# Patient Record
Sex: Male | Born: 1950 | Race: White | Hispanic: No | Marital: Married | Smoking: Former smoker
Health system: Southern US, Community
[De-identification: ages and names within clinical notes are randomized; demographics above are authoritative.]

## PROBLEM LIST (undated history)

## (undated) DIAGNOSIS — E039 Hypothyroidism, unspecified: Secondary | ICD-10-CM

## (undated) DIAGNOSIS — E7800 Pure hypercholesterolemia, unspecified: Secondary | ICD-10-CM

## (undated) DIAGNOSIS — I499 Cardiac arrhythmia, unspecified: Secondary | ICD-10-CM

## (undated) DIAGNOSIS — R209 Unspecified disturbances of skin sensation: Principal | ICD-10-CM

## (undated) DIAGNOSIS — Z9289 Personal history of other medical treatment: Secondary | ICD-10-CM

## (undated) DIAGNOSIS — G479 Sleep disorder, unspecified: Secondary | ICD-10-CM

## (undated) DIAGNOSIS — D649 Anemia, unspecified: Secondary | ICD-10-CM

## (undated) DIAGNOSIS — I10 Essential (primary) hypertension: Secondary | ICD-10-CM

## (undated) DIAGNOSIS — J449 Chronic obstructive pulmonary disease, unspecified: Secondary | ICD-10-CM

## (undated) DIAGNOSIS — I639 Cerebral infarction, unspecified: Secondary | ICD-10-CM

## (undated) HISTORY — PX: OTHER SURGICAL HISTORY: SHX169

## (undated) HISTORY — DX: Unspecified disturbances of skin sensation: R20.9

## (undated) HISTORY — PX: TONSILLECTOMY: SUR1361

## (undated) HISTORY — DX: Personal history of other medical treatment: Z92.89

## (undated) HISTORY — PX: INGUINAL HERNIA REPAIR: SUR1180

## (undated) HISTORY — DX: Pure hypercholesterolemia, unspecified: E78.00

## (undated) HISTORY — DX: Sleep disorder, unspecified: G47.9

---

## 2000-10-01 HISTORY — PX: OTHER SURGICAL HISTORY: SHX169

## 2000-11-04 ENCOUNTER — Inpatient Hospital Stay (HOSPITAL_COMMUNITY): Admission: EM | Admit: 2000-11-04 | Discharge: 2000-11-05 | Payer: Self-pay

## 2000-11-04 ENCOUNTER — Encounter: Payer: Self-pay | Admitting: Orthopedic Surgery

## 2000-11-04 ENCOUNTER — Encounter: Payer: Self-pay | Admitting: Emergency Medicine

## 2000-11-04 ENCOUNTER — Encounter: Payer: Self-pay | Admitting: General Surgery

## 2000-11-05 ENCOUNTER — Encounter: Payer: Self-pay | Admitting: Orthopedic Surgery

## 2001-01-24 ENCOUNTER — Encounter: Admission: RE | Admit: 2001-01-24 | Discharge: 2001-04-24 | Payer: Self-pay | Admitting: Orthopedic Surgery

## 2004-03-24 ENCOUNTER — Ambulatory Visit (HOSPITAL_COMMUNITY): Admission: RE | Admit: 2004-03-24 | Discharge: 2004-03-24 | Payer: Self-pay | Admitting: Gastroenterology

## 2004-05-03 ENCOUNTER — Encounter: Admission: RE | Admit: 2004-05-03 | Discharge: 2004-05-03 | Payer: Self-pay | Admitting: Gastroenterology

## 2004-07-13 ENCOUNTER — Ambulatory Visit (HOSPITAL_COMMUNITY): Admission: RE | Admit: 2004-07-13 | Discharge: 2004-07-13 | Payer: Self-pay | Admitting: General Surgery

## 2013-06-23 ENCOUNTER — Encounter: Payer: Self-pay | Admitting: Neurology

## 2013-06-24 ENCOUNTER — Ambulatory Visit (INDEPENDENT_AMBULATORY_CARE_PROVIDER_SITE_OTHER): Payer: BC Managed Care – HMO | Admitting: Neurology

## 2013-06-24 ENCOUNTER — Encounter: Payer: Self-pay | Admitting: Neurology

## 2013-06-24 VITALS — BP 141/92 | HR 63 | Ht 69.0 in | Wt 194.0 lb

## 2013-06-24 DIAGNOSIS — G459 Transient cerebral ischemic attack, unspecified: Secondary | ICD-10-CM

## 2013-06-24 DIAGNOSIS — G479 Sleep disorder, unspecified: Secondary | ICD-10-CM

## 2013-06-24 DIAGNOSIS — R209 Unspecified disturbances of skin sensation: Secondary | ICD-10-CM

## 2013-06-24 HISTORY — DX: Unspecified disturbances of skin sensation: R20.9

## 2013-06-24 HISTORY — DX: Sleep disorder, unspecified: G47.9

## 2013-06-24 NOTE — Progress Notes (Signed)
Reason for visit: Left hand numbness  Nicholas Camacho is a 62 y.o. male  History of present illness:  Nicholas Camacho is a 62 year old right-handed white male with a history of sudden onset of left hand clumsiness and numbness that occured approximately 4 weeks ago. The patient was at work at that time, carrying a canister in the left hand. The patient suddenly noted that the fourth and fifth fingers of the left hand became numb, and his hand became weak, and he dropped the canister. The patient noted that the fourth and fifth fingers were partially curled, and he could not straighten the fingers out completely. The patient denied any discomfort with this, and he has not had any numbness in the hand itself, only in the fingers. The patient denies any numbness of the forearm or arm. The patient denies any elbow pain, shoulder pain, or neck discomfort. The patient has not completely recovered, but much of the weakness and clumsiness has improved. The patient denied any other associated symptoms such as headache, visual disturbance, slurred speech, or numbness of the body or leg. The patient has had multiple episodes over the last 6 months of pulling sensations of the left side of face, and dizziness, and blurring of vision. These episodes may last 2 minutes and then clear. One of the episodes was witnessed by his wife, who noted definite facial asymmetry during the event. The patient may average 2 episodes a month. The patient is sent to this office for an evaluation. The patient has recently been placed on aspirin and a cholesterol lowering agent.  As a side note, the patient has also had a one-year history of increasing problems with excessive daytime drowsiness. The patient is unable to remain awake for long when he is inactive. The patient cannot operate a motor vehicle for long distance driving because of drowsiness. The wife has noted that he will snore at night. Although no definite apneic  episodes have been seen, the patient has been noted to have episodes of jerking at night during periods of time when his snoring stops. The snoring will then start again after the jerking episode. The excessive daytime drowsiness is worsening over time.  Past Medical History  Diagnosis Date  . Elevated cholesterol   . Disturbance of skin sensation 06/24/2013  . Sleep disturbance, unspecified 06/24/2013    Past Surgical History  Procedure Laterality Date  . Fractured pelvis    . Reconstruct left heel  2002  . Tonsillectomy    . Inguinal hernia repair Bilateral     Family History  Problem Relation Age of Onset  . Diabetes Mother   . Prostate cancer Father     Social history:  reports that he has been smoking.  He uses smokeless tobacco. He reports that he does not drink alcohol or use illicit drugs.  Medications:  Current Outpatient Prescriptions on File Prior to Visit  Medication Sig Dispense Refill  . aspirin 325 MG EC tablet Take 325 mg by mouth daily.      . Racepinephrine HCl (ASTHMANEFRIN REFILL) 2.25 % NEBU nebulizer solution Take 0.5 mLs by nebulization every 4 (four) hours as needed.       No current facility-administered medications on file prior to visit.      Allergies  Allergen Reactions  . Penicillins     Unknown   . Morphine And Related     HALLUCINATIONS    ROS:  Out of a complete 14 system review of symptoms, the  patient complains only of the following symptoms, and all other reviewed systems are negative.  Dizziness Shortness of breath, cough, wheezing, snoring Numbness  Blood pressure 141/92, pulse 63, height 5\' 9"  (1.753 m), weight 194 lb (87.998 kg).  Physical Exam  General: The patient is alert and cooperative at the time of the examination.  Head: Pupils are equal, round, and reactive to light. Discs are flat bilaterally.  Neck: The neck is supple, no carotid bruits are noted.  Respiratory: The respiratory examination is  clear.  Cardiovascular: The cardiovascular examination reveals a regular rate and rhythm, no obvious murmurs or rubs are noted.  Skin: Extremities are without significant edema.  Neurologic Exam  Mental status:  Cranial nerves: Facial symmetry is present. There is good sensation of the face to pinprick and soft touch bilaterally. The strength of the facial muscles and the muscles to head turning and shoulder shrug are normal bilaterally. Speech is well enunciated, no aphasia or dysarthria is noted. Extraocular movements are full. Visual fields are full.  Motor: The motor testing reveals 5 over 5 strength of all 4 extremities. Good symmetric motor tone is noted throughout.  Sensory: Sensory testing is intact to pinprick, soft touch, vibration sensation, and position sense on all 4 extremities. No evidence of extinction is noted.  Coordination: Cerebellar testing reveals good finger-nose-finger and heel-to-shin bilaterally.  Gait and station: Gait is normal. Tandem gait is normal. Romberg is negative. No drift is seen.  Reflexes: Deep tendon reflexes are symmetric and normal bilaterally. Toes are downgoing bilaterally.   Assessment/Plan:  1. Left hand numbness, rule out stroke event  2. Excessive daytime drowsiness  The patient had sudden onset of numbness and clumsiness of the left hand. Clearly, the pattern of numbness and clumsiness could be related to a left ulnar neuropathy, but the patient does have risk factors for stroke including dyslipidemia and smoking. The patient has also had episodes of left facial asymmetry, dizziness, and slurred speech. This in combination with the onset of the left arm deficit could be consistent with cerebrovascular disease. The patient will be set up for MRI of the brain, MRA of the head, and a carotid Doppler study. The patient will remain on aspirin. If these studies are unremarkable, the patient will be set up for EMG and nerve conduction study  involving the left arm. Given the history of increasing excessive daytime drowsiness, the patient will be referred for a sleep evaluation. The patient will followup if needed, and our office will contact him concerning the results of the studies above.  Marlan Palau MD 06/24/2013 9:01 PM  [facility] [address] [place], [place] [address]  Phone [phone] Fax [phone]

## 2013-07-02 ENCOUNTER — Ambulatory Visit (INDEPENDENT_AMBULATORY_CARE_PROVIDER_SITE_OTHER): Payer: BC Managed Care – HMO

## 2013-07-02 DIAGNOSIS — G459 Transient cerebral ischemic attack, unspecified: Secondary | ICD-10-CM

## 2013-07-02 DIAGNOSIS — R209 Unspecified disturbances of skin sensation: Secondary | ICD-10-CM

## 2013-07-02 DIAGNOSIS — G479 Sleep disorder, unspecified: Secondary | ICD-10-CM

## 2013-07-08 ENCOUNTER — Telehealth: Payer: Self-pay | Admitting: Neurology

## 2013-07-08 NOTE — Telephone Encounter (Signed)
I called patient. The carotid Doppler study was unremarkable. 

## 2013-07-09 ENCOUNTER — Ambulatory Visit (INDEPENDENT_AMBULATORY_CARE_PROVIDER_SITE_OTHER): Payer: BC Managed Care – HMO

## 2013-07-09 DIAGNOSIS — G459 Transient cerebral ischemic attack, unspecified: Secondary | ICD-10-CM

## 2013-07-09 DIAGNOSIS — G479 Sleep disorder, unspecified: Secondary | ICD-10-CM

## 2013-07-09 DIAGNOSIS — R209 Unspecified disturbances of skin sensation: Secondary | ICD-10-CM

## 2013-07-10 ENCOUNTER — Telehealth: Payer: Self-pay | Admitting: Neurology

## 2013-07-10 DIAGNOSIS — R29898 Other symptoms and signs involving the musculoskeletal system: Secondary | ICD-10-CM

## 2013-07-10 NOTE — Telephone Encounter (Signed)
I called patient. 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 MRA of the head was unremarkable. I will get nerve conduction studies set up on both arms, EMG of the left arm.

## 2013-07-14 ENCOUNTER — Telehealth: Payer: Self-pay | Admitting: Neurology

## 2013-07-14 DIAGNOSIS — Z8673 Personal history of transient ischemic attack (TIA), and cerebral infarction without residual deficits: Secondary | ICD-10-CM

## 2013-07-14 DIAGNOSIS — E669 Obesity, unspecified: Secondary | ICD-10-CM

## 2013-07-14 DIAGNOSIS — R400 Somnolence: Secondary | ICD-10-CM

## 2013-07-14 DIAGNOSIS — R0683 Snoring: Secondary | ICD-10-CM

## 2013-07-14 NOTE — Telephone Encounter (Signed)
Dr. Frances Furbish the Epworth score is 16. The sleep review is attached to the documentation below.

## 2013-07-14 NOTE — Telephone Encounter (Signed)
refers patient for attended sleep study: Dr. Anne Hahn  Height: 5'9  Weight: 194 lb  BMI: 28.64  Past Medical History: Elevated cholesterol ,Disturbance of skin sensation Sleep disturbance, unspecified    Sleep Symptoms:  The patient denied any other associated symptoms such as headache, visual disturbance, slurred speech, or numbness of the body or leg. The patient has had multiple episodes over the last 6 months of pulling sensations of the left side of face, and dizziness, and blurring of vision. These episodes may last 2 minutes and then clear. One of the episodes was witnessed by his wife, who noted definite facial asymmetry during the event. The patient may average 2 episodes a month. The patient is sent to this office for an evaluation. The patient has recently been placed on aspirin and a cholesterol lowering agent.  As a side note, the patient has also had a one-year history of increasing problems with excessive daytime drowsiness. The patient is unable to remain awake for long when he is inactive. The patient cannot operate a motor vehicle for long distance driving because of drowsiness. The wife has noted that he will snore at night. Although no definite apneic episodes have been seen, the patient has been noted to have episodes of jerking at night during periods of time when his snoring stops. The snoring will then start again after the jerking episode. The excessive daytime drowsiness is worsening over time.   Epworth Score:  Medications: Aspirin (Tablet Delayed Response) aspirin 325 MG Take 325 mg by mouth daily. Atorvastatin Calcium (Tab) LIPITOR 10 MG Take 10 mg by mouth daily. Racepinephrine HCl (Nebu Soln) Racepinephrine HCl 2.25 % Take 0.5 mLs by nebulization every 4 (four) hours as needed. .    Insurance: [employer]  Please review patient information and submit instructions for scheduling and orders for sleep technologist.  Thank you!

## 2013-07-14 NOTE — Telephone Encounter (Signed)
After reviewing the sleep study referral, I entered a split night sleep study request on this patient, thanks.  Glendale Youngblood, MD, PhD [facility] ([facility])    

## 2013-07-15 NOTE — Telephone Encounter (Signed)
I called and spoke with the patient and he has agreed to be scheduled for November 5@9 :30pm.

## 2013-08-05 ENCOUNTER — Ambulatory Visit (INDEPENDENT_AMBULATORY_CARE_PROVIDER_SITE_OTHER): Payer: BC Managed Care – HMO

## 2013-08-05 VITALS — BP 126/82 | HR 65 | Ht 70.0 in | Wt 194.0 lb

## 2013-08-05 DIAGNOSIS — R0683 Snoring: Secondary | ICD-10-CM

## 2013-08-05 DIAGNOSIS — Z8673 Personal history of transient ischemic attack (TIA), and cerebral infarction without residual deficits: Secondary | ICD-10-CM

## 2013-08-05 DIAGNOSIS — G4736 Sleep related hypoventilation in conditions classified elsewhere: Secondary | ICD-10-CM

## 2013-08-05 DIAGNOSIS — R9431 Abnormal electrocardiogram [ECG] [EKG]: Secondary | ICD-10-CM

## 2013-08-05 DIAGNOSIS — E669 Obesity, unspecified: Secondary | ICD-10-CM

## 2013-08-05 DIAGNOSIS — G4761 Periodic limb movement disorder: Secondary | ICD-10-CM

## 2013-08-05 DIAGNOSIS — R400 Somnolence: Secondary | ICD-10-CM

## 2013-08-05 DIAGNOSIS — G479 Sleep disorder, unspecified: Secondary | ICD-10-CM

## 2013-08-05 DIAGNOSIS — G4733 Obstructive sleep apnea (adult) (pediatric): Secondary | ICD-10-CM

## 2013-08-06 ENCOUNTER — Encounter (INDEPENDENT_AMBULATORY_CARE_PROVIDER_SITE_OTHER): Payer: BC Managed Care – HMO

## 2013-08-06 ENCOUNTER — Ambulatory Visit (INDEPENDENT_AMBULATORY_CARE_PROVIDER_SITE_OTHER): Payer: BC Managed Care – HMO | Admitting: Neurology

## 2013-08-06 DIAGNOSIS — R29898 Other symptoms and signs involving the musculoskeletal system: Secondary | ICD-10-CM

## 2013-08-06 DIAGNOSIS — G5602 Carpal tunnel syndrome, left upper limb: Secondary | ICD-10-CM

## 2013-08-06 DIAGNOSIS — G5601 Carpal tunnel syndrome, right upper limb: Secondary | ICD-10-CM

## 2013-08-06 DIAGNOSIS — G5600 Carpal tunnel syndrome, unspecified upper limb: Secondary | ICD-10-CM

## 2013-08-06 DIAGNOSIS — Z0289 Encounter for other administrative examinations: Secondary | ICD-10-CM

## 2013-08-06 NOTE — Progress Notes (Signed)
Nicholas Camacho is a 62 year old gentleman with a history of onset of left hand numbness and weakness that came on suddenly while at work around the beginning of September 2014. Since that time, he has had gradual improvement of his motor and sensory deficits. MRI the brain shows minimal small vessel disease, no acute changes were seen. MRA of the brain was unremarkable. Carotid Doppler studies were unremarkable. The patient is on low-dose aspirin, 325 mg daily.  The patient comes in today for nerve conduction studies on both arms, EMG on the left arm. This reveals evidence of bilateral carpal tunnel syndromes that are mild. EMG of the left arm confirms the presence of carpal tunnel syndrome, without evidence of an overlying cervical radiculopathy. No evidence of an ulnar neuropathy is seen.  The patient is doing better at this point, is almost normal with his symptoms. The patient has good strength in the left upper extremity.  The patient will followup through this office if needed. The etiology of the onset of numbness and weakness is not clear, it is possible that a small acute cerebral stroke may have been missed. The patient will remain on aspirin.

## 2013-08-06 NOTE — Procedures (Signed)
  HISTORY:  Nicholas Camacho is a 62 year old gentleman with sudden onset of left hand weakness and numbness that began in early September 2014. The patient believes that he is slowly gaining improvement with the left hand. The patient is almost back to normal this point with exception of some numbness of the left fifth finger. MRI of the brain did not show evidence of an acute stroke. The patient is being evaluated for a possible neuropathy or cervical radiculopathy.  NERVE CONDUCTION STUDIES:  Nerve conduction studies were performed on both upper extremities. The distal motor latencies for the median nerves were prolonged bilaterally, with normal motor amplitudes for those nerves. The distal motor latencies and motor amplitudes for the ulnar nerves were normal bilaterally. The F wave latencies and nerve conduction velocities for the median and ulnar nerves were normal bilaterally, with the exception that the left median F wave latency is very minimally prolonged. The sensory latencies for the median nerves were prolonged bilaterally, normal for the ulnar nerves bilaterally.  EMG STUDIES:  EMG study was performed on the left upper extremity:  The first dorsal interosseous muscle reveals 2 to 4 K units with full recruitment. No fibrillations or positive waves were noted. The abductor pollicis brevis muscle reveals 2 to 5 K units with decreased recruitment. No fibrillations or positive waves were noted. The extensor indicis proprius muscle reveals 1 to 3 K units with full recruitment. No fibrillations or positive waves were noted. The pronator teres muscle reveals 2 to 3 K units with full recruitment. No fibrillations or positive waves were noted. The biceps muscle reveals 1 to 2 K units with full recruitment. No fibrillations or positive waves were noted. The triceps muscle reveals 2 to 4 K units with full recruitment. No fibrillations or positive waves were noted. The anterior deltoid muscle  reveals 2 to 3 K units with full recruitment. No fibrillations or positive waves were noted. The cervical paraspinal muscles were tested at 2 levels. No abnormalities of insertional activity were seen at either level tested. There was good relaxation.   IMPRESSION:  Nerve conduction studies done on both upper extremities shows evidence of bilateral mild carpal tunnel syndromes. EMG evaluation of the left upper extremity shows mild chronic stable denervation in the APB muscle consistent with the diagnosis of carpal tunnel syndrome. There is no evidence of an overlying cervical radiculopathy.  Marlan Palau MD 08/06/2013 1:39 PM  [facility] [address] [place], [place] [address]  Phone [phone] Fax [phone]

## 2013-08-07 ENCOUNTER — Telehealth: Payer: Self-pay | Admitting: Neurology

## 2013-08-07 ENCOUNTER — Encounter: Payer: Self-pay | Admitting: *Deleted

## 2013-08-07 DIAGNOSIS — G4736 Sleep related hypoventilation in conditions classified elsewhere: Secondary | ICD-10-CM

## 2013-08-07 DIAGNOSIS — F17200 Nicotine dependence, unspecified, uncomplicated: Secondary | ICD-10-CM

## 2013-08-07 DIAGNOSIS — I472 Ventricular tachycardia: Secondary | ICD-10-CM

## 2013-08-07 NOTE — Telephone Encounter (Signed)
Left message for patient regarding sleep study results, asked patient to call me back to discuss results and have questions answered.  Explained that a copy of the sleep study was sent to referring physician and copy of study is coming to them in the mail.  Stated on mobile voicemail that Dr. Frances Furbish needed to see patient in follow up to discuss some of what was seen during the study (PLMD).  Also, stated that she was referring patient for cardiac and pulmonary evaluation to follow up on a couple of things.  Asked pt to call back to discuss and make appt.  Explained I would get started on referrals in the meantime.

## 2013-08-07 NOTE — Telephone Encounter (Signed)
Please call and notify the patient that the recent sleep study did not show any significant obstructive sleep apnea. However, he had low oxygen values at times and a irregularity in his EKG for which I would recommend: Cardiac evaluation and Pulmonary evaluation. I have entered a referral to cardiology and pulmonology, and would like to go over the details of the study during an appointment. Please arrange sleep consultation with me as well as initiate referrals and advise pt. Also, route or fax report to PCP and referring MD, if other than PCP.  Once you have spoken to patient, you can close this encounter.   Thanks,  Huston Foley, MD, PhD [facility])

## 2013-08-07 NOTE — Sleep Study (Signed)
See media tab for full report  

## 2013-08-12 ENCOUNTER — Encounter: Payer: Self-pay | Admitting: Neurology

## 2013-08-12 ENCOUNTER — Encounter (INDEPENDENT_AMBULATORY_CARE_PROVIDER_SITE_OTHER): Payer: Self-pay

## 2013-08-12 ENCOUNTER — Ambulatory Visit (INDEPENDENT_AMBULATORY_CARE_PROVIDER_SITE_OTHER): Payer: BC Managed Care – HMO | Admitting: Neurology

## 2013-08-12 VITALS — BP 133/80 | HR 69 | Temp 97.3°F | Ht 70.0 in | Wt 194.0 lb

## 2013-08-12 DIAGNOSIS — G479 Sleep disorder, unspecified: Secondary | ICD-10-CM

## 2013-08-12 DIAGNOSIS — F17200 Nicotine dependence, unspecified, uncomplicated: Secondary | ICD-10-CM

## 2013-08-12 DIAGNOSIS — I472 Ventricular tachycardia: Secondary | ICD-10-CM

## 2013-08-12 DIAGNOSIS — G4761 Periodic limb movement disorder: Secondary | ICD-10-CM

## 2013-08-12 DIAGNOSIS — G4736 Sleep related hypoventilation in conditions classified elsewhere: Secondary | ICD-10-CM

## 2013-08-12 NOTE — Patient Instructions (Addendum)
I can see you back as needed. If you have restless legs symptoms or new sleep related issues. Your actual obstructive sleep apnea is overall borderline. Please sleep off your back and try to lose.   Remember to drink plenty of fluid, eat healthy meals and do not skip any meals. Try to eat protein with a every meal and eat a healthy snack such as fruit or nuts in between meals. Try to keep a regular sleep-wake schedule and try to exercise daily, particularly in the form of walking, 20-30 minutes a day, if you can.   Please keep your upcoming appointments with pulmonary and cardiology.   As far as your medications are concerned, I would like to suggest no changes.   Please STOP SMOKING.

## 2013-08-12 NOTE — Progress Notes (Signed)
Subjective:    Patient ID: Nicholas Camacho is a 62 y.o. male.  HPI  Huston Foley, MD, PhD [facility] [address] [address] [place], [place] [address]  Dear Mellody Dance,  I saw your patient, Nicholas Camacho, upon your kind request in my sleep clinic today for initial consultation of his sleep disorder, in particular to discuss his recent sleep study findings. The patient is accompanied by his wife today. As you know, Nicholas Camacho is a very pleasant 62 year old right-handed gentleman with an underlying medical history of smoking, hyperlipidemia and chronic lung disease, who recently had a sleep study which I interpreted. He reports a history of snoring and daytime somnolence. He had seen you for intermittent left hand numbness, tingling and weakness. EMG and nerve conduction studies showed evidence of mild bilateral carpal tunnel syndrome. He had a baseline sleep study on 08/05/2013 and I went over his test results with him in detail today. His sleep efficiency was reduced at 85.7% with a latency to sleep of 22 minutes. Wake after sleep onset was 39.5 minutes with moderate sleep fragmentation noted. He had an increased percentage of stage I and stage II sleep, 10.2% of slow-wave sleep and the a normal percentage of REM sleep at 19.6 with a mildly reduced REM latency of 50.5 minutes. He had severe periodic leg movements at 64.6 per hour resulting and 4.9 arousals per hour. He had occasional PACs and one 9 beat run of ventricular tachycardia. He had mild intermittent snoring periods his total AHI was 3.9 per hour, rising to 11.6 per hour in rem sleep and 8 per hour in the supine position. His baseline oxygen saturation was noted to be only 90% with a nadir of 80% during REM sleep. He spent 4 hours and 1 minute below the saturation of 90% for the night.  His typical bedtime is reported to be around 1 AM and usual wake time is around 6 AM. Sleep onset typically occurs  within a few minutes. He reports feeling marginally rested upon awakening. He reports excessive daytime somnolence. The patient has been taking a scheduled nap, which is usually 2 hours long and in the evening or late afternoon.  He has been known to snore for the past few years. Snoring is reportedly mild, and associated with shallow breathing at times. The patient has not noted any RLS symptoms and is known to kick while asleep. There is no family history of RLS or OSA. He denies cataplexy, sleep paralysis, hypnagogic or hypnopompic hallucinations, or sleep attacks. He does not report any vivid dreams, nightmares, dream enactments, or parasomnias, such as sleep talking or sleep walking. He smokes about 2 ppd.   His Past Medical History Is Significant For: Past Medical History  Diagnosis Date  . Elevated cholesterol   . Disturbance of skin sensation 06/24/2013  . Sleep disturbance, unspecified 06/24/2013    His Past Surgical History Is Significant For: Past Surgical History  Procedure Laterality Date  . Fractured pelvis    . Reconstruct left heel  2002  . Tonsillectomy    . Inguinal hernia repair Bilateral     His Family History Is Significant For: Family History  Problem Relation Age of Onset  . Diabetes Mother   . Prostate cancer Father     His Social History Is Significant For: History   Social History  . Marital Status: Married    Spouse Name: N/A    Number of Children: 2  . Years of  Education: COLLEGE   Occupational History  . pest control    Social History Main Topics  . Smoking status: Current Every Day Smoker -- 1.50 packs/day  . Smokeless tobacco: Current User  . Alcohol Use: No  . Drug Use: No  . Sexual Activity: None   Other Topics Concern  . None   Social History Narrative  . None    His Allergies Are:  Allergies  Allergen Reactions  . Morphine And Related     HALLUCINATIONS  :   His Current Medications Are:  Outpatient Encounter Prescriptions  as of 08/12/2013  Medication Sig  . aspirin 325 MG EC tablet Take 325 mg by mouth daily.  [name] atorvastatin (LIPITOR) 10 MG tablet Take 10 mg by mouth daily.  [name] lisinopril (PRINIVIL,ZESTRIL) 10 MG tablet Take 1 tablet by mouth daily.  . Racepinephrine HCl (ASTHMANEFRIN REFILL) 2.25 % NEBU nebulizer solution Take 0.5 mLs by nebulization every 4 (four) hours as needed.   Review of Systems:  Out of a complete 14 point review of systems, all are reviewed and negative with the exception of these symptoms as listed below:  Review of Systems  Constitutional: Negative.   HENT: Negative.   Eyes: Negative.   Respiratory: Negative.   Cardiovascular: Negative.   Gastrointestinal: Negative.   Endocrine: Negative.   Genitourinary: Negative.   Allergic/Immunologic: Negative.   Neurological: Negative.   Hematological: Negative.   Psychiatric/Behavioral: Positive for sleep disturbance.    Objective:  Neurologic Exam  Physical Exam Physical Examination:   Filed Vitals:   08/12/13 1105  BP: 133/80  Pulse: 69  Temp: 97.3 F (36.3 C)    General Examination: The patient is a very pleasant 62 y.o. male in no acute distress. He appears well-developed and well-nourished and well groomed. He has abdominal obesity.  HEENT: Normocephalic, atraumatic, pupils are equal, round and reactive to light and accommodation. Funduscopic exam is normal with sharp disc margins noted. Extraocular tracking is good without limitation to gaze excursion or nystagmus noted. Normal smooth pursuit is noted. Hearing is grossly intact. Tympanic membranes are clear bilaterally. Face is symmetric with normal facial animation and normal facial sensation. Speech is clear with no dysarthria noted. There is no hypophonia. There is no lip, neck/head, jaw or voice tremor. Neck is supple with full range of passive and active motion. There are no carotid bruits on auscultation. Oropharynx exam reveals: mild mouth dryness, dentures in  place and mild airway crowding. Mallampati is class I. Tongue protrudes centrally and palate elevates symmetrically. Tonsils are absent.    Chest: Clear to auscultation without wheezing, rhonchi or crackles noted. He has several shorter bouts of cough during this visit.  Heart: S1+S2+0, regular and normal without murmurs, rubs or gallops noted.   Abdomen: Soft, non-tender and non-distended with normal bowel sounds appreciated on auscultation.  Extremities: There is no pitting edema in the distal lower extremities bilaterally. Pedal pulses are intact.  Skin: Warm and dry without trophic changes noted. There are no varicose veins.  Musculoskeletal: exam reveals no obvious joint deformities, tenderness or joint swelling or erythema.   Neurologically:  Mental status: The patient is awake, alert and oriented in all 4 spheres. His memory, attention, language and knowledge are appropriate. There is no aphasia, agnosia, apraxia or anomia. Speech is clear with normal prosody and enunciation. Thought process is linear. Mood is congruent and affect is normal.  Cranial nerves are as described above under HEENT exam. In addition, shoulder shrug is normal with  equal shoulder height noted. Motor exam: Normal bulk, strength and tone is noted. There is no drift, tremor or rebound. Romberg is negative. Reflexes are 2+ throughout. Fine motor skills are intact with normal finger taps, normal hand movements, normal rapid alternating patting, normal foot taps and normal foot agility.  Cerebellar testing shows no dysmetria or intention tremor on finger to nose testing. Heel to shin is unremarkable bilaterally. There is no truncal or gait ataxia.  Sensory exam is intact to light touch, pinprick, vibration, temperature sense in the upper and lower extremities; he reports tingling in his left digit 5.  Gait, station and balance are unremarkable. No veering to one side is noted. No leaning to one side is noted. Posture is  age-appropriate and stance is narrow based. No problems turning are noted. He turns en bloc.                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 He had a short run of V tach and severe PLMs with very mild PLM associated arousals noted. He denies RLS Sx. He was noted to have several bouts of cough during the night of his sleep study and during this visit. His physical exam is non-focal. From the sleep apnea standpoint I have asked him to sleep off his back and try to lose some weight. Furthermore, since he does not have any restless leg symptoms to speak of and his PLM associated arousal index was lower than 5 per hour I suggested that we not initiate any treatment for PLMD. I would like for him to see a pulmonologist for concern for underlying lung disease and this may in fact be the reason for his hypoxemia and sleep. In addition I have requested that he see a cardiologist for abnormalities seen on his EKG and including a short run of V. tach. He had no cardiac symptoms at the time of a sleep study and does not endorse much in the way of cardiac related symptoms. He does have coughing with exertion including climbing stairs. His biggest cardiovascular risk factor is his smoking and I have advised him very strongly to quit smoking. At this juncture from my end of things I will see him back on an as-needed basis. He and his wife were in agreement. Thank you very much for allowing me to participate in the care of this nice patient. If I can be of any further assistance to you please do not hesitate to call me at [phone].  Sincerely,   Huston Foley, MD, PhD

## 2013-09-11 ENCOUNTER — Encounter: Payer: Self-pay | Admitting: *Deleted

## 2013-09-15 ENCOUNTER — Ambulatory Visit (INDEPENDENT_AMBULATORY_CARE_PROVIDER_SITE_OTHER): Payer: BC Managed Care – PPO | Admitting: Pulmonary Disease

## 2013-09-15 ENCOUNTER — Encounter: Payer: Self-pay | Admitting: Pulmonary Disease

## 2013-09-15 ENCOUNTER — Ambulatory Visit (INDEPENDENT_AMBULATORY_CARE_PROVIDER_SITE_OTHER)
Admission: RE | Admit: 2013-09-15 | Discharge: 2013-09-15 | Disposition: A | Discharge status: Home / Self Care | Payer: BC Managed Care – PPO | Source: Ambulatory Visit | Attending: Pulmonary Disease | Admitting: Pulmonary Disease

## 2013-09-15 ENCOUNTER — Encounter: Payer: Self-pay | Admitting: Cardiology

## 2013-09-15 ENCOUNTER — Ambulatory Visit (INDEPENDENT_AMBULATORY_CARE_PROVIDER_SITE_OTHER): Payer: BC Managed Care – PPO | Admitting: Cardiology

## 2013-09-15 VITALS — BP 132/82 | HR 70 | Temp 97.9°F | Ht 70.0 in | Wt 201.4 lb

## 2013-09-15 VITALS — BP 124/84 | HR 68 | Ht 70.0 in | Wt 197.0 lb

## 2013-09-15 DIAGNOSIS — I10 Essential (primary) hypertension: Secondary | ICD-10-CM

## 2013-09-15 DIAGNOSIS — I4891 Unspecified atrial fibrillation: Secondary | ICD-10-CM

## 2013-09-15 DIAGNOSIS — G459 Transient cerebral ischemic attack, unspecified: Secondary | ICD-10-CM

## 2013-09-15 DIAGNOSIS — J449 Chronic obstructive pulmonary disease, unspecified: Secondary | ICD-10-CM

## 2013-09-15 DIAGNOSIS — E785 Hyperlipidemia, unspecified: Secondary | ICD-10-CM

## 2013-09-15 DIAGNOSIS — I639 Cerebral infarction, unspecified: Secondary | ICD-10-CM

## 2013-09-15 DIAGNOSIS — I6350 Cerebral infarction due to unspecified occlusion or stenosis of unspecified cerebral artery: Secondary | ICD-10-CM

## 2013-09-15 MED ORDER — ALBUTEROL SULFATE HFA 108 (90 BASE) MCG/ACT IN AERS: 2.0000 | INHALATION_SPRAY | Freq: Four times a day (QID) | RESPIRATORY_TRACT | Status: DC | PRN

## 2013-09-15 MED ORDER — FLUTICASONE-SALMETEROL 250-50 MCG/DOSE IN AEPB: 2.0000 | INHALATION_SPRAY | Freq: Two times a day (BID) | RESPIRATORY_TRACT | Status: DC

## 2013-09-15 NOTE — Progress Notes (Signed)
   Subjective:    Patient ID: Nicholas Camacho, male    DOB: 17-Dec-1950, 62 y.o.   MRN: [id number]  HPI The patient is a 62 year old male who I've been asked to see for nocturnal hypoxemia. He recently underwent a sleep study that showed no significant sleep apnea, but he did have oxygen desaturation as low as 80%. He spent 4 hours during the night less than 90%, and 1.5 hours less than 88%. He has a long history of tobacco abuse, and continues to do so. He describes a 5 block dyspnea on exertion at a moderate pace on flat ground. He will get short of breath walking up one flight of stairs, but has no issues bringing groceries in from the car. He has a chronic dry cough, but does produce white foamy mucus in the mornings. He denies any significant flareups of his lung disease, but does note worsening shortness of breath during the high heat and humidity of the summer. He has not had a recent chest x-ray, and has never had pulmonary function studies.   Review of Systems  Constitutional: Negative for fever and unexpected weight change.  HENT: Positive for sinus pressure. Negative for congestion, dental problem, ear pain, nosebleeds, postnasal drip, rhinorrhea, sneezing, sore throat and trouble swallowing.   Eyes: Negative for redness and itching.  Respiratory: Positive for cough and shortness of breath. Negative for chest tightness and wheezing.   Cardiovascular: Negative for palpitations and leg swelling.  Gastrointestinal: Negative for nausea and vomiting.  Genitourinary: Negative for dysuria.  Musculoskeletal: Negative for joint swelling.  Skin: Negative for rash.  Neurological: Negative for headaches.  Hematological: Does not bruise/bleed easily.  Psychiatric/Behavioral: Negative for dysphoric mood. The patient is not nervous/anxious.        Objective:   Physical Exam Constitutional:  Well developed, no acute distress  HENT:  Nares patent without discharge  Oropharynx without exudate,  palate and uvula are normal  Eyes:  Perrla, eomi, no scleral icterus  Neck:  No JVD, no TMG  Cardiovascular:  Normal rate, regular rhythm, no rubs or gallops.  No murmurs        Intact distal pulses  Pulmonary :  decreased breath sounds, no stridor or respiratory distress   No rales, or wheezing.  +rhonchi  Abdominal:  Soft, nondistended, bowel sounds present.  No tenderness noted.   Musculoskeletal:  No lower extremity edema noted.  Lymph Nodes:  No cervical lymphadenopathy noted  Skin:  No cyanosis noted  Neurologic:  Alert, appropriate, moves all 4 extremities without obvious deficit.         Assessment & Plan:

## 2013-09-15 NOTE — Assessment & Plan Note (Signed)
The patient's history and exam is most consistent with significant COPD. It is unclear how much this may be related to airway inflammation from his ongoing smoking, versus true emphysema. He will obviously need full PFTs for evaluation, and as well as a chest x-ray. I would like to start him on a maintenance bronchodilator while he is trying to work on smoking cessation, and will see him back in 4 weeks with pulmonary function studies. I have also discussed his nocturnal hypoxemia with him, and have recommended starting oxygen at night. The patient is hesitant to do this, and I made a deal with him to give him 4-8 weeks on treatment and smoking cessation to see if things improve.

## 2013-09-15 NOTE — Progress Notes (Signed)
Patient ID: LINDBERGH WINKLES, male   DOB: 02-Apr-1951, 63 y.o.   MRN: [id number]    Patient Name: DALBERT STILLINGS Date of Encounter: 09/15/2013  Primary Care Provider:  Farris Has, MD Primary Cardiologist:  Tobias Alexander, H  Problem List   Past Medical History  Diagnosis Date  . Elevated cholesterol   . Disturbance of skin sensation 06/24/2013  . Sleep disturbance, unspecified 06/24/2013   Past Surgical History  Procedure Laterality Date  . Fractured pelvis    . Reconstruct left heel  2002  . Tonsillectomy    . Inguinal hernia repair Bilateral     Allergies  Allergies  Allergen Reactions  . Morphine And Related     HALLUCINATIONS    HPI  A 62 year old male with h/o hypertension, hyperlipidemia, COPD and recent intermittent left hand numbness, tingling and weakness with ? TIA. CT and MRI of the brain were normal.EMG and nerve conduction studies showed evidence of mild bilateral carpal tunnel syndrome. He was also evaluated for OSA and was told that he had a short run of atrial fibrillation. The patient is not aware of palpitations and denies any prior syncope, CP or SOB. He is ongoing smoker.  Home Medications  Prior to Admission medications   Medication Sig Start Date End Date Taking? Authorizing Provider  aspirin 325 MG EC tablet Take 325 mg by mouth daily.   Yes Historical Provider, MD  lisinopril (PRINIVIL,ZESTRIL) 10 MG tablet Take 1 tablet by mouth daily. 07/28/13  Yes Historical Provider, MD  Racepinephrine HCl (ASTHMANEFRIN REFILL) 2.25 % NEBU nebulizer solution Take 0.5 mLs by nebulization every 4 (four) hours as needed.   Yes Historical Provider, MD  atorvastatin (LIPITOR) 10 MG tablet Take 10 mg by mouth daily. 06/12/13   Historical Provider, MD    Family History  Family History  Problem Relation Age of Onset  . Diabetes Mother   . Prostate cancer Father   . Kidney failure Father   . Alcoholism Sister     history of  . Drug abuse Sister    history of    Social History  History   Social History  . Marital Status: Married    Spouse Name: N/A    Number of Children: 2  . Years of Education: COLLEGE   Occupational History  . pest control    Social History Main Topics  . Smoking status: Current Every Day Smoker -- 1.50 packs/day  . Smokeless tobacco: Current User  . Alcohol Use: No  . Drug Use: No  . Sexual Activity: Not on file   Other Topics Concern  . Not on file   Social History Narrative  . No narrative on file     Review of Systems, as per HPI, otherwise negative General:  No chills, fever, night sweats or weight changes.  Cardiovascular:  No chest pain, dyspnea on exertion, edema, orthopnea, palpitations, paroxysmal nocturnal dyspnea. Dermatological: No rash, lesions/masses Respiratory: No cough, dyspnea Urologic: No hematuria, dysuria Abdominal:   No nausea, vomiting, diarrhea, bright red blood per rectum, melena, or hematemesis Neurologic:  No visual changes, wkns, changes in mental status. All other systems reviewed and are otherwise negative except as noted above.  Physical Exam  Blood pressure 124/84, pulse 68, height 5\' 10"  (1.778 m), weight 197 lb (89.359 kg).  General: Pleasant, NAD Psych: Normal affect. Neuro: Alert and oriented X 3. Moves all extremities spontaneously. HEENT: Normal  Neck: Supple without bruits or JVD. Lungs:  Resp regular and  unlabored, CTA. Heart: RRR no s3, s4, or murmurs. Abdomen: Soft, non-tender, non-distended, BS + x 4.  Extremities: No clubbing, cyanosis or edema. DP/PT/Radials 2+ and equal bilaterally.  Labs:  No results found for this basename: CKTOTAL, CKMB, TROPONINI,  in the last 72 hours No results found for this basename: WBC, HGB, HCT, MCV, PLT   No results found for this basename: NA, K, CL, CO2, BUN, CREATININE, CALCIUM, LABALBU, PROT, BILITOT, ALKPHOS, ALT, AST, GLUCOSE,  in the last 168 hours No results found for this basename: CHOL, HDL, LDLCALC,  TRIG   No results found for this basename: DDIMER   No components found with this basename: POCBNP,   Accessory Clinical Findings  echocardiogram  ECG - normal sinus rhythm, rightward axis, incomplete right bundle branch block, borderline EKG   Assessment & Plan  A 62 year male   1. Upper extremity tingling, weakness - ? TIA, normal MRI, nsVT found incidentally during sleep study. We will start a 48 Hour Holter monitor to evaluate for possible a-fib, and order an echocardiogram (including bubble study) to evaluate for LA size and LV function.  If Holter monitor negative we will consider a TEE and plan for implantable loop recorder.   2. COPD with active wheezing - the patient is currently only on rescue medication - Albuterol that is beta-agonist and might be a-fib trigger. We will start Advair 250/50 as a base therapy to avoid/minimize use of albuterol.  3.OSA - recommended weight loss and follow up  4. Hypertension - controlled  5. Hyperlipidemia - followed by PCP, on atorvastatin 10 mh po daily   F/u in 2weeks    Tobias Alexander, Rexene Edison, MD, [facility] 09/15/2013, 10:17 AM

## 2013-09-15 NOTE — Patient Instructions (Addendum)
Your physician has requested that you have an echocardiogram. Echocardiography is a painless test that uses sound waves to create images of your heart. It provides your doctor with information about the size and shape of your heart and how well your heart's chambers and valves are working. This procedure takes approximately one hour. There are no restrictions for this procedure.    Schedule 48 hour holter monitor     Your physician recommends that you schedule a follow-up appointment in: 2 weeks    Start Advair Inhaler 2 puffs twice a day  Start Albuterol inhaler 2 puffs every 6 hours as needed

## 2013-09-15 NOTE — Patient Instructions (Signed)
Will start on breo one inhalation each am everyday whether you need or not.  Rinse mouth well. Do not pick up advair Ok to pick up albuterol inhaler, and can use 2 puffs every 6hrs if needed for rescue Do NOT use over the counter inhalers ever! Stop smoking. Will hold off on oxygen at night, and will recheck in a month or so after you have been on medications and decreaseed or stopped smoking. Will schedule for breathing studies in 4 weeks, and see you back same day to review.

## 2013-09-16 ENCOUNTER — Encounter (INDEPENDENT_AMBULATORY_CARE_PROVIDER_SITE_OTHER): Payer: BC Managed Care – PPO

## 2013-09-16 ENCOUNTER — Encounter: Payer: Self-pay | Admitting: *Deleted

## 2013-09-16 DIAGNOSIS — I39 Endocarditis and heart valve disorders in diseases classified elsewhere: Secondary | ICD-10-CM

## 2013-09-16 DIAGNOSIS — I639 Cerebral infarction, unspecified: Secondary | ICD-10-CM

## 2013-09-16 DIAGNOSIS — I4891 Unspecified atrial fibrillation: Secondary | ICD-10-CM

## 2013-09-16 NOTE — Progress Notes (Signed)
Patient ID: Nicholas Camacho, male   DOB: 01/18/1951, 62 y.o.   MRN: [id number] E-Card[employer] 48 hour holter monitor applied to patient. 

## 2013-09-17 ENCOUNTER — Telehealth: Payer: Self-pay | Admitting: Pulmonary Disease

## 2013-09-17 DIAGNOSIS — E785 Hyperlipidemia, unspecified: Secondary | ICD-10-CM | POA: Insufficient documentation

## 2013-09-17 DIAGNOSIS — I10 Essential (primary) hypertension: Secondary | ICD-10-CM | POA: Insufficient documentation

## 2013-09-17 DIAGNOSIS — G459 Transient cerebral ischemic attack, unspecified: Secondary | ICD-10-CM | POA: Insufficient documentation

## 2013-09-17 NOTE — Telephone Encounter (Signed)
Notes Recorded by Barbaraann Share, MD on 09/15/2013 at 5:28 PM Please let pt know that cxr looks fine.   I spoke with patient about results and he verbalized understanding and had no questions

## 2013-09-17 NOTE — Progress Notes (Signed)
Quick Note:  lmomtcb on pt's home and cell #s ______ 

## 2013-09-28 ENCOUNTER — Ambulatory Visit (HOSPITAL_COMMUNITY)
Admission: RE | Admit: 2013-09-28 | Discharge: 2013-09-28 | Disposition: A | Discharge status: Home / Self Care | Payer: BC Managed Care – PPO | Source: Ambulatory Visit | Attending: Cardiology | Admitting: Cardiology

## 2013-09-28 VITALS — BP 123/76 | HR 82 | Resp 18

## 2013-09-28 DIAGNOSIS — E785 Hyperlipidemia, unspecified: Secondary | ICD-10-CM

## 2013-09-28 DIAGNOSIS — J449 Chronic obstructive pulmonary disease, unspecified: Secondary | ICD-10-CM

## 2013-09-28 DIAGNOSIS — I359 Nonrheumatic aortic valve disorder, unspecified: Secondary | ICD-10-CM

## 2013-09-28 DIAGNOSIS — I639 Cerebral infarction, unspecified: Secondary | ICD-10-CM

## 2013-09-28 DIAGNOSIS — I4891 Unspecified atrial fibrillation: Secondary | ICD-10-CM

## 2013-09-28 DIAGNOSIS — Z8673 Personal history of transient ischemic attack (TIA), and cerebral infarction without residual deficits: Secondary | ICD-10-CM | POA: Insufficient documentation

## 2013-09-28 DIAGNOSIS — I10 Essential (primary) hypertension: Secondary | ICD-10-CM

## 2013-09-28 NOTE — Progress Notes (Signed)
2D Echocardiogram with bubble study has been performed.  Nicholas Camacho 09/28/2013, 12:02 PM

## 2013-09-29 ENCOUNTER — Encounter: Payer: Self-pay | Admitting: Cardiology

## 2013-09-29 ENCOUNTER — Ambulatory Visit (INDEPENDENT_AMBULATORY_CARE_PROVIDER_SITE_OTHER): Payer: BC Managed Care – PPO | Admitting: Cardiology

## 2013-09-29 VITALS — BP 116/78 | HR 64 | Ht 70.0 in | Wt 196.8 lb

## 2013-09-29 DIAGNOSIS — Z7901 Long term (current) use of anticoagulants: Secondary | ICD-10-CM

## 2013-09-29 DIAGNOSIS — I4891 Unspecified atrial fibrillation: Secondary | ICD-10-CM | POA: Insufficient documentation

## 2013-09-29 LAB — CBC
HCT: 42.6 % (ref 39.0–52.0)
Hemoglobin: 14.2 g/dL (ref 13.0–17.0)
MCHC: 33.2 g/dL (ref 30.0–36.0)
MCV: 89.4 fl (ref 78.0–100.0)
Platelets: 236 10*3/uL (ref 150.0–400.0)
RBC: 4.77 Mil/uL (ref 4.22–5.81)
RDW: 14 % (ref 11.5–14.6)
WBC: 7 10*3/uL (ref 4.5–10.5)

## 2013-09-29 LAB — BASIC METABOLIC PANEL
BUN: 17 mg/dL (ref 6–23)
CO2: 27 mEq/L (ref 19–32)
Calcium: 9.1 mg/dL (ref 8.4–10.5)
Chloride: 104 mEq/L (ref 96–112)
Creatinine, Ser: 1.1 mg/dL (ref 0.4–1.5)
GFR: 71.4 mL/min (ref >60.00)
Glucose, Bld: 99 mg/dL (ref 70–99)
Potassium: 4.2 mEq/L (ref 3.5–5.1)
Sodium: 137 mEq/L (ref 135–145)

## 2013-09-29 MED ORDER — DILTIAZEM HCL ER COATED BEADS 120 MG PO CP24: 120.0000 mg | ORAL_CAPSULE | Freq: Every day | ORAL | Status: DC

## 2013-09-29 NOTE — Progress Notes (Signed)
Patient ID: Nicholas Camacho, male   DOB: 06/27/1951, 62 y.o.   MRN: [id number]    Patient Name: Nicholas Camacho Date of Encounter: 09/29/2013  Primary Care Provider:  Farris Has, MD Primary Cardiologist:  Tobias Alexander, H  Problem List   Past Medical History  Diagnosis Date  . Elevated cholesterol   . Disturbance of skin sensation 06/24/2013  . Sleep disturbance, unspecified 06/24/2013   Past Surgical History  Procedure Laterality Date  . Fractured pelvis    . Reconstruct left heel  2002  . Tonsillectomy    . Inguinal hernia repair Bilateral     Allergies  Allergies  Allergen Reactions  . Morphine And Related     HALLUCINATIONS    HPI  A 62 year old male with h/o hypertension, hyperlipidemia, COPD and recent intermittent left hand numbness, tingling and weakness with ? TIA. CT and MRI of the brain were normal.EMG and nerve conduction studies showed evidence of mild bilateral carpal tunnel syndrome. He was also evaluated for OSA and was told that he had a short run of atrial fibrillation. The patient is not aware of palpitations and denies any prior syncope, CP or SOB. He is ongoing smoker.  Home Medications  Prior to Admission medications   Medication Sig Start Date End Date Taking? Authorizing Provider  aspirin 325 MG EC tablet Take 325 mg by mouth daily.   Yes Historical Provider, MD  lisinopril (PRINIVIL,ZESTRIL) 10 MG tablet Take 1 tablet by mouth daily. 07/28/13  Yes Historical Provider, MD  Racepinephrine HCl (ASTHMANEFRIN REFILL) 2.25 % NEBU nebulizer solution Take 0.5 mLs by nebulization every 4 (four) hours as needed.   Yes Historical Provider, MD  atorvastatin (LIPITOR) 10 MG tablet Take 10 mg by mouth daily. 06/12/13   Historical Provider, MD    Family History  Family History  Problem Relation Age of Onset  . Diabetes Mother   . Prostate cancer Father   . Kidney failure Father   . Alcoholism Sister     history of  . Drug abuse Sister    history of    Social History  History   Social History  . Marital Status: Married    Spouse Name: N/A    Number of Children: 2  . Years of Education: COLLEGE   Occupational History  . pest control    Social History Main Topics  . Smoking status: Current Every Day Smoker -- 1.50 packs/day for 45 years    Types: Cigarettes  . Smokeless tobacco: Current User  . Alcohol Use: No  . Drug Use: No  . Sexual Activity: Not on file   Other Topics Concern  . Not on file   Social History Narrative  . No narrative on file     Review of Systems, as per HPI, otherwise negative General:  No chills, fever, night sweats or weight changes.  Cardiovascular:  No chest pain, dyspnea on exertion, edema, orthopnea, palpitations, paroxysmal nocturnal dyspnea. Dermatological: No rash, lesions/masses Respiratory: No cough, dyspnea Urologic: No hematuria, dysuria Abdominal:   No nausea, vomiting, diarrhea, bright red blood per rectum, melena, or hematemesis Neurologic:  No visual changes, wkns, changes in mental status. All other systems reviewed and are otherwise negative except as noted above.  Physical Exam  Blood pressure 116/78, pulse 64, height 5\' 10"  (1.778 m), weight 196 lb 12.8 oz (89.268 kg).  General: Pleasant, NAD Psych: Normal affect. Neuro: Alert and oriented X 3. Moves all extremities spontaneously. HEENT: Normal  Neck:  Supple without bruits or JVD. Lungs:  Resp regular and unlabored, CTA. Heart: RRR no s3, s4, or murmurs. Abdomen: Soft, non-tender, non-distended, BS + x 4.  Extremities: No clubbing, cyanosis or edema. DP/PT/Radials 2+ and equal bilaterally.  Labs:  No results found for this basename: CKTOTAL, CKMB, TROPONINI,  in the last 72 hours No results found for this basename: WBC,  HGB,  HCT,  MCV,  PLT   No results found for this basename: NA, K, CL, CO2, BUN, CREATININE, CALCIUM, LABALBU, PROT, BILITOT, ALKPHOS, ALT, AST, GLUCOSE,  in the last 168 hours No  results found for this basename: CHOL,  HDL,  LDLCALC,  TRIG   No results found for this basename: DDIMER   No components found with this basename: POCBNP,   Accessory Clinical Findings  Echocardiogram - 09/28/2013 Study Conclusions  - Left ventricle: The cavity size was normal. Systolic function was normal. The estimated ejection fraction was in the range of 60% to 65%. Wall motion was normal; there were no regional wall motion abnormalities. - Aortic valve: Mild regurgitation. - Atrial septum: There was increased thickness of the septum, consistent with lipomatous hypertrophy.  ECG - normal sinus rhythm, rightward axis, incomplete right bundle branch block, borderline EKG    Assessment & Plan  A 62 year male   1. Upper extremity tingling, weakness - most probably carpal tunnel syndrome, normal MRI, a-fib found incidentally during sleep study. A 48 Hour Holter monitor showed 12 short episodes of atrial fibrillation, the longest was 21 beats, echo showed normal LVEF and normal LA size.  CHADS-VASc 2, we will start Xarelto. Start cardizem 120 mg po QD ( we will have to discontinue lisinopril),   Follow up in 2 months with repeated 48 hour holter.  2. COPD improved wheezing - the patient was started on long term inhaled steroid by a pulmonologist.    3. OSA - recommended weight loss and follow up  4. Hypertension - controlled, switch lisinopril 10 mg to cardizem CD 120 mg PO daily.  5. Hyperlipidemia - followed by PCP, on atorvastatin 10 mg po daily   F/u in 2 months with repeated 48 H holter    Lars Masson, MD, [facility] 09/29/2013, 8:45 AM

## 2013-09-29 NOTE — Addendum Note (Signed)
Addended by: Migdalia Dk on: 09/29/2013 09:20 AM   Modules accepted: Orders

## 2013-09-29 NOTE — Patient Instructions (Signed)
Start Xarelto   Stop Lisinopril    Start Cardizem CD 120 mg daily    Your physician recommends that you schedule a follow-up appointment in: 2 months with a 48 hour Holter Monitor

## 2013-09-29 NOTE — Addendum Note (Signed)
Addended by: Meda Klinefelter D on: 09/29/2013 10:28 AM   Modules accepted: Orders, Medications

## 2013-09-30 ENCOUNTER — Telehealth: Payer: Self-pay

## 2013-09-30 MED ORDER — RIVAROXABAN 20 MG PO TABS: 20.0000 mg | ORAL_TABLET | Freq: Every day | ORAL | Status: DC

## 2013-09-30 NOTE — Telephone Encounter (Signed)
Spoke with pt's wife made aware labwork WNL.  Pt is to start taking Xarelto 20mg  daily with evening meal.  Advised to have pt monitor for any signs of abnormal bleeding and contact office if occurs. Pt's wife verbalized understanding.  Rx sent electronically to pharmacy as requested.   Will forward telephone note to Dr Delton See.  Pt is on 325mg  ASA daily, since pt is starting Xarelto do you want him to continue on same dosage or do you want to decrease ASA to 81mg  or stop all together?  Please advise, Thanks.

## 2013-09-30 NOTE — Telephone Encounter (Signed)
Pt saw Dr Delton See yesterday, pt was to start on Xarelto dosed by Coumadin clinic.  Pt did not have baseline labwork BMP or CBC.  Labs drawn on 09/29/13 and reviewed.  Bun 17/Creat 1.1, Wt. 89kg, age 62 based on these values pt's Creatine Clearance is 88.78.  Pt should start taking Xarelto 20mg  once daily with evening meal.

## 2013-10-02 ENCOUNTER — Telehealth: Payer: Self-pay | Admitting: Cardiology

## 2013-10-02 NOTE — Telephone Encounter (Signed)
Called having questions regarding 2 of his new medications.  He received Xarelto and wanted to know if Lisinopril was replaced by Cardizem.  Advised that according to [name] note he was to stop Lisinopril and start Cardizem.  He does have Cardizem and advised to start taking that medication.  He verbalizes understanding and will start today with new medications.  Will call if has any further questions.

## 2013-10-02 NOTE — Telephone Encounter (Signed)
New Message  Pt called states that he has two new prescriptions and he believes that one is replacing the other//No further details would rather discuss further with the nurse. Requests a call back for clarification.

## 2013-10-05 NOTE — Telephone Encounter (Signed)
Hi Larita FifeLynn,  Could you call him to take aspirin 81 mg po daily instead of 325 mg po daily? Thank you, Aris LotKatarina

## 2013-10-07 ENCOUNTER — Telehealth: Payer: Self-pay | Admitting: Cardiology

## 2013-10-07 NOTE — Telephone Encounter (Signed)
New message ° ° ° ° ° ° ° ° ° °Pt returning nurses call °

## 2013-10-07 NOTE — Telephone Encounter (Signed)
Spoke with pt aware to stop 325mg  ASA and start 81mg  ASA daily.  Pt states [facility] needs clarification on Proair rx sent in by Dr Delton SeeNelson.  Reference # [id number] please call [phone] option 2 and advise.  Thanks.

## 2013-10-07 NOTE — Telephone Encounter (Addendum)
I called [facility] but was unable to speak to a pharmacist nor could I understand what is needed for pt to receive his meds . I was advised to call back and speak to a pharmacist at [phone]. I called back and spoke with Juevence, an [facility] representative, who states that he is going to fax over the form needed to fill the pts medications. Pt aware that I have contacted [facility] concerning his medications.

## 2013-10-12 ENCOUNTER — Other Ambulatory Visit: Payer: Self-pay | Admitting: Family Medicine

## 2013-10-12 DIAGNOSIS — Z1211 Encounter for screening for malignant neoplasm of colon: Secondary | ICD-10-CM

## 2013-10-13 ENCOUNTER — Other Ambulatory Visit: Payer: Self-pay

## 2013-10-13 MED ORDER — RIVAROXABAN 20 MG PO TABS: 20.0000 mg | ORAL_TABLET | Freq: Every day | ORAL | Status: DC

## 2013-10-13 MED ORDER — ALBUTEROL SULFATE HFA 108 (90 BASE) MCG/ACT IN AERS: 2.0000 | INHALATION_SPRAY | Freq: Four times a day (QID) | RESPIRATORY_TRACT | Status: DC | PRN

## 2013-10-22 ENCOUNTER — Other Ambulatory Visit: Payer: Self-pay

## 2013-10-22 MED ORDER — DILTIAZEM HCL ER COATED BEADS 120 MG PO CP24: 120.0000 mg | ORAL_CAPSULE | Freq: Every day | ORAL | Status: DC

## 2013-10-23 ENCOUNTER — Ambulatory Visit (INDEPENDENT_AMBULATORY_CARE_PROVIDER_SITE_OTHER): Payer: BC Managed Care – PPO | Admitting: Pulmonary Disease

## 2013-10-23 ENCOUNTER — Encounter: Payer: Self-pay | Admitting: Pulmonary Disease

## 2013-10-23 ENCOUNTER — Other Ambulatory Visit: Payer: Self-pay | Admitting: Pulmonary Disease

## 2013-10-23 ENCOUNTER — Encounter (INDEPENDENT_AMBULATORY_CARE_PROVIDER_SITE_OTHER): Payer: Self-pay

## 2013-10-23 VITALS — BP 132/72 | HR 69 | Temp 97.5°F | Ht 68.0 in | Wt 198.0 lb

## 2013-10-23 DIAGNOSIS — J449 Chronic obstructive pulmonary disease, unspecified: Secondary | ICD-10-CM

## 2013-10-23 LAB — PULMONARY FUNCTION TEST
DL/VA % pred: 72 %
DL/VA: 3.26 ml/min/mmHg/L
DLCO unc % pred: 72 %
DLCO unc: 21.4 ml/min/mmHg
FEF 25-75 Post: 1.28 L/sec
FEF 25-75 Pre: 1.21 L/sec
FEF2575-%Change-Post: 5 %
FEF2575-%Pred-Post: 47 %
FEF2575-%Pred-Pre: 44 %
FEV1-%Change-Post: 1 %
FEV1-%Pred-Post: 73 %
FEV1-%Pred-Pre: 72 %
FEV1-Post: 2.41 L
FEV1-Pre: 2.38 L
FEV1FVC-%Change-Post: 1 %
FEV1FVC-%Pred-Pre: 77 %
FEV6-%Change-Post: 0 %
FEV6-%Pred-Post: 96 %
FEV6-%Pred-Pre: 96 %
FEV6-Post: 4.02 L
FEV6-Pre: 4.01 L
FEV6FVC-%Change-Post: 0 %
FEV6FVC-%Pred-Post: 102 %
FEV6FVC-%Pred-Pre: 102 %
FVC-%Change-Post: 0 %
FVC-%Pred-Post: 94 %
FVC-%Pred-Pre: 93 %
FVC-Post: 4.12 L
FVC-Pre: 4.11 L
Post FEV1/FVC ratio: 58 %
Post FEV6/FVC ratio: 97 %
Pre FEV1/FVC ratio: 58 %
Pre FEV6/FVC Ratio: 98 %
RV % pred: 206 %
RV: 4.49 L
TLC % pred: 130 %
TLC: 8.64 L

## 2013-10-23 MED ORDER — FLUTICASONE FUROATE-VILANTEROL 100-25 MCG/INH IN AEPB: 1.0000 | INHALATION_SPRAY | Freq: Every day | RESPIRATORY_TRACT | Status: DC

## 2013-10-23 NOTE — Assessment & Plan Note (Addendum)
The patient's PFTs today showed moderate airflow obstruction, but luckily only a mild decrease in his diffusion capacity. Hopefully this means that he can have further reversibility with ongoing maintenance therapy and total smoking cessation. The patient feels that he is much improved since being on a maintenance bronchodilator regimen. He has also cut way back on his smoking from 2 packs a day to 4 cigarettes a day, and I have told him this is probably impacted his breathing even more so than his medication. I have encouraged him to continue moving toward total smoking cessation, and have given him a flyer for an upcoming smoking cessation class at [facility] center. I also discussed the role of various bronchodilators for COPD, and I would like to continue him on a Breo for now. We can add an anticholinergic therapy if he continues to be symptomatic.  We also need to check his overnight oximetry in about 4 weeks to make sure his oxygen saturations have improved at night.

## 2013-10-23 NOTE — Progress Notes (Signed)
   Subjective:    Patient ID: Nicholas Camacho, male    DOB: 07/19/1951, 63 y.o.   MRN: [id number]  HPI The patient comes in today for followup of his COPD. He was started on Breo at the last visit, and has seen definite improvement in his airway symptoms. He is also seen some improvement, and in stairs and with other exertional activities. He had pulmonary function studies today which showed moderate airflow obstruction, and only a mild decrease in his diffusion capacity. I have reviewed his PFTs with he and his wife, and answered all of their questions.   Review of Systems  Constitutional: Negative for fever and unexpected weight change.  HENT: Negative for congestion, dental problem, ear pain, nosebleeds, postnasal drip, rhinorrhea, sinus pressure, sneezing, sore throat and trouble swallowing.   Eyes: Negative for redness and itching.  Respiratory: Negative for cough, chest tightness, shortness of breath and wheezing.   Cardiovascular: Negative for palpitations and leg swelling.  Gastrointestinal: Negative for nausea and vomiting.  Genitourinary: Negative for dysuria.  Musculoskeletal: Negative for joint swelling.  Skin: Negative for rash.  Neurological: Negative for headaches.  Hematological: Does not bruise/bleed easily.  Psychiatric/Behavioral: Negative for dysphoric mood. The patient is not nervous/anxious.        Objective:   Physical Exam Thin male in no acute distress Nose without purulence or discharge noted Neck without lymphadenopathy or thyromegaly Chest with mildly decreased breath sounds, but no wheezing Cardiac exam with regular rate and rhythm Lower extremities without edema, no cyanosis Alert and oriented, moves all 4 extremities.       Assessment & Plan:

## 2013-10-23 NOTE — Patient Instructions (Signed)
Continue on breo as directed. Keep working toward total smoking cessation.  You are doing well. Stay as active as possible Will arrange for an oxygen check overnight in 4 weeks.  Call us if this does not happen. followup with me again in 4 mos, but call if having breathing issues or having to use rescue inhaler more frequency.

## 2013-10-23 NOTE — Progress Notes (Signed)
PFT done. Jennifer Castillo, CMA  

## 2013-11-09 ENCOUNTER — Other Ambulatory Visit: Payer: Self-pay | Admitting: General Surgery

## 2013-11-09 ENCOUNTER — Encounter (INDEPENDENT_AMBULATORY_CARE_PROVIDER_SITE_OTHER): Payer: BC Managed Care – PPO

## 2013-11-09 ENCOUNTER — Encounter: Payer: Self-pay | Admitting: *Deleted

## 2013-11-09 DIAGNOSIS — I4891 Unspecified atrial fibrillation: Secondary | ICD-10-CM

## 2013-11-09 NOTE — Progress Notes (Signed)
Patient ID: Nicholas NakayamaCharles M Camacho, male   DOB: 09/17/1951, 63 y.o.   MRN: [id number] E-Cardio 48 hour holter monitor applied to patient.

## 2013-11-10 ENCOUNTER — Other Ambulatory Visit: Payer: BC Managed Care – PPO

## 2013-11-11 ENCOUNTER — Ambulatory Visit
Admission: RE | Admit: 2013-11-11 | Discharge: 2013-11-11 | Disposition: A | Discharge status: Home / Self Care | Payer: BC Managed Care – PPO | Source: Ambulatory Visit | Attending: Family Medicine | Admitting: Family Medicine

## 2013-11-11 DIAGNOSIS — Z1211 Encounter for screening for malignant neoplasm of colon: Secondary | ICD-10-CM

## 2013-11-17 ENCOUNTER — Ambulatory Visit: Payer: BC Managed Care – PPO | Admitting: Cardiology

## 2013-11-27 ENCOUNTER — Telehealth: Payer: Self-pay | Admitting: *Deleted

## 2013-11-27 DIAGNOSIS — J449 Chronic obstructive pulmonary disease, unspecified: Secondary | ICD-10-CM

## 2013-11-27 NOTE — Telephone Encounter (Signed)
Let pt know that his ONO showed that he drops his oxygen level as low as 80%, and spent 1hr and less than 88%.  He needs to wear oxygen at night while sleeping.  See if he is ok with this, and can order.

## 2013-11-27 NOTE — Telephone Encounter (Signed)
LMOM x 2 

## 2013-11-27 NOTE — Telephone Encounter (Signed)
LMTCB

## 2013-11-27 NOTE — Telephone Encounter (Signed)
ONO results have been received and placed in KC green folder for review. Please advise, thanks.   

## 2013-11-30 NOTE — Telephone Encounter (Signed)
Pt is aware of his ONO results. He wants to talk with this wife about this and will call us back and let us know what he decides.

## 2013-11-30 NOTE — Telephone Encounter (Signed)
lmtcb x1 

## 2013-11-30 NOTE — Telephone Encounter (Signed)
Pt returning call, please call at [phone]

## 2013-11-30 NOTE — Telephone Encounter (Signed)
Pt is returning call & can be reached at [phone].  Nicholas Camacho

## 2013-11-30 NOTE — Telephone Encounter (Signed)
LMOM X2

## 2013-12-01 NOTE — Telephone Encounter (Signed)
Spoke with pt. He is not wanting to use oxygen at night right now. States that he knows that he did not sleep well the night the ONO was done. Wants to know if he can repeat the ONO in one month.  KC - please advise. Thanks.

## 2013-12-01 NOTE — Telephone Encounter (Signed)
Called, spoke with pt.  [facility]  He verbalized understanding and is aware DME will be contacting him to set o2 up.    Pt's best contact # during the day is his cell #.

## 2013-12-01 NOTE — Telephone Encounter (Signed)
2 liters at hs only

## 2013-12-01 NOTE — Telephone Encounter (Signed)
The better he sleeps, the deeper he sleeps, and the MORE his oxygen goes down.  If he drops when he doesn't sleep well, he will really drop when he sleeps better.   No reason to do again.  Either he is willing to wear oxygen at night or not.

## 2013-12-01 NOTE — Telephone Encounter (Signed)
Pt returned call. Please call [phone]. Please do not call house #

## 2013-12-01 NOTE — Telephone Encounter (Signed)
Pt is willing to use oxygen at night. What liter flow should he be on?  KC - please advise. Thanks.

## 2013-12-03 ENCOUNTER — Ambulatory Visit (INDEPENDENT_AMBULATORY_CARE_PROVIDER_SITE_OTHER): Payer: BC Managed Care – PPO | Admitting: Cardiology

## 2013-12-03 ENCOUNTER — Encounter: Payer: Self-pay | Admitting: Cardiology

## 2013-12-03 VITALS — BP 132/90 | HR 70 | Ht 68.0 in | Wt 202.0 lb

## 2013-12-03 DIAGNOSIS — F17200 Nicotine dependence, unspecified, uncomplicated: Secondary | ICD-10-CM

## 2013-12-03 DIAGNOSIS — Z87891 Personal history of nicotine dependence: Secondary | ICD-10-CM | POA: Insufficient documentation

## 2013-12-03 DIAGNOSIS — IMO0001 Reserved for inherently not codable concepts without codable children: Secondary | ICD-10-CM

## 2013-12-03 DIAGNOSIS — I714 Abdominal aortic aneurysm, without rupture, unspecified: Secondary | ICD-10-CM

## 2013-12-03 NOTE — Patient Instructions (Signed)
Your physician recommends that you continue on your current medications as directed. Please refer to the Current Medication list given to you today.  Your physician wants you to follow-up in: 1 year. You will receive a reminder letter in the mail two months in advance. If you don't receive a letter, please call our office to schedule the follow-up appointment.  

## 2013-12-03 NOTE — Progress Notes (Signed)
Patient ID: Nicholas NakayamaCharles M Camacho, male   DOB: 10/24/1950, 63 y.o.   MRN: [id number]    Patient Name: Nicholas NakayamaCharles M Messler Date of Encounter: 12/03/2013  Primary Care Provider:  [name], [name], MD Primary Cardiologist:  Lars MassonNELSON, Shequila Neglia H  Problem List   Past Medical History  Diagnosis Date  . Elevated cholesterol   . Disturbance of skin sensation 06/24/2013  . Sleep disturbance, unspecified 06/24/2013   Past Surgical History  Procedure Laterality Date  . Fractured pelvis    . Reconstruct left heel  2002  . Tonsillectomy    . Inguinal hernia repair Bilateral    Allergies  Allergies  Allergen Reactions  . Morphine And Related     HALLUCINATIONS   HPI  A 63 year old male with h/o hypertension, hyperlipidemia, COPD and recent intermittent left hand numbness, tingling and weakness with ? TIA. CT and MRI of the brain were normal.EMG and nerve conduction studies showed evidence of mild bilateral carpal tunnel syndrome. He was also evaluated for OSA and was told that he had a short run of atrial fibrillation. The patient is not aware of palpitations and denies any prior syncope, CP or SOB. He is ongoing smoker.  Home Medications  Prior to Admission medications   Medication Sig Start Date End Date Taking? Authorizing Provider  aspirin 325 MG EC tablet Take 325 mg by mouth daily.   Yes Historical Provider, MD  lisinopril (PRINIVIL,ZESTRIL) 10 MG tablet Take 1 tablet by mouth daily. 07/28/13  Yes Historical Provider, MD  Racepinephrine HCl (ASTHMANEFRIN REFILL) 2.25 % NEBU nebulizer solution Take 0.5 mLs by nebulization every 4 (four) hours as needed.   Yes Historical Provider, MD  atorvastatin (LIPITOR) 10 MG tablet Take 10 mg by mouth daily. 06/12/13   Historical Provider, MD    Family History  Family History  Problem Relation Age of Onset  . Diabetes Mother   . Prostate cancer Father   . Kidney failure Father   . Alcoholism Sister     history of  . Drug abuse Sister     history  of    Social History  History   Social History  . Marital Status: Married    Spouse Name: N/A    Number of Children: 2  . Years of Education: COLLEGE   Occupational History  . pest control    Social History Main Topics  . Smoking status: Current Every Day Smoker -- 1.50 packs/day for 45 years    Types: Cigarettes  . Smokeless tobacco: Current User     Comment: PT CURRENTLY SMOKING ON 3-4 CIGS DAILY!!!  . Alcohol Use: No  . Drug Use: No  . Sexual Activity: Not on file   Other Topics Concern  . Not on file   Social History Narrative  . No narrative on file     Review of Systems, as per HPI, otherwise negative General:  No chills, fever, night sweats or weight changes.  Cardiovascular:  No chest pain, dyspnea on exertion, edema, orthopnea, palpitations, paroxysmal nocturnal dyspnea. Dermatological: No rash, lesions/masses Respiratory: No cough, dyspnea Urologic: No hematuria, dysuria Abdominal:   No nausea, vomiting, diarrhea, bright red blood per rectum, melena, or hematemesis Neurologic:  No visual changes, wkns, changes in mental status. All other systems reviewed and are otherwise negative except as noted above.  Physical Exam  Blood pressure 132/90, pulse 70, height 5\' 8"  (1.727 m), weight 202 lb (91.627 kg).  General: Pleasant, NAD Psych: Normal affect. Neuro: Alert and oriented  X 3. Moves all extremities spontaneously. HEENT: Normal  Neck: Supple without bruits or JVD. Lungs:  Resp regular and unlabored, CTA. Heart: RRR no s3, s4, or murmurs. Abdomen: Soft, non-tender, non-distended, BS + x 4.  Extremities: No clubbing, cyanosis or edema. DP/PT/Radials 2+ and equal bilaterally.  Labs:  No results found for this basename: CKTOTAL, CKMB, TROPONINI,  in the last 72 hours Lab Results  Component Value Date   WBC 7.0 09/29/2013   No results found for this basename: NA, K, CL, CO2, BUN, CREATININE, CALCIUM, LABALBU, PROT, BILITOT, ALKPHOS, ALT, AST, GLUCOSE,   in the last 168 hours No results found for this basename: CHOL,  HDL,  LDLCALC,  TRIG   No results found for this basename: DDIMER   No components found with this basename: POCBNP,   Accessory Clinical Findings  Echocardiogram - 09/28/2013 Study Conclusions  - Left ventricle: The cavity size was normal. Systolic function was normal. The estimated ejection fraction was in the range of 60% to 65%. Wall motion was normal; there were no regional wall motion abnormalities. - Aortic valve: Mild regurgitation. - Atrial septum: There was increased thickness of the septum, consistent with lipomatous hypertrophy.  ECG - normal sinus rhythm, rightward axis, incomplete right bundle branch block, borderline EKG    Assessment & Plan  A 63 year male   1. Upper extremity tingling, weakness - normal MRI, a-fib found incidentally during sleep study. A 48 Hour Holter monitor showed 12 short episodes of atrial fibrillation, the longest was 21 beats, echo showed normal LVEF and normal LA size. The patient was started on Xarelto (CHADS-VASc 2) and Cardizem, repeated 48 hour Holter on 2/9-07/2014 showed 11 episodes of a-fib, the longest lasting 22 beats.  We will continue the same regimen.  2. COPD improved wheezing - the patient was started on long term inhaled steroid by a pulmonologist.    3. OSA - the patient is getting a new CPAP machine, he was found to desaturate to 80% at night. This is most certainly contributing to his a-fib episodes.   4. Hypertension - controlled, continue cardizem CD 120 mg PO daily.  5. Hyperlipidemia - followed by PCP, on atorvastatin 10 mg po daily  6. 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 The patient is cutting down on smoking, currently 3-4 cigarettes from 1 PPD.  F/u in 1 year with AAA ultrasound.   Lars Masson, MD, [facility] 12/03/2013, 11:47 AM

## 2013-12-24 ENCOUNTER — Encounter: Payer: Self-pay | Admitting: Pulmonary Disease

## 2014-01-13 NOTE — Telephone Encounter (Signed)
Please close this encounter

## 2014-02-23 ENCOUNTER — Ambulatory Visit (INDEPENDENT_AMBULATORY_CARE_PROVIDER_SITE_OTHER): Payer: BC Managed Care – PPO | Admitting: Pulmonary Disease

## 2014-02-23 ENCOUNTER — Encounter: Payer: Self-pay | Admitting: Pulmonary Disease

## 2014-02-23 VITALS — BP 120/70 | HR 59 | Temp 97.8°F | Ht 70.0 in | Wt 201.4 lb

## 2014-02-23 DIAGNOSIS — J449 Chronic obstructive pulmonary disease, unspecified: Secondary | ICD-10-CM

## 2014-02-23 NOTE — Assessment & Plan Note (Signed)
The patient feels that he is doing fairly well on breo, as well as his nocturnal oxygen. He has noted increased shortness of breath with the high heat and humidity of the coming summer, and I have discussed with him adding anticholinergic therapy to his regimen. Will give him a trial of Spiriva, and he is to let us know if this helps. I also stressed to him the importance of total smoking cessation.

## 2014-02-23 NOTE — Patient Instructions (Signed)
Stay on breo as you are doing.  Keep mouth rinsed. Add spiriva respimat  2 inhalations each am.  Let me know after 4 weeks if this helped, and we can call in a prescription.   Work on total smoking cessation Stay on oxygen at night followup with me again in 64mos to check on progress

## 2014-02-23 NOTE — Progress Notes (Signed)
   Subjective:    Patient ID: Nicholas Camacho, male    DOB: 26-Mar-1951, 63 y.o.   MRN: [id number]  HPI The patient comes in today for followup of his known COPD. He is staying on his bronchodilator regimen and feels it has definitely helped him. He has cut way back on his smoking, with significant improvement. He is wearing his oxygen at night while sleeping.   Review of Systems  Constitutional: Negative for fever and unexpected weight change.  HENT: Negative for congestion, dental problem, ear pain, nosebleeds, postnasal drip, rhinorrhea, sinus pressure, sneezing, sore throat and trouble swallowing.   Eyes: Negative for redness and itching.  Respiratory: Positive for shortness of breath. Negative for cough, chest tightness and wheezing.   Cardiovascular: Negative for palpitations and leg swelling.  Gastrointestinal: Negative for nausea and vomiting.  Genitourinary: Negative for dysuria.  Musculoskeletal: Negative for joint swelling.  Skin: Negative for rash.  Neurological: Negative for headaches.  Hematological: Does not bruise/bleed easily.  Psychiatric/Behavioral: Negative for dysphoric mood. The patient is not nervous/anxious.        Objective:   Physical Exam Well-developed male in no acute distress Nose without purulence or discharge noted Neck without lymphadenopathy or thyromegaly Chest with decreased breath sounds, rhonchi noted, no active wheezing Cardiac exam with regular rate and rhythm Lower extremities without edema, no cyanosis Alert and oriented, moves all 4 extremities.       Assessment & Plan:

## 2014-03-22 ENCOUNTER — Telehealth: Payer: Self-pay | Admitting: Pulmonary Disease

## 2014-03-22 MED ORDER — TIOTROPIUM BROMIDE MONOHYDRATE 2.5 MCG/ACT IN AERS: 2.0000 | INHALATION_SPRAY | Freq: Every day | RESPIRATORY_TRACT | Status: DC

## 2014-03-22 NOTE — Telephone Encounter (Signed)
Called and spoke with pt and he stated that Tuesday will be 4 weeks that he has been on the spiriva respimat and he is doing very good on this.  Pt requested that a rx for this be sent to his pharmacy.  This has been sent in and will [facility] to make him aware.  FYI only.

## 2014-03-22 NOTE — Telephone Encounter (Signed)
Ok with me 

## 2014-06-17 ENCOUNTER — Other Ambulatory Visit: Payer: Self-pay | Admitting: Cardiology

## 2014-06-30 ENCOUNTER — Encounter: Payer: Self-pay | Admitting: Pulmonary Disease

## 2014-06-30 ENCOUNTER — Ambulatory Visit (INDEPENDENT_AMBULATORY_CARE_PROVIDER_SITE_OTHER): Payer: BC Managed Care – PPO | Admitting: Pulmonary Disease

## 2014-06-30 VITALS — BP 142/78 | HR 73 | Temp 98.4°F | Ht 69.0 in | Wt 206.4 lb

## 2014-06-30 DIAGNOSIS — J438 Other emphysema: Secondary | ICD-10-CM

## 2014-06-30 DIAGNOSIS — J439 Emphysema, unspecified: Secondary | ICD-10-CM

## 2014-06-30 MED ORDER — TIOTROPIUM BROMIDE MONOHYDRATE 2.5 MCG/ACT IN AERS: 2.0000 | INHALATION_SPRAY | Freq: Every day | RESPIRATORY_TRACT | Status: DC

## 2014-06-30 NOTE — Patient Instructions (Signed)
No change in your breathing medications Keep working on smoking cessation.  You have made good progress, now just need to totally quit. Work on some type of exercise program followup with me again in 6mos.

## 2014-06-30 NOTE — Progress Notes (Signed)
   Subjective:    Patient ID: Nicholas Camacho, male    DOB: 10/29/1950, 63 y.o.   MRN: [id number]  HPI The patient comes in today for followup of his known COPD. He has been staying on his bronchodilator regimen with definite improvement, but unfortunately continues to smoke a small amount each day. We have talked about smoking cessation, and trying Chantix. He has not had significant chest congestion or cough since last visit.   Review of Systems  Constitutional: Negative for fever and unexpected weight change.  HENT: Negative for congestion, dental problem, ear pain, nosebleeds, postnasal drip, rhinorrhea, sinus pressure, sneezing, sore throat and trouble swallowing.   Eyes: Negative for redness and itching.  Respiratory: Positive for cough and shortness of breath. Negative for chest tightness and wheezing.   Cardiovascular: Negative for palpitations and leg swelling.  Gastrointestinal: Negative for nausea and vomiting.  Genitourinary: Negative for dysuria.  Musculoskeletal: Negative for joint swelling.  Skin: Negative for rash.  Neurological: Negative for headaches.  Hematological: Does not bruise/bleed easily.  Psychiatric/Behavioral: Negative for dysphoric mood. The patient is not nervous/anxious.        Objective:   Physical Exam Overweight male in no acute distress Nose without purulence or discharge noted Neck without lymphadenopathy or thyromegaly Chest with mildly decreased breath sounds, no active wheezing Cardiac exam with regular rate and rhythm Lower extremities without edema, no cyanosis Alert and oriented, moves all 4 extremities.       Assessment & Plan:

## 2014-06-30 NOTE — Assessment & Plan Note (Signed)
The patient has done fairly well on his current bronchodilator regimen, but I suspect will improve further once he totally stops smoking. I've also encouraged him to work aggressively on weight loss and some type of conditioning program

## 2014-07-19 ENCOUNTER — Ambulatory Visit
Admission: RE | Admit: 2014-07-19 | Discharge: 2014-07-19 | Disposition: A | Discharge status: Home / Self Care | Payer: BC Managed Care – PPO | Source: Ambulatory Visit | Attending: Family Medicine | Admitting: Family Medicine

## 2014-07-19 ENCOUNTER — Other Ambulatory Visit: Payer: Self-pay | Admitting: Family Medicine

## 2014-07-19 DIAGNOSIS — Z8709 Personal history of other diseases of the respiratory system: Secondary | ICD-10-CM

## 2014-07-19 DIAGNOSIS — R0602 Shortness of breath: Secondary | ICD-10-CM

## 2014-07-22 ENCOUNTER — Telehealth: Payer: Self-pay | Admitting: Pulmonary Disease

## 2014-07-22 MED ORDER — MOMETASONE FURO-FORMOTEROL FUM 100-5 MCG/ACT IN AERO: 2.0000 | INHALATION_SPRAY | Freq: Two times a day (BID) | RESPIRATORY_TRACT | Status: DC

## 2014-07-22 NOTE — Telephone Encounter (Signed)
[facility] pt [facility]  Rx sent to [facility] per pt request.

## 2014-07-22 NOTE — Telephone Encounter (Signed)
Ok to change to dulera 100, 2 inhalations am and pm everyday.  Keep mouth rinsed.

## 2014-07-22 NOTE — Telephone Encounter (Signed)
PT states that he received a letter from [facility] stating that Virgel BouquetBreo is not covered.  The alternatives are Dulera and Symbicort.  PT states that he just got Breo refilled through [facility] with a coupon he had and it was no cost but he knows this will run out and will need to send rx to [facility].  Please advise on alternative.

## 2014-08-11 ENCOUNTER — Other Ambulatory Visit: Payer: Self-pay | Admitting: Family Medicine

## 2014-08-11 ENCOUNTER — Ambulatory Visit
Admission: RE | Admit: 2014-08-11 | Discharge: 2014-08-11 | Disposition: A | Discharge status: Home / Self Care | Payer: BC Managed Care – PPO | Source: Ambulatory Visit | Attending: Family Medicine | Admitting: Family Medicine

## 2014-08-11 DIAGNOSIS — J441 Chronic obstructive pulmonary disease with (acute) exacerbation: Secondary | ICD-10-CM

## 2014-08-15 ENCOUNTER — Other Ambulatory Visit: Payer: Self-pay | Admitting: Cardiology

## 2014-09-01 ENCOUNTER — Other Ambulatory Visit: Payer: Self-pay | Admitting: Cardiology

## 2014-09-16 ENCOUNTER — Other Ambulatory Visit: Payer: Self-pay | Admitting: Family Medicine

## 2014-09-16 ENCOUNTER — Ambulatory Visit
Admission: RE | Admit: 2014-09-16 | Discharge: 2014-09-16 | Disposition: A | Discharge status: Home / Self Care | Payer: BC Managed Care – PPO | Source: Ambulatory Visit | Attending: Family Medicine | Admitting: Family Medicine

## 2014-09-16 DIAGNOSIS — R918 Other nonspecific abnormal finding of lung field: Secondary | ICD-10-CM

## 2014-11-03 ENCOUNTER — Telehealth: Payer: Self-pay | Admitting: Cardiology

## 2014-11-03 NOTE — Telephone Encounter (Signed)
Received request from Nurse fax box, documents faxed for surgical clearance. To: [facility] Fax number: [phone] Attention: 2.3.16/km

## 2014-11-22 ENCOUNTER — Other Ambulatory Visit: Payer: Self-pay | Admitting: Cardiology

## 2014-12-08 ENCOUNTER — Ambulatory Visit: Payer: BC Managed Care – PPO | Admitting: Cardiology

## 2014-12-29 ENCOUNTER — Ambulatory Visit (INDEPENDENT_AMBULATORY_CARE_PROVIDER_SITE_OTHER): Payer: BLUE CROSS/BLUE SHIELD | Admitting: Pulmonary Disease

## 2014-12-29 ENCOUNTER — Encounter (INDEPENDENT_AMBULATORY_CARE_PROVIDER_SITE_OTHER): Payer: Self-pay

## 2014-12-29 ENCOUNTER — Encounter: Payer: Self-pay | Admitting: Pulmonary Disease

## 2014-12-29 VITALS — BP 142/74 | HR 63 | Temp 97.6°F | Ht 69.0 in | Wt 213.2 lb

## 2014-12-29 DIAGNOSIS — J438 Other emphysema: Secondary | ICD-10-CM

## 2014-12-29 NOTE — Assessment & Plan Note (Signed)
The patient overall is doing fairly well from a COPD standpoint. He was doing much better when he had almost quit smoking, but is now back to smoking his usual amount. He is also having issues with the temperature changes and pollen this spring, and is staying on Zyrtec daily. I have asked him to continue on his bronchodilator regimen, and to work on total smoking cessation.  I have given him the number to enroll in smoking cessation classes at the cancer center.

## 2014-12-29 NOTE — Progress Notes (Signed)
   Subjective:    Patient ID: Nicholas Camacho, male    DOB: 08/24/1951, 64 y.o.   MRN: [id number]  HPI The patient comes in today for follow-up of his known COPD. Overall, he has done fairly well on his current regimen, but unfortunately has not been able to stop smoking. He did much better when his smoking use was much less, but he is interested in going to smoking cessation classes. He did have an acute exacerbation in October of last year which required antibiotics and prednisone to improve. He has issues now with the change in weather and pollen, but is not overly dyspneic.   Review of Systems  Constitutional: Negative for fever and unexpected weight change.  HENT: Positive for congestion and postnasal drip. Negative for dental problem, ear pain, nosebleeds, rhinorrhea, sinus pressure, sneezing, sore throat and trouble swallowing.   Eyes: Negative for redness and itching.  Respiratory: Positive for cough, shortness of breath and wheezing. Negative for chest tightness.   Cardiovascular: Negative for palpitations and leg swelling.  Gastrointestinal: Negative for nausea and vomiting.  Genitourinary: Negative for dysuria.  Musculoskeletal: Negative for joint swelling.  Skin: Negative for rash.  Neurological: Negative for headaches.  Hematological: Does not bruise/bleed easily.  Psychiatric/Behavioral: Negative for dysphoric mood. The patient is not nervous/anxious.        Objective:   Physical Exam Well-developed male in no acute distress Nose without purulence or discharge noted Neck without lymphadenopathy or thyromegaly Chest with mild rhonchi, but adequate airflow and no wheezing Cardiac exam with regular rate and rhythm Lower extremities without edema, no cyanosis Alert and oriented, moves all 4 extremities.       Assessment & Plan:

## 2014-12-29 NOTE — Patient Instructions (Signed)
Continue on your current medications Work on total smoking cessation.  Please call the number given to consider smoking cessation classes. Stay as active as possible. followup with me again in 6mos, but call if having breathing issues.

## 2015-01-03 ENCOUNTER — Ambulatory Visit: Payer: Self-pay | Admitting: Cardiology

## 2015-01-11 ENCOUNTER — Encounter: Payer: Self-pay | Admitting: Cardiology

## 2015-01-11 ENCOUNTER — Ambulatory Visit (INDEPENDENT_AMBULATORY_CARE_PROVIDER_SITE_OTHER): Payer: BLUE CROSS/BLUE SHIELD | Admitting: Cardiology

## 2015-01-11 VITALS — BP 148/84 | HR 69 | Ht 69.0 in | Wt 211.0 lb

## 2015-01-11 DIAGNOSIS — I714 Abdominal aortic aneurysm, without rupture, unspecified: Secondary | ICD-10-CM

## 2015-01-11 DIAGNOSIS — I4891 Unspecified atrial fibrillation: Secondary | ICD-10-CM

## 2015-01-11 DIAGNOSIS — J438 Other emphysema: Secondary | ICD-10-CM

## 2015-01-11 DIAGNOSIS — I10 Essential (primary) hypertension: Secondary | ICD-10-CM

## 2015-01-11 DIAGNOSIS — R0600 Dyspnea, unspecified: Secondary | ICD-10-CM

## 2015-01-11 DIAGNOSIS — R0609 Other forms of dyspnea: Secondary | ICD-10-CM | POA: Diagnosis not present

## 2015-01-11 DIAGNOSIS — E785 Hyperlipidemia, unspecified: Secondary | ICD-10-CM

## 2015-01-11 LAB — LIPID PANEL
Cholesterol: 111 mg/dL (ref 0–200)
HDL: 34.2 mg/dL — ABNORMAL LOW (ref >39.00)
LDL Cholesterol: 68 mg/dL (ref 0–99)
NonHDL: 76.8
Total CHOL/HDL Ratio: 3
Triglycerides: 43 mg/dL (ref 0.0–149.0)
VLDL: 8.6 mg/dL (ref 0.0–40.0)

## 2015-01-11 LAB — COMPREHENSIVE METABOLIC PANEL
ALT: 14 U/L (ref 0–53)
AST: 14 U/L (ref 0–37)
Albumin: 4.2 g/dL (ref 3.5–5.2)
Alkaline Phosphatase: 111 U/L (ref 39–117)
BUN: 16 mg/dL (ref 6–23)
CO2: 27 mEq/L (ref 19–32)
Calcium: 9.6 mg/dL (ref 8.4–10.5)
Chloride: 105 mEq/L (ref 96–112)
Creatinine, Ser: 0.93 mg/dL (ref 0.40–1.50)
GFR: 87.21 mL/min (ref >60.00)
Glucose, Bld: 104 mg/dL — ABNORMAL HIGH (ref 70–99)
Potassium: 4.4 mEq/L (ref 3.5–5.1)
Sodium: 137 mEq/L (ref 135–145)
Total Bilirubin: 0.3 mg/dL (ref 0.2–1.2)
Total Protein: 7.3 g/dL (ref 6.0–8.3)

## 2015-01-11 LAB — CBC WITH DIFFERENTIAL/PLATELET
Basophils Absolute: 0 10*3/uL (ref 0.0–0.1)
Basophils Relative: 0.5 % (ref 0.0–3.0)
Eosinophils Absolute: 0.2 10*3/uL (ref 0.0–0.7)
Eosinophils Relative: 2.2 % (ref 0.0–5.0)
HCT: 33.8 % — ABNORMAL LOW (ref 39.0–52.0)
Hemoglobin: 10.8 g/dL — ABNORMAL LOW (ref 13.0–17.0)
Lymphocytes Relative: 21 % (ref 12.0–46.0)
Lymphs Abs: 1.8 10*3/uL (ref 0.7–4.0)
MCHC: 31.9 g/dL (ref 30.0–36.0)
MCV: 75.8 fl — ABNORMAL LOW (ref 78.0–100.0)
Monocytes Absolute: 0.7 10*3/uL (ref 0.1–1.0)
Monocytes Relative: 7.9 % (ref 3.0–12.0)
Neutro Abs: 5.8 10*3/uL (ref 1.4–7.7)
Neutrophils Relative %: 68.4 % (ref 43.0–77.0)
Platelets: 356 10*3/uL (ref 150.0–400.0)
RBC: 4.46 Mil/uL (ref 4.22–5.81)
RDW: 17.8 % — ABNORMAL HIGH (ref 11.5–15.5)
WBC: 8.5 10*3/uL (ref 4.0–10.5)

## 2015-01-11 NOTE — Patient Instructions (Signed)
Medication Instructions:  Your physician recommends that you continue on your current medications as directed. Please refer to the Current Medication list given to you today.   Labwork:  TODAY---CHECK CBC W DIFF, CMET, LIPIDS   Testing/Procedures:  Your physician has requested that you have en exercise stress myoview. For further information please visit [email or web address]. Please follow instruction sheet, as given.    Follow-Up: Your physician wants you to follow-up in: ONE YEAR WITH DR Johnell ComingsNELSON You will receive a reminder letter in the mail two months in advance. If you don't receive a letter, please call our office to schedule the follow-up appointment.

## 2015-01-11 NOTE — Progress Notes (Signed)
Patient ID: Nicholas NakayamaCharles M Scouten, male   DOB: 06/02/1951, 64 y.o.   MRN: [id number]    Patient Name: Nicholas Camacho Date of Encounter: 01/11/2015  Primary Care Provider:  [name], [name], MD Primary Cardiologist:  Lars MassonNELSON, Falisha Osment H  Problem List   Past Medical History  Diagnosis Date  . Elevated cholesterol   . Disturbance of skin sensation 06/24/2013  . Sleep disturbance, unspecified 06/24/2013   Past Surgical History  Procedure Laterality Date  . Fractured pelvis    . Reconstruct left heel  2002  . Tonsillectomy    . Inguinal hernia repair Bilateral    Allergies  Allergies  Allergen Reactions  . Morphine And Related     HALLUCINATIONS   Chief complain: Dyspnea on exertion, shortness of breath  HPI  A 64 year old male with h/o hypertension, hyperlipidemia, COPD and recent intermittent left hand numbness, tingling and weakness with ? TIA. CT and MRI of the brain were normal.EMG and nerve conduction studies showed evidence of mild bilateral carpal tunnel syndrome. He was also evaluated for OSA and was told that he had a short run of atrial fibrillation. The patient is not aware of palpitations and denies any prior syncope, CP or SOB. He is ongoing smoker.  01/11/2015 - the patient is coming after one year, he was able to cut down on smoking significantly from previous 2 packs a day to 1-2 or no cigarettes every day but is not able to stop completely. The patient has been dealing with ongoing dyspnea that he attributes to COPD but overall has noticed decreased exercise capacity and worsening dyspnea on exertion on mild to moderate activity like working in his backyard. In the past inhaler would help but now it's beyond that and he gets short of breath almost with any activity and a day. He denies any chest pain palpitations or syncope. He is compliant with Xarelto and hasn't noticed any significant bleeding other than prolonged losing when he cut himself. He denies any orthopnea or  proximal nocturnal dyspnea and has no lower extremity edema.  Home Medications  Prior to Admission medications   Medication Sig Start Date End Date Taking? Authorizing Provider  aspirin 325 MG EC tablet Take 325 mg by mouth daily.   Yes Historical Provider, MD  lisinopril (PRINIVIL,ZESTRIL) 10 MG tablet Take 1 tablet by mouth daily. 07/28/13  Yes Historical Provider, MD  Racepinephrine HCl (ASTHMANEFRIN REFILL) 2.25 % NEBU nebulizer solution Take 0.5 mLs by nebulization every 4 (four) hours as needed.   Yes Historical Provider, MD  atorvastatin (LIPITOR) 10 MG tablet Take 10 mg by mouth daily. 06/12/13   Historical Provider, MD    Family History  Family History  Problem Relation Age of Onset  . Diabetes Mother   . Prostate cancer Father   . Kidney failure Father   . Alcoholism Sister     history of  . Drug abuse Sister     history of    Social History  History   Social History  . Marital Status: Married    Spouse Name: N/A  . Number of Children: 2  . Years of Education: COLLEGE   Occupational History  . pest control    Social History Main Topics  . Smoking status: Current Every Day Smoker -- 1.50 packs/day for 45 years    Types: Cigarettes  . Smokeless tobacco: Current User     Comment: PT CURRENTLY SMOKING 0-1 cigs DAILY!!!  . Alcohol Use: No  . Drug  Use: No  . Sexual Activity: Not on file   Other Topics Concern  . Not on file   Social History Narrative     Review of Systems, as per HPI, otherwise negative General:  No chills, fever, night sweats or weight changes.  Cardiovascular:  No chest pain, dyspnea on exertion, edema, orthopnea, palpitations, paroxysmal nocturnal dyspnea. Dermatological: No rash, lesions/masses Respiratory: No cough, dyspnea Urologic: No hematuria, dysuria Abdominal:   No nausea, vomiting, diarrhea, bright red blood per rectum, melena, or hematemesis Neurologic:  No visual changes, wkns, changes in mental status. All other systems  reviewed and are otherwise negative except as noted above.  Physical Exam  Blood pressure 148/84, pulse 69, height  (1.753 m), weight 211 lb (95.709 kg).  General: Pleasant, NAD Psych: Normal affect. Neuro: Alert and oriented X 3. Moves all extremities spontaneously. HEENT: Normal  Neck: Supple without bruits or JVD. Lungs:  Resp regular and unlabored, expiratory wheezes bilaterally.[name] Heart: RRR no s3, s4, or murmurs. Abdomen: Soft, non-tender, non-distended, BS + x 4.  Extremities: No clubbing, cyanosis or edema. DP/PT/Radials 2+ and equal bilaterally.  Labs:  No results for input(s): CKTOTAL, CKMB, TROPONINI in the last 72 hours. Lab Results  Component Value Date   WBC 7.0 09/29/2013   No results for input(s): NA, K, CL, CO2, BUN, CREATININE, CALCIUM, PROT, BILITOT, ALKPHOS, ALT, AST, GLUCOSE in the last 168 hours.  Invalid input(s): LABALBU No results found for: CHOL No results found for: DDIMER Invalid input(s): POCBNP  Accessory Clinical Findings  Echocardiogram - 09/28/2013 Study Conclusions  - Left ventricle: The cavity size was normal. Systolic function was normal. The estimated ejection fraction was in the range of 60% to 65%. Wall motion was normal; there were no regional wall motion abnormalities. - Aortic valve: Mild regurgitation. - Atrial septum: There was increased thickness of the septum, consistent with lipomatous hypertrophy.  ECG - normal sinus rhythm, rightward axis, incomplete right bundle branch block, borderline EKG    Assessment & Plan  A 64 year male   1. Dyspnea on exertion - the patient has significant COPD however this dyspnea is Worsening and exertional. We will schedule an exercise nuclear stress test, Lexiscan not acceptable as the patient is wheezing. If he is not able to exercise we will perform dobutamine echocardiography.  2. H/O TIA - Upper extremity tingling, weakness - normal MRI, a-fib found incidentally during sleep  study. A 48 Hour Holter monitor showed 12 short episodes of atrial fibrillation, the longest was 21 beats, echo showed normal LVEF and normal LA size. The patient was started on Xarelto (CHADS-VASc 2) and Cardizem, repeated 48 hour Holter on 2/9-07/2014 showed 11 episodes of a-fib, the longest lasting 22 beats.  We will continue the same regimen.  3. COPD improved wheezing - the patient is using Spiriva, Dulera and when necessary albuterol.   4. OSA - the patient is getting a new CPAP machine, he was found to desaturate to 80% at night. This is most certainly contributing to his a-fib episodes.   5. Hypertension - controlled, continue cardizem CD 120 mg PO daily.  6. Hyperlipidemia - on atorvastatin 10 mg po daily, we will recheck today.  7. 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 The patient is cutting down on smoking, currently 3-4 cigarettes from 1 PPD.  We will schedule an exercise nuclear stress test, check competency  metabolic profile CBC and lipids today, we will also schedule ultrasound for incidental finding of AAA a year ago.  F/u in 1 year if results normal..   Lars Masson, MD, [facility] 01/11/2015, 12:01 PM

## 2015-01-11 NOTE — Addendum Note (Signed)
Addended by: Loa SocksMARTIN, Conleigh Heinlein M on: 01/11/2015 01:29 PM   Modules accepted: Orders

## 2015-01-24 ENCOUNTER — Other Ambulatory Visit: Payer: Self-pay | Admitting: Cardiology

## 2015-01-26 ENCOUNTER — Ambulatory Visit (HOSPITAL_COMMUNITY): Payer: BLUE CROSS/BLUE SHIELD | Attending: Cardiology | Admitting: Radiology

## 2015-01-26 DIAGNOSIS — R079 Chest pain, unspecified: Secondary | ICD-10-CM | POA: Diagnosis present

## 2015-01-26 DIAGNOSIS — R002 Palpitations: Secondary | ICD-10-CM | POA: Insufficient documentation

## 2015-01-26 DIAGNOSIS — Z720 Tobacco use: Secondary | ICD-10-CM | POA: Insufficient documentation

## 2015-01-26 DIAGNOSIS — I10 Essential (primary) hypertension: Secondary | ICD-10-CM | POA: Insufficient documentation

## 2015-01-26 DIAGNOSIS — R0609 Other forms of dyspnea: Secondary | ICD-10-CM

## 2015-01-26 DIAGNOSIS — I4891 Unspecified atrial fibrillation: Secondary | ICD-10-CM | POA: Diagnosis not present

## 2015-01-26 DIAGNOSIS — R0602 Shortness of breath: Secondary | ICD-10-CM | POA: Diagnosis not present

## 2015-01-26 DIAGNOSIS — R0600 Dyspnea, unspecified: Secondary | ICD-10-CM

## 2015-01-26 DIAGNOSIS — J438 Other emphysema: Secondary | ICD-10-CM

## 2015-01-26 MED ORDER — TECHNETIUM TC 99M SESTAMIBI GENERIC - CARDIOLITE
33.0000 | Freq: Once | INTRAVENOUS | Status: AC | PRN
  Administered 2015-01-26: 33 via INTRAVENOUS

## 2015-01-26 MED ORDER — TECHNETIUM TC 99M SESTAMIBI GENERIC - CARDIOLITE
11.0000 | Freq: Once | INTRAVENOUS | Status: AC | PRN
  Administered 2015-01-26: 11 via INTRAVENOUS

## 2015-01-26 MED ORDER — REGADENOSON 0.4 MG/5ML IV SOLN
0.4000 mg | Freq: Once | INTRAVENOUS | Status: AC
  Administered 2015-01-26: 0.4 mg via INTRAVENOUS

## 2015-01-26 NOTE — Progress Notes (Signed)
[facility] SITE 3 NUCLEAR MED [address] [place], [place] [id number] [phone]    Cardiology Nuclear Med Study  Nicholas Camacho is a 64 y.o. male     MRN : [id number]     DOB: 05/20/1951  Procedure Date: 01/26/2015  Nuclear Med Background Indication for Stress Test:  Evaluation for Ischemia History:  COPD and H/O AFIB Cardiac Risk Factors: Hypertension  Symptoms:  Chest Pain, DOE, Palpitations and SOB   Nuclear Pre-Procedure Caffeine/Decaff Intake:  None NPO After: 7:00pm   Lungs:  clear O2 Sat: 87% on room air. IV 0.9% NS with Angio Cath:  22g  IV Site: R Hand  IV Started by:  Bonnita LevanJackie Delane Stalling, RN  Chest Size (in):  46 Cup Size: n/a  Height: 5\' 9"  (1.753 m)  Weight:  211 lb (95.709 kg)  BMI:  Body mass index is 31.15 kg/(m^2). Tech Comments:  N/A    Nuclear Med Study 1 or 2 day study: 1 day  Stress Test Type:  Stress  Reading MD: N/A  Order Authorizing Provider:  Tobias AlexanderKatarina Nelson, MD  Resting Radionuclide: Technetium 3285m Sestamibi  Resting Radionuclide Dose: 11.0 mCi   Stress Radionuclide:  Technetium 6885m Sestamibi  Stress Radionuclide Dose: 33.0 mCi           Stress Protocol Rest HR: 61 Stress HR: 80  Rest BP: 111/67 Stress BP: 106/76  Exercise Time (min): n/a METS: n/a   Predicted Max HR: 157 bpm % Max HR: 50.96 bpm Rate Pressure Product: 8880   Dose of Adenosine (mg):  n/a Dose of Lexiscan: 0.4 mg  Dose of Atropine (mg): n/a Dose of Dobutamine: n/a mcg/kg/min (at max HR)  Stress Test Technologist: Milana NaSabrina Williams, EMT-P  Nuclear Technologist:  Kerby NoraElzbieta Kubak, CNMT     Rest Procedure:  Myocardial perfusion imaging was performed at rest 45 minutes following the intravenous administration of Technetium 4785m Sestamibi. Rest ECG: Normal sinus rhythm. Normal EKG.  Stress Procedure:  The patient received IV Lexiscan 0.4 mg over 15-seconds.  Technetium 5185m Sestamibi injected at 30-seconds. This patient was sob, dizzy, and lt. Headed with the Lexiscan  injection. Quantitative spect images were obtained after a 45 minute delay. Stress ECG: No significant ST segment change suggestive of ischemia.  QPS Raw Data Images:  Normal; no motion artifact; normal heart/lung ratio. Stress Images:  There is a medium-sized area of moderate decrease in uptake affecting the base/apical inferior segments,  base inferolateral segment, lateral apical segment, apical cap. Rest Images:  Medium-sized area of moderate decrease in uptake in the segments similar to the stress images. The area of the defect is slightly smaller. Subtraction (SDS):  Small area of mild reversibility. Transient Ischemic Dilatation (Normal <1.22):  1.09 Lung/Heart Ratio (Normal <0.45):  0.34  Quantitative Gated Spect Images QGS EDV:  135 ml QGS ESV:  61 ml  Impression Exercise Capacity:  Lexiscan with no exercise. BP Response:  Normal blood pressure response. Clinical Symptoms:  Shortness of breath ECG Impression:  No significant ST segment change suggestive of ischemia. Comparison with Prior Nuclear Study: No images to compare  Overall Impression:  There is decreased activity in the inferior wall. Wall motion is good. It is possible that this represents diaphragmatic attenuation. However I cannot rule out the possibility of mild scar with very slight ischemia in the inferior wall. This is a low risk scan.  LV Ejection Fraction: 55%.  LV Wall Motion:  There are no obvious significant wall motion abnormalities.

## 2015-01-28 ENCOUNTER — Ambulatory Visit (HOSPITAL_COMMUNITY): Payer: BLUE CROSS/BLUE SHIELD | Attending: Cardiovascular Disease | Admitting: Cardiology

## 2015-01-28 DIAGNOSIS — I723 Aneurysm of iliac artery: Secondary | ICD-10-CM

## 2015-01-28 DIAGNOSIS — I714 Abdominal aortic aneurysm, without rupture, unspecified: Secondary | ICD-10-CM

## 2015-01-28 NOTE — Progress Notes (Signed)
Aorta duplex performed. 

## 2015-02-24 ENCOUNTER — Other Ambulatory Visit: Payer: Self-pay | Admitting: Cardiology

## 2015-04-26 ENCOUNTER — Telehealth: Payer: Self-pay | Admitting: Pulmonary Disease

## 2015-04-26 MED ORDER — MOMETASONE FURO-FORMOTEROL FUM 100-5 MCG/ACT IN AERO: 2.0000 | INHALATION_SPRAY | Freq: Two times a day (BID) | RESPIRATORY_TRACT | Status: DC

## 2015-04-26 NOTE — Telephone Encounter (Signed)
I called spoke with pt. I have [facility]. Nothing further needed

## 2015-05-19 ENCOUNTER — Other Ambulatory Visit: Payer: Self-pay | Admitting: Cardiology

## 2015-05-27 ENCOUNTER — Other Ambulatory Visit: Payer: Self-pay | Admitting: Cardiology

## 2015-06-10 ENCOUNTER — Other Ambulatory Visit: Payer: Self-pay

## 2015-06-10 MED ORDER — TIOTROPIUM BROMIDE MONOHYDRATE 2.5 MCG/ACT IN AERS: 2.0000 | INHALATION_SPRAY | Freq: Every day | RESPIRATORY_TRACT | Status: DC

## 2015-06-20 ENCOUNTER — Ambulatory Visit (INDEPENDENT_AMBULATORY_CARE_PROVIDER_SITE_OTHER): Payer: BLUE CROSS/BLUE SHIELD | Admitting: Pulmonary Disease

## 2015-06-20 ENCOUNTER — Other Ambulatory Visit (INDEPENDENT_AMBULATORY_CARE_PROVIDER_SITE_OTHER): Payer: BLUE CROSS/BLUE SHIELD

## 2015-06-20 ENCOUNTER — Encounter: Payer: Self-pay | Admitting: Pulmonary Disease

## 2015-06-20 VITALS — BP 134/70 | HR 75 | Ht 69.0 in | Wt 212.0 lb

## 2015-06-20 DIAGNOSIS — F17200 Nicotine dependence, unspecified, uncomplicated: Secondary | ICD-10-CM

## 2015-06-20 DIAGNOSIS — J438 Other emphysema: Secondary | ICD-10-CM | POA: Diagnosis not present

## 2015-06-20 DIAGNOSIS — Z23 Encounter for immunization: Secondary | ICD-10-CM | POA: Diagnosis not present

## 2015-06-20 DIAGNOSIS — M7989 Other specified soft tissue disorders: Secondary | ICD-10-CM

## 2015-06-20 DIAGNOSIS — Z720 Tobacco use: Secondary | ICD-10-CM | POA: Diagnosis not present

## 2015-06-20 LAB — BASIC METABOLIC PANEL
BUN: 18 mg/dL (ref 6–23)
CALCIUM: 9.2 mg/dL (ref 8.4–10.5)
CO2: 25 mEq/L (ref 19–32)
CREATININE: 0.87 mg/dL (ref 0.40–1.50)
Chloride: 104 mEq/L (ref 96–112)
GFR: 94.06 mL/min (ref >60.00)
Glucose, Bld: 111 mg/dL — ABNORMAL HIGH (ref 70–99)
Potassium: 4.1 mEq/L (ref 3.5–5.1)
Sodium: 137 mEq/L (ref 135–145)

## 2015-06-20 LAB — ALBUMIN: ALBUMIN: 4.1 g/dL (ref 3.5–5.2)

## 2015-06-20 LAB — BRAIN NATRIURETIC PEPTIDE: PRO B NATRI PEPTIDE: 20 pg/mL (ref 0.0–100.0)

## 2015-06-20 MED ORDER — VARENICLINE TARTRATE 0.5 MG X 11 & 1 MG X 42 PO MISC: ORAL | Status: DC

## 2015-06-20 NOTE — Assessment & Plan Note (Signed)
He was counseled today for more than 5 minutes on his tobacco abuse. I explained to him that this is causing his COPD to decline any more rapid rate. He is willing to quit. He has used Chantix in the past without side effects. He would like to use that again.  Plan: Chantix again Consider Nicotrol if no improvement with Chantix

## 2015-06-20 NOTE — Assessment & Plan Note (Signed)
Today his lungs had some mild wheezing on exam which is consistent with his moderate COPD. He has been experiencing some increase in dyspnea which is likely due to increased mucus burden from starting smoking again.  Today he was counseled at length today to quit smoking. Otherwise, this has been a stable interval as he has not had an exacerbation since the last visit.  Plan: Continue Spiriva Quit smoking Flu shot today Follow-up 6 months

## 2015-06-20 NOTE — Assessment & Plan Note (Signed)
This is a new problem which he developed yesterday and now seems to be improving somewhat. It was not associated with increasing shortness of breath or chest pain. He is concerned about it however because he's been experiencing bilateral hand tingling and he wonders if the 2 are related. I explained to him that thyroid disease can sometimes cause this combination of symptoms but the most likely explanation is chronic venous insufficiency.  Renal failure, hypoalbuminemia, and heart failure are in the ddx, but he seems volume replete on exam.  Plan Check BMET, albumin, BNP Elevate legs To PCP if no improvement

## 2015-06-20 NOTE — Progress Notes (Signed)
Subjective:    Patient ID: Nicholas Camacho, male    DOB: 07-30-1951, 64 y.o.   MRN: [id number]  Synopsis: Former patient of Dr. Shelle Iron has COPD. PFT's 10/2013:  FEV1 2.38 (72%), ratio 58, no BD response, TLC normal, DLCO 72%  HPI Chief Complaint  Patient presents with  . Follow-up    former [facility] pt being treated for emphysema.  Pt c/o increasing DOE.  Pt has started smoking since last ov.      He says that he started smoking since the last visit. He says that this has been associated with increasing shortness of breath. He has a cough as well. No chest tightness, no chest pain. His weight has been stable. He says that he noticed some shortness of breath yesterday when he climbed the stairs. He also says he's been having some numbness and tingling in his hands which his primary care physician has treated with B12 shots. Yesterday he had some leg swelling. He says that this made him nervous that he may have had a heart attack. Again, he denies chest pain.  Past Medical History  Diagnosis Date  . Elevated cholesterol   . Disturbance of skin sensation 06/24/2013  . Sleep disturbance, unspecified 06/24/2013      Review of Systems  Constitutional: Negative for fever, chills and fatigue.  HENT: Negative for rhinorrhea, sinus pressure and sneezing.   Respiratory: Positive for shortness of breath. Negative for cough and wheezing.   Cardiovascular: Positive for leg swelling. Negative for chest pain and palpitations.       Objective:   Physical Exam Filed Vitals:   06/20/15 1350  BP: 134/70  Pulse: 75  Height:  (1.753 m)  Weight: 212 lb (96.163 kg)  SpO2: 95%   RA  Gen: well appearing HENT: OP clear, , neck supple PULM: wheezing right upper lobe cleared with a few deep breaths, normal air movement and effort CV: RRR, no mgr, trace edema GI: BS+, soft, nontender Derm: no cyanosis or rash Psyche: normal mood and affect   December 2015 chest x-ray images with emphysema  but no other acute abnormality April 2016 labwork showed normal renal function.     Assessment & Plan:  COPD (chronic obstructive pulmonary disease) Today his lungs had some mild wheezing on exam which is consistent with his moderate COPD. He has been experiencing some increase in dyspnea which is likely due to increased mucus burden from starting smoking again.  Today he was counseled at length today to quit smoking. Otherwise, this has been a stable interval as he has not had an exacerbation since the last visit.  Plan: Continue Spiriva Quit smoking Flu shot today Follow-up 6 months  Smoking He was counseled today for more than 5 minutes on his tobacco abuse. I explained to him that this is causing his COPD to decline any more rapid rate. He is willing to quit. He has used Chantix in the past without side effects. He would like to use that again.  Plan: Chantix again Consider Nicotrol if no improvement with Chantix  Leg swelling This is a new problem which he developed yesterday and now seems to be improving somewhat. It was not associated with increasing shortness of breath or chest pain. He is concerned about it however because he's been experiencing bilateral hand tingling and he wonders if the 2 are related. I explained to him that thyroid disease can sometimes cause this combination of symptoms but the most likely explanation is chronic  venous insufficiency.  Renal failure, hypoalbuminemia, and heart failure are in the ddx, but he seems volume replete on exam.  Plan Check BMET, albumin, BNP Elevate legs To PCP if no improvement     Current outpatient prescriptions:  .  aspirin 81 MG tablet, Take 81 mg by mouth daily., Disp: , Rfl:  .  atorvastatin (LIPITOR) 10 MG tablet, Take 10 mg by mouth daily., Disp: , Rfl:  .  CARTIA XT 120 MG 24 hr capsule, TAKE 1 CAPSULE DAILY, Disp: 90 capsule, Rfl: 1 .  cetirizine (ZYRTEC) 10 MG tablet, Take 10 mg by mouth daily., Disp: , Rfl:    .  mometasone-formoterol (DULERA) 100-5 MCG/ACT AERO, Inhale 2 puffs into the lungs 2 (two) times daily., Disp: 3 Inhaler, Rfl: 1 .  naproxen sodium (ANAPROX) 220 MG tablet, Take 220 mg by mouth as needed., Disp: , Rfl:  .  PROAIR HFA 108 (90 BASE) MCG/ACT inhaler, USE 2 INHALATIONS EVERY 6 HOURS AS NEEDED FOR WHEEZING OR SHORTNESS OF BREATH, Disp: 8.5 g, Rfl: 6 .  Tiotropium Bromide Monohydrate (SPIRIVA RESPIMAT) 2.5 MCG/ACT AERS, Inhale 2 puffs into the lungs daily., Disp: 3 Inhaler, Rfl: 3 .  vitamin B-12 (CYANOCOBALAMIN) 1000 MCG tablet, Take 2,000 mcg by mouth daily., Disp: , Rfl:  .  XARELTO 20 MG TABS tablet, TAKE 1 TABLET DAILY WITH SUPPER, Disp: 90 tablet, Rfl: 1 .  varenicline (CHANTIX PAK) 0.5 MG X 11 & 1 MG X 42 tablet, Take one 0.5 mg tablet by mouth once daily for 3 days, then increase to one 0.5 mg tablet twice daily for 4 days, then increase to one 1 mg tablet twice daily., Disp: 53 tablet, Rfl: 2

## 2015-06-20 NOTE — Patient Instructions (Signed)
Take Chantix 0.5 mg daily for 3 days, then 0.5 mg twice a day for 4 days, then 1 mg twice a day. On the day you take 1 mg twice a day quit smoking. Keep using your Spiriva as you're doing We will call you with the results of today's blood work Keep your feet elevated, if no improvement then discuss further your primary care physician. We will see you back in 6 months are seen her if needed

## 2015-06-22 ENCOUNTER — Telehealth: Payer: Self-pay | Admitting: Pulmonary Disease

## 2015-06-22 NOTE — Telephone Encounter (Signed)
Per lab results: Please let the patient know this was OK ---  I spoke with patient about results and he verbalized understanding and had no questions.

## 2015-11-04 ENCOUNTER — Ambulatory Visit (INDEPENDENT_AMBULATORY_CARE_PROVIDER_SITE_OTHER)
Admission: RE | Admit: 2015-11-04 | Discharge: 2015-11-04 | Disposition: A | Discharge status: Home / Self Care | Payer: BLUE CROSS/BLUE SHIELD | Source: Ambulatory Visit | Attending: Internal Medicine | Admitting: Internal Medicine

## 2015-11-04 ENCOUNTER — Telehealth: Payer: Self-pay | Admitting: Pulmonary Disease

## 2015-11-04 ENCOUNTER — Encounter: Payer: Self-pay | Admitting: Internal Medicine

## 2015-11-04 ENCOUNTER — Ambulatory Visit (INDEPENDENT_AMBULATORY_CARE_PROVIDER_SITE_OTHER): Payer: BLUE CROSS/BLUE SHIELD | Admitting: Internal Medicine

## 2015-11-04 VITALS — BP 126/70 | HR 70 | Temp 98.0°F | Ht 68.0 in | Wt 207.0 lb

## 2015-11-04 DIAGNOSIS — J449 Chronic obstructive pulmonary disease, unspecified: Secondary | ICD-10-CM | POA: Diagnosis not present

## 2015-11-04 DIAGNOSIS — F17210 Nicotine dependence, cigarettes, uncomplicated: Secondary | ICD-10-CM | POA: Diagnosis not present

## 2015-11-04 MED ORDER — PREDNISONE 10 MG PO TABS: ORAL_TABLET | ORAL | Status: DC

## 2015-11-04 MED ORDER — AZITHROMYCIN 250 MG PO TABS: ORAL_TABLET | ORAL | Status: DC

## 2015-11-04 MED ORDER — MOMETASONE FURO-FORMOTEROL FUM 200-5 MCG/ACT IN AERO: INHALATION_SPRAY | RESPIRATORY_TRACT | Status: DC

## 2015-11-04 NOTE — Telephone Encounter (Signed)
Called pt and scheduled appt to see MW this afternoon at 3:!5 for acute visit. Nothing further needed

## 2015-11-04 NOTE — Progress Notes (Signed)
   Subjective:    Patient ID: Nicholas Camacho, male    DOB: 05-11-1951,     MRN: [id number]  Synopsis: Former patient of Dr. Shelle Iron with COPD GOLD II  PFT's 10/2013:  FEV1 2.38 (72%), ratio 58, no BD response, TLC normal, DLCO 72%   11/04/2015 acute extended ov/Cheryllynn Sarff re: aecopd/ maint on dulera 100/spiriva respimat  Chief Complaint  Patient presents with  . Acute Visit    Pt c/o increased DOE x 3-5 days. He gets out of breath walking from room to room at home. He has also had some cough and wheezing. Cough is occ prod with min dark green sputum.   abrupt onset with head cold/ had been doing ok on dulera 100 and spiriva maint with no need for saba but still smoking Baseline doe = MMRC1 = can walk nl pace, flat grade, can't hurry or go uphills or steps s sob  Now room to room   No obvious day to day or daytime variability or assoc cp or chest tightness,  or overt sinus or hb symptoms. No unusual exp hx or h/o childhood pna/ asthma or knowledge of premature birth.  Sleeping ok without nocturnal  or early am exacerbation  of respiratory  c/o's or need for noct saba. Also denies any obvious fluctuation of symptoms with weather or environmental changes or other aggravating or alleviating factors except as outlined above   Current Medications, Allergies, Complete Past Medical History, Past Surgical History, Family History, and Social History were reviewed in [employer] record.  ROS  The following are not active complaints unless bolded sore throat, dysphagia, dental problems, itching, sneezing,  nasal congestion or excess/ purulent secretions, ear ache,   fever, chills, sweats, unintended wt loss, classically pleuritic or exertional cp, hemoptysis,  orthopnea pnd or leg swelling, presyncope, palpitations, abdominal pain, anorexia, nausea, vomiting, diarrhea  or change in bowel or bladder habits, change in stools or urine, dysuria,hematuria,  rash, arthralgias, visual complaints,  headache, numbness, weakness or ataxia or problems with walking or coordination,  change in mood/affect or memory.                   Objective:   Physical Exam   Gen: well appearing but quite somber  Wt Readings from Last 3 Encounters:  11/04/15 207 lb (93.895 kg)  06/20/15 212 lb (96.163 kg)  01/26/15 211 lb (95.709 kg)    Vital signs reviewed   HENT: OP clear, , neck supple/ full dentures  PULM: bilateral exp wheeze/ better with plm  CV: RRR, no mgr, trace edema GI: BS+, soft, nontender Derm: no cyanosis or rash Psyche: normal mood and affect    CXR PA and Lateral:   11/04/2015 :    I personally reviewed images and agree with radiology impression as follows:    No active cardiopulmonary disease. Mild hyperinflation again noted.    Assessment & Plan:

## 2015-11-04 NOTE — Patient Instructions (Addendum)
Zpak/ Prednisone 10 mg take  4 each am x 2 days,   2 each am x 2 days,  1 each am x 2 days and stop   Change Dulera to the 200 Take 2 puffs first thing in am and then another 2 puffs about 12 hours later.   Work on inhaler technique:  relax and gently blow all the way out then take a nice smooth deep breath back in, triggering the inhaler at same time you start breathing in.  Hold for up to 5 seconds if you can. Blow out thru nose. Rinse and gargle with water when done     The key is to stop smoking completely before smoking completely stops you!   Please remember to go to the  x-ray department downstairs for your tests - we will call you with the results when they are available.  Keep appt to see Dr Kendrick Fries next months as planned, call sooner if needed

## 2015-11-07 NOTE — Progress Notes (Signed)
Quick Note:  LMTCB ______ 

## 2015-11-08 ENCOUNTER — Telehealth: Payer: Self-pay | Admitting: Pulmonary Disease

## 2015-11-08 ENCOUNTER — Telehealth: Payer: Self-pay | Admitting: Internal Medicine

## 2015-11-08 MED ORDER — MOMETASONE FURO-FORMOTEROL FUM 200-5 MCG/ACT IN AERO: INHALATION_SPRAY | RESPIRATORY_TRACT | Status: DC

## 2015-11-08 NOTE — Telephone Encounter (Signed)
Notes Recorded by Nyoka Cowden, MD on 11/04/2015 at 5:15 PM Call pt: Reviewed cxr and no acute change so no change in recommendations made at ov ----------------- lmtcb X1 for pt.

## 2015-11-08 NOTE — Telephone Encounter (Signed)
Pt returned call. Spoke with patient and informed him of his cxr results/recs as stated by MW. Pt voiced his understanding and denied any questions/concerns.  Nothing further needed; will sign off.

## 2015-11-08 NOTE — Telephone Encounter (Signed)
lmtcb X2 for pt.  

## 2015-11-08 NOTE — Telephone Encounter (Signed)
Returned call, CB [phone]

## 2015-11-08 NOTE — Telephone Encounter (Signed)
lmtcb X1 for pt  

## 2015-11-08 NOTE — Telephone Encounter (Signed)
Pt returned call Spoke with patient who is requesting that we resend his Dulera to [facility] for a 90day supply rather than a 30day supply as was sent originally. Advised pt will do so  Rx sent correctly Nothing further needed, will sign off.

## 2015-11-12 NOTE — Assessment & Plan Note (Signed)
PFT's 10/2013:  FEV1 2.38 (72%), ratio 58, no BD response, TLC normal, DLCO 72%  Acute flare in pt with GOLD II criteria a baseline on lama/laba/ics but still smoking  DDX of  difficult airways management almost all start with A and  include Adherence, Ace Inhibitors, Acid Reflux, Active Sinus Disease, Alpha 1 Antitripsin deficiency, Anxiety masquerading as Airways dz,  ABPA,  Allergy(esp in young), Aspiration (esp in elderly), Adverse effects of meds,  Active smokers, A bunch of PE's (a small clot burden can't cause this syndrome unless there is already severe underlying pulm or vascular dz with poor reserve) plus two Bs  = Bronchiectasis and Beta blocker use..and one C= CHF  Adherence is always the initial "prime suspect" and is a multilayered concern that requires a "trust but verify" approach in every patient - starting with knowing how to use medications, especially inhalers, correctly, keeping up with refills and understanding the fundamental difference between maintenance and prns vs those medications only taken for a very short course and then stopped and not refilled.  - - The proper method of use, as well as anticipated side effects, of a metered-dose inhaler are discussed and demonstrated to the patient. Improved effectiveness after extensive coaching during this visit to a level of approximately 75 % from a baseline of 50 %   ?allergy/asthma component > doubt/ rx pred x 6 only   ? Active sinus dz > doubt > rx zpak only   Active smoking (see separate a/p)   I had an extended discussion with the patient reviewing all relevant studies completed to date and  lasting 15 to 20 minutes of a 25 minute visit    Each maintenance medication was reviewed in detail including most importantly the difference between maintenance and prns and under what circumstances the prns are to be triggered using an action plan format that is not reflected in the computer generated alphabetically organized AVS.     Please see instructions for details which were reviewed in writing and the patient given a copy highlighting the part that I personally wrote and discussed at today's ov.

## 2015-11-12 NOTE — Assessment & Plan Note (Signed)
>   3 m Discussed the risks and costs (both direct and indirect)  of smoking relative to the benefits of quitting but patient unwilling to commit at this point to a specific quit date.     

## 2015-11-15 ENCOUNTER — Other Ambulatory Visit: Payer: Self-pay | Admitting: Cardiology

## 2015-11-21 ENCOUNTER — Telehealth: Payer: Self-pay | Admitting: Pulmonary Disease

## 2015-11-21 ENCOUNTER — Other Ambulatory Visit: Payer: Self-pay | Admitting: Cardiology

## 2015-11-21 NOTE — Telephone Encounter (Signed)
Called spoke with pt. He reports he had couple episodes over the weekend where his O2 level was 85% RA w/ activity. As soon as he would rest it would come back up into the 90's. He is scheduled to see TP for acute visit tomorrow at 3:45 for an eval. Pt aware to seek emergency care if he worsens prior. Nothing further needed

## 2015-11-22 ENCOUNTER — Encounter: Payer: Self-pay | Admitting: Adult Health

## 2015-11-22 ENCOUNTER — Ambulatory Visit (INDEPENDENT_AMBULATORY_CARE_PROVIDER_SITE_OTHER): Payer: BLUE CROSS/BLUE SHIELD | Admitting: Adult Health

## 2015-11-22 VITALS — BP 138/64 | HR 66 | Temp 98.1°F | Ht 70.0 in | Wt 204.0 lb

## 2015-11-22 DIAGNOSIS — J449 Chronic obstructive pulmonary disease, unspecified: Secondary | ICD-10-CM

## 2015-11-22 MED ORDER — PREDNISONE 10 MG PO TABS: ORAL_TABLET | ORAL | Status: DC

## 2015-11-22 MED ORDER — LEVALBUTEROL HCL 0.63 MG/3ML IN NEBU
0.6300 mg | INHALATION_SOLUTION | Freq: Once | RESPIRATORY_TRACT | Status: AC
  Administered 2015-11-22: 0.63 mg via RESPIRATORY_TRACT

## 2015-11-22 NOTE — Patient Instructions (Addendum)
Prednisone taper over next week.  Mucinex DM Twice daily  As needed  Cough/congestion  Continue on Dulera and Spiriva.  Work on not smoking  follow up Dr. Kendrick Fries in 2 months and As needed   Please contact office for sooner follow up if symptoms do not improve or worsen or seek emergency care

## 2015-11-22 NOTE — Addendum Note (Signed)
Addended by: Karalee Height on: 11/22/2015 04:36 PM   Modules accepted: Orders

## 2015-11-22 NOTE — Progress Notes (Signed)
Subjective:    Patient ID: Nicholas Camacho, male    DOB: 04/06/1951,     MRN: [id number]  Synopsis: Former patient of Dr. Shelle Iron with COPD GOLD II  PFT's 10/2013:  FEV1 2.38 (72%), ratio 58, no BD response, TLC normal, DLCO 72%   11/04/2015 acute extended ov/Wert re: aecopd/ maint on dulera 100/spiriva respimat  Chief Complaint  Patient presents with  . Acute Visit    Pt c/o increased DOE x 3-5 days. He gets out of breath walking from room to room at home. He has also had some cough and wheezing. Cough is occ prod with min dark green sputum.   abrupt onset with head cold/ had been doing ok on dulera 100 and spiriva maint with no need for saba but still smoking Baseline doe = MMRC1 = can walk nl pace, flat grade, can't hurry or go uphills or steps s sob  Now room to room  >>Zpack and Pred taper , increaesd Dulera 200    11/22/2015 Acute OV  Pt presents for an acute office visit. Complains of increased SOB and occasional dry cough around 10/30/25. Denies any chest congestion/tightness, sinus pressure/congestion, fever, nausea or vomiting.  Seen 2 weeks ago, tx / w/ Zpack and prednisone taper.  Does not feel this helped at all.  Dulera was increased 200.  CXR with no acute process.  He is not feeling any better.  Walk test in office with no desats on room air with O2 sats ~96-97%.  Remains on Dulera and Spiriva .  On Xarelto for Atrial Fib.  Denies chest pain , orthopnea, edema , hemoptysis , hemoptysis, nv/d.     Current Medications, Allergies, Complete Past Medical History, Past Surgical History, Family History, and Social History were reviewed in [employer] record.  ROS  The following are not active complaints unless bolded sore throat, dysphagia, dental problems, itching, sneezing,  nasal congestion or excess/ purulent secretions, ear ache,   fever, chills, sweats, unintended wt loss, classically pleuritic or exertional cp, hemoptysis,  orthopnea pnd or leg  swelling, presyncope, palpitations, abdominal pain, anorexia, nausea, vomiting, diarrhea  or change in bowel or bladder habits, change in stools or urine, dysuria,hematuria,  rash, arthralgias, visual complaints, headache, numbness, weakness or ataxia or problems with walking or coordination,  change in mood/affect or memory.                   Objective:   Physical Exam   Gen: well appearing  Filed Vitals:   11/22/15 1550  BP: 138/64  Pulse: 66  Temp: 98.1 F (36.7 C)  TempSrc: Oral  Height:  (1.778 m)  Weight: 204 lb (92.534 kg)  SpO2: 92%      Vital signs reviewed GEN: A/Ox3; pleasant , NAD, elderly ,   HEENT:  [place]/AT,  EACs-clear, TMs-wnl, NOSE-clear, THROAT-clear, no lesions, no postnasal drip or exudate noted.   NECK:  Supple w/ fair ROM; no JVD; normal carotid impulses w/o bruits; no thyromegaly or nodules palpated; no lymphadenopathy.  RESP  Exp wheezing .no accessory muscle use, no dullness to percussion  CARD:  RRR, no m/r/g  , no peripheral edema, pulses intact, no cyanosis or clubbing.  GI:   Soft & nt; nml bowel sounds; no organomegaly or masses detected.  Musco: Warm bil, no deformities or joint swelling noted.   Neuro: alert, no focal deficits noted.    Skin: Warm, no lesions or rashes    CXR PA and  Lateral:   11/04/2015 :      No active cardiopulmonary disease. Mild hyperinflation again noted.    Assessment & Plan:

## 2015-11-22 NOTE — Assessment & Plan Note (Signed)
Slow to resolve exacerbation in pt with ongoing smoking  No desats with ambulation  Recent cxr w/ no acute process xopenex neb x 1 in office   Plan Prednisone taper over next week.  Mucinex DM Twice daily  As needed  Cough/congestion  Continue on Dulera and Spiriva.  Work on not smoking  follow up Dr. Kendrick Fries in 2 months and As needed   Please contact office for sooner follow up if symptoms do not improve or worsen or seek emergency care

## 2015-11-23 NOTE — Progress Notes (Signed)
Unfortunately he has an exacerbation.  AGree with the plan as outlined above. Heber , MD [place] [facility] Pager: [phone] Cell: [phone] After 3pm or if no response, call [phone]

## 2015-12-09 ENCOUNTER — Ambulatory Visit (INDEPENDENT_AMBULATORY_CARE_PROVIDER_SITE_OTHER): Payer: BLUE CROSS/BLUE SHIELD | Admitting: Pulmonary Disease

## 2015-12-09 ENCOUNTER — Encounter: Payer: Self-pay | Admitting: Pulmonary Disease

## 2015-12-09 VITALS — BP 140/78 | HR 75 | Ht 71.0 in | Wt 212.0 lb

## 2015-12-09 DIAGNOSIS — Z720 Tobacco use: Secondary | ICD-10-CM

## 2015-12-09 DIAGNOSIS — J449 Chronic obstructive pulmonary disease, unspecified: Secondary | ICD-10-CM | POA: Diagnosis not present

## 2015-12-09 DIAGNOSIS — R059 Cough, unspecified: Secondary | ICD-10-CM

## 2015-12-09 DIAGNOSIS — F17210 Nicotine dependence, cigarettes, uncomplicated: Secondary | ICD-10-CM

## 2015-12-09 DIAGNOSIS — R05 Cough: Secondary | ICD-10-CM | POA: Diagnosis not present

## 2015-12-09 MED ORDER — PREDNISONE 10 MG PO TABS: ORAL_TABLET | ORAL | Status: DC

## 2015-12-09 MED ORDER — HYDROCOD POLST-CPM POLST ER 10-8 MG/5ML PO SUER: 5.0000 mL | Freq: Two times a day (BID) | ORAL | Status: DC | PRN

## 2015-12-09 MED ORDER — ALBUTEROL SULFATE (2.5 MG/3ML) 0.083% IN NEBU: 2.5000 mg | INHALATION_SOLUTION | Freq: Four times a day (QID) | RESPIRATORY_TRACT | Status: DC | PRN

## 2015-12-09 NOTE — Assessment & Plan Note (Signed)
He is still having a prolonged exacerbation of COPD. Fortunately, he quit smoking. However, today on exam he still wheezing, has some chest congestion and reports to me significant shortness of breath. His recent chest x-ray images were personally reviewed showing emphysema with no infiltrate or mass.  Plan: Continue Dulera and Spiriva Prednisone taper again Add albuterol nebulizer to use on an as-needed basis at home, I have recommended that he use it twice a day for the next 4-5 days Follow-up in about 2 months to make sure things are getting better

## 2015-12-09 NOTE — Patient Instructions (Signed)
For the COPD exacerbation: Take the prednisone taper as prescribed Use albuterol twice a day for the next 4 days, then use it as needed after that for chest tightness, shortness of breath, or wheezing  For the cough: Take Tussionex twice a day to help suppress the cough You need to try to suppress your cough to allow your larynx (voice box) to heal.  For three days don't talk, laugh, sing, or clear your throat. Do everything you can to suppress the cough during this time. Use hard candies (sugarless Jolly Ranchers) or non-mint or non-menthol containing cough drops during this time to soothe your throat.  Use a cough suppressant (Delsym or what I have prescribed you) around the clock during this time.  After three days, gradually increase the use of your voice and back off on the cough suppressants.  I believe that acid reflux is contributing to your coughs when she was taken over-the-counter Prilosec once daily for the next month  F/u 6-8 weeks or sooner if needed

## 2015-12-09 NOTE — Assessment & Plan Note (Signed)
He has a persistent cough which is due to upper airway laryngeal irritation, acid reflux, and his persistent COPD exacerbation.  Plan: Voice rest was strongly encouraged Tussionex, he was advised not to use this and drive or take it with any sedating medications or alcohol Treat COPD exacerbation Start PPI

## 2015-12-09 NOTE — Progress Notes (Signed)
Subjective:    Patient ID: Nicholas Camacho, male    DOB: 09/12/1951, 65 y.o.   MRN: [id number]   Synopsis: Former patient of Dr. Shelle Ironlance has COPD.  He quit smoking in 11/2015. PFT's 10/2013:  FEV1 2.38 (72%), ratio 58, no BD response, TLC normal, DLCO 72%  HPI Chief Complaint  Patient presents with  . Follow-up    pt c/o sob with exertion, chest congestion-has difficulty getting mucus up.  Unable to lay flat d/t chest congestion.     Nicholas Camacho has been "rattling" in his chest more lately.  He tried to hot tea today with lemon which helped a lot.  He says that sweet tea and Dr. Reino KentPepper are his normal drinks during the day.  He has very little mucus production at all.  Mucinex seems to be suppressing.  Cough worse when lying flat.   He is still struggling with dyspnea with just minimal activity.  He says just walking through someone's house will make him more short of breath.    He doesn't recall having a fever or chills at all with any of this.  He feels a chill every now and then.   No hemoptysis, had one nose bleed.  He has had some post nasal drip.  He does have some heart burn from time to time.  He is voice is more hoarse.    He quit smoking 11/2015.    Past Medical History  Diagnosis Date  . Elevated cholesterol   . Disturbance of skin sensation 06/24/2013  . Sleep disturbance, unspecified 06/24/2013      Review of Systems  Constitutional: Negative for fever, chills and fatigue.  HENT: Negative for rhinorrhea, sinus pressure and sneezing.   Respiratory: Positive for shortness of breath. Negative for cough and wheezing.   Cardiovascular: Positive for leg swelling. Negative for chest pain and palpitations.       Objective:   Physical Exam Filed Vitals:   12/09/15 1635  BP: 140/78  Pulse: 75  Height: 5\' 11"  (1.803 m)  Weight: 212 lb (96.163 kg)  SpO2: 96%   RA  Gen: well appearing HENT: OP clear, , neck supple PULM: Wheezing throughout both lungs bilaterally with  prolonged expiratory phase CV: RRR, no mgr, trace edema GI: BS+, soft, nontender Derm: no cyanosis or rash Psyche: normal mood and affect   Records from a nurse practitioner reviewed where he was treated for a flare of COPD with prednisone and antibiotics February 2017 chest x-ray images personally reviewed showing emphysema but no infiltrate     Assessment & Plan:  COPD GOLD II He is still having a prolonged exacerbation of COPD. Fortunately, he quit smoking. However, today on exam he still wheezing, has some chest congestion and reports to me significant shortness of breath. His recent chest x-ray images were personally reviewed showing emphysema with no infiltrate or mass.  Plan: Continue Dulera and Spiriva Prednisone taper again Add albuterol nebulizer to use on an as-needed basis at home, I have recommended that he use it twice a day for the next 4-5 days Follow-up in about 2 months to make sure things are getting better  Cigarette smoker I congratulated him on quitting smoking today.  Cough He has a persistent cough which is due to upper airway laryngeal irritation, acid reflux, and his persistent COPD exacerbation.  Plan: Voice rest was strongly encouraged Tussionex, he was advised not to use this and drive or take it with any sedating medications or alcohol Treat  COPD exacerbation Start PPI     Current outpatient prescriptions:  .  aspirin 81 MG tablet, Take 81 mg by mouth daily., Disp: , Rfl:  .  atorvastatin (LIPITOR) 10 MG tablet, Take 10 mg by mouth daily., Disp: , Rfl:  .  CARTIA XT 120 MG 24 hr capsule, TAKE 1 CAPSULE DAILY, Disp: 90 capsule, Rfl: 0 .  cetirizine (ZYRTEC) 10 MG tablet, Take 10 mg by mouth daily., Disp: , Rfl:  .  mometasone-formoterol (DULERA) 200-5 MCG/ACT AERO, Take 2 puffs first thing in am and then another 2 puffs about 12 hours later., Disp: 3 Inhaler, Rfl: 3 .  naproxen sodium (ANAPROX) 220 MG tablet, Take 220 mg by mouth as needed.,  Disp: , Rfl:  .  PROAIR HFA 108 (90 BASE) MCG/ACT inhaler, USE 2 INHALATIONS EVERY 6 HOURS AS NEEDED FOR WHEEZING OR SHORTNESS OF BREATH, Disp: 8.5 g, Rfl: 6 .  rivaroxaban (XARELTO) 20 MG TABS tablet, TAKE 1 TABLET BY MOUTH DAILY WITH SUPPER, Disp: 90 tablet, Rfl: 0 .  Tiotropium Bromide Monohydrate (SPIRIVA RESPIMAT) 2.5 MCG/ACT AERS, Inhale 2 puffs into the lungs daily., Disp: 3 Inhaler, Rfl: 3 .  varenicline (CHANTIX PAK) 0.5 MG X 11 & 1 MG X 42 tablet, Take one 0.5 mg tablet by mouth once daily for 3 days, then increase to one 0.5 mg tablet twice daily for 4 days, then increase to one 1 mg tablet twice daily., Disp: 53 tablet, Rfl: 2 .  albuterol (PROVENTIL) (2.5 MG/3ML) 0.083% nebulizer solution, Take 3 mLs (2.5 mg total) by nebulization every 6 (six) hours as needed for wheezing or shortness of breath., Disp: 75 mL, Rfl: 12 .  chlorpheniramine-HYDROcodone (TUSSIONEX PENNKINETIC ER) 10-8 MG/5ML SUER, Take 5 mLs by mouth every 12 (twelve) hours as needed for cough., Disp: 140 mL, Rfl: 0 .  predniSONE (DELTASONE) 10 MG tablet, Take  po daily for 3 days, then take  po daily for 3 days, then take  po daily for two days, then take  po daily for 2 days, Disp: 27 tablet, Rfl: 0

## 2015-12-09 NOTE — Assessment & Plan Note (Signed)
I congratulated him on quitting smoking today.

## 2015-12-21 ENCOUNTER — Telehealth: Payer: Self-pay | Admitting: Pulmonary Disease

## 2015-12-21 NOTE — Telephone Encounter (Signed)
Pt c/o increased swelling in legs.  Pt reports swelling goes up to calf in both legs.  Reports some discomfort in legs and feels very tight/crampy.  Pt reports walking stiff legged.   Denies any increased SOB or chest discomfort. Please advise Dr Kendrick FriesMcQuaid. Thanks.

## 2015-12-21 NOTE — Telephone Encounter (Signed)
This is a problem that will need to be assessed in a clinic visit because he will need more history taking and lab work.

## 2015-12-21 NOTE — Telephone Encounter (Signed)
Pt scheduled in [place] with TP @ 945a for eval.  Nothing further needed.

## 2015-12-22 ENCOUNTER — Ambulatory Visit (INDEPENDENT_AMBULATORY_CARE_PROVIDER_SITE_OTHER): Payer: BLUE CROSS/BLUE SHIELD | Admitting: Adult Health

## 2015-12-22 ENCOUNTER — Encounter: Payer: Self-pay | Admitting: Adult Health

## 2015-12-22 ENCOUNTER — Other Ambulatory Visit (INDEPENDENT_AMBULATORY_CARE_PROVIDER_SITE_OTHER): Payer: BLUE CROSS/BLUE SHIELD

## 2015-12-22 VITALS — BP 143/68 | HR 76 | Temp 97.4°F | Ht 69.0 in | Wt 217.0 lb

## 2015-12-22 DIAGNOSIS — J449 Chronic obstructive pulmonary disease, unspecified: Secondary | ICD-10-CM

## 2015-12-22 DIAGNOSIS — R600 Localized edema: Secondary | ICD-10-CM

## 2015-12-22 DIAGNOSIS — M7989 Other specified soft tissue disorders: Secondary | ICD-10-CM | POA: Diagnosis not present

## 2015-12-22 LAB — BASIC METABOLIC PANEL
BUN: 16 mg/dL (ref 7–25)
CHLORIDE: 105 mmol/L (ref 98–110)
CO2: 24 mmol/L (ref 20–31)
CREATININE: 0.99 mg/dL (ref 0.70–1.25)
Calcium: 9 mg/dL (ref 8.6–10.3)
Glucose, Bld: 96 mg/dL (ref 65–99)
POTASSIUM: 3.8 mmol/L (ref 3.5–5.3)
Sodium: 138 mmol/L (ref 135–146)

## 2015-12-22 LAB — BRAIN NATRIURETIC PEPTIDE: Brain Natriuretic Peptide: 54 pg/mL (ref <100)

## 2015-12-22 MED ORDER — FUROSEMIDE 20 MG PO TABS: ORAL_TABLET | ORAL | Status: DC

## 2015-12-22 NOTE — Patient Instructions (Signed)
Stop Aleve/Naproxen .  Begin Lasix 20mg  2 tabs daily for 2 days then 1 tab daily for 1 week and then As needed  For leg swelling .  Labs today .  Follow up in 2 weeks with labs.  Please contact office for sooner follow up if symptoms do not improve or worsen or seek emergency care

## 2015-12-22 NOTE — Progress Notes (Signed)
   Subjective:    Patient ID: Nicholas Camacho, male    DOB: 09/26/1951,     MRN: [id number]  Synopsis: Former patient of Dr. Shelle Ironlance with COPD GOLD II  PFT's 10/2013:  FEV1 2.38 (72%), ratio 58, no BD response, TLC normal, DLCO 72%    12/22/2015 Acute OV : COPD  Pt presents for an acute office visit. Complains of leg swelling x 2 weeks, uncomfortable, but not painful. cramps in both legs, very tight. Had copd flare 1 month ago, took prednisone x 2 .  Says got better then legs started to swell  2 weeks. Ago.  CXR with no acute process in Jan.  Echo in 2014 with EF 60-65%.  On Xarelto for Atrial Fib.  Denies chest pain , orthopnea,  hemoptysis, nv/d. No sign increased DOE.  Taking Aleve for joint pain. Discussed no NSAIDS.       Current Medications, Allergies, Complete Past Medical History, Past Surgical History, Family History, and Social History were reviewed in [facility] electronic medical record.  ROS  The following are not active complaints unless bolded sore throat, dysphagia, dental problems, itching, sneezing,  nasal congestion or excess/ purulent secretions, ear ache,   fever, chills, sweats, unintended wt loss, classically pleuritic or exertional cp, hemoptysis,  orthopnea pnd or leg swelling, presyncope, palpitations, abdominal pain, anorexia, nausea, vomiting, diarrhea  or change in bowel or bladder habits, change in stools or urine, dysuria,hematuria,  rash, arthralgias, visual complaints, headache, numbness, weakness or ataxia or problems with walking or coordination,  change in mood/affect or memory.                   Objective:   Physical Exam   Gen: well appearing  Filed Vitals:   12/22/15 0944  BP: 143/68  Pulse: 76  Temp: 97.4 F (36.3 C)  TempSrc: Oral  Height: 5\' 9"  (1.753 m)  Weight: 217 lb (98.431 kg)  SpO2: 97%      Vital signs reviewed GEN: A/Ox3; pleasant , NAD, elderly ,   HEENT:  [place]/AT,  EACs-clear, TMs-wnl, NOSE-clear, THROAT-clear,  no lesions, no postnasal drip or exudate noted.   NECK:  Supple w/ fair ROM; no JVD; normal carotid impulses w/o bruits; no thyromegaly or nodules palpated; no lymphadenopathy.  RESP Decreased BS in bases  .no accessory muscle use, no dullness to percussion  CARD:  RRR, no m/r/g  , 1+ peripheral edema, pulses intact, no cyanosis or clubbing.  GI:   Soft & nt; nml bowel sounds; no organomegaly or masses detected.  Musco: Warm bil, no deformities or joint swelling noted.   Neuro: alert, no focal deficits noted.    Skin: Warm, no lesions or rashes    CXR PA and Lateral:   11/04/2015 :      No active cardiopulmonary disease. Mild hyperinflation again noted.  Marge Vandermeulen NP-C  [facility]  12/22/2015     Assessment & Plan:

## 2015-12-23 NOTE — Progress Notes (Signed)
Quick Note:  Called and spoke with pt. Reviewed results and recs. He voiced understanding and had no further questions. ______

## 2015-12-29 NOTE — Assessment & Plan Note (Signed)
LE swelling ? Mild fluid overload /Diastolic dysfunction  Check bnp  Mild diuresis  Would stop NSAIDS   Plan  Stop Aleve/Naproxen .  Begin Lasix 20mg  2 tabs daily for 2 days then 1 tab daily for 1 week and then As needed  For leg swelling .  Labs today .  Follow up in 2 weeks with labs.  Please contact office for sooner follow up if symptoms do not improve or worsen or seek emergency care

## 2015-12-29 NOTE — Assessment & Plan Note (Signed)
Recent flare now resolved   Plan  Cont on current regimen  Please contact office for sooner follow up if symptoms do not improve or worsen or seek emergency care

## 2016-01-04 ENCOUNTER — Ambulatory Visit (INDEPENDENT_AMBULATORY_CARE_PROVIDER_SITE_OTHER): Payer: BLUE CROSS/BLUE SHIELD | Admitting: Acute Care

## 2016-01-04 ENCOUNTER — Telehealth: Payer: Self-pay | Admitting: Adult Health

## 2016-01-04 ENCOUNTER — Other Ambulatory Visit (INDEPENDENT_AMBULATORY_CARE_PROVIDER_SITE_OTHER): Payer: BLUE CROSS/BLUE SHIELD

## 2016-01-04 ENCOUNTER — Telehealth: Payer: Self-pay | Admitting: Acute Care

## 2016-01-04 ENCOUNTER — Ambulatory Visit (INDEPENDENT_AMBULATORY_CARE_PROVIDER_SITE_OTHER)
Admission: RE | Admit: 2016-01-04 | Discharge: 2016-01-04 | Disposition: A | Discharge status: Home / Self Care | Payer: BLUE CROSS/BLUE SHIELD | Source: Ambulatory Visit | Attending: Acute Care | Admitting: Acute Care

## 2016-01-04 ENCOUNTER — Encounter: Payer: Self-pay | Admitting: Acute Care

## 2016-01-04 VITALS — BP 138/82 | HR 68 | Temp 98.0°F | Ht 69.0 in | Wt 211.6 lb

## 2016-01-04 DIAGNOSIS — R0789 Other chest pain: Secondary | ICD-10-CM | POA: Diagnosis not present

## 2016-01-04 DIAGNOSIS — M7989 Other specified soft tissue disorders: Secondary | ICD-10-CM | POA: Diagnosis not present

## 2016-01-04 LAB — BASIC METABOLIC PANEL
BUN: 20 mg/dL (ref 6–23)
CHLORIDE: 102 meq/L (ref 96–112)
CO2: 28 meq/L (ref 19–32)
CREATININE: 1.19 mg/dL (ref 0.40–1.50)
Calcium: 9.4 mg/dL (ref 8.4–10.5)
GFR: 65.41 mL/min (ref >60.00)
GLUCOSE: 110 mg/dL — AB (ref 70–99)
POTASSIUM: 3.7 meq/L (ref 3.5–5.1)
Sodium: 137 mEq/L (ref 135–145)

## 2016-01-04 LAB — BRAIN NATRIURETIC PEPTIDE: Pro B Natriuretic peptide (BNP): 55 pg/mL (ref 0.0–100.0)

## 2016-01-04 NOTE — Progress Notes (Signed)
Reviewed, agree with this plan of care 

## 2016-01-04 NOTE — Assessment & Plan Note (Addendum)
Continued leg swelling/ ? Mild fluid overload/? Diastolic Dysfunction Responsive to Lasix treatment Last Echo 2014 EF 60-65% 12/2015 Myocardial Perfusion Imaging: EF 55% C/O intermittent chest discomfort with left arm radiation that always self resolves. Has had this in the past/ has been worked up by cards. BNP 12/22/15: 54 Pro BNP 01/04/2016: 55 pg.mL Plan: EKG today : No acute change when compared to previous tracing CXR today/ No Acute Cardiopulmonary abnormalities ( personally reviewed by me) BNP today>> 55 pg/mL BMET today ( K+ 3.7)  Short term follow up with Cards for evaluation of cause of new persistent lower extremity edema.  Scheduled with [name] , [facility] 01[facility]05/2016 Repeat Echo per cards if felt to be clinically indicated. Please contact office for sooner follow up if symptoms do not improve or worsen or seek emergency care  Continue to avoid NSAIDS Heart Smart Diet with low salt. Weigh each am to monitor overload/weight gain If no cardiogenic cause of leg swelling discovered, CT chest to R/O other potential causes.

## 2016-01-04 NOTE — Telephone Encounter (Signed)
Spoke with the pt  He is c/o swelling in legs for the past 2 days He took 2 lasix last night and this am and this has helped minimally  He feels his breathing has been worse and also has cramping in his legs  OV with Sarah at 10:30 am today

## 2016-01-04 NOTE — Progress Notes (Signed)
Cardiology Office Note:    Date:  01/05/2016   ID:  Nicholas Camacho, DOB 15-Dec-1950, MRN [id number]  PCP:  Farris Has, MD  Cardiologist:  Dr. Tobias Alexander   Electrophysiologist:  n/a  Referring MD: Bevelyn Ngo, NP   Chief Complaint  Patient presents with  . Chest Pain    Ref by Saralyn Pilar, NP from Pulmonology    History of Present Illness:     Nicholas Camacho is a 65 y.o. male with a hx of HTN, HL, COPD, PAF, OSA, small AAA and iliac aneurysm. CHADS2-VASc=3 (HTN, prior TIA).  He is on Xarelto for anticoagulation. Last seen by Dr. Delton See 4/16. He noted significant dyspnea on exertion.  Nuclear stress test was felt to be low risk.   Recently seen in pulmonology in follow-up. He complained of lower extremity edema and was placed on Lasix. He was just seen again yesterday with continued LE edema. Dyspnea was also worse. He also admitted to chest discomfort. He is added on for further evaluation.  Here with his wife. He tells me his trouble started in 2/17 and he was treated for AECOPD.  He needed 2 rounds of prednisone.  He then developed LE edema and was put on Lasix with good results.  However, his edema returned after stopping the Lasix.  He has taken Lasix for the last 3 days.  He notes worsening dyspnea with worsening edema.  Wife has noticed that he is fatigued.  He denies orthopnea, PND. Denies syncope.  He has L chest pain that is a pressure and seems to be related to meals.  Notes belching.  Denies exertional chest pain.  Labs 4/16: Hgb 10.8. Labs 01/04/16 - K 3.7, Cr 1.19, BNP 55, CXR ok   Past Medical History  Diagnosis Date  . Elevated cholesterol   . Disturbance of skin sensation 06/24/2013  . Sleep disturbance, unspecified 06/24/2013    Past Surgical History  Procedure Laterality Date  . Fractured pelvis    . Reconstruct left heel  2002  . Tonsillectomy    . Inguinal hernia repair Bilateral     Current Medications: Outpatient Prescriptions Prior to  Visit  Medication Sig Dispense Refill  . albuterol (PROVENTIL) (2.5 MG/3ML) 0.083% nebulizer solution Take 3 mLs (2.5 mg total) by nebulization every 6 (six) hours as needed for wheezing or shortness of breath. 75 mL 12  . aspirin 81 MG tablet Take 81 mg by mouth daily.    [name] atorvastatin (LIPITOR) 10 MG tablet Take 10 mg by mouth daily.    . cetirizine (ZYRTEC) 10 MG tablet Take 10 mg by mouth daily.    . chlorpheniramine-HYDROcodone (TUSSIONEX PENNKINETIC ER) 10-8 MG/5ML SUER Take 5 mLs by mouth every 12 (twelve) hours as needed for cough. 140 mL 0  . rivaroxaban (XARELTO) 20 MG TABS tablet TAKE 1 TABLET BY MOUTH DAILY WITH SUPPER 90 tablet 0  . Tiotropium Bromide Monohydrate (SPIRIVA RESPIMAT) 2.5 MCG/ACT AERS Inhale 2 puffs into the lungs daily. 3 Inhaler 3  . mometasone-formoterol (DULERA) 200-5 MCG/ACT AERO Take 2 puffs first thing in am and then another 2 puffs about 12 hours later. 3 Inhaler 3  . PROAIR HFA 108 (90 BASE) MCG/ACT inhaler USE 2 INHALATIONS EVERY 6 HOURS AS NEEDED FOR WHEEZING OR SHORTNESS OF BREATH 8.5 g 6  . CARTIA XT 120 MG 24 hr capsule TAKE 1 CAPSULE DAILY (Patient not taking: Reported on 01/05/2016) 90 capsule 0  . furosemide (LASIX) 20 MG tablet 1-2  daily as needed (Patient not taking: Reported on 01/05/2016) 60 tablet 0  . varenicline (CHANTIX PAK) 0.5 MG X 11 & 1 MG X 42 tablet Take one 0.5 mg tablet by mouth once daily for 3 days, then increase to one 0.5 mg tablet twice daily for 4 days, then increase to one 1 mg tablet twice daily. (Patient not taking: Reported on 01/05/2016) 53 tablet 2   No facility-administered medications prior to visit.     Allergies:   Morphine and related   Social History   Social History  . Marital Status: Married    Spouse Name: N/A  . Number of Children: 2  . Years of Education: COLLEGE   Occupational History  . pest control    Social History Main Topics  . Smoking status: Former Smoker -- 1.50 packs/day for 45 years    Types:  Cigarettes    Quit date: 11/19/2015  . Smokeless tobacco: Never Used     Comment: pt quit smoking!! 11/19/15  . Alcohol Use: No  . Drug Use: No  . Sexual Activity: Not Asked   Other Topics Concern  . None   Social History Narrative     Family History:  The patient's family history includes Alcoholism in his sister; Diabetes in his mother; Drug abuse in his sister; Kidney failure in his father; Prostate cancer in his father.   ROS:   Please see the history of present illness.    ROS All other systems reviewed and are negative.   Physical Exam:    VS:  BP 128/60 mmHg  Pulse 78  Ht  (1.753 m)  Wt 210 lb 1.9 oz (95.31 kg)  BMI 31.02 kg/m2  SpO2 96%   GEN: Well nourished, well developed, in no acute distress HEENT: normal Neck: no JVD, no masses Cardiac: Normal S1/S2, RRR; no murmurs, rubs, or gallops, no edema   Respiratory:  Decreased breath sounds bilaterally; no wheezing, rhonchi or rales GI: soft, nontender, nondistended MS: no deformity or atrophy Skin: warm and dry Neuro: No focal deficits  Psych: Alert and oriented x 3, normal affect  Wt Readings from Last 3 Encounters:  01/05/16 210 lb 1.9 oz (95.31 kg)  01/04/16 211 lb 9.6 oz (95.981 kg)  12/22/15 217 lb (98.431 kg)      Studies/Labs Reviewed:     EKG:  EKG is  ordered today.  The ekg ordered today demonstrates NSR, HR 70, Normal axis, QTc 470 ms no changes  Recent Labs: 01/11/2015: ALT 14; Hemoglobin 10.8*; Platelets 356.0 01/04/2016: BUN 20; Creatinine, Ser 1.19; Potassium 3.7; Pro B Natriuretic peptide (BNP) 55.0; Sodium 137   Recent Lipid Panel    Component Value Date/Time   CHOL 111 01/11/2015 1248   TRIG 43.0 01/11/2015 1248   HDL 34.20* 01/11/2015 1248   CHOLHDL 3 01/11/2015 1248   VLDL 8.6 01/11/2015 1248   LDLCALC 68 01/11/2015 1248    Additional studies/ records that were reviewed today include:    Dg Chest 2 View  01/04/2016  CLINICAL DATA:  Chest tightness, intermittent shortness  of breath for 2-3 months, former smoking history EXAM: CHEST  2 VIEW COMPARISON:  Chest x-ray of 11/04/2015 FINDINGS: The lungs are clear but somewhat hyperaerated. Mediastinal and hilar contours are unremarkable. There is some peribronchial thickening present. The heart is within upper limits of normal. No bony abnormality is seen. IMPRESSION: 1. No change in hyper aeration and peribronchial thickening. 2. No active infiltrate or effusion. Electronically Signed  By: Dwyane Dee M.D.   On: 01/04/2016 11:44    AAA Duplex 4/16 Distal AAA 3 x 3 cm, L CIA aneurysm 2 x 1.9 cm FU 1 year  Myoview 4/16 Overall Impression: There is decreased activity in the inferior wall. Wall motion is good. It is possible that this represents diaphragmatic attenuation. However I cannot rule out the possibility of mild scar with very slight ischemia in the inferior wall. This is a low risk scan. LV Ejection Fraction: 55%. LV Wall Motion: There are no obvious significant wall motion abnormalities.   Holter 2/15 PAF  Echo 12/14 EF 60-65%, no RWMA, mild AI, septal lipomatous hypertrophy  Carotid US 10/14 No ICA stenosis   ASSESSMENT:     1. Shortness of breath   2. Chest pain, unspecified chest pain type   3. PAF (paroxysmal atrial fibrillation) (HCC)   4. Essential hypertension   5. Hyperlipidemia   6. Bilateral edema of lower extremity     PLAN:     In order of problems listed above:  1. Dyspnea - The patient presents with clinical picture concerning for CHF.  He also notes some atypical chest pain as well as significant fatigue.  His BNP was normal yesterday and his CXR was neg for edema.  Suspect he may have R sided HF.  His dyspnea is also likely to be multifactorial and related to COPD, CHF, obesity, deconditioning.  Last stress test was ~ 1 year ago.    -  Increase Lasix to 40 mg QD x 2 days, then Lasix 20 mg QD  -  CBC today  -  Obtain Echo  -  Obtain Lexiscan Myoview  -  BMET 1 week  -  Close FU  in 2 weeks.  2. Chest pain - As noted, CP is atypical. RFs include prior smoking, vascular dz, HTN, HL.  Will obtain nuclear stress test as noted.    3. PAF - Maintaining NSR.  Continue Xarelto.  CHADS2-VASc=3.  Obtain CBC.  Recent Creatinine ok.  4. HTN - BP controlled.  5. HL - Continue statin   6. Edema - Probably R sided HF.  Venous insuff may be playing a role. Will also get TSH, LFTs at FU (r/o hypoalbuminemia - if alb low, check UA).    Medication Adjustments/Labs and Tests Ordered: Current medicines are reviewed at length with the patient today.  Concerns regarding medicines are outlined above.  Medication changes, Labs and Tests ordered today are outlined in the Patient Instructions noted below. Patient Instructions  Medication Instructions:  1. INCREASE LASIX TO 40 MG DAILY FOR THE NEXT 2 DAYS; THEN START LASIX 20 MG DAILY; NEW RX SENT IN  Labwork: 1. TODAY CBC W/DIFF 2. BMET TO BE DONE IN 1 WEEK  Testing/Procedures: 1. Your physician has requested that you have an echocardiogram.TO BE DONE A FEW DAYS BEFORE THE MYOVIEW.  Echocardiography is a painless test that uses sound waves to create images of your heart. It provides your doctor with information about the size and shape of your heart and how well your heart's chambers and valves are working. This procedure takes approximately one hour. There are no restrictions for this procedure.  2. Your physician has requested that you have a lexiscan myoview. For further information please visit [email or web address] Please follow instruction sheet, as given.   Follow-Up: KEEP YOUR APPT WITH DR. Delton See 01/18/16  Any Other Special Instructions Will Be Listed Below (If Applicable).  If you need a refill on  your cardiac medications before your next appointment, please call your pharmacy.    Signed, Tereso NewcomerScott Brielyn Bosak, PA-C  01/05/2016 5:20 PM    [facility] [address], [place], [place]  [address] Phone: [phone]  [phone]; Fax: [phone]) [phone]

## 2016-01-04 NOTE — Progress Notes (Signed)
Subjective:    Patient ID: Nicholas Camacho, male    DOB: 07/22/1951, 65 y.o.   MRN: [id number]  HPI 65 year old male with COPD Gold II, lama/laba/ICS as maintenance.Recently quit smoking.Other significant history includes hypertension, hyperliperimia, OSA on CPAP. ( Split Sleep Study 08/2013)  Synopsis: Former patient of Dr. Shelle Ironlance with COPD GOLD II  PFT's 10/2013: FEV1 2.38 (72%), ratio 58, no BD response, TLC normal, DLCO 72%  Significant Tests/ Procedures:  BNP 12/22/15: 54 Last Echo: 08/2013: EF 60-65% 01/27/2016: Myocardial Perfusion Imaging: Overall Impression: There is decreased activity in the inferior wall. Wall motion is good. It is possible that this represents diaphragmatic attenuation. However I cannot rule out the possibility of mild scar with very slight ischemia in the inferior wall. This is a low risk scan.  LV Ejection Fraction: 55%. LV Wall Motion: There are no obvious significant wall motion abnormalities.   EXAM: 01/04/2016 CHEST 2 VIEW  COMPARISON: Chest x-ray of 11/04/2015  FINDINGS: The lungs are clear but somewhat hyperaerated. Mediastinal and hilar contours are unremarkable. There is some peribronchial thickening present. The heart is within upper limits of normal. No bony abnormality is seen.  IMPRESSION: 1. No change in hyper aeration and peribronchial thickening. 2. No active infiltrate or effusion.  Labs 01/04/2016:  Results for Nicholas NakayamaWESTBROOK, Oberon M (MRN [id number]) as of 01/04/2016 19:24  Ref. Range 01/04/2016 12:04  Sodium Latest Ref Range: 135-145 mEq/L 137  Potassium Latest Ref Range: 3.5-5.1 mEq/L 3.7  Chloride Latest Ref Range: 96-112 mEq/L 102  CO2 Latest Ref Range: 19-32 mEq/L 28  BUN Latest Ref Range: 6-23 mg/dL 20  Creatinine Latest Ref Range: 0.40-1.50 mg/dL 0.861.19  Calcium Latest Ref Range: 8.4-10.5 mg/dL 9.4  Glucose Latest Ref Range: 70-99 mg/dL 578110 (H)  GFR Latest Ref Range: >60.00 mL/min 65.41  Pro B Natriuretic peptide  (BNP) Latest Ref Range: 0.0-100.0 pg/mL 55.0    01/04/2016: Acute Office Visit:  Pt. was seen 12/22/15 for new onset lower extremity edema and was prescribed Lasix 20 mg two tabs for 2 days, then 1 tab daily for 1 week. This resolved the problem until this week. He states he took two  20 mg Lasix tabs yesterday ( 01/03/2016) evening and again this morning. Returns for an acute office visit with complaint of continued lower extremity edema with pitting edema of 2+ bilaterally. He is compliant with Lesia HausenXaralto for his atrial fibrillation.Worsening SOB since yesterday, no significant cough,no increase in mucus production, no orthopnea, hemoptysis, no fever, no n/v/d. CXR today with no acute cardio-pulmonary process.  When asked about chest pain he states he has had some on and off chest pressure with tingling that radiated down his left arm. He is currently pain free, but states that this has been an ongoing issue for awhile that always self resolves.His last cards appointment was 12/2014.He is scheduled for his annual check up this month. EKG done today shows no significant change from previous tracings.    Current outpatient prescriptions:  .  albuterol (PROVENTIL) (2.5 MG/3ML) 0.083% nebulizer solution, Take 3 mLs (2.5 mg total) by nebulization every 6 (six) hours as needed for wheezing or shortness of breath., Disp: 75 mL, Rfl: 12 .  aspirin 81 MG tablet, Take 81 mg by mouth daily., Disp: , Rfl:  .  atorvastatin (LIPITOR) 10 MG tablet, Take 10 mg by mouth daily., Disp: , Rfl:  .  CARTIA XT 120 MG 24 hr capsule, TAKE 1 CAPSULE DAILY, Disp: 90 capsule, Rfl: 0 .  cetirizine (ZYRTEC) 10 MG tablet, Take 10 mg by mouth daily., Disp: , Rfl:  .  chlorpheniramine-HYDROcodone (TUSSIONEX PENNKINETIC ER) 10-8 MG/5ML SUER, Take 5 mLs by mouth every 12 (twelve) hours as needed for cough., Disp: 140 mL, Rfl: 0 .  furosemide (LASIX) 20 MG tablet, 1-2 daily as needed, Disp: 60 tablet, Rfl: 0 .  mometasone-formoterol  (DULERA) 200-5 MCG/ACT AERO, Take 2 puffs first thing in am and then another 2 puffs about 12 hours later., Disp: 3 Inhaler, Rfl: 3 .  PROAIR HFA 108 (90 BASE) MCG/ACT inhaler, USE 2 INHALATIONS EVERY 6 HOURS AS NEEDED FOR WHEEZING OR SHORTNESS OF BREATH, Disp: 8.5 g, Rfl: 6 .  rivaroxaban (XARELTO) 20 MG TABS tablet, TAKE 1 TABLET BY MOUTH DAILY WITH SUPPER, Disp: 90 tablet, Rfl: 0 .  Tiotropium Bromide Monohydrate (SPIRIVA RESPIMAT) 2.5 MCG/ACT AERS, Inhale 2 puffs into the lungs daily., Disp: 3 Inhaler, Rfl: 3 .  varenicline (CHANTIX PAK) 0.5 MG X 11 & 1 MG X 42 tablet, Take one 0.5 mg tablet by mouth once daily for 3 days, then increase to one 0.5 mg tablet twice daily for 4 days, then increase to one 1 mg tablet twice daily., Disp: 53 tablet, Rfl: 2   Past Medical History  Diagnosis Date  . Elevated cholesterol   . Disturbance of skin sensation 06/24/2013  . Sleep disturbance, unspecified 06/24/2013    Allergies  Allergen Reactions  . Morphine And Related     HALLUCINATIONS    Review of Systems Constitutional:   No  weight loss, night sweats,  Fevers, chills, fatigue, or  lassitude.  HEENT:   No headaches,  Difficulty swallowing,  Tooth/dental problems, or  Sore throat,                No sneezing, itching, ear ache, nasal congestion, post nasal drip,   CV:  + chest intermittent  pain,  Orthopnea, PND, +swelling in lower extremities, anasarca, dizziness, palpitations, syncope.   GI  No heartburn, indigestion, abdominal pain, nausea, vomiting, diarrhea, change in bowel habits, loss of appetite, bloody stools.   Resp: + shortness of breath with exertion not  at rest.  No excess mucus, no productive cough,  No non-productive cough,  No coughing up of blood.  No change in color of mucus.  No wheezing.  No chest wall deformity  Skin: no rash or lesions.  GU: no dysuria, change in color of urine, no urgency or frequency.  No flank pain, no hematuria   MS:  No joint pain or swelling.   No decreased range of motion.  No back pain.  Psych:  No change in mood or affect. No depression or anxiety.  No memory loss.        Objective:   Physical Exam  BP 138/82 mmHg  Pulse 68  Temp(Src) 98 F (36.7 C) (Oral)  Ht  (1.753 m)  Wt 211 lb 9.6 oz (95.981 kg)  BMI 31.23 kg/m2  SpO2 96%  Physical Exam:  General- No distress,  A&Ox3, pleasant white male ENT: No sinus tenderness, TM clear, pale nasal mucosa, no oral exudate,no post nasal drip, no LAN, trace JVD, no bruits Cardiac: S1, S2, regular rate and rhythm, no murmur Chest: No wheeze/ rales/ dullness; no accessory muscle use, no nasal flaring, no sternal retractions Abd.: Soft Non-tender Ext: No clubbing cyanosis, 2-3 + pitting edema bilateral lower extremities, MAE x 4 Neuro:  normal strength Skin: No rashes, warm and dry Psych: normal mood and  behavior   Bevelyn Ngo, AGACNP-BC [facility] Pulmonary/Critical Care Medicine Pager # [phone] 01/04/2016    Assessment & Plan:

## 2016-01-04 NOTE — Telephone Encounter (Signed)
Attempted to call patient with his BMET results and BNP results. No answer. Left contact information and requested that the patient call the office in the morning for the results.

## 2016-01-04 NOTE — Patient Instructions (Addendum)
It is nice to meet you today. CXR today( WNL) EKG today( WNL) BNP, BMET We will call you with your lab results. Follow up with cardiology before the 19th of this month for new onset lower extremity edema and intermittent chest pain. Follow up Enis Slipperammy Parrot  01/16/2016 as is already scheduled. Please contact office for sooner follow up if symptoms do not improve or worsen or seek emergency care

## 2016-01-04 NOTE — Telephone Encounter (Signed)
LVM for pt to return call

## 2016-01-05 ENCOUNTER — Telehealth: Payer: Self-pay | Admitting: Internal Medicine

## 2016-01-05 ENCOUNTER — Ambulatory Visit (INDEPENDENT_AMBULATORY_CARE_PROVIDER_SITE_OTHER): Payer: BLUE CROSS/BLUE SHIELD | Admitting: Physician Assistant

## 2016-01-05 ENCOUNTER — Inpatient Hospital Stay (HOSPITAL_COMMUNITY)
Admission: EM | Admit: 2016-01-05 | Discharge: 2016-01-10 | DRG: 812 | Disposition: A | Discharge status: Home / Self Care | Payer: BLUE CROSS/BLUE SHIELD | Attending: Internal Medicine | Admitting: Internal Medicine

## 2016-01-05 ENCOUNTER — Telehealth: Payer: Self-pay | Admitting: Acute Care

## 2016-01-05 ENCOUNTER — Encounter: Payer: Self-pay | Admitting: Physician Assistant

## 2016-01-05 ENCOUNTER — Encounter (HOSPITAL_COMMUNITY): Payer: Self-pay | Admitting: *Deleted

## 2016-01-05 VITALS — BP 128/60 | HR 78 | Ht 69.0 in | Wt 210.1 lb

## 2016-01-05 DIAGNOSIS — R0602 Shortness of breath: Secondary | ICD-10-CM

## 2016-01-05 DIAGNOSIS — R079 Chest pain, unspecified: Secondary | ICD-10-CM

## 2016-01-05 DIAGNOSIS — G4733 Obstructive sleep apnea (adult) (pediatric): Secondary | ICD-10-CM | POA: Diagnosis present

## 2016-01-05 DIAGNOSIS — D509 Iron deficiency anemia, unspecified: Secondary | ICD-10-CM | POA: Diagnosis present

## 2016-01-05 DIAGNOSIS — I480 Paroxysmal atrial fibrillation: Secondary | ICD-10-CM

## 2016-01-05 DIAGNOSIS — Z885 Allergy status to narcotic agent status: Secondary | ICD-10-CM

## 2016-01-05 DIAGNOSIS — I10 Essential (primary) hypertension: Secondary | ICD-10-CM

## 2016-01-05 DIAGNOSIS — D500 Iron deficiency anemia secondary to blood loss (chronic): Secondary | ICD-10-CM | POA: Diagnosis not present

## 2016-01-05 DIAGNOSIS — Z79899 Other long term (current) drug therapy: Secondary | ICD-10-CM

## 2016-01-05 DIAGNOSIS — R600 Localized edema: Secondary | ICD-10-CM

## 2016-01-05 DIAGNOSIS — Z7982 Long term (current) use of aspirin: Secondary | ICD-10-CM

## 2016-01-05 DIAGNOSIS — Z7901 Long term (current) use of anticoagulants: Secondary | ICD-10-CM

## 2016-01-05 DIAGNOSIS — Z87891 Personal history of nicotine dependence: Secondary | ICD-10-CM

## 2016-01-05 DIAGNOSIS — B3781 Candidal esophagitis: Secondary | ICD-10-CM | POA: Diagnosis present

## 2016-01-05 DIAGNOSIS — Z7951 Long term (current) use of inhaled steroids: Secondary | ICD-10-CM

## 2016-01-05 DIAGNOSIS — D649 Anemia, unspecified: Secondary | ICD-10-CM | POA: Diagnosis not present

## 2016-01-05 DIAGNOSIS — E7800 Pure hypercholesterolemia, unspecified: Secondary | ICD-10-CM | POA: Diagnosis present

## 2016-01-05 DIAGNOSIS — I4891 Unspecified atrial fibrillation: Secondary | ICD-10-CM | POA: Diagnosis present

## 2016-01-05 DIAGNOSIS — E785 Hyperlipidemia, unspecified: Secondary | ICD-10-CM

## 2016-01-05 DIAGNOSIS — J449 Chronic obstructive pulmonary disease, unspecified: Secondary | ICD-10-CM | POA: Diagnosis present

## 2016-01-05 DIAGNOSIS — K861 Other chronic pancreatitis: Secondary | ICD-10-CM | POA: Diagnosis present

## 2016-01-05 DIAGNOSIS — I5032 Chronic diastolic (congestive) heart failure: Secondary | ICD-10-CM | POA: Diagnosis present

## 2016-01-05 LAB — CBC
HEMATOCRIT: 21.1 % — AB (ref 39.0–52.0)
HEMOGLOBIN: 5.2 g/dL — AB (ref 13.0–17.0)
MCH: 16.8 pg — ABNORMAL LOW (ref 26.0–34.0)
MCHC: 24.6 g/dL — ABNORMAL LOW (ref 30.0–36.0)
MCV: 68.3 fL — ABNORMAL LOW (ref 78.0–100.0)
Platelets: 477 10*3/uL — ABNORMAL HIGH (ref 150–400)
RBC: 3.09 MIL/uL — AB (ref 4.22–5.81)
RDW: 22.1 % — ABNORMAL HIGH (ref 11.5–15.5)
WBC: 9.5 10*3/uL (ref 4.0–10.5)

## 2016-01-05 LAB — CBC WITH DIFFERENTIAL/PLATELET
BASOS ABS: 0 {cells}/uL (ref 0–200)
Basophils Relative: 0 %
EOS ABS: 306 {cells}/uL (ref 15–500)
EOS PCT: 3 %
HCT: 20.2 % — ABNORMAL LOW (ref 38.5–50.0)
Hemoglobin: 5.5 g/dL — CL (ref 13.2–17.1)
LYMPHS PCT: 18 %
Lymphs Abs: 1836 cells/uL (ref 850–3900)
MCH: 17.2 pg — AB (ref 27.0–33.0)
MCHC: 26.7 g/dL — AB (ref 32.0–36.0)
MCV: 63.3 fL — AB (ref 80.0–100.0)
MONOS PCT: 8 %
MPV: 9.3 fL (ref 7.5–12.5)
Monocytes Absolute: 816 cells/uL (ref 200–950)
NEUTROS ABS: 7242 {cells}/uL (ref 1500–7800)
NEUTROS PCT: 71 %
PLATELETS: 515 10*3/uL — AB (ref 140–400)
RBC: 3.19 MIL/uL — ABNORMAL LOW (ref 4.20–5.80)
RDW: 21.4 % — AB (ref 11.0–15.0)
WBC: 10.2 10*3/uL (ref 3.8–10.8)

## 2016-01-05 MED ORDER — FUROSEMIDE 20 MG PO TABS: 20.0000 mg | ORAL_TABLET | Freq: Every day | ORAL | Status: DC

## 2016-01-05 NOTE — ED Notes (Signed)
Critical lab value called from lab  Hgb 5.2

## 2016-01-05 NOTE — Patient Instructions (Addendum)
Medication Instructions:  1. INCREASE LASIX TO 40 MG DAILY FOR THE NEXT 2 DAYS; THEN START LASIX 20 MG DAILY; NEW RX SENT IN  Labwork: 1. TODAY CBC W/DIFF 2. BMET, TSH, LFT TO BE DONE IN 1 WEEK  Testing/Procedures: 1. Your physician has requested that you have an echocardiogram.TO BE DONE A FEW DAYS BEFORE THE MYOVIEW.  Echocardiography is a painless test that uses sound waves to create images of your heart. It provides your doctor with information about the size and shape of your heart and how well your heart's chambers and valves are working. This procedure takes approximately one hour. There are no restrictions for this procedure.  2. Your physician has requested that you have a lexiscan myoview. For further information please visit [email or web address]. Please follow instruction sheet, as given.   Follow-Up: KEEP YOUR APPT WITH [name] 01/18/16  Any Other Special Instructions Will Be Listed Below (If Applicable).  If you need a refill on your cardiac medications before your next appointment, please call your pharmacy.

## 2016-01-05 NOTE — Addendum Note (Signed)
Addended by: Tarri FullerFIATO, CAROL M on: 01/05/2016 05:37 PM   Modules accepted: Orders

## 2016-01-05 NOTE — Telephone Encounter (Signed)
Pt states that he is returning Herbert DeanerSarah Groce's call regarding results. Was told to call back before seeing Cardiology today -- appt is at 3:20. Please advise Maralyn SagoSarah. Thanks.

## 2016-01-05 NOTE — Telephone Encounter (Signed)
Critical lab value called with hemoglobin 5.5, hct 20.5, plt 515, mcv 63. Microcytic anemia. I called [name] and let him know the value. He is having an echocardiogram tomorrow. He denies dark stools or bleeding. No dizziness. He has had swelling and weight gain not weight loss. Given hemodynamic stability and no active signs of bleeding, he will call his primary physician first thing in AM and obtain his echocardiogram as placed. We discussed warning signs and low threshold to come to hospital ER. He also already took his asa and rivaroxaban but he will hold his ASA/rivaroxaban until making a plan with his physician in AM.   Leeann MustJacob Caileen Veracruz, MD

## 2016-01-05 NOTE — ED Notes (Signed)
The pt was seen at his cardiologist easrlier he had ekg blood drawn.  His hgb was found to be 5.5  He was called to come to the hospital  To be treated.  He has not had any energy with sob for one month

## 2016-01-06 ENCOUNTER — Other Ambulatory Visit (HOSPITAL_COMMUNITY): Payer: BLUE CROSS/BLUE SHIELD

## 2016-01-06 ENCOUNTER — Encounter (HOSPITAL_COMMUNITY): Payer: Self-pay | Admitting: Gastroenterology

## 2016-01-06 ENCOUNTER — Inpatient Hospital Stay (HOSPITAL_COMMUNITY): Payer: BLUE CROSS/BLUE SHIELD

## 2016-01-06 DIAGNOSIS — Z79899 Other long term (current) drug therapy: Secondary | ICD-10-CM | POA: Diagnosis not present

## 2016-01-06 DIAGNOSIS — I5032 Chronic diastolic (congestive) heart failure: Secondary | ICD-10-CM | POA: Diagnosis present

## 2016-01-06 DIAGNOSIS — Z7901 Long term (current) use of anticoagulants: Secondary | ICD-10-CM | POA: Diagnosis not present

## 2016-01-06 DIAGNOSIS — Z7982 Long term (current) use of aspirin: Secondary | ICD-10-CM | POA: Diagnosis not present

## 2016-01-06 DIAGNOSIS — K861 Other chronic pancreatitis: Secondary | ICD-10-CM | POA: Diagnosis present

## 2016-01-06 DIAGNOSIS — Z87891 Personal history of nicotine dependence: Secondary | ICD-10-CM | POA: Diagnosis not present

## 2016-01-06 DIAGNOSIS — D649 Anemia, unspecified: Secondary | ICD-10-CM | POA: Diagnosis present

## 2016-01-06 DIAGNOSIS — D509 Iron deficiency anemia, unspecified: Secondary | ICD-10-CM | POA: Diagnosis not present

## 2016-01-06 DIAGNOSIS — I4891 Unspecified atrial fibrillation: Secondary | ICD-10-CM | POA: Diagnosis present

## 2016-01-06 DIAGNOSIS — J449 Chronic obstructive pulmonary disease, unspecified: Secondary | ICD-10-CM | POA: Diagnosis present

## 2016-01-06 DIAGNOSIS — D500 Iron deficiency anemia secondary to blood loss (chronic): Secondary | ICD-10-CM | POA: Diagnosis present

## 2016-01-06 DIAGNOSIS — Z885 Allergy status to narcotic agent status: Secondary | ICD-10-CM | POA: Diagnosis not present

## 2016-01-06 DIAGNOSIS — Z7951 Long term (current) use of inhaled steroids: Secondary | ICD-10-CM | POA: Diagnosis not present

## 2016-01-06 DIAGNOSIS — G4733 Obstructive sleep apnea (adult) (pediatric): Secondary | ICD-10-CM | POA: Diagnosis present

## 2016-01-06 DIAGNOSIS — E7800 Pure hypercholesterolemia, unspecified: Secondary | ICD-10-CM | POA: Diagnosis present

## 2016-01-06 DIAGNOSIS — R0600 Dyspnea, unspecified: Secondary | ICD-10-CM | POA: Diagnosis not present

## 2016-01-06 DIAGNOSIS — B3781 Candidal esophagitis: Secondary | ICD-10-CM | POA: Diagnosis present

## 2016-01-06 LAB — RETICULOCYTES
RBC.: 2.91 MIL/uL — ABNORMAL LOW (ref 4.22–5.81)
RETIC COUNT ABSOLUTE: 133.9 10*3/uL (ref 19.0–186.0)
Retic Ct Pct: 4.6 % — ABNORMAL HIGH (ref 0.4–3.1)

## 2016-01-06 LAB — IRON AND TIBC
IRON: 11 ug/dL — AB (ref 45–182)
Saturation Ratios: 2 % — ABNORMAL LOW (ref 17.9–39.5)
TIBC: 524 ug/dL — ABNORMAL HIGH (ref 250–450)
UIBC: 513 ug/dL

## 2016-01-06 LAB — COMPREHENSIVE METABOLIC PANEL
ALK PHOS: 86 U/L (ref 38–126)
ALT: 13 U/L — AB (ref 17–63)
ANION GAP: 7 (ref 5–15)
AST: 18 U/L (ref 15–41)
Albumin: 3.4 g/dL — ABNORMAL LOW (ref 3.5–5.0)
BUN: 20 mg/dL (ref 6–20)
CALCIUM: 8.9 mg/dL (ref 8.9–10.3)
CO2: 24 mmol/L (ref 22–32)
CREATININE: 1.36 mg/dL — AB (ref 0.61–1.24)
Chloride: 103 mmol/L (ref 101–111)
GFR, EST NON AFRICAN AMERICAN: 53 mL/min — AB (ref >60)
Glucose, Bld: 109 mg/dL — ABNORMAL HIGH (ref 65–99)
Potassium: 3.9 mmol/L (ref 3.5–5.1)
SODIUM: 134 mmol/L — AB (ref 135–145)
Total Bilirubin: 0.5 mg/dL (ref 0.3–1.2)
Total Protein: 6.8 g/dL (ref 6.5–8.1)

## 2016-01-06 LAB — FOLATE: FOLATE: 9.2 ng/mL (ref >5.9)

## 2016-01-06 LAB — HEMOGLOBIN AND HEMATOCRIT, BLOOD
HEMATOCRIT: 23.9 % — AB (ref 39.0–52.0)
HEMATOCRIT: 27.5 % — AB (ref 39.0–52.0)
Hemoglobin: 6.7 g/dL — CL (ref 13.0–17.0)
Hemoglobin: 7.7 g/dL — ABNORMAL LOW (ref 13.0–17.0)

## 2016-01-06 LAB — PREPARE RBC (CROSSMATCH)

## 2016-01-06 LAB — APTT: APTT: 31 s (ref 24–37)

## 2016-01-06 LAB — PROTIME-INR
INR: 1.68 — AB (ref 0.00–1.49)
PROTHROMBIN TIME: 19.8 s — AB (ref 11.6–15.2)

## 2016-01-06 LAB — VITAMIN B12: VITAMIN B 12: 358 pg/mL (ref 180–914)

## 2016-01-06 LAB — FERRITIN: Ferritin: 2 ng/mL — ABNORMAL LOW (ref 24–336)

## 2016-01-06 LAB — POC OCCULT BLOOD, ED: Fecal Occult Bld: NEGATIVE

## 2016-01-06 LAB — ABO/RH: ABO/RH(D): O POS

## 2016-01-06 MED ORDER — IOPAMIDOL (ISOVUE-300) INJECTION 61%
INTRAVENOUS | Status: AC
  Administered 2016-01-06: 100 mL
  Filled 2016-01-06: qty 100

## 2016-01-06 MED ORDER — ATORVASTATIN CALCIUM 10 MG PO TABS
10.0000 mg | ORAL_TABLET | Freq: Every day | ORAL | Status: DC
  Administered 2016-01-06 – 2016-01-10 (×5): 10 mg via ORAL
  Filled 2016-01-06 (×5): qty 1

## 2016-01-06 MED ORDER — SODIUM CHLORIDE 0.9 % IV SOLN
Freq: Once | INTRAVENOUS | Status: AC
  Administered 2016-01-06: 13:00:00 via INTRAVENOUS

## 2016-01-06 MED ORDER — DILTIAZEM HCL ER COATED BEADS 120 MG PO CP24
120.0000 mg | ORAL_CAPSULE | Freq: Every day | ORAL | Status: DC
  Administered 2016-01-06 – 2016-01-10 (×5): 120 mg via ORAL
  Filled 2016-01-06 (×5): qty 1

## 2016-01-06 MED ORDER — ALBUTEROL SULFATE (2.5 MG/3ML) 0.083% IN NEBU: 2.5000 mg | INHALATION_SOLUTION | Freq: Four times a day (QID) | RESPIRATORY_TRACT | Status: DC | PRN

## 2016-01-06 MED ORDER — FUROSEMIDE 20 MG PO TABS
20.0000 mg | ORAL_TABLET | Freq: Every day | ORAL | Status: DC
  Administered 2016-01-06 – 2016-01-10 (×5): 20 mg via ORAL
  Filled 2016-01-06 (×5): qty 1

## 2016-01-06 MED ORDER — SODIUM CHLORIDE 0.9 % IV SOLN: Freq: Once | INTRAVENOUS | Status: DC

## 2016-01-06 MED ORDER — FUROSEMIDE 10 MG/ML IJ SOLN
20.0000 mg | Freq: Once | INTRAMUSCULAR | Status: AC
  Administered 2016-01-06: 20 mg via INTRAVENOUS
  Filled 2016-01-06: qty 2

## 2016-01-06 MED ORDER — TIOTROPIUM BROMIDE MONOHYDRATE 18 MCG IN CAPS
18.0000 ug | ORAL_CAPSULE | Freq: Every day | RESPIRATORY_TRACT | Status: DC
  Administered 2016-01-06: 18 ug via RESPIRATORY_TRACT
  Filled 2016-01-06: qty 5

## 2016-01-06 MED ORDER — MOMETASONE FURO-FORMOTEROL FUM 200-5 MCG/ACT IN AERO
2.0000 | INHALATION_SPRAY | Freq: Two times a day (BID) | RESPIRATORY_TRACT | Status: DC
  Administered 2016-01-06 – 2016-01-10 (×7): 2 via RESPIRATORY_TRACT
  Filled 2016-01-06: qty 8.8

## 2016-01-06 MED ORDER — ALBUTEROL SULFATE HFA 108 (90 BASE) MCG/ACT IN AERS: 2.0000 | INHALATION_SPRAY | Freq: Four times a day (QID) | RESPIRATORY_TRACT | Status: DC | PRN

## 2016-01-06 MED ORDER — IOHEXOL 300 MG/ML  SOLN
25.0000 mL | INTRAMUSCULAR | Status: AC
  Administered 2016-01-06 (×2): 25 mL via ORAL

## 2016-01-06 MED ORDER — FUROSEMIDE 20 MG PO TABS: 20.0000 mg | ORAL_TABLET | Freq: Every day | ORAL | Status: DC

## 2016-01-06 MED ORDER — ASPIRIN 81 MG PO TABS: 81.0000 mg | ORAL_TABLET | Freq: Every day | ORAL | Status: DC

## 2016-01-06 MED ORDER — LORATADINE 10 MG PO TABS
10.0000 mg | ORAL_TABLET | Freq: Every day | ORAL | Status: DC
  Administered 2016-01-06 – 2016-01-10 (×5): 10 mg via ORAL
  Filled 2016-01-06 (×5): qty 1

## 2016-01-06 NOTE — ED Notes (Signed)
Consent obtained and sent with patient on transfer to unit.

## 2016-01-06 NOTE — ED Notes (Signed)
Called blood bank to inquire if blood is ready, still being worked on. Preparing to transfer patient to the floor. Dr. Julian ReilGardner discussed plan of care. No protonix drip at this time. 1st unit of blood may be given over 3-4 hours, and receiving floor may request for lasix if needed between units.

## 2016-01-06 NOTE — Progress Notes (Signed)
Utilization review completed.  

## 2016-01-06 NOTE — Telephone Encounter (Signed)
Sarah please advise if this has been handled.  Thanks.  

## 2016-01-06 NOTE — Progress Notes (Signed)
Pt seen and examined, admitted earlier this morning by Dr.Gardner 65/M smoker with COPD, P.AFib on xarelto OSA, admitted with symptomatic anemia (Hb 5.5), anemia panel with severe iron deficiency, hemoccult negative in ER. Completing second unit of blood, IV lasix now [facility] Underwent virtual colonoscopy 2years ago due to redundant colon, suboptimal study Stopped Xarelto  Zannie CovePreetha Daleyssa Loiselle, MD [phone]

## 2016-01-06 NOTE — ED Notes (Signed)
Dr. Mora Bellmanni made aware of critical Hgb.

## 2016-01-06 NOTE — ED Notes (Signed)
Attempted to call report, nurse receiving patient stated she would call back shortly.

## 2016-01-06 NOTE — Telephone Encounter (Signed)
Patient sent to ED Hgb 5.5. Tereso NewcomerScott Paulla Mcclaskey, PA-C   01/06/2016 2:56 PM

## 2016-01-06 NOTE — ED Notes (Signed)
Dr. Gardner, hospitalist, at the bedside.  

## 2016-01-06 NOTE — Progress Notes (Signed)
Patient lying in bed, no needs expressed at this time. Call light within reach 

## 2016-01-06 NOTE — ED Notes (Signed)
Attempted report 

## 2016-01-06 NOTE — Consult Note (Signed)
Reason for Consult: Microcytic anemia Referring Physician: Hospital team  Nicholas Camacho is an 65 y.o. male.  HPI: Patient seen and examined and his case discussed with the patient and his wife and our office computer chart and the hospital computer chart was reviewed and he is asymptomatic from a GI standpoint and supposedly had a incomplete colonoscopy years ago but we cannot find that report and they do not know who did it and a virtual colonoscopy in 2 years ago was reviewed and he has not had any other GI workup and specifically denies any upper tract symptoms swallowing problems or bowel movement problems has not seen any blood in his bowels nor any black stools however he has been on Cymbalta a baby aspirin and taking increased nonsteroidals frequently and hemoglobin last year was 10.8 with an MCV is 75 but was not brought to anyone's attention and his last hemoglobin in our office chart in 2015 was normal and was actually guaiac negative in the ER and his family history is negative for any GI problems and he has no other complaints  Past Medical History  Diagnosis Date  . Elevated cholesterol   . Disturbance of skin sensation 06/24/2013  . Sleep disturbance, unspecified 06/24/2013    Past Surgical History  Procedure Laterality Date  . Fractured pelvis    . Reconstruct left heel  2002  . Tonsillectomy    . Inguinal hernia repair Bilateral     Family History  Problem Relation Age of Onset  . Diabetes Mother   . Prostate cancer Father   . Kidney failure Father   . Alcoholism Sister     history of  . Drug abuse Sister     history of    Social History:  reports that he quit smoking about 6 weeks ago. His smoking use included Cigarettes. He has a 67.5 pack-year smoking history. He has never used smokeless tobacco. He reports that he does not drink alcohol or use illicit drugs.  Allergies:  Allergies  Allergen Reactions  . Morphine And Related     HALLUCINATIONS     Medications: I have reviewed the patient's current medications.  Results for orders placed or performed during the hospital encounter of 01/05/16 (from the past 48 hour(s))  Type and screen [place]     Status: None (Preliminary result)   Collection Time: 01/05/16 11:30 PM  Result Value Ref Range   ABO/RH(D) O POS    Antibody Screen NEG    Sample Expiration 01/08/2016    Unit Number [id number]    Blood Component Type RBC LR PHER2    Unit division 00    Status of Unit ISSUED    Transfusion Status OK TO TRANSFUSE    Crossmatch Result Compatible    Unit Number [id number]    Blood Component Type RBC LR PHER2    Unit division 00    Status of Unit ISSUED    Transfusion Status OK TO TRANSFUSE    Crossmatch Result Compatible    Unit Number [id number]    Blood Component Type RED CELLS,LR    Unit division 00    Status of Unit ISSUED    Transfusion Status OK TO TRANSFUSE    Crossmatch Result Compatible   ABO/Rh     Status: None   Collection Time: 01/05/16 11:30 PM  Result Value Ref Range   ABO/RH(D) O POS   Comprehensive metabolic panel     Status: Abnormal   Collection Time:  01/05/16 11:40 PM  Result Value Ref Range   Sodium 134 (L) 135 - 145 mmol/L   Potassium 3.9 3.5 - 5.1 mmol/L   Chloride 103 101 - 111 mmol/L   CO2 24 22 - 32 mmol/L   Glucose, Bld 109 (H) 65 - 99 mg/dL   BUN 20 6 - 20 mg/dL   Creatinine, Ser 1.36 (H) 0.61 - 1.24 mg/dL   Calcium 8.9 8.9 - 10.3 mg/dL   Total Protein 6.8 6.5 - 8.1 g/dL   Albumin 3.4 (L) 3.5 - 5.0 g/dL   AST 18 15 - 41 U/L   ALT 13 (L) 17 - 63 U/L   Alkaline Phosphatase 86 38 - 126 U/L   Total Bilirubin 0.5 0.3 - 1.2 mg/dL   GFR calc non Af Amer 53 (L) >60 mL/min   GFR calc Af Amer >60 >60 mL/min    Comment: (NOTE) The eGFR has been calculated using the CKD EPI equation. This calculation has not been validated in all clinical situations. eGFR's persistently <60 mL/min signify possible Chronic  Kidney Disease.    Anion gap 7 5 - 15  CBC     Status: Abnormal   Collection Time: 01/05/16 11:40 PM  Result Value Ref Range   WBC 9.5 4.0 - 10.5 K/uL   RBC 3.09 (L) 4.22 - 5.81 MIL/uL   Hemoglobin 5.2 (LL) 13.0 - 17.0 g/dL    Comment: REPEATED TO VERIFY CRITICAL RESULT CALLED TO, READ BACK BY AND VERIFIED WITH: K FIELDS,RN 2357 01/05/16 D BRADLEY    HCT 21.1 (L) 39.0 - 52.0 %   MCV 68.3 (L) 78.0 - 100.0 fL   MCH 16.8 (L) 26.0 - 34.0 pg   MCHC 24.6 (L) 30.0 - 36.0 g/dL   RDW 22.1 (H) 11.5 - 15.5 %   Platelets 477 (H) 150 - 400 K/uL  Protime-INR - (order if Patient is taking Coumadin / Warfarin)     Status: Abnormal   Collection Time: 01/05/16 11:40 PM  Result Value Ref Range   Prothrombin Time 19.8 (H) 11.6 - 15.2 seconds   INR 1.68 (H) 0.00 - 1.49  APTT     Status: None   Collection Time: 01/06/16 12:03 AM  Result Value Ref Range   aPTT 31 24 - 37 seconds  POC occult blood, ED     Status: None   Collection Time: 01/06/16 12:43 AM  Result Value Ref Range   Fecal Occult Bld NEGATIVE NEGATIVE  Prepare RBC     Status: None   Collection Time: 01/06/16  1:32 AM  Result Value Ref Range   Order Confirmation ORDER PROCESSED BY BLOOD BANK   Vitamin B12     Status: None   Collection Time: 01/06/16  1:36 AM  Result Value Ref Range   Vitamin B-12 358 180 - 914 pg/mL    Comment: (NOTE) This assay is not validated for testing neonatal or myeloproliferative syndrome specimens for Vitamin B12 levels.   Iron and TIBC     Status: Abnormal   Collection Time: 01/06/16  1:36 AM  Result Value Ref Range   Iron 11 (L) 45 - 182 ug/dL   TIBC 524 (H) 250 - 450 ug/dL   Saturation Ratios 2 (L) 17.9 - 39.5 %   UIBC 513 ug/dL  Ferritin     Status: Abnormal   Collection Time: 01/06/16  1:36 AM  Result Value Ref Range   Ferritin 2 (L) 24 - 336 ng/mL  Reticulocytes  Status: Abnormal   Collection Time: 01/06/16  1:36 AM  Result Value Ref Range   Retic Ct Pct 4.6 (H) 0.4 - 3.1 %   RBC. 2.91  (L) 4.22 - 5.81 MIL/uL   Retic Count, Manual 133.9 19.0 - 186.0 K/uL  Folate     Status: None   Collection Time: 01/06/16  5:41 AM  Result Value Ref Range   Folate 9.2 >5.9 ng/mL  Hemoglobin and hematocrit, blood     Status: Abnormal   Collection Time: 01/06/16 10:52 AM  Result Value Ref Range   Hemoglobin 6.7 (LL) 13.0 - 17.0 g/dL    Comment: REPEATED TO VERIFY POST TRANSFUSION SPECIMEN CRITICAL VALUE NOTED.  VALUE IS CONSISTENT WITH PREVIOUSLY REPORTED AND CALLED VALUE.    HCT 23.9 (L) 39.0 - 52.0 %  Prepare RBC     Status: None   Collection Time: 01/06/16 11:44 AM  Result Value Ref Range   Order Confirmation ORDER PROCESSED BY BLOOD BANK     No results found.  ROS negative except above Blood pressure 131/70, pulse 74, temperature 97.9 F (36.6 C), temperature source Oral, resp. rate 18, height _0  (1.753 m), weight 95.7 kg (210 lb 15.7 oz), SpO2 100 %. Physical Exam vital signs stable afebrile no acute distress decreased breath sounds bilaterally decreased heart sounds abdomen is soft nontender good bowel sounds minimal pedal edema labs reviewed  Assessment/Plan: Microcytic anemia in patient on xarelto baby aspirin and nonsteroidals Plan: We'll proceed with a CT first to rule out anything significant and if not an endoscopy and the risks benefits methods of that was discussed and if that is nondiagnostic probably an outpatient colonoscopy would be fine to complete his GI workup and then we will need cardiology to determine the bare minimum of blood thinners he needs and also he was instructed not to take nonsteroidals with blood thinners and will follow with you  Nicholas Camacho 01/06/2016, 2:18 PM

## 2016-01-06 NOTE — Progress Notes (Signed)
Call placed to on call hospitalist.  Notified of last H&H.  Return call received.  Cont to monitor overnight.  No new orders received.

## 2016-01-06 NOTE — ED Notes (Signed)
Phlebotomy at the bedside  

## 2016-01-06 NOTE — H&P (Signed)
[facility] History and Physical  [name]:[id number] DOB: 02/25/1951 DOA: 01/05/2016  Referring physician: EDP PCP: Farris Has, MD   Chief Complaint: Anemia   HPI: Nicholas Camacho is a 65 y.o. 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  Patient presents to the ED from his cardiologists office where he was seen today for complaint of several month history of progressive generalized weakness, fatigue, SOB, 13 lb weight gain, swelling.  They felt that it was likely he had new onset R sided heart failure.  Work up however revealed that the likely cause of this heart failure was a new and severe anemia with HGB of 5.5.  In the ED this was confirmed to be HGB 5.2.  Patient has no stigmata of GIB.  Hemoccult in ED is negative.  Review of Systems: Systems reviewed.  As above, otherwise negative  Past Medical History  Diagnosis Date  . Elevated cholesterol   . Disturbance of skin sensation 06/24/2013  . Sleep disturbance, unspecified 06/24/2013   Past Surgical History  Procedure Laterality Date  . Fractured pelvis    . Reconstruct left heel  2002  . Tonsillectomy    . Inguinal hernia repair Bilateral    Social History:  reports that he quit smoking about 6 weeks ago. His smoking use included Cigarettes. He has a 67.5 pack-year smoking history. He has never used smokeless tobacco. He reports that he does not drink alcohol or use illicit drugs.  Allergies  Allergen Reactions  . Morphine And Related     HALLUCINATIONS    Family History  Problem Relation Age of Onset  . Diabetes Mother   . Prostate cancer Father   . Kidney failure Father   . Alcoholism Sister     history of  . Drug abuse Sister     history of     Prior to Admission medications   Medication Sig Start Date End Date Taking? Authorizing Provider  albuterol (PROAIR HFA) 108 (90 Base) MCG/ACT inhaler Inhale 2 puffs into the lungs every 6 (six) hours as needed for  wheezing or shortness of breath.   Yes Historical Provider, MD  albuterol (PROVENTIL) (2.5 MG/3ML) 0.083% nebulizer solution Take 3 mLs (2.5 mg total) by nebulization every 6 (six) hours as needed for wheezing or shortness of breath. 12/09/15  Yes Lupita Leash, MD  aspirin 81 MG tablet Take 81 mg by mouth daily.   Yes Historical Provider, MD  atorvastatin (LIPITOR) 10 MG tablet Take 10 mg by mouth daily. 06/12/13  Yes Historical Provider, MD  cetirizine (ZYRTEC) 10 MG tablet Take 10 mg by mouth daily.   Yes Historical Provider, MD  diltiazem (CARTIA XT) 120 MG 24 hr capsule Take 120 mg by mouth daily.   Yes Historical Provider, MD  furosemide (LASIX) 20 MG tablet Take 1 tablet (20 mg total) by mouth daily. 01/05/16  Yes Scott T Weaver, PA-C  mometasone-formoterol (DULERA) 200-5 MCG/ACT AERO Inhale 2 puffs into the lungs 2 (two) times daily. Take 2 puffs in the lungs first thing in am and then another 2 puffs in lungs about 12 hours later.   Yes Historical Provider, MD  rivaroxaban (XARELTO) 20 MG TABS tablet TAKE 1 TABLET BY MOUTH DAILY WITH SUPPER 11/15/15  Yes Lars Masson, MD  Tiotropium Bromide Monohydrate (SPIRIVA RESPIMAT) 2.5 MCG/ACT AERS Inhale 2 puffs into the lungs daily. 06/10/15  Yes Lupita Leash, MD   Physical Exam: Filed Vitals:  01/06/16 0115 01/06/16 0130  BP: 117/64 120/77  Pulse: 68 69  Temp:    Resp: 15 13    BP 120/77 mmHg  Pulse 69  Temp(Src) 98.2 F (36.8 C) (Oral)  Resp 13  Ht  (1.753 m)  Wt 95.709 kg (211 lb)  BMI 31.15 kg/m2  SpO2 100%  General Appearance:    Alert, oriented, no distress, appears stated age  Head:    Normocephalic, atraumatic  Eyes:    PERRL, EOMI, sclera non-icteric        Nose:   Nares without drainage or epistaxis. Mucosa, turbinates normal  Throat:   Moist mucous membranes. Oropharynx without erythema or exudate.  Neck:   Supple. No carotid bruits.  No thyromegaly.  No lymphadenopathy.   Back:     No CVA tenderness, no  spinal tenderness  Lungs:     Clear to auscultation bilaterally, without wheezes, rhonchi or rales  Chest wall:    No tenderness to palpitation  Heart:    Regular rate and rhythm without murmurs, gallops, rubs  Abdomen:     Soft, non-tender, nondistended, normal bowel sounds, no organomegaly  Genitalia:    deferred  Rectal:    deferred  Extremities:   No clubbing, cyanosis or edema.  Pulses:   2+ and symmetric all extremities  Skin:   Skin color, texture, turgor normal, no rashes or lesions  Lymph nodes:   Cervical, supraclavicular, and axillary nodes normal  Neurologic:   CNII-XII intact. Normal strength, sensation and reflexes      throughout    Labs on Admission:  Basic Metabolic Panel:  Recent Labs Lab 01/04/16 1204 01/05/16 2340  NA 137 134*  K 3.7 3.9  CL 102 103  CO2 28 24  GLUCOSE 110* 109*  BUN 20 20  CREATININE 1.19 1.36*  CALCIUM 9.4 8.9   Liver Function Tests:  Recent Labs Lab 01/05/16 2340  AST 18  ALT 13*  ALKPHOS 86  BILITOT 0.5  PROT 6.8  ALBUMIN 3.4*   No results for input(s): LIPASE, AMYLASE in the last 168 hours. No results for input(s): AMMONIA in the last 168 hours. CBC:  Recent Labs Lab 01/05/16 1700 01/05/16 2340  WBC 10.2 9.5  NEUTROABS 7242  --   HGB 5.5* 5.2*  HCT 20.2* 21.1*  MCV 63.3* 68.3*  PLT 515* 477*   Cardiac Enzymes: No results for input(s): CKTOTAL, CKMB, CKMBINDEX, TROPONINI in the last 168 hours.  BNP (last 3 results)  Recent Labs  06/20/15 1441 01/04/16 1204  PROBNP 20.0 55.0   CBG: No results for input(s): GLUCAP in the last 168 hours.  Radiological Exams on Admission: Dg Chest 2 View  01/04/2016  CLINICAL DATA:  Chest tightness, intermittent shortness of breath for 2-3 months, former smoking history EXAM: CHEST  2 VIEW COMPARISON:  Chest x-ray of 11/04/2015 FINDINGS: The lungs are clear but somewhat hyperaerated. Mediastinal and hilar contours are unremarkable. There is some peribronchial thickening  present. The heart is within upper limits of normal. No bony abnormality is seen. IMPRESSION: 1. No change in hyper aeration and peribronchial thickening. 2. No active infiltrate or effusion. Electronically Signed   By: Dwyane Dee M.D.   On: 01/04/2016 11:44    EKG: Independently reviewed.  Assessment/Plan Principal Problem:   Symptomatic anemia Active Problems:   Atrial fibrillation (HCC)   Microcytic hypochromic anemia   1. Symptomatic anemia, microcytic hypochromic - 1. Occult blood negative 2. Anemia pnl ordered, suspect iron deficiency anemia  due to a chronic blood loss perhaps from chronic GIB 1. If this is the case, may require upper EGD, lower EGD as well but this latter will be difficult due to history of redudant colon which they wernt able to completely colonoscopy in the past with scope. 3. Transfusing 2 units PRBC slowly 4. May require lasix between units (RN to call) 5. H/H post transfusion 6. havent ordered 2d echo at this time, but may if he is still symptomatic post transfusions, at this point the likely cause of CHF is symptomatic anemia. 2. A.Fib 1. Stopping Xarelto and ASA that he takes for A.Fib history for now 2. Continue rate control    Code Status: Full  Family Communication: No family in room Disposition Plan: Admit to inpatient   Time spent: 70 min  GARDNER, JARED M. [facility] Pager [phone]  If 7AM-7PM, please contact the day team taking care of the patient [email or web address] Password [email or web address] 01/06/2016, 1:41 AM

## 2016-01-06 NOTE — ED Provider Notes (Signed)
CSN: [id number]     Arrival date & time 01/05/16  2320 History  By signing my name below, I, [facility], attest that this documentation has been prepared under the direction and in the presence of Tomasita Crumble, MD. Electronically Signed: Randell Patient, ED Scribe. 01/06/2016. 12:51 AM.   Chief Complaint  Patient presents with  . Abnormal Lab    The history is provided by the patient. No language interpreter was used.  HPI Comments: Nicholas Camacho is a 65 y.o. male who presents to the Emergency Department complaining of constant, unchanging SOB and fatigue ongoing for the past month. Wife reports that the patient received a phone call earlier tonight from his cardiologist who told him that his hemoglobin was 5 after appointment earlier today and advised him to come to the ED tonight. He endorses weight gain of 13 lbs and BLE swelling. Patient states that he had a URI in  2 months ago where he was prescribed a Z-pack and prednisone. He took this medications with relief of all his symptoms except the SOB and fatigue. He has taken Lasix and Xarelto. Denies blood in stool, hematuria, wheezing.  Past Medical History  Diagnosis Date  . Elevated cholesterol   . Disturbance of skin sensation 06/24/2013  . Sleep disturbance, unspecified 06/24/2013   Past Surgical History  Procedure Laterality Date  . Fractured pelvis    . Reconstruct left heel  2002  . Tonsillectomy    . Inguinal hernia repair Bilateral    Family History  Problem Relation Age of Onset  . Diabetes Mother   . Prostate cancer Father   . Kidney failure Father   . Alcoholism Sister     history of  . Drug abuse Sister     history of   Social History  Substance Use Topics  . Smoking status: Former Smoker -- 1.50 packs/day for 45 years    Types: Cigarettes    Quit date: 11/19/2015  . Smokeless tobacco: Never Used     Comment: pt quit smoking!! 11/19/15  . Alcohol Use: No    Review of Systems A complete 10  system review of systems was obtained and all systems are negative except as noted in the HPI and PMH.   Allergies  Morphine and related  Home Medications   Prior to Admission medications   Medication Sig Start Date End Date Taking? Authorizing Provider  albuterol (PROAIR HFA) 108 (90 Base) MCG/ACT inhaler Inhale 2 puffs into the lungs every 6 (six) hours as needed for wheezing or shortness of breath.    Historical Provider, MD  albuterol (PROVENTIL) (2.5 MG/3ML) 0.083% nebulizer solution Take 3 mLs (2.5 mg total) by nebulization every 6 (six) hours as needed for wheezing or shortness of breath. 12/09/15   Lupita Leash, MD  aspirin 81 MG tablet Take 81 mg by mouth daily.    Historical Provider, MD  atorvastatin (LIPITOR) 10 MG tablet Take 10 mg by mouth daily. 06/12/13   Historical Provider, MD  cetirizine (ZYRTEC) 10 MG tablet Take 10 mg by mouth daily.    Historical Provider, MD  chlorpheniramine-HYDROcodone (TUSSIONEX PENNKINETIC ER) 10-8 MG/5ML SUER Take 5 mLs by mouth every 12 (twelve) hours as needed for cough. 12/09/15   Lupita Leash, MD  diltiazem (CARTIA XT) 120 MG 24 hr capsule Take 120 mg by mouth daily.    Historical Provider, MD  furosemide (LASIX) 20 MG tablet Take 1 tablet (20 mg total) by mouth daily. 01/05/16  Scott T Weaver, PA-C  mometasone-formoterol (DULERA) 200-5 MCG/ACT AERO Inhale 2 puffs into the lungs 2 (two) times daily. Take 2 puffs in the lungs first thing in am and then another 2 puffs in lungs about 12 hours later.    Historical Provider, MD  rivaroxaban (XARELTO) 20 MG TABS tablet TAKE 1 TABLET BY MOUTH DAILY WITH SUPPER 11/15/15   Lars Masson, MD  Tiotropium Bromide Monohydrate (SPIRIVA RESPIMAT) 2.5 MCG/ACT AERS Inhale 2 puffs into the lungs daily. 06/10/15   Lupita Leash, MD   BP 122/73 mmHg  Pulse 71  Temp(Src) 98.2 F (36.8 C) (Oral)  Resp 15  Ht  (1.753 m)  Wt 211 lb (95.709 kg)  BMI 31.15 kg/m2  SpO2 98% Physical Exam   Constitutional: He is oriented to person, place, and time. Vital signs are normal. He appears well-developed and well-nourished.  Non-toxic appearance. He does not appear ill. No distress.  Nasal cannula in place.  HENT:  Head: Normocephalic and atraumatic.  Nose: Nose normal.  Mouth/Throat: Oropharynx is clear and moist. No oropharyngeal exudate.  Eyes: Conjunctivae and EOM are normal. Pupils are equal, round, and reactive to light. No scleral icterus.  Neck: Normal range of motion. Neck supple. No tracheal deviation, no edema, no erythema and normal range of motion present. No thyroid mass and no thyromegaly present.  Cardiovascular: Normal rate, regular rhythm, S1 normal, S2 normal, normal heart sounds, intact distal pulses and normal pulses.  Exam reveals no gallop and no friction rub.   No murmur heard. Pulmonary/Chest: Effort normal and breath sounds normal. No respiratory distress. He has no wheezes. He has no rhonchi. He has no rales.  Abdominal: Soft. Normal appearance and bowel sounds are normal. He exhibits no distension, no ascites and no mass. There is no hepatosplenomegaly. There is no tenderness. There is no rebound, no guarding and no CVA tenderness.  Musculoskeletal: Normal range of motion. He exhibits edema. He exhibits no tenderness.  BLE extremity edema.  Lymphadenopathy:    He has no cervical adenopathy.  Neurological: He is alert and oriented to person, place, and time. He has normal strength. No cranial nerve deficit or sensory deficit.  Skin: Skin is warm, dry and intact. No petechiae and no rash noted. He is not diaphoretic. No erythema. No pallor.  Psychiatric: He has a normal mood and affect. His behavior is normal. Judgment normal.  Nursing note and vitals reviewed.   ED Course  Procedures   DIAGNOSTIC STUDIES: Oxygen Saturation is 98% on RA, normal by my interpretation.    COORDINATION OF CARE: 12:34 AM Discussed results of labs. Will admit to hospital  Discussed treatment plan with pt at bedside and pt agreed to plan.   Labs Review Labs Reviewed  COMPREHENSIVE METABOLIC PANEL - Abnormal; Notable for the following:    Sodium 134 (*)    Glucose, Bld 109 (*)    Creatinine, Ser 1.36 (*)    Albumin 3.4 (*)    ALT 13 (*)    GFR calc non Af Amer 53 (*)    All other components within normal limits  CBC - Abnormal; Notable for the following:    RBC 3.09 (*)    Hemoglobin 5.2 (*)    HCT 21.1 (*)    MCV 68.3 (*)    MCH 16.8 (*)    MCHC 24.6 (*)    RDW 22.1 (*)    Platelets 477 (*)    All other components within normal limits  PROTIME-INR - Abnormal; Notable for the following:    Prothrombin Time 19.8 (*)    INR 1.68 (*)    All other components within normal limits  APTT  VITAMIN B12  FOLATE  IRON AND TIBC  FERRITIN  RETICULOCYTES  POC OCCULT BLOOD, ED  POC OCCULT BLOOD, ED  TYPE AND SCREEN  ABO/RH  PREPARE RBC (CROSSMATCH)  TYPE AND SCREEN    Imaging Review Dg Chest 2 View  01/04/2016  CLINICAL DATA:  Chest tightness, intermittent shortness of breath for 2-3 months, former smoking history EXAM: CHEST  2 VIEW COMPARISON:  Chest x-ray of 11/04/2015 FINDINGS: The lungs are clear but somewhat hyperaerated. Mediastinal and hilar contours are unremarkable. There is some peribronchial thickening present. The heart is within upper limits of normal. No bony abnormality is seen. IMPRESSION: 1. No change in hyper aeration and peribronchial thickening. 2. No active infiltrate or effusion. Electronically Signed   By: Dwyane DeePaul  Barry M.D.   On: 01/04/2016 11:44   I have personally reviewed and evaluated these images and lab results as part of my medical decision-making.   EKG Interpretation None      MDM   Final diagnoses:  None   Patient presents emergency department for weakness and shortness of breath for the past week. His hemoglobin was 5 his primary care office and he was sent to the emergency department. Repeat testing shows the  same. Patient be admitted for symptomatically anemia. Type and screen was sent and 2 units of blood have been ordered. I spoke with Dr. Lanae BoastGarner with [facility] who accepts the patient for further care. Hemoccult is negative. Patient is on Xarelto for atrial fibrillation. He does not know where his source of bleeding is coming from.   CRITICAL CARE Performed by: Tomasita CrumbleNI,Yarixa Lightcap   Total critical care time: 45 minutes - symptomatic anemia, 2u PRBC ordered  Critical care time was exclusive of separately billable procedures and treating other patients.  Critical care was necessary to treat or prevent imminent or life-threatening deterioration.  Critical care was time spent personally by me on the following activities: development of treatment plan with patient and/or surrogate as well as nursing, discussions with consultants, evaluation of patient's response to treatment, examination of patient, obtaining history from patient or surrogate, ordering and performing treatments and interventions, ordering and review of laboratory studies, ordering and review of radiographic studies, pulse oximetry and re-evaluation of patient's condition.     I personally performed the services described in this documentation, which was scribed in my presence. The recorded information has been reviewed and is accurate.     Tomasita CrumbleAdeleke Dagmawi Venable, MD 01/06/16 [phone]

## 2016-01-07 ENCOUNTER — Inpatient Hospital Stay (HOSPITAL_COMMUNITY): Payer: BLUE CROSS/BLUE SHIELD

## 2016-01-07 DIAGNOSIS — R0600 Dyspnea, unspecified: Secondary | ICD-10-CM

## 2016-01-07 DIAGNOSIS — I4891 Unspecified atrial fibrillation: Secondary | ICD-10-CM

## 2016-01-07 DIAGNOSIS — D509 Iron deficiency anemia, unspecified: Secondary | ICD-10-CM

## 2016-01-07 LAB — CBC
HCT: 26.2 % — ABNORMAL LOW (ref 39.0–52.0)
HEMATOCRIT: 30.3 % — AB (ref 39.0–52.0)
HEMOGLOBIN: 8.2 g/dL — AB (ref 13.0–17.0)
Hemoglobin: 7.1 g/dL — ABNORMAL LOW (ref 13.0–17.0)
MCH: 19.9 pg — AB (ref 26.0–34.0)
MCH: 20.4 pg — ABNORMAL LOW (ref 26.0–34.0)
MCHC: 27.1 g/dL — ABNORMAL LOW (ref 30.0–36.0)
MCHC: 27.1 g/dL — ABNORMAL LOW (ref 30.0–36.0)
MCV: 73.6 fL — ABNORMAL LOW (ref 78.0–100.0)
MCV: 75.4 fL — AB (ref 78.0–100.0)
PLATELETS: 422 10*3/uL — AB (ref 150–400)
Platelets: 415 10*3/uL — ABNORMAL HIGH (ref 150–400)
RBC: 3.56 MIL/uL — AB (ref 4.22–5.81)
RBC: 4.02 MIL/uL — AB (ref 4.22–5.81)
RDW: 23.7 % — ABNORMAL HIGH (ref 11.5–15.5)
RDW: 23.6 % — ABNORMAL HIGH (ref 11.5–15.5)
WBC: 8 10*3/uL (ref 4.0–10.5)
WBC: 8.3 10*3/uL (ref 4.0–10.5)

## 2016-01-07 LAB — BASIC METABOLIC PANEL
Anion gap: 9 (ref 5–15)
BUN: 17 mg/dL (ref 6–20)
CO2: 26 mmol/L (ref 22–32)
CREATININE: 1.13 mg/dL (ref 0.61–1.24)
Calcium: 8.8 mg/dL — ABNORMAL LOW (ref 8.9–10.3)
Chloride: 106 mmol/L (ref 101–111)
Glucose, Bld: 103 mg/dL — ABNORMAL HIGH (ref 65–99)
POTASSIUM: 4.3 mmol/L (ref 3.5–5.1)
SODIUM: 141 mmol/L (ref 135–145)

## 2016-01-07 LAB — ECHOCARDIOGRAM COMPLETE
HEIGHTINCHES: 69 in
WEIGHTICAEL: 3375.68 [oz_av]

## 2016-01-07 LAB — PREPARE RBC (CROSSMATCH)

## 2016-01-07 IMAGING — NM NM MYOCAR MULTI W/ SPECT
3 series · 18 of 18 positions shown · non-contrast
Comparison: none

[Series 1: stress_(id)_sa · 6.5mm · 6.51mm/px · 6 of 64 frames shown (1 of 2)]
[frame 6/64]
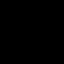
[frame 16/64]
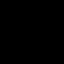
[frame 27/64]
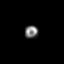
[frame 38/64]
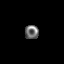
[frame 48/64]
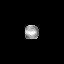
[frame 59/64]
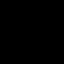

[Series 1: stress_(id)_sa · 6.5mm · 6.51mm/px · 6 of 512 frames shown (2 of 2)]
[frame 43/512]
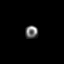
[frame 128/512]
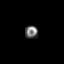
[frame 214/512]
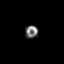
[frame 299/512]
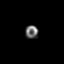
[frame 384/512]
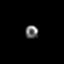
[frame 470/512]
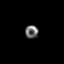

[Series 1: rest_(id)_sa · 6.5mm · 6.51mm/px · 6 of 64 frames shown]
[frame 6/64]
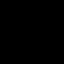
[frame 16/64]
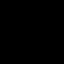
[frame 27/64]
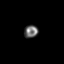
[frame 38/64]
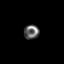
[frame 48/64]
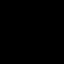
[frame 59/64]
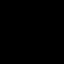

[18 of 18 positions shown; findings below may reference images not displayed]

Canned report from images found in remote index.

Refer to host system for actual result text.

## 2016-01-07 MED ORDER — SODIUM CHLORIDE 0.9 % IV SOLN
INTRAVENOUS | Status: DC
  Administered 2016-01-07: 16:00:00 via INTRAVENOUS
  Administered 2016-01-08: 500 mL via INTRAVENOUS

## 2016-01-07 MED ORDER — FUROSEMIDE 10 MG/ML IJ SOLN
20.0000 mg | Freq: Once | INTRAMUSCULAR | Status: AC
  Administered 2016-01-07: 20 mg via INTRAVENOUS
  Filled 2016-01-07: qty 2

## 2016-01-07 MED ORDER — SODIUM CHLORIDE 0.9 % IV SOLN
Freq: Once | INTRAVENOUS | Status: AC
  Administered 2016-01-07: 13:00:00 via INTRAVENOUS

## 2016-01-07 MED ORDER — PANTOPRAZOLE SODIUM 40 MG IV SOLR
40.0000 mg | Freq: Two times a day (BID) | INTRAVENOUS | Status: DC
  Administered 2016-01-07 – 2016-01-10 (×7): 40 mg via INTRAVENOUS
  Filled 2016-01-07 (×7): qty 40

## 2016-01-07 NOTE — Progress Notes (Signed)
  Echocardiogram 2D Echocardiogram has been performed.  Nicholas Camacho, Nicholas Camacho 01/07/2016, 2:25 PM

## 2016-01-07 NOTE — Progress Notes (Addendum)
Right IV infiltrated during first minute of blood transfusion. Transfusion was stopped and transferred to the left IV. IV was dc'd. Will continue to monitor the site.

## 2016-01-07 NOTE — Progress Notes (Signed)
[facility] PROGRESS NOTE Subjective patient feels better he has had transfusions. He is currently getting cardiac ECHO.  Objective: Vital signs in last 24 hours: Temp:  [97.5 F (36.4 C)-98.5 F (36.9 C)] 97.5 F (36.4 C) (04/08 1252) Pulse Rate:  [62-69] 68 (04/08 1252) Resp:  [18] 18 (04/08 1252) BP: (113-129)/(65-71) 127/71 mmHg (04/08 1252) SpO2:  [95 %-100 %] 97 % (04/08 1252) Last BM Date: 01/05/16  Intake/Output from previous day: 04/07 0701 - 04/08 0700 In: 1065 [P.O.:720; I.V.:10; Blood:335] Out: 350 [Urine:350] Intake/Output this shift: Total I/O In: 330 [Blood:330] Out: -    Lab Results:  Recent Labs  01/05/16 1700 01/05/16 2340 01/06/16 1052 01/06/16 1808 01/07/16 0428  WBC 10.2 9.5  --   --  8.0  HGB 5.5* 5.2* 6.7* 7.7* 7.1*  HCT 20.2* 21.1* 23.9* 27.5* 26.2*  PLT 515* 477*  --   --  422*   BMET  Recent Labs  01/05/16 2340 01/07/16 0428  NA 134* 141  K 3.9 4.3  CL 103 106  CO2 24 26  CREATININE 1.36* 1.13   LFT  Recent Labs  01/05/16 2340  PROT 6.8  AST 18  ALT 13*  ALKPHOS 86  BILITOT 0.5   PT/INR  Recent Labs  01/05/16 2340  LABPROT 19.8*  INR 1.68*   PANCREAS No results for input(s): LIPASE in the last 72 hours.       Studies/Results: Ct Abdomen Pelvis W Contrast  01/06/2016  CLINICAL DATA:  Anemia EXAM: CT ABDOMEN AND PELVIS WITH CONTRAST TECHNIQUE: Multidetector CT imaging of the abdomen and pelvis was performed using the standard protocol following bolus administration of intravenous contrast. CONTRAST:  100 mL ISOVUE-300 IOPAMIDOL (ISOVUE-300) INJECTION 61% COMPARISON:  11/11/2013 FINDINGS: Lung bases are free of acute infiltrate or sizable effusion. The liver, spleen and adrenal glands are within normal limits. The pancreas shows diffuse calcifications and atrophy in its tail consistent with chronic pancreatitis. The gallbladder is well distended with multiple dependent stones. The kidneys are well  visualized bilaterally and demonstrate a normal enhancement and excretion pattern. A few scattered small hypodensities are noted within the left kidney likely representing cysts. There are too small for adequate characterization however. Fecal material is noted throughout the colon. No focal mass lesion is seen. The appendix is within normal limits. Mild aortoiliac calcifications are seen without aneurysmal dilatation. The bladder is well distended. No pelvic mass lesion is seen. The bony structures show prior fusion of the pubic symphysis. Degenerative changes of the sacroiliac joints are seen as well. No acute bony abnormality is noted. IMPRESSION: Progressive chronic pancreatitis in the tail. Cholelithiasis stable from the previous exam. No acute abnormality noted. Electronically Signed   By: Alcide CleverMark  Lukens M.D.   On: 01/06/2016 19:56    Medications: I have reviewed the patient's current medications.  Assessment/Plan: 1. Profound iron deficiency anemia. Interestingly his stools were negative in the emergency room but iron saturation is very low and ferritin is almost nondetectable. He has had colonoscopy in the past that was incomplete due to marked tortuosity of thecolon in a virtual colonoscopy subsequently was done. He apparently did have a normal hemoglobin in 2015. We will start with EGD tomorrow morning. He may well need another attempt colonoscopy this admission.   Josilyn Shippee JR,Lynx Goodrich L 01/07/2016, 2:12 PM  This note was created using voice recognition software. Minor errors may Have occurred unintentionally.  Pager: [phone] If no answer or after hours call [phone]

## 2016-01-07 NOTE — Progress Notes (Signed)
[facility] Progress Note                                            Patient Demographics:    Nicholas Camacho, is a 65 y.o. male, DOB - 1951-08-03, ZOX:[id number]  Admit date - 01/05/2016   Admitting Physician Hillary Bow, DO  Outpatient Primary MD for the patient is Farris Has, MD  LOS - 1  Chief Complaint  Patient presents with  . Abnormal Lab        Subjective:   Feels a little weak and sleepy, ok otherwise    Assessment  & Plan :     Severe Iron deficiency anemia -Hb 5.5 on admission, from baseline of 10.5gm/dl last year -Xarelto stopped -s/p 3 units PRBC 4/7, hb 7.1 this am, will give another unit of blood today -[facility] consulting -Ct abd pelvis unremarkable -Underwent virtual colonoscopy in 2015 due to redundant colon, suboptimal study -plan for Endoscopy per GI, add PPI  COPD -stable, nebs PRN   P.AFib  -rate controlled -xarelto on hold -continue cardizem  Chronic pancreatitis  -noted on CT, no symptoms at this time  OSA -CPAP QHS  DVT proph: SCDs  Code Status : FUll Code  Family Communication  : none at bedside, discussed plan with pt  Disposition Plan  : Home vs SNF  Barriers For Discharge : pending Gi workup  Consults  :  [facility] DVT Prophylaxis  :  SCDS  Lab Results  Component Value Date   PLT 422* 01/07/2016    Anti-infectives    None        Objective:   Filed Vitals:   01/06/16 2040 01/07/16 0518 01/07/16 0907 01/07/16 0923  BP: 129/67 120/68  126/67  Pulse: 65 68    Temp: 98.5 F (36.9 C) 98.5 F (36.9 C)    TempSrc: Oral Oral    Resp: 18     Height:      Weight:      SpO2: 100% 95% 97%     Wt Readings from Last 3 Encounters:  01/06/16 95.7 kg (210 lb 15.7 oz)  01/05/16 95.31 kg (210 lb 1.9 oz)  01/04/16 95.981 kg (211 lb 9.6 oz)     Intake/Output Summary (Last 24 hours) at 01/07/16  0934 Last data filed at 01/06/16 1700  Gross per 24 hour  Intake    825 ml  Output      0 ml  Net    825 ml     Physical Exam  Awake Alert, Oriented X 3, No new F.N deficits, Normal affect HEENT:PERRLA, Supple Neck,No JVD, No cervical lymphadenopathy appriciated.  Lungs: Symmetrical Chest wall movement, Good air movement bilaterally, CTAB CVS: RRR,No Gallops,Rubs or new Murmurs, No Parasternal Heave Abd: +ve B.Sounds, Abd Soft, No tenderness, No organomegaly appriciated, No rebound - guarding or rigidity. Ext: No Cyanosis, Clubbing or edema, No new Rash or bruise      Data Review:    CBC  Recent Labs Lab 01/05/16 1700 01/05/16 2340 01/06/16 1052 01/06/16 1808 01/07/16 0428  WBC 10.2 9.5  --   --  8.0  HGB 5.5* 5.2* 6.7* 7.7* 7.1*  HCT 20.2* 21.1* 23.9* 27.5* 26.2*  PLT 515* 477*  --   --  422*  MCV 63.3* 68.3*  --   --  73.6*  MCH 17.2* 16.8*  --   --  19.9*  MCHC 26.7* 24.6*  --   --  27.1*  RDW 21.4* 22.1*  --   --  23.7*  LYMPHSABS 1836  --   --   --   --   MONOABS 816  --   --   --   --   EOSABS 306  --   --   --   --   BASOSABS 0  --   --   --   --     Chemistries   Recent Labs Lab 01/04/16 1204 01/05/16 2340 01/07/16 0428  NA 137 134* 141  K 3.7 3.9 4.3  CL 102 103 106  CO2 28 24 26   GLUCOSE 110* 109* 103*  BUN 20 20 17   CREATININE 1.19 1.36* 1.13  CALCIUM 9.4 8.9 8.8*  AST  --  18  --   ALT  --  13*  --   ALKPHOS  --  86  --   BILITOT  --  0.5  --    ------------------------------------------------------------------------------------------------------------------ No results for input(s): CHOL, HDL, LDLCALC, TRIG, CHOLHDL, LDLDIRECT in the last 72 hours.  No results found for: HGBA1C ------------------------------------------------------------------------------------------------------------------ No results for input(s): TSH, T4TOTAL, T3FREE, THYROIDAB in the last 72 hours.  Invalid input(s):  FREET3 ------------------------------------------------------------------------------------------------------------------  Recent Labs  01/06/16 0136 01/06/16 0541  VITAMINB12 358  --   FOLATE  --  9.2  FERRITIN 2*  --   TIBC 524*  --   IRON 11*  --   RETICCTPCT 4.6*  --     Coagulation profile  Recent Labs Lab 01/05/16 2340  INR 1.68*    No results for input(s): DDIMER in the last 72 hours.  Cardiac Enzymes No results for input(s): CKMB, TROPONINI, MYOGLOBIN in the last 168 hours.  Invalid input(s): CK ------------------------------------------------------------------------------------------------------------------ No results found for: BNP  Inpatient Medications  Scheduled Meds: . sodium chloride   Intravenous Once  . atorvastatin  10 mg Oral Daily  . diltiazem  120 mg Oral Daily  . furosemide  20 mg Oral Daily  . loratadine  10 mg Oral Daily  . mometasone-formoterol  2 puff Inhalation BID  . tiotropium  18 mcg Inhalation Daily   Continuous Infusions:  PRN Meds:.albuterol  Micro Results No results found for this or any previous visit (from the past 240 hour(s)).  Radiology Reports Dg Chest 2 View  01/04/2016  CLINICAL DATA:  Chest tightness, intermittent shortness of breath for 2-3 months, former smoking history EXAM: CHEST  2 VIEW COMPARISON:  Chest x-ray of 11/04/2015 FINDINGS: The lungs are clear but somewhat hyperaerated. Mediastinal and hilar contours are unremarkable. There is some peribronchial thickening present. The heart is within upper limits of normal. No bony abnormality is seen. IMPRESSION: 1. No change in hyper aeration and peribronchial thickening. 2. No active infiltrate or effusion. Electronically Signed   By: Dwyane DeePaul  Barry M.D.   On: 01/04/2016 11:44   Ct Abdomen Pelvis W Contrast  01/06/2016  CLINICAL DATA:  Anemia EXAM: CT ABDOMEN AND PELVIS WITH CONTRAST TECHNIQUE: Multidetector CT imaging of the abdomen and pelvis was performed using the  standard protocol following bolus administration of intravenous contrast. CONTRAST:  100 mL ISOVUE-300 IOPAMIDOL (ISOVUE-300) INJECTION 61% COMPARISON:  11/11/2013 FINDINGS: Lung bases are free of acute infiltrate or sizable effusion. The liver, spleen and adrenal glands are within normal limits. The pancreas shows diffuse calcifications and atrophy in its tail consistent with chronic pancreatitis. The gallbladder is well distended with multiple dependent stones. The kidneys are  well visualized bilaterally and demonstrate a normal enhancement and excretion pattern. A few scattered small hypodensities are noted within the left kidney likely representing cysts. There are too small for adequate characterization however. Fecal material is noted throughout the colon. No focal mass lesion is seen. The appendix is within normal limits. Mild aortoiliac calcifications are seen without aneurysmal dilatation. The bladder is well distended. No pelvic mass lesion is seen. The bony structures show prior fusion of the pubic symphysis. Degenerative changes of the sacroiliac joints are seen as well. No acute bony abnormality is noted. IMPRESSION: Progressive chronic pancreatitis in the tail. Cholelithiasis stable from the previous exam. No acute abnormality noted. Electronically Signed   By: Alcide Clever M.D.   On: 01/06/2016 19:56    Time Spent in minutes     Midge Momon M.D on 01/07/2016 at 9:34 AM  Between 7am to 7pm - Pager - [phone]  After 7pm go to [email or web address] - password [email or web address]  [facility] -  Office  [phone]

## 2016-01-08 ENCOUNTER — Encounter (HOSPITAL_COMMUNITY)
Admission: EM | Disposition: A | Discharge status: Home / Self Care | Payer: Self-pay | Source: Home / Self Care | Attending: Internal Medicine

## 2016-01-08 ENCOUNTER — Encounter (HOSPITAL_COMMUNITY): Payer: Self-pay | Admitting: *Deleted

## 2016-01-08 HISTORY — PX: ESOPHAGOGASTRODUODENOSCOPY: SHX5428

## 2016-01-08 LAB — CBC
HCT: 30.5 % — ABNORMAL LOW (ref 39.0–52.0)
HEMATOCRIT: 29.2 % — AB (ref 39.0–52.0)
Hemoglobin: 7.9 g/dL — ABNORMAL LOW (ref 13.0–17.0)
Hemoglobin: 8.3 g/dL — ABNORMAL LOW (ref 13.0–17.0)
MCH: 20.3 pg — AB (ref 26.0–34.0)
MCH: 20.4 pg — AB (ref 26.0–34.0)
MCHC: 27.1 g/dL — AB (ref 30.0–36.0)
MCHC: 27.2 g/dL — ABNORMAL LOW (ref 30.0–36.0)
MCV: 74.9 fL — ABNORMAL LOW (ref 78.0–100.0)
MCV: 75.1 fL — ABNORMAL LOW (ref 78.0–100.0)
PLATELETS: 401 10*3/uL — AB (ref 150–400)
Platelets: 409 10*3/uL — ABNORMAL HIGH (ref 150–400)
RBC: 3.9 MIL/uL — AB (ref 4.22–5.81)
RBC: 4.06 MIL/uL — AB (ref 4.22–5.81)
RDW: 24 % — ABNORMAL HIGH (ref 11.5–15.5)
RDW: 24.1 % — AB (ref 11.5–15.5)
WBC: 7.7 10*3/uL (ref 4.0–10.5)
WBC: 7 10*3/uL (ref 4.0–10.5)

## 2016-01-08 LAB — TYPE AND SCREEN
ABO/RH(D): O POS
ANTIBODY SCREEN: NEGATIVE
UNIT DIVISION: 0
UNIT DIVISION: 0
UNIT DIVISION: 0
Unit division: 0

## 2016-01-08 LAB — BASIC METABOLIC PANEL
Anion gap: 10 (ref 5–15)
BUN: 16 mg/dL (ref 6–20)
CHLORIDE: 104 mmol/L (ref 101–111)
CO2: 24 mmol/L (ref 22–32)
CREATININE: 1.15 mg/dL (ref 0.61–1.24)
Calcium: 8.7 mg/dL — ABNORMAL LOW (ref 8.9–10.3)
GFR calc non Af Amer: >60 mL/min (ref >60)
Glucose, Bld: 98 mg/dL (ref 65–99)
POTASSIUM: 3.9 mmol/L (ref 3.5–5.1)
SODIUM: 138 mmol/L (ref 135–145)

## 2016-01-08 SURGERY — EGD (ESOPHAGOGASTRODUODENOSCOPY)
Anesthesia: Moderate Sedation

## 2016-01-08 MED ORDER — EPINEPHRINE HCL 0.1 MG/ML IJ SOSY
PREFILLED_SYRINGE | INTRAMUSCULAR | Status: AC
  Filled 2016-01-08: qty 10

## 2016-01-08 MED ORDER — BUTAMBEN-TETRACAINE-BENZOCAINE 2-2-14 % EX AERO
INHALATION_SPRAY | CUTANEOUS | Status: DC | PRN
Start: 2016-01-08 — End: 2016-01-08
  Administered 2016-01-08: 2 via TOPICAL

## 2016-01-08 MED ORDER — MIDAZOLAM HCL 10 MG/2ML IJ SOLN
INTRAMUSCULAR | Status: DC | PRN
Start: 2016-01-08 — End: 2016-01-08
  Administered 2016-01-08 (×2): 1 mg via INTRAVENOUS
  Administered 2016-01-08: 2 mg via INTRAVENOUS

## 2016-01-08 MED ORDER — MIDAZOLAM HCL 5 MG/ML IJ SOLN
INTRAMUSCULAR | Status: AC
  Filled 2016-01-08: qty 2

## 2016-01-08 MED ORDER — FERUMOXYTOL INJECTION 510 MG/17 ML
510.0000 mg | Freq: Once | INTRAVENOUS | Status: AC
  Administered 2016-01-08: 510 mg via INTRAVENOUS
  Filled 2016-01-08: qty 17

## 2016-01-08 MED ORDER — FENTANYL CITRATE (PF) 100 MCG/2ML IJ SOLN
INTRAMUSCULAR | Status: DC | PRN
  Administered 2016-01-08: 25 ug via INTRAVENOUS

## 2016-01-08 MED ORDER — DIPHENHYDRAMINE HCL 50 MG/ML IJ SOLN
INTRAMUSCULAR | Status: AC
  Filled 2016-01-08: qty 1

## 2016-01-08 MED ORDER — FENTANYL CITRATE (PF) 100 MCG/2ML IJ SOLN
INTRAMUSCULAR | Status: AC
  Filled 2016-01-08: qty 2

## 2016-01-08 NOTE — Op Note (Signed)
[facility] Patient Name: Nicholas AlaminCharles Veale Procedure Date : 01/08/2016 MRN: [id number] Attending MD: Tresea MallJames L Koa Palla , MD Date of Birth: 03/07/1951 CSN: [id number] Age: [age] Admit Type: Inpatient Procedure:                Upper GI endoscopy Indications:              Iron deficiency anemia secondary to chronic blood                            loss, Iron deficiency anemia Providers:                Llana AlimentJames L. Randa EvensEdwards, MD, Tomma RakersJennifer Kappus, RN,                            Kandice RobinsonsGuillaume Awaka, Technician Referring MD:              Medicines:                Fentanyl 25 micrograms IV, Midazolam 4 mg IV,                            Cetacaine spray Complications:            No immediate complications. Estimated Blood Loss:     Estimated blood loss: none. Procedure:                Pre-Anesthesia Assessment:                           - Prior to the procedure, a History and Physical                            was performed, and patient medications and                            allergies were reviewed. The patient's tolerance of                            previous anesthesia was also reviewed. The risks                            and benefits of the procedure and the sedation                            options and risks were discussed with the patient.                            All questions were answered, and informed consent                            was obtained. Prior Anticoagulants: The patient has                            taken no previous anticoagulant or antiplatelet  agents. ASA Grade Assessment: II - A patient with                            mild systemic disease. After reviewing the risks                            and benefits, the patient was deemed in                            satisfactory condition to undergo the procedure.                           After obtaining informed consent, the endoscope was                            passed under direct  vision. Throughout the                            procedure, the patient's blood pressure, pulse, and                            oxygen saturations were monitored continuously. The                            EG-[id number] ([id number]) scope was introduced through the                            mouth, and advanced to the second part of duodenum.                            The upper GI endoscopy was accomplished without                            difficulty. The patient tolerated the procedure                            well. Findings:      Non-severe esophagitis with no bleeding was found in the lower third of       the esophagus. Biopsies were taken with a cold forceps for histology.      The stomach was normal.      The examined duodenum was normal. Biopsies for histology were taken with       a cold forceps for evaluation of celiac disease. Impression:               - Non-severe candidiasis esophagitis. Biopsied.                           - Normal stomach.                           - Normal examined duodenum. Biopsied.                           - Iron Deficiency Anemia Moderate Sedation:  Moderate (conscious) sedation was administered by the endoscopy nurse       and supervised by the endoscopist. The following parameters were       monitored: oxygen saturation, heart rate, blood pressure, respiratory       rate, EKG, adequacy of pulmonary ventilation, and response to care. Recommendation:           - Clear liquid diet.                           - Perform a colonoscopy at appointment to be                            scheduled.                           - will give IV iron                           - Continue present medications. Procedure Code(s):        --- Professional ---                           [phone], Esophagogastroduodenoscopy, flexible,                            transoral; with biopsy, single or multiple Diagnosis Code(s):        --- Professional ---                            B37.81, Candidal esophagitis                           D50.0, Iron deficiency anemia secondary to blood                            loss (chronic)                           D50.9, Iron deficiency anemia, unspecified CPT copyright 2016 [employer]. All rights reserved. The codes documented in this report are preliminary and upon coder review may  be revised to meet current compliance requirements. Carman Ching, MD Tresea Mall, MD 01/08/2016 8:30:33 AM This report has been signed electronically. Number of Addenda: 0

## 2016-01-08 NOTE — Interval H&P Note (Signed)
History and Physical Interval Note:  01/08/2016 7:59 AM  Nicholas Camacho  has presented today for surgery, with the diagnosis of anemia  The various methods of treatment have been discussed with the patient and family. After consideration of risks, benefits and other options for treatment, the patient has consented to  Procedure(s): ESOPHAGOGASTRODUODENOSCOPY (EGD) (N/A) as a surgical intervention .  The patient's history has been reviewed, patient examined, no change in status, stable for surgery.  I have reviewed the patient's chart and labs.  Questions were answered to the patient's satisfaction.     Naava Janeway JR,Eyleen Rawlinson L

## 2016-01-08 NOTE — H&P (View-Only) (Signed)
Reason for Consult: Microcytic anemia Referring Physician: Hospital team  Nicholas Camacho is an 65 y.o. male.  HPI: Patient seen and examined and his case discussed with the patient and his wife and our office computer chart and the hospital computer chart was reviewed and he is asymptomatic from a GI standpoint and supposedly had a incomplete colonoscopy years ago but we cannot find that report and they do not know who did it and a virtual colonoscopy in 2 years ago was reviewed and he has not had any other GI workup and specifically denies any upper tract symptoms swallowing problems or bowel movement problems has not seen any blood in his bowels nor any black stools however he has been on Cymbalta a baby aspirin and taking increased nonsteroidals frequently and hemoglobin last year was 10.8 with an MCV is 75 but was not brought to anyone's attention and his last hemoglobin in our office chart in 2015 was normal and was actually guaiac negative in the ER and his family history is negative for any GI problems and he has no other complaints  Past Medical History  Diagnosis Date  . Elevated cholesterol   . Disturbance of skin sensation 06/24/2013  . Sleep disturbance, unspecified 06/24/2013    Past Surgical History  Procedure Laterality Date  . Fractured pelvis    . Reconstruct left heel  2002  . Tonsillectomy    . Inguinal hernia repair Bilateral     Family History  Problem Relation Age of Onset  . Diabetes Mother   . Prostate cancer Father   . Kidney failure Father   . Alcoholism Sister     history of  . Drug abuse Sister     history of    Social History:  reports that he quit smoking about 6 weeks ago. His smoking use included Cigarettes. He has a 67.5 pack-year smoking history. He has never used smokeless tobacco. He reports that he does not drink alcohol or use illicit drugs.  Allergies:  Allergies  Allergen Reactions  . Morphine And Related     HALLUCINATIONS     Medications: I have reviewed the patient's current medications.  Results for orders placed or performed during the hospital encounter of 01/05/16 (from the past 48 hour(s))  Type and screen [place]     Status: None (Preliminary result)   Collection Time: 01/05/16 11:30 PM  Result Value Ref Range   ABO/RH(D) O POS    Antibody Screen NEG    Sample Expiration 01/08/2016    Unit Number [id number]    Blood Component Type RBC LR PHER2    Unit division 00    Status of Unit ISSUED    Transfusion Status OK TO TRANSFUSE    Crossmatch Result Compatible    Unit Number [id number]    Blood Component Type RBC LR PHER2    Unit division 00    Status of Unit ISSUED    Transfusion Status OK TO TRANSFUSE    Crossmatch Result Compatible    Unit Number [id number]    Blood Component Type RED CELLS,LR    Unit division 00    Status of Unit ISSUED    Transfusion Status OK TO TRANSFUSE    Crossmatch Result Compatible   ABO/Rh     Status: None   Collection Time: 01/05/16 11:30 PM  Result Value Ref Range   ABO/RH(D) O POS   Comprehensive metabolic panel     Status: Abnormal   Collection Time:  01/05/16 11:40 PM  Result Value Ref Range   Sodium 134 (L) 135 - 145 mmol/L   Potassium 3.9 3.5 - 5.1 mmol/L   Chloride 103 101 - 111 mmol/L   CO2 24 22 - 32 mmol/L   Glucose, Bld 109 (H) 65 - 99 mg/dL   BUN 20 6 - 20 mg/dL   Creatinine, Ser 1.36 (H) 0.61 - 1.24 mg/dL   Calcium 8.9 8.9 - 10.3 mg/dL   Total Protein 6.8 6.5 - 8.1 g/dL   Albumin 3.4 (L) 3.5 - 5.0 g/dL   AST 18 15 - 41 U/L   ALT 13 (L) 17 - 63 U/L   Alkaline Phosphatase 86 38 - 126 U/L   Total Bilirubin 0.5 0.3 - 1.2 mg/dL   GFR calc non Af Amer 53 (L) >60 mL/min   GFR calc Af Amer >60 >60 mL/min    Comment: (NOTE) The eGFR has been calculated using the CKD EPI equation. This calculation has not been validated in all clinical situations. eGFR's persistently <60 mL/min signify possible Chronic  Kidney Disease.    Anion gap 7 5 - 15  CBC     Status: Abnormal   Collection Time: 01/05/16 11:40 PM  Result Value Ref Range   WBC 9.5 4.0 - 10.5 K/uL   RBC 3.09 (L) 4.22 - 5.81 MIL/uL   Hemoglobin 5.2 (LL) 13.0 - 17.0 g/dL    Comment: REPEATED TO VERIFY CRITICAL RESULT CALLED TO, READ BACK BY AND VERIFIED WITH: K FIELDS,RN 2357 01/05/16 D BRADLEY    HCT 21.1 (L) 39.0 - 52.0 %   MCV 68.3 (L) 78.0 - 100.0 fL   MCH 16.8 (L) 26.0 - 34.0 pg   MCHC 24.6 (L) 30.0 - 36.0 g/dL   RDW 22.1 (H) 11.5 - 15.5 %   Platelets 477 (H) 150 - 400 K/uL  Protime-INR - (order if Patient is taking Coumadin / Warfarin)     Status: Abnormal   Collection Time: 01/05/16 11:40 PM  Result Value Ref Range   Prothrombin Time 19.8 (H) 11.6 - 15.2 seconds   INR 1.68 (H) 0.00 - 1.49  APTT     Status: None   Collection Time: 01/06/16 12:03 AM  Result Value Ref Range   aPTT 31 24 - 37 seconds  POC occult blood, ED     Status: None   Collection Time: 01/06/16 12:43 AM  Result Value Ref Range   Fecal Occult Bld NEGATIVE NEGATIVE  Prepare RBC     Status: None   Collection Time: 01/06/16  1:32 AM  Result Value Ref Range   Order Confirmation ORDER PROCESSED BY BLOOD BANK   Vitamin B12     Status: None   Collection Time: 01/06/16  1:36 AM  Result Value Ref Range   Vitamin B-12 358 180 - 914 pg/mL    Comment: (NOTE) This assay is not validated for testing neonatal or myeloproliferative syndrome specimens for Vitamin B12 levels.   Iron and TIBC     Status: Abnormal   Collection Time: 01/06/16  1:36 AM  Result Value Ref Range   Iron 11 (L) 45 - 182 ug/dL   TIBC 524 (H) 250 - 450 ug/dL   Saturation Ratios 2 (L) 17.9 - 39.5 %   UIBC 513 ug/dL  Ferritin     Status: Abnormal   Collection Time: 01/06/16  1:36 AM  Result Value Ref Range   Ferritin 2 (L) 24 - 336 ng/mL  Reticulocytes  Status: Abnormal   Collection Time: 01/06/16  1:36 AM  Result Value Ref Range   Retic Ct Pct 4.6 (H) 0.4 - 3.1 %   RBC. 2.91  (L) 4.22 - 5.81 MIL/uL   Retic Count, Manual 133.9 19.0 - 186.0 K/uL  Folate     Status: None   Collection Time: 01/06/16  5:41 AM  Result Value Ref Range   Folate 9.2 >5.9 ng/mL  Hemoglobin and hematocrit, blood     Status: Abnormal   Collection Time: 01/06/16 10:52 AM  Result Value Ref Range   Hemoglobin 6.7 (LL) 13.0 - 17.0 g/dL    Comment: REPEATED TO VERIFY POST TRANSFUSION SPECIMEN CRITICAL VALUE NOTED.  VALUE IS CONSISTENT WITH PREVIOUSLY REPORTED AND CALLED VALUE.    HCT 23.9 (L) 39.0 - 52.0 %  Prepare RBC     Status: None   Collection Time: 01/06/16 11:44 AM  Result Value Ref Range   Order Confirmation ORDER PROCESSED BY BLOOD BANK     No results found.  ROS negative except above Blood pressure 131/70, pulse 74, temperature 97.9 F (36.6 C), temperature source Oral, resp. rate 18, height _0  (1.753 m), weight 95.7 kg (210 lb 15.7 oz), SpO2 100 %. Physical Exam vital signs stable afebrile no acute distress decreased breath sounds bilaterally decreased heart sounds abdomen is soft nontender good bowel sounds minimal pedal edema labs reviewed  Assessment/Plan: Microcytic anemia in patient on xarelto baby aspirin and nonsteroidals Plan: We'll proceed with a CT first to rule out anything significant and if not an endoscopy and the risks benefits methods of that was discussed and if that is nondiagnostic probably an outpatient colonoscopy would be fine to complete his GI workup and then we will need cardiology to determine the bare minimum of blood thinners he needs and also he was instructed not to take nonsteroidals with blood thinners and will follow with you  Ileah Falkenstein E 01/06/2016, 2:18 PM

## 2016-01-08 NOTE — Progress Notes (Addendum)
[facility] Progress Note                                            Patient Demographics:    Nicholas Camacho, is a 65 y.o. male, DOB - [birth year], ZOX:[id number]  Admit date - 01/05/2016   Admitting Physician Hillary Bow, DO  Outpatient Primary MD for the patient is Farris Has, MD  LOS - 2  Chief Complaint  Patient presents with  . Abnormal Lab        Subjective:   Feels a little weak and sleepy, ok otherwise    Assessment  & Plan :     Severe Iron deficiency anemia -Hb 5.5 on admission, from baseline of 10.5gm/dl last year -Xarelto stopped -s/p 4 units PRBC thus far -[facility] consulting, EGD with esophagitis only, plan for colonoscopy tuesday -Ct abd pelvis unremarkable -Underwent virtual colonoscopy in 2015 due to redundant colon, suboptimal study -continue PPI -given IV iron -check Hb again this afternoon and in am, will likely need more blood  COPD -stable, nebs PRN   P.AFib  -rate controlled -xarelto on hold -continue cardizem  Chronic pancreatitis  -noted on CT, no symptoms at this time  OSA -CPAP QHS  DVT proph: SCDs  Code Status : FUll Code  Family Communication  :wife at bedside  Disposition Plan  : Home pending GI workup  Barriers For Discharge : pending Gi workup  Consults  :  [facility] DVT Prophylaxis  :  SCDS  Lab Results  Component Value Date   PLT 409* 01/08/2016    Anti-infectives    None        Objective:   Filed Vitals:   01/08/16 0820 01/08/16 0825 01/08/16 0830 01/08/16 0923  BP: 131/67 131/72 134/67 125/71  Pulse: 70 56 64 65  Temp:    98.5 F (36.9 C)  TempSrc:      Resp: Height:      Weight:      SpO2: 98% 93% 93% 96%    Wt Readings from Last 3 Encounters:  01/06/16 95.7 kg (210 lb 15.7 oz)  01/05/16 95.31 kg (210 lb 1.9 oz)  01/04/16 95.981 kg (211 lb 9.6 oz)      Intake/Output Summary (Last 24 hours) at 01/08/16 1022 Last data filed at 01/07/16 1700  Gross per 24 hour  Intake    900 ml  Output      0 ml  Net    900 ml     Physical Exam  Awake Alert, Oriented X 3, No new F.N deficits, Normal affect HEENT:PERRLA, Supple Neck,No JVD, No cervical lymphadenopathy appriciated.  Lungs: Symmetrical Chest wall movement, Good air movement bilaterally, CTAB CVS: RRR,No Gallops,Rubs or new Murmurs, No Parasternal Heave Abd: +ve B.Sounds, Abd Soft, No tenderness, No organomegaly appriciated, No rebound - guarding or rigidity. Ext: No Cyanosis, Clubbing or edema, No new Rash or bruise      Data Review:    CBC  Recent Labs Lab 01/05/16 1700 01/05/16 2340 01/06/16 1052 01/06/16 1808 01/07/16 0428 01/07/16 1819 01/08/16 0402  WBC 10.2 9.5  --   --  8.0 8.3 7.7  HGB 5.5* 5.2* 6.7* 7.7* 7.1* 8.2* 7.9*  HCT 20.2* 21.1* 23.9* 27.5* 26.2* 30.3* 29.2*  PLT 515* 477*  --   --  422* 415* 409*  MCV 63.3* 68.3*  --   --  73.6* 75.4* 74.9*  MCH 17.2* 16.8*  --   --  19.9* 20.4* 20.3*  MCHC 26.7* 24.6*  --   --  27.1* 27.1* 27.1*  RDW 21.4* 22.1*  --   --  23.7* 23.6* 24.0*  LYMPHSABS 1836  --   --   --   --   --   --   MONOABS 816  --   --   --   --   --   --   EOSABS 306  --   --   --   --   --   --   BASOSABS 0  --   --   --   --   --   --     Chemistries   Recent Labs Lab 01/04/16 1204 01/05/16 2340 01/07/16 0428 01/08/16 0402  NA 137 134* 141 138  K 3.7 3.9 4.3 3.9  CL 102 103 106 104  CO2 28 24 26 24   GLUCOSE 110* 109* 103* 98  BUN 20 20 17 16   CREATININE 1.19 1.36* 1.13 1.15  CALCIUM 9.4 8.9 8.8* 8.7*  AST  --  18  --   --   ALT  --  13*  --   --   ALKPHOS  --  86  --   --   BILITOT  --  0.5  --   --    ------------------------------------------------------------------------------------------------------------------ No results for input(s): CHOL, HDL, LDLCALC, TRIG, CHOLHDL, LDLDIRECT in the last 72 hours.  No results  found for: HGBA1C ------------------------------------------------------------------------------------------------------------------ No results for input(s): TSH, T4TOTAL, T3FREE, THYROIDAB in the last 72 hours.  Invalid input(s): FREET3 ------------------------------------------------------------------------------------------------------------------  Recent Labs  01/06/16 0136 01/06/16 0541  VITAMINB12 358  --   FOLATE  --  9.2  FERRITIN 2*  --   TIBC 524*  --   IRON 11*  --   RETICCTPCT 4.6*  --     Coagulation profile  Recent Labs Lab 01/05/16 2340  INR 1.68*    No results for input(s): DDIMER in the last 72 hours.  Cardiac Enzymes No results for input(s): CKMB, TROPONINI, MYOGLOBIN in the last 168 hours.  Invalid input(s): CK ------------------------------------------------------------------------------------------------------------------ No results found for: BNP  Inpatient Medications  Scheduled Meds: . sodium chloride   Intravenous Once  . atorvastatin  10 mg Oral Daily  . diltiazem  120 mg Oral Daily  . furosemide  20 mg Oral Daily  . loratadine  10 mg Oral Daily  . mometasone-formoterol  2 puff Inhalation BID  . pantoprazole (PROTONIX) IV  40 mg Intravenous Q12H  . tiotropium  18 mcg Inhalation Daily   Continuous Infusions:  PRN Meds:.albuterol  Micro Results No results found for this or any previous visit (from the past 240 hour(s)).  Radiology Reports Dg Chest 2 View  01/04/2016  CLINICAL DATA:  Chest tightness, intermittent shortness of breath for 2-3 months, former smoking history EXAM: CHEST  2 VIEW COMPARISON:  Chest x-ray of 11/04/2015 FINDINGS: The lungs are clear but somewhat hyperaerated. Mediastinal and hilar contours are unremarkable. There is some peribronchial thickening present. The heart is within upper limits of normal. No bony abnormality is seen. IMPRESSION: 1. No change in hyper aeration and peribronchial thickening. 2. No active  infiltrate or effusion. Electronically Signed   By: Dwyane DeePaul  Barry M.D.   On: 01/04/2016 11:44   Ct Abdomen Pelvis W Contrast  01/06/2016  CLINICAL DATA:  Anemia EXAM: CT ABDOMEN AND PELVIS WITH CONTRAST TECHNIQUE: Multidetector CT imaging of  the abdomen and pelvis was performed using the standard protocol following bolus administration of intravenous contrast. CONTRAST:  100 mL ISOVUE-300 IOPAMIDOL (ISOVUE-300) INJECTION 61% COMPARISON:  11/11/2013 FINDINGS: Lung bases are free of acute infiltrate or sizable effusion. The liver, spleen and adrenal glands are within normal limits. The pancreas shows diffuse calcifications and atrophy in its tail consistent with chronic pancreatitis. The gallbladder is well distended with multiple dependent stones. The kidneys are well visualized bilaterally and demonstrate a normal enhancement and excretion pattern. A few scattered small hypodensities are noted within the left kidney likely representing cysts. There are too small for adequate characterization however. Fecal material is noted throughout the colon. No focal mass lesion is seen. The appendix is within normal limits. Mild aortoiliac calcifications are seen without aneurysmal dilatation. The bladder is well distended. No pelvic mass lesion is seen. The bony structures show prior fusion of the pubic symphysis. Degenerative changes of the sacroiliac joints are seen as well. No acute bony abnormality is noted. IMPRESSION: Progressive chronic pancreatitis in the tail. Cholelithiasis stable from the previous exam. No acute abnormality noted. Electronically Signed   By: Alcide Clever M.D.   On: 01/06/2016 19:56    Time Spent in minutes     Shatavia Santor M.D on 01/08/2016 at 10:22 AM  Between 7am to 7pm - Pager - [phone]  After 7pm go to [email or web address] - password [facility]  [facility] -  Office  [phone]

## 2016-01-09 LAB — BASIC METABOLIC PANEL
ANION GAP: 10 (ref 5–15)
BUN: 11 mg/dL (ref 6–20)
CHLORIDE: 103 mmol/L (ref 101–111)
CO2: 27 mmol/L (ref 22–32)
Calcium: 9.2 mg/dL (ref 8.9–10.3)
Creatinine, Ser: 1.2 mg/dL (ref 0.61–1.24)
GFR calc Af Amer: >60 mL/min (ref >60)
Glucose, Bld: 94 mg/dL (ref 65–99)
POTASSIUM: 3.8 mmol/L (ref 3.5–5.1)
SODIUM: 140 mmol/L (ref 135–145)

## 2016-01-09 LAB — CBC
HCT: 31.3 % — ABNORMAL LOW (ref 39.0–52.0)
Hemoglobin: 8.4 g/dL — ABNORMAL LOW (ref 13.0–17.0)
MCH: 20.1 pg — ABNORMAL LOW (ref 26.0–34.0)
MCHC: 26.8 g/dL — ABNORMAL LOW (ref 30.0–36.0)
MCV: 75.1 fL — ABNORMAL LOW (ref 78.0–100.0)
PLATELETS: 420 10*3/uL — AB (ref 150–400)
RBC: 4.17 MIL/uL — AB (ref 4.22–5.81)
RDW: 24.5 % — ABNORMAL HIGH (ref 11.5–15.5)
WBC: 8 10*3/uL (ref 4.0–10.5)

## 2016-01-09 MED ORDER — POLYETHYLENE GLYCOL 3350 17 GM/SCOOP PO POWD
1.0000 | Freq: Once | ORAL | Status: AC
  Administered 2016-01-09: 255 g via ORAL
  Filled 2016-01-09: qty 255

## 2016-01-09 NOTE — Progress Notes (Signed)
The patient is feeling better after blood transfusion. In ED he was heme negative. In 2005 I did a colonoscopy and he had a very tortuous colon and I was only able to get to the mid transverse colon. This was followed by an air contrast BE which showed a very tortuous colon and diverticulosis. He had a screening virtual colonoscopy in 2015 which did not show any major lesions. I doubt that attempting a colonoscopy again would be successful.  We could consider a BE again, or just treat with iron, and follow his Hgb.

## 2016-01-09 NOTE — Progress Notes (Signed)
[facility] Progress Note                                            Patient Demographics:    Nicholas Camacho, is a 65 y.o. male, DOB - 1950-11-06, WUJ:[id number]  Admit date - 01/05/2016   Admitting Physician Hillary Bow, DO  Outpatient Primary MD for the patient is Farris Has, MD  LOS - 3  Chief Complaint  Patient presents with  . Abnormal Lab        Subjective:   Feels a little weak and sleepy, ok otherwise    Assessment  & Plan :     Severe Iron deficiency anemia -Hb 5.5 on admission, from baseline of 10.5gm/dl last year -Xarelto stopped -s/p 4 units PRBC thus far -[facility] consulting, EGD with esophagitis only -Ct abd pelvis unremarkable -Underwent virtual colonoscopy in 2015 due to redundant colon, suboptimal study -continue PPI -given IV iron -d/w Dr.Ganem: recommending Ba enema at this time  COPD -stable, nebs PRN   P.AFib  -rate controlled -xarelto on hold -continue cardizem  Chronic pancreatitis  -noted on CT, no symptoms at this time  OSA -CPAP QHS  DVT proph: SCDs  Code Status : FUll Code  Family Communication  :wife at bedside  Disposition Plan  : Home pending GI workup  Barriers For Discharge : pending Gi workup  Consults  :  [facility] DVT Prophylaxis  :  SCDS  Lab Results  Component Value Date   PLT 420* 01/09/2016    Anti-infectives    None        Objective:   Filed Vitals:   01/08/16 0923 01/08/16 2055 01/09/16 0530 01/09/16 1048  BP: 125/71 138/83 104/56 123/75  Pulse: 65 57 64   Temp: 98.5 F (36.9 C) 97.4 F (36.3 C) 98.3 F (36.8 C)   TempSrc:  Oral Oral   Resp: Height:      Weight:      SpO2: 96% 97% 95%     Wt Readings from Last 3 Encounters:  01/06/16 95.7 kg (210 lb 15.7 oz)  01/05/16 95.31 kg (210 lb 1.9 oz)  01/04/16 95.981 kg (211 lb 9.6 oz)     Intake/Output  Summary (Last 24 hours) at 01/09/16 1438 Last data filed at 01/09/16 0800  Gross per 24 hour  Intake    480 ml  Output      0 ml  Net    480 ml     Physical Exam  Awake Alert, Oriented X 3, No new F.N deficits, Normal affect HEENT:PERRLA, Supple Neck,No JVD, No cervical lymphadenopathy appriciated.  Lungs: Symmetrical Chest wall movement, Good air movement bilaterally, CTAB CVS: RRR,No Gallops,Rubs or new Murmurs, No Parasternal Heave Abd: +ve B.Sounds, Abd Soft, No tenderness, No organomegaly appriciated, No rebound - guarding or rigidity. Ext: No Cyanosis, Clubbing or edema, No new Rash or bruise      Data Review:    CBC  Recent Labs Lab 01/05/16 1700  01/07/16 0428 01/07/16 1819 01/08/16 0402 01/08/16 1415 01/09/16 0258  WBC 10.2  < > 8.0 8.3 7.7 7.0 8.0  HGB 5.5*  < > 7.1* 8.2* 7.9* 8.3* 8.4*  HCT 20.2*  < > 26.2* 30.3* 29.2* 30.5* 31.3*  PLT 515*  < > 422* 415* 409* 401* 420*  MCV 63.3*  < > 73.6* 75.4*  74.9* 75.1* 75.1*  MCH 17.2*  < > 19.9* 20.4* 20.3* 20.4* 20.1*  MCHC 26.7*  < > 27.1* 27.1* 27.1* 27.2* 26.8*  RDW 21.4*  < > 23.7* 23.6* 24.0* 24.1* 24.5*  LYMPHSABS 1836  --   --   --   --   --   --   MONOABS 816  --   --   --   --   --   --   EOSABS 306  --   --   --   --   --   --   BASOSABS 0  --   --   --   --   --   --   < > = values in this interval not displayed.  Chemistries   Recent Labs Lab 01/04/16 1204 01/05/16 2340 01/07/16 0428 01/08/16 0402 01/09/16 0258  NA 137 134* 141 138 140  K 3.7 3.9 4.3 3.9 3.8  CL 102 103 106 104 103  CO2 28 24 26 24 27   GLUCOSE 110* 109* 103* 98 94  BUN 20 20 17 16 11   CREATININE 1.19 1.36* 1.13 1.15 1.20  CALCIUM 9.4 8.9 8.8* 8.7* 9.2  AST  --  18  --   --   --   ALT  --  13*  --   --   --   ALKPHOS  --  86  --   --   --   BILITOT  --  0.5  --   --   --    ------------------------------------------------------------------------------------------------------------------ No results for input(s): CHOL,  HDL, LDLCALC, TRIG, CHOLHDL, LDLDIRECT in the last 72 hours.  No results found for: HGBA1C ------------------------------------------------------------------------------------------------------------------ No results for input(s): TSH, T4TOTAL, T3FREE, THYROIDAB in the last 72 hours.  Invalid input(s): FREET3 ------------------------------------------------------------------------------------------------------------------ No results for input(s): VITAMINB12, FOLATE, FERRITIN, TIBC, IRON, RETICCTPCT in the last 72 hours.  Coagulation profile  Recent Labs Lab 01/05/16 2340  INR 1.68*    No results for input(s): DDIMER in the last 72 hours.  Cardiac Enzymes No results for input(s): CKMB, TROPONINI, MYOGLOBIN in the last 168 hours.  Invalid input(s): CK ------------------------------------------------------------------------------------------------------------------ No results found for: BNP  Inpatient Medications  Scheduled Meds: . sodium chloride   Intravenous Once  . atorvastatin  10 mg Oral Daily  . diltiazem  120 mg Oral Daily  . furosemide  20 mg Oral Daily  . loratadine  10 mg Oral Daily  . mometasone-formoterol  2 puff Inhalation BID  . pantoprazole (PROTONIX) IV  40 mg Intravenous Q12H  . tiotropium  18 mcg Inhalation Daily   Continuous Infusions:  PRN Meds:.albuterol  Micro Results No results found for this or any previous visit (from the past 240 hour(s)).  Radiology Reports Dg Chest 2 View  01/04/2016  CLINICAL DATA:  Chest tightness, intermittent shortness of breath for 2-3 months, former smoking history EXAM: CHEST  2 VIEW COMPARISON:  Chest x-ray of 11/04/2015 FINDINGS: The lungs are clear but somewhat hyperaerated. Mediastinal and hilar contours are unremarkable. There is some peribronchial thickening present. The heart is within upper limits of normal. No bony abnormality is seen. IMPRESSION: 1. No change in hyper aeration and peribronchial thickening. 2. No  active infiltrate or effusion. Electronically Signed   By: Dwyane DeePaul  Barry M.D.   On: 01/04/2016 11:44   Ct Abdomen Pelvis W Contrast  01/06/2016  CLINICAL DATA:  Anemia EXAM: CT ABDOMEN AND PELVIS WITH CONTRAST TECHNIQUE: Multidetector CT imaging of the abdomen and pelvis was performed using  the standard protocol following bolus administration of intravenous contrast. CONTRAST:  100 mL ISOVUE-300 IOPAMIDOL (ISOVUE-300) INJECTION 61% COMPARISON:  11/11/2013 FINDINGS: Lung bases are free of acute infiltrate or sizable effusion. The liver, spleen and adrenal glands are within normal limits. The pancreas shows diffuse calcifications and atrophy in its tail consistent with chronic pancreatitis. The gallbladder is well distended with multiple dependent stones. The kidneys are well visualized bilaterally and demonstrate a normal enhancement and excretion pattern. A few scattered small hypodensities are noted within the left kidney likely representing cysts. There are too small for adequate characterization however. Fecal material is noted throughout the colon. No focal mass lesion is seen. The appendix is within normal limits. Mild aortoiliac calcifications are seen without aneurysmal dilatation. The bladder is well distended. No pelvic mass lesion is seen. The bony structures show prior fusion of the pubic symphysis. Degenerative changes of the sacroiliac joints are seen as well. No acute bony abnormality is noted. IMPRESSION: Progressive chronic pancreatitis in the tail. Cholelithiasis stable from the previous exam. No acute abnormality noted. Electronically Signed   By: Alcide Clever M.D.   On: 01/06/2016 19:56    Time Spent in minutes     Avinash Maltos M.D on 01/09/2016 at 2:38 PM  Between 7am to 7pm - Pager - [phone]  After 7pm go to [email or web address] - password [facility]  [facility] -  Office  [phone]

## 2016-01-10 ENCOUNTER — Inpatient Hospital Stay (HOSPITAL_COMMUNITY): Payer: BLUE CROSS/BLUE SHIELD

## 2016-01-10 ENCOUNTER — Encounter (HOSPITAL_COMMUNITY): Payer: Self-pay | Admitting: Gastroenterology

## 2016-01-10 LAB — BASIC METABOLIC PANEL
Anion gap: 11 (ref 5–15)
BUN: 9 mg/dL (ref 6–20)
CALCIUM: 9.6 mg/dL (ref 8.9–10.3)
CO2: 26 mmol/L (ref 22–32)
CREATININE: 1.17 mg/dL (ref 0.61–1.24)
Chloride: 103 mmol/L (ref 101–111)
GFR calc non Af Amer: >60 mL/min (ref >60)
Glucose, Bld: 92 mg/dL (ref 65–99)
Potassium: 3.7 mmol/L (ref 3.5–5.1)
SODIUM: 140 mmol/L (ref 135–145)

## 2016-01-10 LAB — CBC
HEMATOCRIT: 33.8 % — AB (ref 39.0–52.0)
Hemoglobin: 9.1 g/dL — ABNORMAL LOW (ref 13.0–17.0)
MCH: 20.3 pg — AB (ref 26.0–34.0)
MCHC: 26.9 g/dL — AB (ref 30.0–36.0)
MCV: 75.3 fL — ABNORMAL LOW (ref 78.0–100.0)
Platelets: 434 10*3/uL — ABNORMAL HIGH (ref 150–400)
RBC: 4.49 MIL/uL (ref 4.22–5.81)
RDW: 25.1 % — AB (ref 11.5–15.5)
WBC: 9 10*3/uL (ref 4.0–10.5)

## 2016-01-10 MED ORDER — IRON 90 (18 FE) MG PO TABS: 1.0000 | ORAL_TABLET | Freq: Two times a day (BID) | ORAL | Status: DC

## 2016-01-10 NOTE — Telephone Encounter (Signed)
Nicholas Camacho have you spoken with the pt about his results? Thanks.

## 2016-01-10 NOTE — Discharge Summary (Signed)
Physician Discharge Summary  Nicholas Camacho WUJ:[id number] DOB: 05/12/1951 DOA: 01/05/2016  PCP: [name], [name], MD  Admit date: 01/05/2016 Discharge date: 01/10/2016  Time spent: 45 minutes  Recommendations for Outpatient Follow-up:  1. PCP Dr.Morrow in 1 week, please repeat CBC, FU anemia panel q2-93months, i have stopped his Xarelto, will need this re-assessed in 2-3 months to determine if safe to re-attempt 2. [facility] Dr.Edwards or Dr.Ganem-will need evaluation for Capsule endoscopy   Discharge Diagnoses:    Severe Iron deficiency anemia   COPD   Chronic diastolic CHF   Atrial fibrillation (HCC)   Microcytic hypochromic anemia   Chronic pancreatitis noted on CT    Discharge Condition: stable  Diet recommendation: heart healthy  Filed Weights   01/05/16 2323 01/06/16 0217  Weight: 95.709 kg (211 lb) 95.7 kg (210 lb 15.7 oz)    History of present illness:  Chief Complaint: Anemia HPI: Nicholas NakayamaCharles M Briggs is a 65 y.o. 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 Patient presented to the ED from his cardiologists office where he was seen for complaint of several month history of progressive generalized weakness, fatigue, SOB, 13 lb weight gain, swelling. They felt that it was likely he had new onset R sided heart failure. Work up however revealed that the likely cause of this heart failure was a new and severe anemia with HGB of 5.5. In the ED this was confirmed to be HGB 5.2  Hospital Course:  Severe Iron deficiency anemia -Hb 5.5 on admission, from baseline of 10.5gm/dl last year -Xarelto stopped -required 4 units PRBC this admission, Hb stable since then in 8-9range [facility] consulted, EGD with esophagitis only -Ct abd pelvis unremarkable -Underwent virtual colonoscopy in 2015 due to redundant colon, suboptimal study -he was given multiple doses of IV Iron -Dr.Ganem recommended Ba enema instead of colonoscopy due to redundant colon and  technical difficulty when this was attempted many years ago, Ba enema was normal and negative for masses, polyps or strictures and only showed mild diverticulosis. -he remains stable and will be discharged home today, advised Gi FU for eval for Capsule endoscopy and needs monitoring og Hb and iron stores.  COPD -stable, nebs PRN  P.AFib  -rate controlled -xarelto stopped due to severe Iron defi anemia: will need this re-assessed in 2-3 months to determine if safe to re-attempt anticoagulation -continue cardizem  Chronic pancreatitis  -noted on CT, no symptoms at this time -will need to be monitored  OSA -CPAP QHS  Procedures:  EGD  Ba-Enema:   Consultations:  [facility]  Discharge Exam: Filed Vitals:   01/10/16 0405 01/10/16 1035  BP: 140/80   Pulse: 59 79  Temp: 98.1 F (36.7 C)   Resp: 18 18    General: AAOx3 Cardiovascular: S1S2/RRR Respiratory: CTAB  Discharge Instructions   Discharge Instructions    Diet - low sodium heart healthy    Complete by:  As directed      Increase activity slowly    Complete by:  As directed           Current Discharge Medication List    START taking these medications   Details  Ferrous Sulfate (IRON) 90 (18 Fe) MG TABS Take 1 tablet by mouth 2 (two) times daily with a meal. Qty: 60 each, Refills: 0      CONTINUE these medications which have NOT CHANGED   Details  albuterol (PROAIR HFA) 108 (90 Base) MCG/ACT inhaler Inhale 2  puffs into the lungs every 6 (six) hours as needed for wheezing or shortness of breath.    albuterol (PROVENTIL) (2.5 MG/3ML) 0.083% nebulizer solution Take 3 mLs (2.5 mg total) by nebulization every 6 (six) hours as needed for wheezing or shortness of breath. Qty: 75 mL, Refills: 12    atorvastatin (LIPITOR) 10 MG tablet Take 10 mg by mouth daily.    cetirizine (ZYRTEC) 10 MG tablet Take 10 mg by mouth daily.    diltiazem (CARTIA XT) 120 MG 24 hr capsule Take 120 mg by mouth daily.     furosemide (LASIX) 20 MG tablet Take 1 tablet (20 mg total) by mouth daily. Qty: 90 tablet, Refills: 3   Associated Diagnoses: Shortness of breath    mometasone-formoterol (DULERA) 200-5 MCG/ACT AERO Inhale 2 puffs into the lungs 2 (two) times daily. Take 2 puffs in the lungs first thing in am and then another 2 puffs in lungs about 12 hours later.    Tiotropium Bromide Monohydrate (SPIRIVA RESPIMAT) 2.5 MCG/ACT AERS Inhale 2 puffs into the lungs daily. Qty: 3 Inhaler, Refills: 3      STOP taking these medications     aspirin 81 MG tablet      rivaroxaban (XARELTO) 20 MG TABS tablet        Allergies  Allergen Reactions  . Morphine And Related     HALLUCINATIONS   Follow-up Information    Follow up with Farris Has, MD. Schedule an appointment as soon as possible for a visit in 1 week.   Specialty:  Family Medicine   Why:  please check CBC at FU   Contact information:   [address] [place] [address] [phone]       Follow up with EDWARDS JR,JAMES L, MD In 2 weeks.   Specialty:  Gastroenterology   Why:  or Dr.Ganem-Office will call you with FU   Contact information:   [address] [place] [address] [phone]        The results of significant diagnostics from this hospitalization (including imaging, microbiology, ancillary and laboratory) are listed below for reference.    Significant Diagnostic Studies: Dg Chest 2 View  01/04/2016  CLINICAL DATA:  Chest tightness, intermittent shortness of breath for 2-3 months, former smoking history EXAM: CHEST  2 VIEW COMPARISON:  Chest x-ray of 11/04/2015 FINDINGS: The lungs are clear but somewhat hyperaerated. Mediastinal and hilar contours are unremarkable. There is some peribronchial thickening present. The heart is within upper limits of normal. No bony abnormality is seen. IMPRESSION: 1. No change in hyper aeration and peribronchial thickening. 2. No active infiltrate or  effusion. Electronically Signed   By: Dwyane Dee M.D.   On: 01/04/2016 11:44   Ct Abdomen Pelvis W Contrast  01/06/2016  CLINICAL DATA:  Anemia EXAM: CT ABDOMEN AND PELVIS WITH CONTRAST TECHNIQUE: Multidetector CT imaging of the abdomen and pelvis was performed using the standard protocol following bolus administration of intravenous contrast. CONTRAST:  100 mL ISOVUE-300 IOPAMIDOL (ISOVUE-300) INJECTION 61% COMPARISON:  11/11/2013 FINDINGS: Lung bases are free of acute infiltrate or sizable effusion. The liver, spleen and adrenal glands are within normal limits. The pancreas shows diffuse calcifications and atrophy in its tail consistent with chronic pancreatitis. The gallbladder is well distended with multiple dependent stones. The kidneys are well visualized bilaterally and demonstrate a normal enhancement and excretion pattern. A few scattered small hypodensities are noted within the left kidney likely representing cysts. There are too  small for adequate characterization however. Fecal material is noted throughout the colon. No focal mass lesion is seen. The appendix is within normal limits. Mild aortoiliac calcifications are seen without aneurysmal dilatation. The bladder is well distended. No pelvic mass lesion is seen. The bony structures show prior fusion of the pubic symphysis. Degenerative changes of the sacroiliac joints are seen as well. No acute bony abnormality is noted. IMPRESSION: Progressive chronic pancreatitis in the tail. Cholelithiasis stable from the previous exam. No acute abnormality noted. Electronically Signed   By: Alcide Clever M.D.   On: 01/06/2016 19:56   Dg Colon Ramond Craver Hi Den Cm  01/10/2016  CLINICAL DATA:  65 year old with anemia and history of failed colonoscopy. EXAM: AIR CONTRAST BARIUM ENEMA TECHNIQUE: Initial scout AP supine abdominal image obtained to insure adequate colon cleansing. Barium was introduced into the colon in a retrograde fashion and refluxed from the rectum  to the distal transverse colon. As much of the barium as possible was then removed through the indwelling tube via gravity drain. Air was then insufflated into the colon. Spot images of the colon followed by overhead radiographs were obtained. FLUOROSCOPY TIME:  Radiation Exposure Index (as provided by the fluoroscopic device): 2520 uGy*m2 If the device does not provide the exposure index: Fluoroscopy Time:  1 minute and 24 seconds Number of Acquired Images: 1 scout image. Eleven overhead radiographs. COMPARISON:  Barium enema 05/03/2004. Abdominal pelvic CT 01/06/2016. FINDINGS: The scout abdominal radiograph demonstrates a normal bowel gas pattern. There are calcifications within the pancreatic tail as demonstrated on recent CT. Postsurgical changes are present along the symphysis pubis. Contrast was refluxed through the colon which is mildly tortuous. There are scattered diverticula. There is filling of the cecum, terminal ileum and appendix. No evidence of mass, stricture or ulceration. IMPRESSION: No cause for anemia identified. No evidence of polyp, mass or stricture. Mild diverticulosis. Electronically Signed   By: Carey Bullocks M.D.   On: 01/10/2016 10:09    Microbiology: No results found for this or any previous visit (from the past 240 hour(s)).   Labs: Basic Metabolic Panel:  Recent Labs Lab 01/05/16 2340 01/07/16 0428 01/08/16 0402 01/09/16 0258 01/10/16 0306  NA 134* 141 138 140 140  K 3.9 4.3 3.9 3.8 3.7  CL 103 106 104 103 103  CO2 GLUCOSE 109* 103* 98 94 92  BUN CREATININE 1.36* 1.13 1.15 1.20 1.17  CALCIUM 8.9 8.8* 8.7* 9.2 9.6   Liver Function Tests:  Recent Labs Lab 01/05/16 2340  AST 18  ALT 13*  ALKPHOS 86  BILITOT 0.5  PROT 6.8  ALBUMIN 3.4*   No results for input(s): LIPASE, AMYLASE in the last 168 hours. No results for input(s): AMMONIA in the last 168 hours. CBC:  Recent Labs Lab 01/05/16 1700  01/07/16 1819  01/08/16 0402 01/08/16 1415 01/09/16 0258 01/10/16 0306  WBC 10.2  < > 8.3 7.7 7.0 8.0 9.0  NEUTROABS 7242  --   --   --   --   --   --   HGB 5.5*  < > 8.2* 7.9* 8.3* 8.4* 9.1*  HCT 20.2*  < > 30.3* 29.2* 30.5* 31.3* 33.8*  MCV 63.3*  < > 75.4* 74.9* 75.1* 75.1* 75.3*  PLT 515*  < > 415* 409* 401* 420* 434*  < > = values in this interval not displayed. Cardiac Enzymes: No results for input(s): CKTOTAL, CKMB, CKMBINDEX, TROPONINI in the  last 168 hours. BNP: BNP (last 3 results) No results for input(s): BNP in the last 8760 hours.  ProBNP (last 3 results)  Recent Labs  06/20/15 1441 01/04/16 1204  PROBNP 20.0 55.0    CBG: No results for input(s): GLUCAP in the last 168 hours.     SignedZannie Cove MD.  [facility] 01/10/2016, 11:55 AM

## 2016-01-11 NOTE — Telephone Encounter (Signed)
Patient aware of results via Cardiology office.  Pt was sent to ER based upon CBC results at Cardio. In Epic. Nothing further needed.

## 2016-01-12 ENCOUNTER — Other Ambulatory Visit: Payer: BLUE CROSS/BLUE SHIELD

## 2016-01-12 ENCOUNTER — Telehealth: Payer: Self-pay | Admitting: Physician Assistant

## 2016-01-12 ENCOUNTER — Encounter: Payer: Self-pay | Admitting: Physician Assistant

## 2016-01-12 ENCOUNTER — Encounter (HOSPITAL_COMMUNITY): Payer: BLUE CROSS/BLUE SHIELD

## 2016-01-12 NOTE — Telephone Encounter (Signed)
Please tell patient his echo shows normal LVEF and moderate stiffness.  The moderate stiffness can contribute to shortness of breath and swelling.  However, his recent bout of shortness of breath and edema was likely related to his severe anemia.   Continue with current treatment plan. Tereso NewcomerScott Fadil Macmaster, PA-C   01/12/2016 5:08 PM

## 2016-01-13 NOTE — Telephone Encounter (Signed)
Ok to hold off on Myoview FU with Dr. Tobias AlexanderKatarina Nelson as planned Tereso NewcomerScott Derrall Hicks, PA-C   01/13/2016 1:53 PM

## 2016-01-13 NOTE — Telephone Encounter (Signed)
Pt notified of echo results and findings. We discussed per recommendation from PA he would like to hold off on myoview and see how pt is feeling before proceeding. Pt denies any cp. and he is very much in agreement with holding off on myoview. Pt states he is not sure if he really needs the stress test. I advised pt to keep his appt with [name] 4/19. Pt stated he would like to thank [name], [facility] for the fore site he had in ordering the lab work that caught the severe anemia. Pt also said he would like to be able to possibly thank Lorin PicketScott himself while he is here seeing [name] 4/19. I advised pt that Lorin PicketScott is not in clinic on Wed, though I certainly will pass this message along to [name], PAC. Pt thanked  me for our help.

## 2016-01-16 ENCOUNTER — Ambulatory Visit: Payer: BLUE CROSS/BLUE SHIELD | Admitting: Adult Health

## 2016-01-18 ENCOUNTER — Encounter: Payer: Self-pay | Admitting: Cardiology

## 2016-01-18 ENCOUNTER — Ambulatory Visit (INDEPENDENT_AMBULATORY_CARE_PROVIDER_SITE_OTHER): Payer: BLUE CROSS/BLUE SHIELD | Admitting: Cardiology

## 2016-01-18 VITALS — BP 136/76 | HR 72 | Ht 69.0 in | Wt 210.0 lb

## 2016-01-18 DIAGNOSIS — I10 Essential (primary) hypertension: Secondary | ICD-10-CM

## 2016-01-18 DIAGNOSIS — D509 Iron deficiency anemia, unspecified: Secondary | ICD-10-CM

## 2016-01-18 DIAGNOSIS — E785 Hyperlipidemia, unspecified: Secondary | ICD-10-CM

## 2016-01-18 DIAGNOSIS — I714 Abdominal aortic aneurysm, without rupture, unspecified: Secondary | ICD-10-CM

## 2016-01-18 DIAGNOSIS — R072 Precordial pain: Secondary | ICD-10-CM | POA: Diagnosis not present

## 2016-01-18 DIAGNOSIS — I480 Paroxysmal atrial fibrillation: Secondary | ICD-10-CM

## 2016-01-18 NOTE — Patient Instructions (Addendum)
Your physician recommends that you continue on your current medications as directed. Please refer to the Current Medication list given to you today.  Your physician recommends that you schedule a follow-up appointment in: 6 WEEKS WITH DR NELSON  

## 2016-01-18 NOTE — Progress Notes (Signed)
Patient ID: Nicholas Camacho, male   DOB: 01/05/1951, 65 y.o.   MRN: [id number]    Cardiology Office Note:    Date:  01/18/2016   ID:  Nicholas Nakayamaharles M Camacho, DOB 11/13/1950, MRN [id number]  PCP:  [name], [name], MD  Cardiologist:  Dr. Tobias AlexanderKatarina Sophiya Morello   Electrophysiologist:  n/a  Referring MD: [name], [name], MD   No chief complaint on file.   History of Present Illness:     Nicholas NakayamaCharles M Sarracino is a 65 y.o. male with a hx of HTN, HL, COPD, PAF, OSA, small AAA and iliac aneurysm. CHADS2-VASc=3 (HTN, prior TIA).  He is on Xarelto for anticoagulation. Last seen by [name] 4/16. He noted significant dyspnea on exertion.  Nuclear stress test was felt to be low risk.   Recently seen in pulmonology in follow-up. He complained of lower extremity edema and was placed on Lasix. He was just seen again yesterday with continued LE edema. Dyspnea was also worse. He also admitted to chest discomfort. He is added on for further evaluation.  The patient was seen by [name] on April 6 and complained of profound fatigue and dyspnea on exertion. His CBC showed hemoglobin of 5.2 and [facility] . He received 4 units of blood his Xarelto was discontinued he had very low iron levels and received IV iron. GI performed upper EGD that showed esophagitis and colonoscopy didn't reveal anything. He supposed to follow with his GI doctor tomorrow to determine if he needs capsule endoscopy. His most recent hemoglobin was 9.1 on April 11 when he was discharged from the hospital. He feels significantly better which much more energy less shortness of breath and no chest pain. He denies any palpitations or syncope.   Past Medical History  Diagnosis Date  . Elevated cholesterol   . Disturbance of skin sensation 06/24/2013  . Sleep disturbance, unspecified 06/24/2013  . History of echocardiogram     Echo 4/17: EF 60-65%, no RWMA, Gr 2 DD    Past Surgical History  Procedure Laterality Date  .  Fractured pelvis    . Reconstruct left heel  2002  . Tonsillectomy    . Inguinal hernia repair Bilateral   . Esophagogastroduodenoscopy N/A 01/08/2016    Procedure: ESOPHAGOGASTRODUODENOSCOPY (EGD);  Surgeon: [name], MD;  [facility];  Service: Endoscopy;  Laterality: N/A;    Current Medications: Outpatient Prescriptions Prior to Visit  Medication Sig Dispense Refill  . albuterol (PROAIR HFA) 108 (90 Base) MCG/ACT inhaler Inhale 2 puffs into the lungs every 6 (six) hours as needed for wheezing or shortness of breath.    [name]. albuterol (PROVENTIL) (2.5 MG/3ML) 0.083% nebulizer solution Take 3 mLs (2.5 mg total) by nebulization every 6 (six) hours as needed for wheezing or shortness of breath. 75 mL 12  . atorvastatin (LIPITOR) 10 MG tablet Take 10 mg by mouth daily.    . cetirizine (ZYRTEC) 10 MG tablet Take 10 mg by mouth daily.    [name]. diltiazem (CARTIA XT) 120 MG 24 hr capsule Take 120 mg by mouth daily.    . Ferrous Sulfate (IRON) 90 (18 Fe) MG TABS Take 1 tablet by mouth 2 (two) times daily with a meal. 60 each 0  . furosemide (LASIX) 20 MG tablet Take 1 tablet (20 mg total) by mouth daily. 90 tablet 3  . mometasone-formoterol (DULERA) 200-5 MCG/ACT AERO Inhale 2 puffs into the lungs 2 (two) times daily. Take 2 puffs in the lungs first thing  in am and then another 2 puffs in lungs about 12 hours later.    . Tiotropium Bromide Monohydrate (SPIRIVA RESPIMAT) 2.5 MCG/ACT AERS Inhale 2 puffs into the lungs daily. 3 Inhaler 3   No facility-administered medications prior to visit.     Allergies:   Morphine and related   Social History   Social History  . Marital Status: Married    Spouse Name: N/A  . Number of Children: 2  . Years of Education: COLLEGE   Occupational History  . pest control    Social History Main Topics  . Smoking status: Former Smoker -- 1.50 packs/day for 45 years    Types: Cigarettes    Quit date: 11/19/2015  . Smokeless tobacco: Never Used      Comment: pt quit smoking!! 11/19/15  . Alcohol Use: No  . Drug Use: No  . Sexual Activity: Not Asked   Other Topics Concern  . None   Social History Narrative     Family History:  The patient's family history includes Alcoholism in his sister; Diabetes in his mother; Drug abuse in his sister; Kidney failure in his father; Prostate cancer in his father.   ROS:   Please see the history of present illness.    ROS All other systems reviewed and are negative.   Physical Exam:    VS:  BP 136/76 mmHg  Pulse 72  Ht  (1.753 m)  Wt 210 lb (95.255 kg)  BMI 31.00 kg/m2   GEN: Well nourished, well developed, in no acute distress HEENT: normal Neck: no JVD, no masses Cardiac: Normal S1/S2, RRR; no murmurs, rubs, or gallops, no edema   Respiratory:  Decreased breath sounds bilaterally; no wheezing, rhonchi or rales GI: soft, nontender, nondistended MS: no deformity or atrophy Skin: warm and dry Neuro: No focal deficits  Psych: Alert and oriented x 3, normal affect  Wt Readings from Last 3 Encounters:  01/18/16 210 lb (95.255 kg)  01/06/16 210 lb 15.7 oz (95.7 kg)  01/05/16 210 lb 1.9 oz (95.31 kg)      Studies/Labs Reviewed:     EKG:  EKG is  ordered today.  The ekg ordered today demonstrates NSR, HR 70, Normal axis, QTc 470 ms no changes  Recent Labs: 01/04/2016: Pro B Natriuretic peptide (BNP) 55.0 01/05/2016: ALT 13* 01/10/2016: BUN 9; Creatinine, Ser 1.17; Hemoglobin 9.1*; Platelets 434*; Potassium 3.7; Sodium 140   Recent Lipid Panel    Component Value Date/Time   CHOL 111 01/11/2015 1248   TRIG 43.0 01/11/2015 1248   HDL 34.20* 01/11/2015 1248   CHOLHDL 3 01/11/2015 1248   VLDL 8.6 01/11/2015 1248   LDLCALC 68 01/11/2015 1248    Additional studies/ records that were reviewed today include:    Dg Chest 2 View  01/04/2016  CLINICAL DATA:  Chest tightness, intermittent shortness of breath for 2-3 months, former smoking history EXAM: CHEST  2 VIEW COMPARISON:   Chest x-ray of 11/04/2015 FINDINGS: The lungs are clear but somewhat hyperaerated. Mediastinal and hilar contours are unremarkable. There is some peribronchial thickening present. The heart is within upper limits of normal. No bony abnormality is seen. IMPRESSION: 1. No change in hyper aeration and peribronchial thickening. 2. No active infiltrate or effusion. Electronically Signed   By: Dwyane Dee M.D.   On: 01/04/2016 11:44   Ct Abdomen Pelvis W Contrast  01/06/2016  CLINICAL DATA:  Anemia EXAM: CT ABDOMEN AND PELVIS WITH CONTRAST TECHNIQUE: Multidetector CT imaging of the abdomen  and pelvis was performed using the standard protocol following bolus administration of intravenous contrast. CONTRAST:  100 mL ISOVUE-300 IOPAMIDOL (ISOVUE-300) INJECTION 61% COMPARISON:  11/11/2013 FINDINGS: Lung bases are free of acute infiltrate or sizable effusion. The liver, spleen and adrenal glands are within normal limits. The pancreas shows diffuse calcifications and atrophy in its tail consistent with chronic pancreatitis. The gallbladder is well distended with multiple dependent stones. The kidneys are well visualized bilaterally and demonstrate a normal enhancement and excretion pattern. A few scattered small hypodensities are noted within the left kidney likely representing cysts. There are too small for adequate characterization however. Fecal material is noted throughout the colon. No focal mass lesion is seen. The appendix is within normal limits. Mild aortoiliac calcifications are seen without aneurysmal dilatation. The bladder is well distended. No pelvic mass lesion is seen. The bony structures show prior fusion of the pubic symphysis. Degenerative changes of the sacroiliac joints are seen as well. No acute bony abnormality is noted. IMPRESSION: Progressive chronic pancreatitis in the tail. Cholelithiasis stable from the previous exam. No acute abnormality noted. Electronically Signed   By: Alcide Clever M.D.   On:  01/06/2016 19:56   Dg Colon Ramond Craver Hi Den Cm  01/10/2016  CLINICAL DATA:  65 year old with anemia and history of failed colonoscopy. EXAM: AIR CONTRAST BARIUM ENEMA TECHNIQUE: Initial scout AP supine abdominal image obtained to insure adequate colon cleansing. Barium was introduced into the colon in a retrograde fashion and refluxed from the rectum to the distal transverse colon. As much of the barium as possible was then removed through the indwelling tube via gravity drain. Air was then insufflated into the colon. Spot images of the colon followed by overhead radiographs were obtained. FLUOROSCOPY TIME:  Radiation Exposure Index (as provided by the fluoroscopic device): 2520 uGy*m2 If the device does not provide the exposure index: Fluoroscopy Time:  1 minute and 24 seconds Number of Acquired Images: 1 scout image. Eleven overhead radiographs. COMPARISON:  Barium enema 05/03/2004. Abdominal pelvic CT 01/06/2016. FINDINGS: The scout abdominal radiograph demonstrates a normal bowel gas pattern. There are calcifications within the pancreatic tail as demonstrated on recent CT. Postsurgical changes are present along the symphysis pubis. Contrast was refluxed through the colon which is mildly tortuous. There are scattered diverticula. There is filling of the cecum, terminal ileum and appendix. No evidence of mass, stricture or ulceration. IMPRESSION: No cause for anemia identified. No evidence of polyp, mass or stricture. Mild diverticulosis. Electronically Signed   By: Carey Bullocks M.D.   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 Wall motion is good. It is possible that this represents diaphragmatic attenuation. However I cannot rule out the possibility of mild scar with very slight ischemia in the inferior wall. This is a low risk scan. LV Ejection Fraction: 55%. LV  Wall Motion: There are no obvious significant wall motion abnormalities.   Holter 2/15 PAF  Echo 12/14 EF 60-65%, no RWMA, mild AI, septal lipomatous hypertrophy  Carotid US 10/14 No ICA stenosis   ASSESSMENT:     No diagnosis found.  PLAN:     1. Severe symptomatic microcytic anemia  - Source of bleeding not identified yet, on iron supplements, to be followed by GI for consideration of capsule endoscopy. - Xarelto discontinued not to be restarted at this  point.  2. PAF - Maintaining NSR.  CHADS2-VASc=3.  Considering profound anemia we will hold Xarelto.   3. Chest pain, atypical, with artifact on stress test in April 2016. He is currently symptomatic and no further ischemic workup necessary at this point.  4. HTN - BP controlled.  5. HL - Continue statin   6. LE edema - resolved, continue furosemide 20 mg daily patient is advised about obtaining a scale and monitoring and adjusting his Lasix hisweight.  7. 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  Follow up in 6 weeks.  Lars Masson, MD  01/18/2016 9:29 AM    [facility] [address] [place], [place], [place]  [address] Phone: [phone]; Fax: [phone]

## 2016-01-19 ENCOUNTER — Telehealth: Payer: Self-pay | Admitting: Acute Care

## 2016-01-19 DIAGNOSIS — Z87891 Personal history of nicotine dependence: Secondary | ICD-10-CM

## 2016-01-23 ENCOUNTER — Other Ambulatory Visit: Payer: Self-pay

## 2016-01-23 DIAGNOSIS — R0602 Shortness of breath: Secondary | ICD-10-CM

## 2016-01-23 MED ORDER — FUROSEMIDE 20 MG PO TABS: 20.0000 mg | ORAL_TABLET | Freq: Every day | ORAL | Status: DC

## 2016-01-26 ENCOUNTER — Ambulatory Visit: Payer: BLUE CROSS/BLUE SHIELD | Admitting: Pulmonary Disease

## 2016-01-27 ENCOUNTER — Ambulatory Visit (INDEPENDENT_AMBULATORY_CARE_PROVIDER_SITE_OTHER): Payer: BLUE CROSS/BLUE SHIELD | Admitting: Pulmonary Disease

## 2016-01-27 ENCOUNTER — Encounter: Payer: Self-pay | Admitting: Pulmonary Disease

## 2016-01-27 VITALS — BP 132/80 | HR 83 | Ht 69.0 in | Wt 204.0 lb

## 2016-01-27 DIAGNOSIS — Z720 Tobacco use: Secondary | ICD-10-CM

## 2016-01-27 DIAGNOSIS — D509 Iron deficiency anemia, unspecified: Secondary | ICD-10-CM | POA: Diagnosis not present

## 2016-01-27 DIAGNOSIS — J449 Chronic obstructive pulmonary disease, unspecified: Secondary | ICD-10-CM

## 2016-01-27 DIAGNOSIS — F17210 Nicotine dependence, cigarettes, uncomplicated: Secondary | ICD-10-CM

## 2016-01-27 NOTE — Assessment & Plan Note (Signed)
Continue follow up with GI

## 2016-01-27 NOTE — Progress Notes (Signed)
Subjective:    Patient ID: Nicholas Camacho, male    DOB: 11/02/1950, 65 y.o.   MRN: [id number]   Synopsis: Former patient of Dr. Shelle Ironlance has COPD.  He quit smoking in 11/2015. PFT's 10/2013:  FEV1 2.38 (72%), ratio 58, no BD response, TLC normal, DLCO 72%  HPI Chief Complaint  Patient presents with  . Follow-up    pt doing well-hospitalized earlier this month for anemia/weakness.     Nicholas Camacho says he hasn't felt this good in months.  He has no dyspnea.    He was hospitalized for a GI bleed and had an endoscopy, see my summary below.  He has seen GI since then and they are planning on observing him alone.  Xarelto is still on hold as of now.  Currently no cough, wheeze.  He is still using his nebulizer twice a day.    Past Medical History  Diagnosis Date  . Elevated cholesterol   . Disturbance of skin sensation 06/24/2013  . Sleep disturbance, unspecified 06/24/2013  . History of echocardiogram     Echo 4/17: EF 60-65%, no RWMA, Gr 2 DD      Review of Systems  Constitutional: Negative for fever, chills and fatigue.  HENT: Negative for rhinorrhea, sinus pressure and sneezing.   Respiratory: Negative for cough, shortness of breath and wheezing.   Cardiovascular: Negative for chest pain, palpitations and leg swelling.       Objective:   Physical Exam Filed Vitals:   01/27/16 1627  BP: 132/80  Pulse: 83  Height: 5\' 9"  (1.753 m)  Weight: 204 lb (92.534 kg)  SpO2: 94%   RA  Gen: well appearing HENT: OP clear, , neck supple PULM: Wheezing throughout both lungs bilaterally with prolonged expiratory phase CV: RRR, no mgr, trace edema GI: BS+, soft, nontender Derm: no cyanosis or rash Psyche: normal mood and affect   Hospital records from April 2017 reviewed where he had severe iron deficiency anemia, he required 4 units of blood transfusion. An EGD showed esophagitis. Xarelto was held. Plans were made for capsule endoscopy  .    Assessment & Plan:  COPD GOLD  II Despite his exacerbations earlier this year, he has improved and this has been a relatively stable interval for him.  I think that this anemia magnified the severity of his symptoms.  Plan: Continue Dulera and Spiriva Given his moderate airflow obstruction, considering stopping Spiriva or consolidating to Stiolot or Anoro in 6 months if everything is stable  Cigarette smoker He quit smoking 11/2015.  I congratulated him.   I also recommended that he have a screening CT scan for lung cancer.  We will refer him to the lung cancer screening program.  Microcytic hypochromic anemia Continue follow up with GI.     Current outpatient prescriptions:  .  albuterol (PROAIR HFA) 108 (90 Base) MCG/ACT inhaler, Inhale 2 puffs into the lungs every 6 (six) hours as needed for wheezing or shortness of breath., Disp: , Rfl:  .  albuterol (PROVENTIL) (2.5 MG/3ML) 0.083% nebulizer solution, Take 3 mLs (2.5 mg total) by nebulization every 6 (six) hours as needed for wheezing or shortness of breath., Disp: 75 mL, Rfl: 12 .  atorvastatin (LIPITOR) 10 MG tablet, Take 10 mg by mouth daily., Disp: , Rfl:  .  cetirizine (ZYRTEC) 10 MG tablet, Take 10 mg by mouth daily., Disp: , Rfl:  .  diltiazem (CARTIA XT) 120 MG 24 hr capsule, Take 120 mg by mouth daily., Disp: ,  Rfl:  .  Ferrous Sulfate (IRON) 90 (18 Fe) MG TABS, Take 1 tablet by mouth 2 (two) times daily with a meal. (Patient taking differently: Take 325 mg by mouth 2 (two) times daily with a meal. ), Disp: 60 each, Rfl: 0 .  furosemide (LASIX) 20 MG tablet, Take 1 tablet (20 mg total) by mouth daily., Disp: 90 tablet, Rfl: 0 .  mometasone-formoterol (DULERA) 200-5 MCG/ACT AERO, Inhale 2 puffs into the lungs 2 (two) times daily. Take 2 puffs in the lungs first thing in am and then another 2 puffs in lungs about 12 hours later., Disp: , Rfl:  .  Tiotropium Bromide Monohydrate (SPIRIVA RESPIMAT) 2.5 MCG/ACT AERS, Inhale 2 puffs into the lungs daily., Disp: 3  Inhaler, Rfl: 3

## 2016-01-27 NOTE — Patient Instructions (Signed)
We will make sure you have a low lung cancer screening CT this year Keep taking your medications as you're doing Stay away from cigarettes I'll see you back in 6 months

## 2016-01-27 NOTE — Assessment & Plan Note (Signed)
Despite his exacerbations earlier this year, he has improved and this has been a relatively stable interval for him.  I think that this anemia magnified the severity of his symptoms.  Plan: Continue Dulera and Spiriva Given his moderate airflow obstruction, considering stopping Spiriva or consolidating to Stiolot or Anoro in 6 months if everything is stable

## 2016-01-27 NOTE — Assessment & Plan Note (Signed)
He quit smoking 11/2015.  I congratulated him.   I also recommended that he have a screening CT scan for lung cancer.  We will refer him to the lung cancer screening program.

## 2016-02-01 ENCOUNTER — Other Ambulatory Visit: Payer: Self-pay | Admitting: Pulmonary Disease

## 2016-02-01 DIAGNOSIS — J449 Chronic obstructive pulmonary disease, unspecified: Secondary | ICD-10-CM

## 2016-02-01 NOTE — Telephone Encounter (Signed)
Called spoke with pt. Reviewed the lung cancer program and pt does qualify. I scheduled him for a SDMV on 5/10 with SG. CT order has been placed. Pt voiced understanding and had no further questions. Nothing further needed.

## 2016-02-08 ENCOUNTER — Ambulatory Visit (INDEPENDENT_AMBULATORY_CARE_PROVIDER_SITE_OTHER)
Admission: RE | Admit: 2016-02-08 | Discharge: 2016-02-08 | Disposition: A | Discharge status: Home / Self Care | Payer: BLUE CROSS/BLUE SHIELD | Source: Ambulatory Visit | Attending: Acute Care | Admitting: Acute Care

## 2016-02-08 ENCOUNTER — Ambulatory Visit (INDEPENDENT_AMBULATORY_CARE_PROVIDER_SITE_OTHER): Payer: BLUE CROSS/BLUE SHIELD | Admitting: Acute Care

## 2016-02-08 DIAGNOSIS — Z87891 Personal history of nicotine dependence: Secondary | ICD-10-CM

## 2016-02-09 ENCOUNTER — Encounter: Payer: Self-pay | Admitting: Acute Care

## 2016-02-09 NOTE — Progress Notes (Signed)
Shared Decision Making Visit Lung Cancer Screening Program [phone]([id number])   Eligibility:  Age 65 y.o.  Pack Years Smoking History Calculation: 67-pack-year smoking history (# packs/per year x # years smoked)  Recent History of coughing up blood  no  Unexplained weight loss? no ( >Than 15 pounds within the last 6 months )  Prior History Lung / other cancer no (Diagnosis within the last 5 years already requiring surveillance chest CT Scans).  Smoking Status Former Smoker  Former Smokers: Years since quit: < 1 year  Quit Date:11/2015  Visit Components:  Discussion included one or more decision making aids. yes  Discussion included risk/benefits of screening. yes  Discussion included potential follow up diagnostic testing for abnormal scans. yes  Discussion included meaning and risk of over diagnosis. yes  Discussion included meaning and risk of False Positives. yes  Discussion included meaning of total radiation exposure. yes  Counseling Included:  Importance of adherence to annual lung cancer LDCT screening. yes  Impact of comorbidities on ability to participate in the program. yes  Ability and willingness to under diagnostic treatment. yes  Smoking Cessation Counseling:  Current Smokers:   Discussed importance of smoking cessation. NA; Former smoker  Information about tobacco cessation classes and interventions provided to patient. yes  Patient provided with "ticket" for LDCT Scan. yes  Symptomatic Patient. no  Counseling  Diagnosis Code: Tobacco Use Z72.0  Asymptomatic Patient yes  Counseling : Not applicable former smoker  Former Smokers:   Discussed the importance of maintaining cigarette abstinence. yes  Diagnosis Code: Personal History of Nicotine Dependence. U04.540Z87.891  Information about tobacco cessation classes and interventions provided to patient. Yes  Patient provided with "ticket" for LDCT Scan. yes  Written Order for Lung Cancer Screening  with LDCT placed in Epic. Yes (CT Chest Lung Cancer Screening Low Dose W/O CM) [id number] Z12.2-Screening of respiratory organs Z87.891-Personal history of nicotine dependence   I spent 20 minutes of face to face time with Nicholas Camacho and Nicholas Camacho discussing the risks and benefits of lung cancer screening. We viewed a power point together that explained in detail the above noted topics. We took the time to pause the power point at intervals to allow for questions to be asked and answered to ensure understanding. We discussed that he had taken the single most powerful action possible to decrease Nicholas risk of developing lung cancer when he quit smoking. I counseled him to remain smoke free, and to contact me if he ever had the desire to smoke again so that I can provide resources and tools to help support the effort to remain smoke free. We discussed the time and location of the scan, and that either Jerolyn Shinammy Wilson, CMA or I will call with the results within  24-48 hours of receiving them. Nicholas Camacho has my card and contact information in the event he needs to speak with me, in addition to a copy of the power point we reviewed as a resource. He verbalized understanding of all of the above and had no further questions upon leaving the office.   Bevelyn NgoSarah F Barrett Goldie, NP  02/09/2016

## 2016-02-10 ENCOUNTER — Telehealth: Payer: Self-pay | Admitting: Acute Care

## 2016-02-10 NOTE — Telephone Encounter (Signed)
I called the results of [name] low-dose CT screening. I explained that the scan was read as a lung RADS 2 nodules that are benign in appearance and behavior. I explained the recommendation per radiology was for follow-up in 12 months with annual screening. I spoke to 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 for his follow-up scan in May 2018. She verbalized understanding and had no further questions. She states she will give the results to her husband [name]. Both [name] and his wife have my contact information in the event they have further questions in the future.

## 2016-02-16 ENCOUNTER — Telehealth: Payer: Self-pay | Admitting: Pulmonary Disease

## 2016-02-16 NOTE — Telephone Encounter (Signed)
Spoke with pt and gave him the contact number for the COPD study. [phone])

## 2016-02-17 ENCOUNTER — Other Ambulatory Visit: Payer: Self-pay | Admitting: Cardiology

## 2016-02-29 ENCOUNTER — Encounter: Payer: Self-pay | Admitting: Cardiology

## 2016-02-29 ENCOUNTER — Ambulatory Visit (INDEPENDENT_AMBULATORY_CARE_PROVIDER_SITE_OTHER): Payer: BLUE CROSS/BLUE SHIELD | Admitting: Cardiology

## 2016-02-29 VITALS — BP 154/100 | HR 60 | Ht 69.0 in | Wt 209.8 lb

## 2016-02-29 DIAGNOSIS — I10 Essential (primary) hypertension: Secondary | ICD-10-CM

## 2016-02-29 DIAGNOSIS — R600 Localized edema: Secondary | ICD-10-CM

## 2016-02-29 DIAGNOSIS — E785 Hyperlipidemia, unspecified: Secondary | ICD-10-CM

## 2016-02-29 DIAGNOSIS — Z0100 Encounter for examination of eyes and vision without abnormal findings: Secondary | ICD-10-CM

## 2016-02-29 DIAGNOSIS — D509 Iron deficiency anemia, unspecified: Secondary | ICD-10-CM

## 2016-02-29 DIAGNOSIS — I480 Paroxysmal atrial fibrillation: Secondary | ICD-10-CM

## 2016-02-29 DIAGNOSIS — I2510 Atherosclerotic heart disease of native coronary artery without angina pectoris: Secondary | ICD-10-CM

## 2016-02-29 DIAGNOSIS — Z79899 Other long term (current) drug therapy: Secondary | ICD-10-CM

## 2016-02-29 MED ORDER — APIXABAN 5 MG PO TABS: 5.0000 mg | ORAL_TABLET | Freq: Two times a day (BID) | ORAL | Status: DC

## 2016-02-29 MED ORDER — LOSARTAN POTASSIUM 25 MG PO TABS: 25.0000 mg | ORAL_TABLET | Freq: Every day | ORAL | Status: DC

## 2016-02-29 NOTE — Progress Notes (Signed)
Patient ID: MILIND RAETHER, male   DOB: 1951-05-19, 65 y.o.   MRN: [id number]    Cardiology Office Note:    Date:  02/29/2016   ID:  [name], DOB 1951-05-25, MRN [id number]  PCP:  Farris Has, MD  Cardiologist:  Dr. Tobias Alexander   Electrophysiologist:  n/a  Referring MD: Farris Has, MD   No chief complaint on file.   History of Present Illness:     RAIDYN WASSINK is a 65 y.o. male with a hx of HTN, HL, COPD, PAF, OSA, small AAA and iliac aneurysm. CHADS2-VASc=3 (HTN, prior TIA).  He is on Xarelto for anticoagulation. Last seen by Dr. Delton See 4/16. He noted significant dyspnea on exertion.  Nuclear stress test was felt to be low risk.   Recently seen in pulmonology in follow-up. He complained of lower extremity edema and was placed on Lasix. He was just seen again yesterday with continued LE edema. Dyspnea was also worse. He also admitted to chest discomfort. He is added on for further evaluation.  The patient was seen by Tereso Newcomer on April 6 and complained of profound fatigue and dyspnea on exertion. His CBC showed hemoglobin of 5.2 and he was admitted to [facility]. He received 4 units of blood his Xarelto was discontinued he had very low iron levels and received IV iron. GI performed upper EGD that showed esophagitis and colonoscopy didn't reveal anything. He supposed to follow with his GI doctor tomorrow to determine if he needs capsule endoscopy. His most recent hemoglobin was 9.1 on April 11 when he was discharged from the hospital. He feels significantly better which much more energy less shortness of breath and no chest pain. He denies any palpitations or syncope.  02/29/2016 - 6 weeks follow-up, the patient feels significantly better, he follows with GI he is EGD refill esophagitis and was treated with course of PPIs that are now discontinued. He was started on iron tear PT and his hemoglobin is now 13.1. Because of significant improvement GI  decided not to proceed with capsule endoscopy. Patient is still not taking anticoagulation. He overall feels significantly better with no shortness of breath and no chest pain, no recent palpitations no syncope no lower extremity edema orthopnea or paroxysmal nocturnal dyspnea. He continues to smoke.   Past Medical History  Diagnosis Date  . Elevated cholesterol   . Disturbance of skin sensation 06/24/2013  . Sleep disturbance, unspecified 06/24/2013  . History of echocardiogram     Echo 4/17: EF 60-65%, no RWMA, Gr 2 DD    Past Surgical History  Procedure Laterality Date  . Fractured pelvis    . Reconstruct left heel  2002  . Tonsillectomy    . Inguinal hernia repair Bilateral   . Esophagogastroduodenoscopy N/A 01/08/2016    Procedure: ESOPHAGOGASTRODUODENOSCOPY (EGD);  Surgeon: Carman Ching, MD;  Location: [facility] ENDOSCOPY;  Service: Endoscopy;  Laterality: N/A;    Current Medications: Outpatient Prescriptions Prior to Visit  Medication Sig Dispense Refill  . albuterol (PROAIR HFA) 108 (90 Base) MCG/ACT inhaler Inhale 2 puffs into the lungs every 6 (six) hours as needed for wheezing or shortness of breath.    [name] albuterol (PROVENTIL) (2.5 MG/3ML) 0.083% nebulizer solution Take 3 mLs (2.5 mg total) by nebulization every 6 (six) hours as needed for wheezing or shortness of breath. 75 mL 12  . atorvastatin (LIPITOR) 10 MG tablet Take 10 mg by mouth daily.    [name] CARTIA XT 120 MG 24 hr  capsule TAKE 1 CAPSULE DAILY (Patient taking differently: TAKE 1 CAPSULE BY MOUTH DAILY) 90 capsule 3  . cetirizine (ZYRTEC) 10 MG tablet Take 10 mg by mouth daily.    . Ferrous Sulfate (IRON) 90 (18 Fe) MG TABS Take 1 tablet by mouth 2 (two) times daily with a meal. (Patient taking differently: Take 325 mg by mouth 2 (two) times daily with a meal. ) 60 each 0  . furosemide (LASIX) 20 MG tablet Take 1 tablet (20 mg total) by mouth daily. 90 tablet 0  . mometasone-formoterol (DULERA) 200-5 MCG/ACT AERO Inhale 2  puffs into the lungs 2 (two) times daily. Take 2 puffs in the lungs first thing in am and then another 2 puffs in lungs about 12 hours later.    . Tiotropium Bromide Monohydrate (SPIRIVA RESPIMAT) 2.5 MCG/ACT AERS Inhale 2 puffs into the lungs daily. 3 Inhaler 3   No facility-administered medications prior to visit.     Allergies:   Morphine and related   Social History   Social History  . Marital Status: Married    Spouse Name: N/A  . Number of Children: 2  . Years of Education: COLLEGE   Occupational History  . pest control    Social History Main Topics  . Smoking status: Former Smoker -- 1.50 packs/day for 45 years    Types: Cigarettes    Quit date: 11/19/2015  . Smokeless tobacco: Never Used     Comment: pt quit smoking!! 11/19/15  . Alcohol Use: No  . Drug Use: No  . Sexual Activity: Not Asked   Other Topics Concern  . None   Social History Narrative     Family History:  The patient's family history includes Alcoholism in his sister; Diabetes in his mother; Drug abuse in his sister; Kidney failure in his father; Prostate cancer in his father.   ROS:   Please see the history of present illness.    ROS All other systems reviewed and are negative.   Physical Exam:    VS:  BP 154/100 mmHg  Pulse 60  Ht 5\' 9"  (1.753 m)  Wt 209 lb 12.8 oz (95.165 kg)  BMI 30.97 kg/m2   GEN: Well nourished, well developed, in no acute distress HEENT: normal Neck: no JVD, no masses Cardiac: Normal S1/S2, RRR; no murmurs, rubs, or gallops, no edema   Respiratory:  Decreased breath sounds bilaterally; no wheezing, rhonchi or rales GI: soft, nontender, nondistended MS: no deformity or atrophy Skin: warm and dry Neuro: No focal deficits  Psych: Alert and oriented x 3, normal affect  Wt Readings from Last 3 Encounters:  02/29/16 209 lb 12.8 oz (95.165 kg)  01/27/16 204 lb (92.534 kg)  01/18/16 210 lb (95.255 kg)      Studies/Labs Reviewed:     EKG:  EKG is  ordered today.   The ekg ordered today demonstrates NSR, HR 70, Normal axis, QTc 470 ms no changes  Recent Labs: 12/22/2015: Brain Natriuretic Peptide 54.0 01/04/2016: Pro B Natriuretic peptide (BNP) 55.0 01/05/2016: ALT 13* 01/10/2016: BUN 9; Creatinine, Ser 1.17; Hemoglobin 9.1*; Platelets 434*; Potassium 3.7; Sodium 140   Recent Lipid Panel    Component Value Date/Time   CHOL 111 01/11/2015 1248   TRIG 43.0 01/11/2015 1248   HDL 34.20* 01/11/2015 1248   CHOLHDL 3 01/11/2015 1248   VLDL 8.6 01/11/2015 1248   LDLCALC 68 01/11/2015 1248    Additional studies/ records that were reviewed today include:    Ct Chest  Lung Ca Screen Low Dose W/o Cm  02/09/2016  CLINICAL DATA:  65 year old male former smoker, quit 3 months ago, with 67 pack-year history of smoking, for initial lung cancer screening EXAM: CT CHEST WITHOUT CONTRAST LOW-DOSE FOR LUNG CANCER SCREENING TECHNIQUE: Multidetector CT imaging of the chest was performed following the standard protocol without IV contrast. COMPARISON:  None. FINDINGS: Mediastinum/Nodes: The heart is normal in size. No pericardial effusion. Three vessel coronary atherosclerosis. Mild atherosclerotic calcifications of the aortic arch. Small mediastinal lymph nodes which do not meet pathologic CT size criteria. Visualized thyroid is unremarkable. Lungs/Pleura: Mild biapical pleural-parenchymal scarring, right greater than left. Mild emphysematous changes. Associated bronchial wall thickening in the right lower lobe. Mild bibasilar scarring/atelectasis, right greater than left. Three right lung nodules, including a calcified granuloma in the anterior right upper lobe and a dominant flat 5.9 mm (volumetric mean) noncalcified nodule along the right minor fissure (series 2/ image 161). Upper abdomen: Visualized upper abdomen is notable for pancreatic atrophy with parenchymal calcifications, reflecting sequela of prior/chronic pancreatitis. Musculoskeletal: Mild bilateral gynecomastia.  Degenerative changes of the visualized thoracolumbar spine. IMPRESSION: 5.9 mm nodule along the right minor fissure. Lung-RADS Category 2, benign appearance or behavior. Continue annual screening with low-dose chest CT without contrast in 12 months. Electronically Signed   By: Charline BillsSriyesh  Krishnan M.D.   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 Wall motion is good. It is possible that this represents diaphragmatic attenuation. However I cannot rule out the possibility of mild scar with very slight ischemia in the inferior wall. This is a low risk scan. LV Ejection Fraction: 55%. LV Wall Motion: There are no obvious significant wall motion abnormalities.   Holter 2/15 PAF  Echo 12/14 EF 60-65%, no RWMA, mild AI, septal lipomatous hypertrophy  Carotid US 10/14 No ICA stenosis   ASSESSMENT:     No diagnosis found.    PLAN:     1. Severe symptomatic microcytic anemia  - Source of bleeding not identified, however complete recovery hemoglobin 13.1, continues to take iron supplements. -  Xarelto was held until now and no aspirin either.  - We will start Eliquis 5 mg by mouth twice a day today and check CBC in one month.  2. PAF - Maintaining NSR.  CHADS2-VASc=3.  Considering profound anemia we will hold Xarelto.  We will start Eliquis 5 mg by mouth twice a day today and check CBC in one month.  3. Chest pain, atypical, with artifact on stress test in April 2016. He is currently symptomatic and no further ischemic workup necessary at this point.  4. HTN - uncontrolled, start losartan 25 mg by mouth daily check creatinine in one month.  5. HL - Continue statin   6. LE edema - resolved, continue furosemide 20 mg daily PRN.  7. 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  Follow up in 3  months. CMP, CBC in one month.  Tobias AlexanderKatarina Marzelle Rutten, MD  02/29/2016 10:45 AM    [facility] [address], [place], [place]  [address] Phone: [phone]; Fax: [phone]

## 2016-02-29 NOTE — Patient Instructions (Signed)
Medication Instructions:   START TAKING LOSARTAN 25 MG ONCE DAILY  START TAKING ELIQUIS 5 MG TWICE DAILY    Labwork:  IN 1 MONTH TO CHECK A CMET AND CBC W DIFF     Follow-Up:  3 MONTHS WITH DR Delton SeeNELSON       If you need a refill on your cardiac medications before your next appointment, please call your pharmacy.

## 2016-03-02 ENCOUNTER — Other Ambulatory Visit: Payer: Self-pay | Admitting: Cardiology

## 2016-03-02 DIAGNOSIS — D509 Iron deficiency anemia, unspecified: Secondary | ICD-10-CM

## 2016-03-02 DIAGNOSIS — I10 Essential (primary) hypertension: Secondary | ICD-10-CM

## 2016-03-02 DIAGNOSIS — I480 Paroxysmal atrial fibrillation: Secondary | ICD-10-CM

## 2016-03-02 DIAGNOSIS — E785 Hyperlipidemia, unspecified: Secondary | ICD-10-CM

## 2016-03-02 MED ORDER — LOSARTAN POTASSIUM 25 MG PO TABS: 25.0000 mg | ORAL_TABLET | Freq: Every day | ORAL | Status: DC

## 2016-03-30 ENCOUNTER — Other Ambulatory Visit (INDEPENDENT_AMBULATORY_CARE_PROVIDER_SITE_OTHER): Payer: BLUE CROSS/BLUE SHIELD | Admitting: *Deleted

## 2016-03-30 DIAGNOSIS — E785 Hyperlipidemia, unspecified: Secondary | ICD-10-CM

## 2016-03-30 DIAGNOSIS — I480 Paroxysmal atrial fibrillation: Secondary | ICD-10-CM

## 2016-03-30 DIAGNOSIS — R0602 Shortness of breath: Secondary | ICD-10-CM

## 2016-03-30 DIAGNOSIS — I10 Essential (primary) hypertension: Secondary | ICD-10-CM

## 2016-03-30 DIAGNOSIS — D509 Iron deficiency anemia, unspecified: Secondary | ICD-10-CM

## 2016-03-30 LAB — COMPREHENSIVE METABOLIC PANEL
ALT: 12 U/L (ref 9–46)
AST: 14 U/L (ref 10–35)
Albumin: 4.2 g/dL (ref 3.6–5.1)
Alkaline Phosphatase: 80 U/L (ref 40–115)
BUN: 17 mg/dL (ref 7–25)
CO2: 23 mmol/L (ref 20–31)
Calcium: 9.7 mg/dL (ref 8.6–10.3)
Chloride: 105 mmol/L (ref 98–110)
Creat: 1.2 mg/dL (ref 0.70–1.25)
Glucose, Bld: 85 mg/dL (ref 65–99)
Potassium: 4.2 mmol/L (ref 3.5–5.3)
Sodium: 139 mmol/L (ref 135–146)
Total Bilirubin: 0.3 mg/dL (ref 0.2–1.2)
Total Protein: 6.7 g/dL (ref 6.1–8.1)

## 2016-03-30 NOTE — Addendum Note (Signed)
Addended by: Tonita PhoenixBOWDEN, Trevante Tennell K on: 03/30/2016 09:55 AM   Modules accepted: Orders

## 2016-03-31 LAB — CBC WITH DIFFERENTIAL/PLATELET
Basophils Absolute: 0 cells/uL (ref 0–200)
Basophils Relative: 0 %
Eosinophils Absolute: 328 cells/uL (ref 15–500)
Eosinophils Relative: 4 %
HCT: 40.3 % (ref 38.5–50.0)
Hemoglobin: 13.6 g/dL (ref 13.2–17.1)
Lymphocytes Relative: 19 %
Lymphs Abs: 1558 cells/uL (ref 850–3900)
MCH: 28.4 pg (ref 27.0–33.0)
MCHC: 33.7 g/dL (ref 32.0–36.0)
MCV: 84.1 fL (ref 80.0–100.0)
MPV: 10.1 fL (ref 7.5–12.5)
Monocytes Absolute: 984 cells/uL — ABNORMAL HIGH (ref 200–950)
Monocytes Relative: 12 %
Neutro Abs: 5330 cells/uL (ref 1500–7800)
Neutrophils Relative %: 65 %
Platelets: 260 10*3/uL (ref 140–400)
RBC: 4.79 MIL/uL (ref 4.20–5.80)
RDW: 16.1 % — ABNORMAL HIGH (ref 11.0–15.0)
WBC: 8.2 10*3/uL (ref 3.8–10.8)

## 2016-04-05 ENCOUNTER — Other Ambulatory Visit: Payer: Self-pay | Admitting: Cardiology

## 2016-04-06 ENCOUNTER — Telehealth: Payer: Self-pay | Admitting: Cardiology

## 2016-04-06 NOTE — Telephone Encounter (Signed)
Informed the pt that his labs were reviewed as a preliminary report and Dr Delton SeeNelson will give her final interpretation on his labs, once she returns back to the office.  Informed the pt that Dr Delton SeeNelson is on vacation this week, but his labs were already observed by a triage nurse for any critical values, which he did not have.  Informed the pt that once Dr Delton SeeNelson results his labs, I will follow-up with him thereafter to endorse this.  Pt verbalized understanding and agrees with this plan.

## 2016-04-06 NOTE — Telephone Encounter (Signed)
New Message  Pt call requesting to speak with RN about result lab results. Please call back to discuss

## 2016-04-11 ENCOUNTER — Other Ambulatory Visit: Payer: Self-pay

## 2016-04-11 DIAGNOSIS — E785 Hyperlipidemia, unspecified: Secondary | ICD-10-CM

## 2016-04-11 DIAGNOSIS — D509 Iron deficiency anemia, unspecified: Secondary | ICD-10-CM

## 2016-04-11 DIAGNOSIS — I480 Paroxysmal atrial fibrillation: Secondary | ICD-10-CM

## 2016-04-11 DIAGNOSIS — I10 Essential (primary) hypertension: Secondary | ICD-10-CM

## 2016-04-11 MED ORDER — APIXABAN 5 MG PO TABS: 5.0000 mg | ORAL_TABLET | Freq: Two times a day (BID) | ORAL | Status: DC

## 2016-04-20 ENCOUNTER — Telehealth: Payer: Self-pay | Admitting: *Deleted

## 2016-04-20 NOTE — Telephone Encounter (Signed)
I spoke with Nicholas Camacho,he stated we was going to wait until he see's Dr.Nelson before scheduling his myoview.

## 2016-04-27 ENCOUNTER — Other Ambulatory Visit: Payer: Self-pay | Admitting: Cardiology

## 2016-04-27 MED ORDER — ALBUTEROL SULFATE HFA 108 (90 BASE) MCG/ACT IN AERS: 2.0000 | INHALATION_SPRAY | Freq: Four times a day (QID) | RESPIRATORY_TRACT | 2 refills | Status: DC | PRN

## 2016-04-27 NOTE — Telephone Encounter (Signed)
Yes please refill this med accordingly, for Dr Delton See has filled this in the past.   Thanks!

## 2016-04-27 NOTE — Telephone Encounter (Signed)
Pt calling requesting a refill on ProAir inhaler. Please advise

## 2016-05-18 ENCOUNTER — Encounter: Payer: Self-pay | Admitting: Cardiology

## 2016-06-01 ENCOUNTER — Encounter: Payer: Self-pay | Admitting: Cardiology

## 2016-06-01 ENCOUNTER — Ambulatory Visit (INDEPENDENT_AMBULATORY_CARE_PROVIDER_SITE_OTHER): Payer: BLUE CROSS/BLUE SHIELD | Admitting: Cardiology

## 2016-06-01 ENCOUNTER — Encounter (INDEPENDENT_AMBULATORY_CARE_PROVIDER_SITE_OTHER): Payer: Self-pay

## 2016-06-01 VITALS — BP 138/72 | HR 74 | Ht 69.0 in | Wt 211.0 lb

## 2016-06-01 DIAGNOSIS — I10 Essential (primary) hypertension: Secondary | ICD-10-CM

## 2016-06-01 DIAGNOSIS — I480 Paroxysmal atrial fibrillation: Secondary | ICD-10-CM | POA: Diagnosis not present

## 2016-06-01 DIAGNOSIS — Z7901 Long term (current) use of anticoagulants: Secondary | ICD-10-CM

## 2016-06-01 DIAGNOSIS — R600 Localized edema: Secondary | ICD-10-CM

## 2016-06-01 DIAGNOSIS — I2510 Atherosclerotic heart disease of native coronary artery without angina pectoris: Secondary | ICD-10-CM | POA: Diagnosis not present

## 2016-06-01 DIAGNOSIS — E785 Hyperlipidemia, unspecified: Secondary | ICD-10-CM

## 2016-06-01 DIAGNOSIS — I5033 Acute on chronic diastolic (congestive) heart failure: Secondary | ICD-10-CM

## 2016-06-01 NOTE — Progress Notes (Signed)
Patient ID: Nicholas NakayamaCharles M Camacho, male   DOB: 02/13/1951, 65 y.o.   MRN: [id number]    Cardiology Office Note:    Date:  06/01/2016   ID:  Nicholas Nakayamaharles M Camacho, DOB 01/27/1951, MRN [id number]  PCP:  [name], [name], MD  Cardiologist:  Dr. Tobias AlexanderKatarina Jalee Saine   Electrophysiologist:  n/a  Referring MD: [name], [name], MD   No chief complaint on file.   History of Present Illness:     Nicholas Camacho is a 65 y.o. male with a hx of HTN, HL, COPD, PAF, OSA, small AAA and iliac aneurysm. CHADS2-VASc=3 (HTN, prior TIA).  He is on Xarelto for anticoagulation. Last seen by [name] 4/16. He noted significant dyspnea on exertion.  Nuclear stress test was felt to be low risk.   Recently seen in pulmonology in follow-up. He complained of lower extremity edema and was placed on Lasix. He was just seen again yesterday with continued LE edema. Dyspnea was also worse. He also admitted to chest discomfort. He is added on for further evaluation.  The patient was seen by [name] on April 6 and complained of profound fatigue and dyspnea on exertion. His CBC showed hemoglobin of 5.2 and [facility]. He received 4 units of blood his Xarelto was discontinued he had very low iron levels and received IV iron. GI performed upper EGD that showed esophagitis and colonoscopy didn't reveal anything. He supposed to follow with his GI doctor tomorrow to determine if he needs capsule endoscopy. His most recent hemoglobin was 9.1 on April 11 when he was discharged from the hospital. He feels significantly better which much more energy less shortness of breath and no chest pain. He denies any palpitations or syncope.  02/29/2016 - 6 weeks follow-up, the patient feels significantly better, he follows with GI he is EGD refill esophagitis and was treated with course of PPIs that are now discontinued. He was started on iron tear PT and his hemoglobin is now 13.1. Because of significant improvement GI decided  not to proceed with capsule endoscopy. Patient is still not taking anticoagulation. He overall feels significantly better with no shortness of breath and no chest pain, no recent palpitations no syncope no lower extremity edema orthopnea or paroxysmal nocturnal dyspnea. He continues to smoke.  06/01/2016 - 3 months follow up, feels great, works full time, restarted Eliquis, much better tolerated than Xarelto, minimal bleeding with cuts at work, stable Hb 13.6. No LE edema, no chest pain or SOB.    Past Medical History:  Diagnosis Date  . Disturbance of skin sensation 06/24/2013  . Elevated cholesterol   . History of echocardiogram    Echo 4/17: EF 60-65%, no RWMA, Gr 2 DD  . Sleep disturbance, unspecified 06/24/2013    Past Surgical History:  Procedure Laterality Date  . ESOPHAGOGASTRODUODENOSCOPY N/A 01/08/2016   Procedure: ESOPHAGOGASTRODUODENOSCOPY (EGD);  Surgeon: [name], MD;  [facility];  Service: Endoscopy;  Laterality: N/A;  . fractured pelvis    . INGUINAL HERNIA REPAIR Bilateral   . reconstruct left heel  2002  . TONSILLECTOMY      Current Medications: Outpatient Medications Prior to Visit  Medication Sig Dispense Refill  . albuterol (PROAIR HFA) 108 (90 Base) MCG/ACT inhaler Inhale 2 puffs into the lungs every 6 (six) hours as needed for wheezing or shortness of breath. 1 Inhaler 2  . albuterol (PROVENTIL) (2.5 MG/3ML) 0.083% nebulizer solution Take 3 mLs (2.5 mg total) by nebulization every 6 (  six) hours as needed for wheezing or shortness of breath. 75 mL 12  . apixaban (ELIQUIS) 5 MG TABS tablet Take 1 tablet (5 mg total) by mouth 2 (two) times daily. 180 tablet 3  . atorvastatin (LIPITOR) 10 MG tablet Take 10 mg by mouth daily.    . cetirizine (ZYRTEC) 10 MG tablet Take 10 mg by mouth daily.    [name] losartan (COZAAR) 25 MG tablet Take 1 tablet (25 mg total) by mouth daily. 90 tablet 3  . mometasone-formoterol (DULERA) 200-5 MCG/ACT AERO Inhale 2 puffs into the  lungs 2 (two) times daily. Take 2 puffs in the lungs first thing in am and then another 2 puffs in lungs about 12 hours later.    . Tiotropium Bromide Monohydrate (SPIRIVA RESPIMAT) 2.5 MCG/ACT AERS Inhale 2 puffs into the lungs daily. 3 Inhaler 3  . CARTIA XT 120 MG 24 hr capsule TAKE 1 CAPSULE DAILY (Patient taking differently: TAKE 1 CAPSULE BY MOUTH DAILY) 90 capsule 3  . Ferrous Sulfate (IRON) 90 (18 Fe) MG TABS Take 1 tablet by mouth 2 (two) times daily with a meal. (Patient not taking: Reported on 06/01/2016) 60 each 0  . furosemide (LASIX) 20 MG tablet Take 1 tablet (20 mg total) by mouth daily. (Patient not taking: Reported on 06/01/2016) 90 tablet 3   No facility-administered medications prior to visit.      Allergies:   Morphine and related   Social History   Social History  . Marital status: Married    Spouse name: N/A  . Number of children: 2  . Years of education: COLLEGE   Occupational History  . pest control    Social History Main Topics  . Smoking status: Former Smoker    Packs/day: 1.50    Years: 45.00    Types: Cigarettes    Quit date: 11/19/2015  . Smokeless tobacco: Never Used     Comment: pt quit smoking!! 11/19/15  . Alcohol use No  . Drug use: No  . Sexual activity: Not Asked   Other Topics Concern  . None   Social History Narrative  . None     Family History:  The patient's family history includes Alcoholism in his sister; Diabetes in his mother; Drug abuse in his sister; Kidney failure in his father; Prostate cancer in his father.   ROS:   Please see the history of present illness.    ROS All other systems reviewed and are negative.   Physical Exam:    VS:  BP 138/72   Pulse 74   Ht 5\' 9"  (1.753 m)   Wt 211 lb (95.7 kg)   BMI 31.16 kg/m    GEN: Well nourished, well developed, in no acute distress  HEENT: normal  Neck: no JVD, no masses Cardiac: Normal S1/S2, RRR; no murmurs, rubs, or gallops, no edema   Respiratory:  Decreased breath  sounds bilaterally; no wheezing, rhonchi or rales GI: soft, nontender, nondistended MS: no deformity or atrophy  Skin: warm and dry Neuro: No focal deficits  Psych: Alert and oriented x 3, normal affect  Wt Readings from Last 3 Encounters:  06/01/16 211 lb (95.7 kg)  02/29/16 209 lb 12.8 oz (95.2 kg)  01/27/16 204 lb (92.5 kg)      Studies/Labs Reviewed:     EKG:  EKG is  ordered today.  The ekg ordered today demonstrates NSR, HR 70, Normal axis, QTc 470 ms no changes  Recent Labs: 12/22/2015: Brain Natriuretic Peptide 54.0  01/04/2016: Pro B Natriuretic peptide (BNP) 55.0 03/30/2016: ALT 12; BUN 17; Creat 1.20; Hemoglobin 13.6; Platelets 260; Potassium 4.2; Sodium 139   Recent Lipid Panel    Component Value Date/Time   CHOL 111 01/11/2015 1248   TRIG 43.0 01/11/2015 1248   HDL 34.20 (L) 01/11/2015 1248   CHOLHDL 3 01/11/2015 1248   VLDL 8.6 01/11/2015 1248   LDLCALC 68 01/11/2015 1248    Additional studies/ records that were reviewed today include:    No results found.  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 Wall motion is good. It is possible that this represents diaphragmatic attenuation. However I cannot rule out the possibility of mild scar with very slight ischemia in the inferior wall. This is a low risk scan. LV Ejection Fraction: 55%. LV Wall Motion: There are no obvious significant wall motion abnormalities.   Holter 2/15 PAF  Echo 12/14 EF 60-65%, no RWMA, mild AI, septal lipomatous hypertrophy  Carotid US 10/14 No ICA stenosis   ASSESSMENT:     1. Paroxysmal atrial fibrillation (HCC)   2. Hyperlipidemia   3. Essential hypertension       PLAN:     1. Severe symptomatic microcytic anemia  - Source of bleeding not identified, however complete recovery hemoglobin 13.6, continues to take iron supplements. -  Restarted Eliquis, much  better tolerated then Xarelto, stable CBC  2. PAF - Maintaining NSR.  CHADS2-VASc=3. On Eliquis.  3. Chest pain, atypical, with artifact on stress test in April 2016. He is currently symptomatic and no further ischemic workup necessary at this point.  4. HTN - controlle after starting Losartan, Crea stable at 1.1.  5. HL - Continue statin, tolerated  6. LE edema - resolved, continue furosemide 20 mg daily PRN.  7. 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  Follow up in 6 months. CMP, CBC, lipids prior to the visit   Tobias Alexander, MD  06/01/2016 8:13 PM    [facility] [address] [place], [place], [place]  [address] Phone: [phone]; Fax: [phone]

## 2016-06-01 NOTE — Patient Instructions (Signed)
Medication Instructions:   Your physician recommends that you continue on your current medications as directed. Please refer to the Current Medication list given to you today.    Labwork:  PRIOR TOO YOUR 6 MONTH FOLLOW-UP APPOINTMENT WITH DR Delton SeeNELSON TO CHECK---CBC W DIFF, CMET, AND TSH    Follow-Up:  Your physician wants you to follow-up in: 6 MONTHS WITH DR Johnell ComingsNELSON You will receive a reminder letter in the mail two months in advance. If you don't receive a letter, please call our office to schedule the follow-up appointment.  PLEASE HAVE YOUR LABS DONE PRIOR TO THIS OFFICE VISIT        If you need a refill on your cardiac medications before your next appointment, please call your pharmacy.

## 2016-06-04 ENCOUNTER — Other Ambulatory Visit: Payer: Self-pay | Admitting: Pulmonary Disease

## 2016-07-22 ENCOUNTER — Other Ambulatory Visit: Payer: Self-pay | Admitting: Cardiology

## 2016-07-23 NOTE — Telephone Encounter (Signed)
Yes please refill accordingly.

## 2016-08-02 ENCOUNTER — Ambulatory Visit (INDEPENDENT_AMBULATORY_CARE_PROVIDER_SITE_OTHER)
Admission: RE | Admit: 2016-08-02 | Discharge: 2016-08-02 | Disposition: A | Discharge status: Home / Self Care | Payer: BLUE CROSS/BLUE SHIELD | Source: Ambulatory Visit | Attending: Pulmonary Disease | Admitting: Pulmonary Disease

## 2016-08-02 ENCOUNTER — Ambulatory Visit (INDEPENDENT_AMBULATORY_CARE_PROVIDER_SITE_OTHER): Payer: BLUE CROSS/BLUE SHIELD | Admitting: Pulmonary Disease

## 2016-08-02 ENCOUNTER — Encounter: Payer: Self-pay | Admitting: Pulmonary Disease

## 2016-08-02 ENCOUNTER — Other Ambulatory Visit (INDEPENDENT_AMBULATORY_CARE_PROVIDER_SITE_OTHER): Payer: BLUE CROSS/BLUE SHIELD

## 2016-08-02 VITALS — BP 130/74 | HR 72 | Ht 69.0 in | Wt 211.0 lb

## 2016-08-02 DIAGNOSIS — G609 Hereditary and idiopathic neuropathy, unspecified: Secondary | ICD-10-CM

## 2016-08-02 DIAGNOSIS — R0602 Shortness of breath: Secondary | ICD-10-CM | POA: Diagnosis not present

## 2016-08-02 LAB — VITAMIN B12: VITAMIN B 12: 374 pg/mL (ref 211–911)

## 2016-08-02 LAB — TSH: TSH: 2.62 u[IU]/mL (ref 0.35–4.50)

## 2016-08-02 LAB — HEMOGLOBIN A1C: Hgb A1c MFr Bld: 6.1 % (ref 4.6–6.5)

## 2016-08-02 MED ORDER — DOXYCYCLINE HYCLATE 100 MG PO TABS: 100.0000 mg | ORAL_TABLET | Freq: Two times a day (BID) | ORAL | 0 refills | Status: AC

## 2016-08-02 MED ORDER — PREDNISONE 20 MG PO TABS: 20.0000 mg | ORAL_TABLET | Freq: Every day | ORAL | 0 refills | Status: DC

## 2016-08-02 NOTE — Patient Instructions (Signed)
For the COPD exacerbation we will have you take prednisone 20 mg daily for 5 days as well as doxycycline 100 mg twice a day for 5 days You need to let us know if your shortness of breath does not improve within 1 week Use Mucinex twice a day  For the hand tingling we will collect blood today to test for thyroid abnormalities, vitamin B12 abnormalities, and blood sugar abnormalities. If these tests are normal and you want to see your primary care physician to consider imaging of your cervical spine.  We will see you back in 3 months or sooner if needed

## 2016-08-02 NOTE — Progress Notes (Signed)
Subjective:    Patient ID: Nicholas Camacho, male    DOB: 08/21/1951, 65 y.o.   MMelrose NakayamaN: [id number]   Synopsis: Former patient of Dr. Shelle Ironlance has COPD.  He quit smoking in 11/2015. PFT's 10/2013:  FEV1 2.38 (72%), ratio 58, no BD response, TLC normal, DLCO 72%  HPI Chief Complaint  Patient presents with  . Follow-up    pt c/o increased PND, sob with exertion, prod cough with green mucus X3 weeks.      Nicholas Camacho will cough frequently when he has a "tickle" in his throat.  This has been happening more frequently lately.  He describes three weeks of dyspnea which is associated with "breathing heavy" when he climbs stairs.  He says that he thinks that he had a sick contact around the end of September.  For three weeks or so he has been experiencing more chest congestion and mucus production over this time.  He has been trying to avoid sick folks at work but it is hard to void them altogether. He says he has a deep cough with a lot of congestion and drainage.  Zyrtec and flonase aren't helpful with his sinus congestion which has been worse during this time as well.    He has used his albuterol several times in the last few weeks, now daily which is much more than normal for him.  No chest pain, but he has some tightness in his chest which is infrequently.  He has some mild "heaviness" in his ankles, but no frank swelling.  No bleeding lately.  He has changed to Eliquis from Xarelto because of this.    He remains abstinent from cigarettes.  He still takes the [place] and Spiriva.    He has also been experiencing tingling in his hands for the last several weeks. He says he doesn't note numbness but just tingling. He'll occur sporadically in both hands in the palms. Will occur sometimes in the right while not in the last or vice versa. No weakness.  Past Medical History:  Diagnosis Date  . Disturbance of skin sensation 06/24/2013  . Elevated cholesterol   . History of echocardiogram    Echo 4/17: EF  60-65%, no RWMA, Gr 2 DD  . Sleep disturbance, unspecified 06/24/2013      Review of Systems  Constitutional: Negative for chills, fatigue and fever.  HENT: Negative for rhinorrhea, sinus pressure and sneezing.   Respiratory: Negative for cough, shortness of breath and wheezing.   Cardiovascular: Negative for chest pain, palpitations and leg swelling.       Objective:   Physical Exam Vitals:   08/02/16 1543  BP: 130/74  Pulse: 72  SpO2: 93%  Weight: 211 lb (95.7 kg)  Height: 5\' 9"  (1.753 m)   RA  Gen: well appearing HENT: OP clear, , neck supple PULM: Wheezing throughout both lungs bilaterally with prolonged expiratory phase CV: RRR, no mgr, trace edema GI: BS+, soft, nontender Derm: no cyanosis or rash Psyche: normal mood and affect   Records from cardiology reviewed were he was managed for his A. fib  CBC    Component Value Date/Time   WBC 8.2 03/30/2016 0951   RBC 4.79 03/30/2016 0951   HGB 13.6 03/30/2016 0951   HCT 40.3 03/30/2016 0951   PLT 260 03/30/2016 0951   MCV 84.1 03/30/2016 0951   MCH 28.4 03/30/2016 0951   MCHC 33.7 03/30/2016 0951   RDW 16.1 (H) 03/30/2016 0951   LYMPHSABS 1,558 03/30/2016 40980951  MONOABS 984 (H) 03/30/2016 0951   EOSABS 328 03/30/2016 0951   BASOSABS 0 03/30/2016 0951    .    Assessment & Plan:  Impression: COPD exacerbation Peripheral neuropathy hands Allergic rhinitis  Discussion: He's having an exacerbation of his chronic obstructive pulmonary disease. The tingling in his hands is somewhat unexplained. While this could be related to cervical spine disease metabolic causes that can lead to this condition include hypothyroidism, less likely a small vessel vasculitis, or vitamin deficiency or hyperglycemia.  Plan: For the COPD exacerbation we will have you take prednisone 20 mg daily for 5 days as well as doxycycline 100 mg twice a day for 5 days You need to let us know if your shortness of breath does not improve  within 1 week Use Mucinex twice a day  For the hand tingling we will collect blood today to test for thyroid abnormalities, vitamin B12 abnormalities, and blood sugar abnormalities. If these tests are normal and you want to see your primary care physician to consider imaging of your cervical spine.     Current Outpatient Prescriptions:  .  albuterol (PROVENTIL) (2.5 MG/3ML) 0.083% nebulizer solution, Take 3 mLs (2.5 mg total) by nebulization every 6 (six) hours as needed for wheezing or shortness of breath., Disp: 75 mL, Rfl: 12 .  apixaban (ELIQUIS) 5 MG TABS tablet, Take 1 tablet (5 mg total) by mouth 2 (two) times daily., Disp: 180 tablet, Rfl: 3 .  atorvastatin (LIPITOR) 10 MG tablet, Take 10 mg by mouth daily., Disp: , Rfl:  .  CARTIA XT 120 MG 24 hr capsule, Take 120 mg by mouth daily., Disp: , Rfl:  .  cetirizine (ZYRTEC) 10 MG tablet, Take 10 mg by mouth daily., Disp: , Rfl:  .  losartan (COZAAR) 25 MG tablet, Take 1 tablet (25 mg total) by mouth daily., Disp: 90 tablet, Rfl: 3 .  mometasone-formoterol (DULERA) 200-5 MCG/ACT AERO, Inhale 2 puffs into the lungs 2 (two) times daily. Take 2 puffs in the lungs first thing in am and then another 2 puffs in lungs about 12 hours later., Disp: , Rfl:  .  omeprazole (PRILOSEC) 20 MG capsule, Take 20 mg by mouth daily as needed. , Disp: , Rfl: 12 .  PROAIR HFA 108 (90 Base) MCG/ACT inhaler, USE 2 INHALATIONS EVERY 6 HOURS AS NEEDED FOR WHEEZING OR SHORTNESS OF BREATH, Disp: 8.5 g, Rfl: 2 .  SPIRIVA RESPIMAT 2.5 MCG/ACT AERS, USE 2 INHALATIONS DAILY, Disp: 12 g, Rfl: 3

## 2016-08-03 LAB — FOLATE RBC: RBC FOLATE: 697 ng/mL (ref >280)

## 2016-08-07 ENCOUNTER — Telehealth: Payer: Self-pay | Admitting: Pulmonary Disease

## 2016-08-07 NOTE — Telephone Encounter (Signed)
Notes Recorded by Lupita Leashouglas B [name], MD on 08/06/2016 at 2:37 PM EST A, Please let the patient know this just showed COPD Thanks, B  Lupita Leashouglas B [name], MD  Velvet BatheAshley L Caulfield, CMA        A,  Please let the patient know this was OK  Thanks,  B    Pt aware of results. Pt voiced understanding & had no further questions. Nothing further needed.

## 2016-08-26 ENCOUNTER — Other Ambulatory Visit: Payer: Self-pay | Admitting: Pulmonary Disease

## 2016-09-10 ENCOUNTER — Telehealth: Payer: Self-pay | Admitting: Pulmonary Disease

## 2016-09-10 NOTE — Telephone Encounter (Signed)
FYI, [facility] now [facility]. No longer with [facility] . His chart has been updated to reflect this change.   [facility] SleightDulera is requiring a PA - pt states that he has about 7 months worth the the [facility] so this is not an urgent thing.   Will hold in triage to follow up in AM

## 2016-09-21 NOTE — Telephone Encounter (Signed)
Pharmacy will have to contact us for the PA when this medication is needed again.  Will sign off of this message at this time.

## 2016-10-30 ENCOUNTER — Other Ambulatory Visit: Payer: Self-pay | Admitting: *Deleted

## 2016-10-30 ENCOUNTER — Other Ambulatory Visit: Payer: Self-pay

## 2016-10-30 DIAGNOSIS — I480 Paroxysmal atrial fibrillation: Secondary | ICD-10-CM

## 2016-10-30 DIAGNOSIS — D509 Iron deficiency anemia, unspecified: Secondary | ICD-10-CM

## 2016-10-30 DIAGNOSIS — E785 Hyperlipidemia, unspecified: Secondary | ICD-10-CM

## 2016-10-30 DIAGNOSIS — I10 Essential (primary) hypertension: Secondary | ICD-10-CM

## 2016-10-30 MED ORDER — ALBUTEROL SULFATE (2.5 MG/3ML) 0.083% IN NEBU: INHALATION_SOLUTION | RESPIRATORY_TRACT | 3 refills | Status: DC

## 2016-10-30 MED ORDER — TIOTROPIUM BROMIDE MONOHYDRATE 2.5 MCG/ACT IN AERS: INHALATION_SPRAY | RESPIRATORY_TRACT | 3 refills | Status: DC

## 2016-10-30 MED ORDER — ALBUTEROL SULFATE HFA 108 (90 BASE) MCG/ACT IN AERS: INHALATION_SPRAY | RESPIRATORY_TRACT | 2 refills | Status: DC

## 2016-10-30 MED ORDER — LOSARTAN POTASSIUM 25 MG PO TABS: 25.0000 mg | ORAL_TABLET | Freq: Every day | ORAL | 1 refills | Status: DC

## 2016-10-30 MED ORDER — APIXABAN 5 MG PO TABS: 5.0000 mg | ORAL_TABLET | Freq: Two times a day (BID) | ORAL | 1 refills | Status: DC

## 2016-10-30 MED ORDER — CARTIA XT 120 MG PO CP24: 120.0000 mg | ORAL_CAPSULE | Freq: Every day | ORAL | 1 refills | Status: DC

## 2016-10-30 NOTE — Telephone Encounter (Signed)
Refill Open    [facility]/2016 [facility] [address] [facility]  Michial Disney J [place], [place]   Paroxysmal atrial fibrillation (HCC) +3 more  Dx   Orlene Ermonversation  (Newest Message First)  Loa Socksvy M Martin, LPN  to Me       10/30/16 8:47 AM  refill  Lajoyce CornersIvy Sheron NightingaleM Martin, LPN      1/61/091/30/18 8:46 AM  Note    Yes, please refill. Thanks!     Me  to Loa Socksvy M Martin, LPN       6/04/541/30/18 8:44 AM  Asking for a refill on this, please advise, thanks  This encounter is not signed. The conversation may still be ongoing.

## 2016-10-30 NOTE — Telephone Encounter (Signed)
Yes, please refill. Thanks!

## 2016-11-02 ENCOUNTER — Ambulatory Visit (INDEPENDENT_AMBULATORY_CARE_PROVIDER_SITE_OTHER): Payer: BLUE CROSS/BLUE SHIELD | Admitting: Pulmonary Disease

## 2016-11-02 ENCOUNTER — Encounter: Payer: Self-pay | Admitting: Pulmonary Disease

## 2016-11-02 ENCOUNTER — Other Ambulatory Visit (INDEPENDENT_AMBULATORY_CARE_PROVIDER_SITE_OTHER): Payer: BLUE CROSS/BLUE SHIELD

## 2016-11-02 ENCOUNTER — Ambulatory Visit: Payer: BLUE CROSS/BLUE SHIELD | Admitting: Internal Medicine

## 2016-11-02 VITALS — BP 134/76 | HR 89 | Ht 69.0 in | Wt 209.0 lb

## 2016-11-02 DIAGNOSIS — F17210 Nicotine dependence, cigarettes, uncomplicated: Secondary | ICD-10-CM

## 2016-11-02 DIAGNOSIS — Z23 Encounter for immunization: Secondary | ICD-10-CM | POA: Diagnosis not present

## 2016-11-02 DIAGNOSIS — J449 Chronic obstructive pulmonary disease, unspecified: Secondary | ICD-10-CM

## 2016-11-02 DIAGNOSIS — D649 Anemia, unspecified: Secondary | ICD-10-CM | POA: Diagnosis not present

## 2016-11-02 LAB — CBC WITH DIFFERENTIAL/PLATELET
BASOS ABS: 0 10*3/uL (ref 0.0–0.1)
Basophils Relative: 0.4 % (ref 0.0–3.0)
EOS ABS: 0.2 10*3/uL (ref 0.0–0.7)
Eosinophils Relative: 2.8 % (ref 0.0–5.0)
HEMATOCRIT: 43.7 % (ref 39.0–52.0)
Hemoglobin: 14.5 g/dL (ref 13.0–17.0)
LYMPHS PCT: 14.9 % (ref 12.0–46.0)
Lymphs Abs: 1.2 10*3/uL (ref 0.7–4.0)
MCHC: 33.1 g/dL (ref 30.0–36.0)
MCV: 87.9 fl (ref 78.0–100.0)
MONOS PCT: 9.1 % (ref 3.0–12.0)
Monocytes Absolute: 0.7 10*3/uL (ref 0.1–1.0)
Neutro Abs: 5.9 10*3/uL (ref 1.4–7.7)
Neutrophils Relative %: 72.8 % (ref 43.0–77.0)
PLATELETS: 252 10*3/uL (ref 150.0–400.0)
RBC: 4.97 Mil/uL (ref 4.22–5.81)
RDW: 14.5 % (ref 11.5–15.5)
WBC: 8 10*3/uL (ref 4.0–10.5)

## 2016-11-02 MED ORDER — FLUTICASONE FUROATE-VILANTEROL 100-25 MCG/INH IN AEPB: 1.0000 | INHALATION_SPRAY | Freq: Every day | RESPIRATORY_TRACT | 0 refills | Status: DC

## 2016-11-02 NOTE — Assessment & Plan Note (Signed)
No signs of bleeding since the last visit but given his dyspnea will check a CBC to ensure no anemia on Eliquis.

## 2016-11-02 NOTE — Assessment & Plan Note (Signed)
This has been a stable interval for him since his last exacerbation in November. His insurance has changed and no longer covers Dulera.  Plan: Flu shot today Breo instead of Dulera Spiriva Follow-up 6 months

## 2016-11-02 NOTE — Patient Instructions (Signed)
We will call you with the results of your blood test  We will change Dulera 2 Breo 1 puff daily Keep taking Spiriva  We will see you back in 6 months or sooner if needed

## 2016-11-02 NOTE — Addendum Note (Signed)
Addended by: Velvet BatheAULFIELD, ASHLEY L on: 11/02/2016 09:48 AM   Modules accepted: Orders

## 2016-11-02 NOTE — Progress Notes (Signed)
Subjective:    Patient ID: Nicholas Camacho, male    DOB: 09/07/1951, 66 y.o.   MRN: [id number]   Synopsis: Former patient of Dr. Shelle Ironlance has COPD.  He quit smoking in 11/2015. PFT's 10/2013:  FEV1 2.38 (72%), ratio 58, no BD response, TLC normal, DLCO 72%  HPI Chief Complaint  Patient presents with  . Follow-up    pt c/o stable wheezing, prod cough with green mucus worse qam.    Nicholas Camacho says that he has been feeling better.  He says that he still has the urge to cough from time to time. His breathing feels "pressed" in that he feels worn out.  He has a hard time climbing a flight of stairs but in general he can do his job.  He has been wearing a mask without difficulty.  He is taking Spiriva and Dulera.  He says that Elwin SleightDulera is no longer covered.  He says that Breo, Advair, and Symbicort are covered.    He has not noticed any bleeding since the last visit.    He remains cigarette free.    Past Medical History:  Diagnosis Date  . Disturbance of skin sensation 06/24/2013  . Elevated cholesterol   . History of echocardiogram    Echo 4/17: EF 60-65%, no RWMA, Gr 2 DD  . Sleep disturbance, unspecified 06/24/2013      Review of Systems  Constitutional: Negative for chills, fatigue and fever.  HENT: Negative for rhinorrhea, sinus pressure and sneezing.   Respiratory: Negative for cough, shortness of breath and wheezing.   Cardiovascular: Negative for chest pain, palpitations and leg swelling.       Objective:   Physical Exam Vitals:   11/02/16 0857  BP: 134/76  Pulse: 89  SpO2: 95%  Weight: 209 lb (94.8 kg)  Height: 5\' 9"  (1.753 m)   RA  Gen: well appearing HENT: OP clear, TM's clear, neck supple PULM: CTA B, normal percussion CV: RRR, no mgr, trace edema GI: BS+, soft, nontender Derm: no cyanosis or rash Psyche: normal mood and affect   CBC    Component Value Date/Time   WBC 8.2 03/30/2016 0951   RBC 4.79 03/30/2016 0951   HGB 13.6 03/30/2016 0951   HCT 40.3  03/30/2016 0951   PLT 260 03/30/2016 0951   MCV 84.1 03/30/2016 0951   MCH 28.4 03/30/2016 0951   MCHC 33.7 03/30/2016 0951   RDW 16.1 (H) 03/30/2016 0951   LYMPHSABS 1,558 03/30/2016 0951   MONOABS 984 (H) 03/30/2016 0951   EOSABS 328 03/30/2016 0951   BASOSABS 0 03/30/2016 0951    .    Assessment & Plan:   Cigarette smoker He quit one year ago and remains abstinent. He will continue in the lung cancer screening program.  COPD GOLD II This has been a stable interval for him since his last exacerbation in November. His insurance has changed and no longer covers Dulera.  Plan: Flu shot today Breo instead of Dulera Spiriva Follow-up 6 months  Symptomatic anemia No signs of bleeding since the last visit but given his dyspnea will check a CBC to ensure no anemia on Eliquis.    Current Outpatient Prescriptions:  .  albuterol (PROAIR HFA) 108 (90 Base) MCG/ACT inhaler, USE 2 INHALATIONS EVERY 6 HOURS AS NEEDED FOR WHEEZING OR SHORTNESS OF BREATH, Disp: 8.5 g, Rfl: 2 .  albuterol (PROVENTIL) (2.5 MG/3ML) 0.083% nebulizer solution, USE 1 VIAL WITH NEBULIZER EVERY 6 HOURS AS NEEDED WHEEZING OR SHORTNESS OF  BREATH, Disp: 1080 mL, Rfl: 3 .  apixaban (ELIQUIS) 5 MG TABS tablet, Take 1 tablet (5 mg total) by mouth 2 (two) times daily., Disp: 180 tablet, Rfl: 1 .  atorvastatin (LIPITOR) 10 MG tablet, Take 10 mg by mouth daily., Disp: , Rfl:  .  CARTIA XT 120 MG 24 hr capsule, Take 1 capsule (120 mg total) by mouth daily., Disp: 90 capsule, Rfl: 1 .  losartan (COZAAR) 25 MG tablet, Take 1 tablet (25 mg total) by mouth daily., Disp: 90 tablet, Rfl: 1 .  mometasone-formoterol (DULERA) 200-5 MCG/ACT AERO, Inhale 2 puffs into the lungs 2 (two) times daily. Take 2 puffs in the lungs first thing in am and then another 2 puffs in lungs about 12 hours later., Disp: , Rfl:  .  omeprazole (PRILOSEC) 20 MG capsule, Take 20 mg by mouth daily as needed. , Disp: , Rfl: 12 .  Tiotropium Bromide  Monohydrate (SPIRIVA RESPIMAT) 2.5 MCG/ACT AERS, USE 2 INHALATIONS DAILY, Disp: 12 g, Rfl: 3 .  fluticasone furoate-vilanterol (BREO ELLIPTA) 100-25 MCG/INH AEPB, Inhale 1 puff into the lungs daily., Disp: 1 each, Rfl: 0

## 2016-11-02 NOTE — Assessment & Plan Note (Signed)
He quit one year ago and remains abstinent. He will continue in the lung cancer screening program.

## 2016-11-06 ENCOUNTER — Telehealth: Payer: Self-pay | Admitting: Pulmonary Disease

## 2016-11-06 NOTE — Telephone Encounter (Signed)
Results have been explained to patient, pt expressed understanding. Nothing further needed.  Notes Recorded by Lupita Leashouglas B [name], MD on 11/05/2016 at 8:08 PM EST A, Please let the patient know this was OK Thanks, B

## 2016-11-16 ENCOUNTER — Encounter: Payer: Self-pay | Admitting: Cardiology

## 2016-11-26 ENCOUNTER — Other Ambulatory Visit: Payer: BLUE CROSS/BLUE SHIELD | Admitting: *Deleted

## 2016-11-26 DIAGNOSIS — I480 Paroxysmal atrial fibrillation: Secondary | ICD-10-CM

## 2016-11-26 DIAGNOSIS — I10 Essential (primary) hypertension: Secondary | ICD-10-CM

## 2016-11-26 DIAGNOSIS — E785 Hyperlipidemia, unspecified: Secondary | ICD-10-CM

## 2016-11-26 LAB — COMPREHENSIVE METABOLIC PANEL
ALT: 15 IU/L (ref 0–44)
AST: 13 IU/L (ref 0–40)
Albumin/Globulin Ratio: 1.8 (ref 1.2–2.2)
Albumin: 4.3 g/dL (ref 3.6–4.8)
Alkaline Phosphatase: 113 IU/L (ref 39–117)
BUN/Creatinine Ratio: 12 (ref 10–24)
BUN: 12 mg/dL (ref 8–27)
Bilirubin Total: 0.3 mg/dL (ref 0.0–1.2)
CO2: 18 mmol/L (ref 18–29)
Calcium: 9.6 mg/dL (ref 8.6–10.2)
Chloride: 100 mmol/L (ref 96–106)
Creatinine, Ser: 0.97 mg/dL (ref 0.76–1.27)
GFR calc Af Amer: 95 mL/min/{1.73_m2} (ref >59)
GFR calc non Af Amer: 82 mL/min/{1.73_m2} (ref >59)
Globulin, Total: 2.4 g/dL (ref 1.5–4.5)
Glucose: 103 mg/dL — ABNORMAL HIGH (ref 65–99)
Potassium: 4.5 mmol/L (ref 3.5–5.2)
Sodium: 140 mmol/L (ref 134–144)
Total Protein: 6.7 g/dL (ref 6.0–8.5)

## 2016-11-26 LAB — TSH: TSH: 4.05 u[IU]/mL (ref 0.450–4.500)

## 2016-11-26 LAB — CBC WITH DIFFERENTIAL/PLATELET
Basophils Absolute: 0 10*3/uL (ref 0.0–0.2)
Basos: 0 %
EOS (ABSOLUTE): 0.3 10*3/uL (ref 0.0–0.4)
Eos: 3 %
Hematocrit: 44.1 % (ref 37.5–51.0)
Hemoglobin: 14.9 g/dL (ref 13.0–17.7)
Immature Grans (Abs): 0 10*3/uL (ref 0.0–0.1)
Immature Granulocytes: 0 %
Lymphocytes Absolute: 1.4 10*3/uL (ref 0.7–3.1)
Lymphs: 17 %
MCH: 29 pg (ref 26.6–33.0)
MCHC: 33.8 g/dL (ref 31.5–35.7)
MCV: 86 fL (ref 79–97)
Monocytes Absolute: 0.7 10*3/uL (ref 0.1–0.9)
Monocytes: 9 %
Neutrophils Absolute: 5.8 10*3/uL (ref 1.4–7.0)
Neutrophils: 71 %
Platelets: 263 10*3/uL (ref 150–379)
RBC: 5.14 x10E6/uL (ref 4.14–5.80)
RDW: 14.5 % (ref 12.3–15.4)
WBC: 8.2 10*3/uL (ref 3.4–10.8)

## 2016-11-26 NOTE — Addendum Note (Signed)
Addended by: Tonita PhoenixBOWDEN, Setareh Rom K on: 11/26/2016 09:41 AM   Modules accepted: Orders

## 2016-11-28 ENCOUNTER — Ambulatory Visit (INDEPENDENT_AMBULATORY_CARE_PROVIDER_SITE_OTHER): Payer: BLUE CROSS/BLUE SHIELD | Admitting: Cardiology

## 2016-11-28 ENCOUNTER — Encounter: Payer: Self-pay | Admitting: Cardiology

## 2016-11-28 VITALS — BP 142/92 | HR 82 | Ht 69.0 in | Wt 208.0 lb

## 2016-11-28 DIAGNOSIS — E7849 Other hyperlipidemia: Secondary | ICD-10-CM

## 2016-11-28 DIAGNOSIS — E784 Other hyperlipidemia: Secondary | ICD-10-CM | POA: Diagnosis not present

## 2016-11-28 DIAGNOSIS — I10 Essential (primary) hypertension: Secondary | ICD-10-CM | POA: Diagnosis not present

## 2016-11-28 DIAGNOSIS — I480 Paroxysmal atrial fibrillation: Secondary | ICD-10-CM | POA: Diagnosis not present

## 2016-11-28 DIAGNOSIS — I2510 Atherosclerotic heart disease of native coronary artery without angina pectoris: Secondary | ICD-10-CM | POA: Diagnosis not present

## 2016-11-28 DIAGNOSIS — R42 Dizziness and giddiness: Secondary | ICD-10-CM | POA: Diagnosis not present

## 2016-11-28 DIAGNOSIS — Z7901 Long term (current) use of anticoagulants: Secondary | ICD-10-CM

## 2016-11-28 DIAGNOSIS — I714 Abdominal aortic aneurysm, without rupture, unspecified: Secondary | ICD-10-CM

## 2016-11-28 NOTE — Patient Instructions (Addendum)
Medication Instructions:   Your physician recommends that you continue on your current medications as directed. Please refer to the Current Medication list given to you today.    Testing/Procedures:  Your physician has requested that you have a carotid duplex. This test is an ultrasound of the carotid arteries in your neck. It looks at blood flow through these arteries that supply the brain with blood. Allow one hour for this exam. There are no restrictions or special instructions.   Your physician has requested that you have an abdominal aorta duplex. During this test, an ultrasound is used to evaluate the aorta. Allow 30 minutes for this exam. Do not eat after midnight the day before and avoid carbonated beverages      Follow-Up:  Your physician wants you to follow-up in: 6 MONTHS WITH DR Johnell ComingsNELSON You will receive a reminder letter in the mail two months in advance. If you don't receive a letter, please call our office to schedule the follow-up appointment.        If you need a refill on your cardiac medications before your next appointment, please call your pharmacy.

## 2016-11-28 NOTE — Addendum Note (Signed)
Addended by: Loa SocksMARTIN, IVY M on: 11/28/2016 10:21 AM   Modules accepted: Orders

## 2016-11-28 NOTE — Progress Notes (Signed)
Patient ID: Nicholas Camacho, male   DOB: [birth year], 66 y.o.   MRN: [id number]    Cardiology Office Note:    Date:  11/28/2016   ID:  Nicholas Camacho, DOB 24-Apr-1951, MRN [id number]  PCP:  Nicholas Has, MD  Cardiologist:  Dr. Tobias Camacho   Electrophysiologist:  n/a  Referring MD: Nicholas Has, MD   Chief complain: none  History of Present Illness:     Nicholas Camacho is a 66 y.o. male with a hx of HTN, HL, COPD, PAF, OSA, small AAA and iliac aneurysm. CHADS2-VASc=3 (HTN, prior TIA).  He is on Xarelto for anticoagulation. Last seen by Dr. Delton Camacho 4/16. He noted significant dyspnea on exertion.  Nuclear stress test was felt to be low risk.   Recently seen in pulmonology in follow-up. He complained of lower extremity edema and was placed on Lasix. He was just seen again yesterday with continued LE edema. Dyspnea was also worse. He also admitted to chest discomfort. He is added on for further evaluation.  The patient was seen by Tereso Newcomer on April 6 and complained of profound fatigue and dyspnea on exertion. His CBC showed hemoglobin of 5.2 and he was admitted to [facility]. He received 4 units of blood his Xarelto was discontinued he had very low iron levels and received IV iron. GI performed upper EGD that showed esophagitis and colonoscopy didn't reveal anything. He supposed to follow with his GI doctor tomorrow to determine if he needs capsule endoscopy. His most recent hemoglobin was 9.1 on April 11 when he was discharged from the hospital. He feels significantly better which much more energy less shortness of breath and no chest pain. He denies any palpitations or syncope.  02/29/2016 - 6 weeks follow-up, the patient feels significantly better, he follows with GI he is EGD refill esophagitis and was treated with course of PPIs that are now discontinued. He was started on iron tear PT and his hemoglobin is now 13.1. Because of significant improvement GI decided not to  proceed with capsule endoscopy. Patient is still not taking anticoagulation. He overall feels significantly better with no shortness of breath and no chest pain, no recent palpitations no syncope no lower extremity edema orthopnea or paroxysmal nocturnal dyspnea. He continues to smoke.  06/01/2016 - 3 months follow up, feels great, works full time, restarted Eliquis, much better tolerated than Xarelto, minimal bleeding with cuts at work, stable Hb 13.6. No LE edema, no chest pain or SOB.   11/27/2016, 6 months follow-up, patient is feeling great, no bleeding with Eliquis and his anemia Camacho resolved most recent hemoglobin 14.9. He denies any palpitations presyncope or syncope. No chest pain, occasional shortness of breath. It Camacho been 1 year since he quit smoking had good support with pulmonary follow-up. He is experiencing postnasal drip, denies any muscle pain with Lipitor. He Camacho been experiencing dizziness that's not orthostatic but for example while driving, it happens approximately once a months.  Past Medical History:  Diagnosis Date  . Disturbance of skin sensation 06/24/2013  . Elevated cholesterol   . History of echocardiogram    Echo 4/17: EF 60-65%, no RWMA, Gr 2 DD  . Sleep disturbance, unspecified 06/24/2013    Past Surgical History:  Procedure Laterality Date  . ESOPHAGOGASTRODUODENOSCOPY N/A 01/08/2016   Procedure: ESOPHAGOGASTRODUODENOSCOPY (EGD);  Surgeon: Carman Ching, MD;  Location: [facility] ENDOSCOPY;  Service: Endoscopy;  Laterality: N/A;  . fractured pelvis    . INGUINAL HERNIA REPAIR Bilateral   .  reconstruct left heel  2002  . TONSILLECTOMY      Current Medications: Outpatient Medications Prior to Visit  Medication Sig Dispense Refill  . albuterol (PROAIR HFA) 108 (90 Base) MCG/ACT inhaler USE 2 INHALATIONS EVERY 6 HOURS AS NEEDED FOR WHEEZING OR SHORTNESS OF BREATH 8.5 g 2  . albuterol (PROVENTIL) (2.5 MG/3ML) 0.083% nebulizer solution USE 1 VIAL WITH NEBULIZER EVERY 6  HOURS AS NEEDED WHEEZING OR SHORTNESS OF BREATH 1080 mL 3  . apixaban (ELIQUIS) 5 MG TABS tablet Take 1 tablet (5 mg total) by mouth 2 (two) times daily. 180 tablet 1  . atorvastatin (LIPITOR) 10 MG tablet Take 10 mg by mouth daily.    [name]. CARTIA XT 120 MG 24 hr capsule Take 1 capsule (120 mg total) by mouth daily. 90 capsule 1  . losartan (COZAAR) 25 MG tablet Take 1 tablet (25 mg total) by mouth daily. 90 tablet 1  . Tiotropium Bromide Monohydrate (SPIRIVA RESPIMAT) 2.5 MCG/ACT AERS USE 2 INHALATIONS DAILY 12 g 3  . fluticasone furoate-vilanterol (BREO ELLIPTA) 100-25 MCG/INH AEPB Inhale 1 puff into the lungs daily. (Patient not taking: Reported on 11/28/2016) 1 each 0  . omeprazole (PRILOSEC) 20 MG capsule Take 20 mg by mouth daily as needed.   12   No facility-administered medications prior to visit.      Allergies:   Morphine and related   Social History   Social History  . Marital status: Married    Spouse name: N/A  . Number of children: 2  . Years of education: COLLEGE   Occupational History  . pest control    Social History Main Topics  . Smoking status: Former Smoker    Packs/day: 1.50    Years: 45.00    Types: Cigarettes    Quit date: 11/19/2015  . Smokeless tobacco: Never Used     Comment: pt quit smoking!! 11/19/15  . Alcohol use No  . Drug use: No  . Sexual activity: Not Asked   Other Topics Concern  . None   Social History Narrative  . None     Family History:  The patient's family history includes Alcoholism in his sister; Diabetes in his mother; Drug abuse in his sister; Kidney failure in his father; Prostate cancer in his father.   ROS:   Please Camacho the history of present illness.    ROS All other systems reviewed and are negative.   Physical Exam:    VS:  BP (!) 142/92   Pulse 82   Ht 5\' 9"  (1.753 m)   Wt 208 lb (94.3 kg)   BMI 30.72 kg/m    GEN: Well nourished, well developed, in no acute distress  HEENT: normal  Neck: no JVD, no  masses Cardiac: Normal S1/S2, RRR; no murmurs, rubs, or gallops, no edema   Respiratory:  Decreased breath sounds bilaterally; no wheezing, rhonchi or rales GI: soft, nontender, nondistended MS: no deformity or atrophy  Skin: warm and dry Neuro: No focal deficits  Psych: Alert and oriented x 3, normal affect  Wt Readings from Last 3 Encounters:  11/28/16 208 lb (94.3 kg)  11/02/16 209 lb (94.8 kg)  08/02/16 211 lb (95.7 kg)      Studies/Labs Reviewed:     EKG:  EKG is  ordered today.  The ekg ordered today demonstrates NSR, HR 70, Normal axis, QTc 470 ms no changes  Recent Labs: 12/22/2015: Brain Natriuretic Peptide 54.0 01/04/2016: Pro B Natriuretic peptide (BNP) 55.0 11/02/2016: Hemoglobin 14.5  11/26/2016: ALT 15; BUN 12; Creatinine, Ser 0.97; Platelets 263; Potassium 4.5; Sodium 140; TSH 4.050   Recent Lipid Panel    Component Value Date/Time   CHOL 111 01/11/2015 1248   TRIG 43.0 01/11/2015 1248   HDL 34.20 (L) 01/11/2015 1248   CHOLHDL 3 01/11/2015 1248   VLDL 8.6 01/11/2015 1248   LDLCALC 68 01/11/2015 1248   Additional studies/ records that were reviewed today include:   No results found.  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 Wall motion is good. It is possible that this represents diaphragmatic attenuation. However I cannot rule out the possibility of mild scar with very slight ischemia in the inferior wall. This is a low risk scan. LV Ejection Fraction: 55%. LV Wall Motion: There are no obvious significant wall motion abnormalities.   Holter 2/15 PAF  Echo 12/14 EF 60-65%, no RWMA, mild AI, septal lipomatous hypertrophy  Carotid US 10/14 No ICA stenosis   ASSESSMENT:     1. Paroxysmal atrial fibrillation (HCC)   2. Other hyperlipidemia   3. Essential hypertension   4. 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 Chronic anticoagulation   6. Dizziness     EKG today personally reviewed shows normal sinus rhythm with sinus arrhythmia otherwise normal change from prior.  PLAN:     1. Severe symptomatic microcytic anemia  - resolved, hemoglobin yesterday 14.9. -  Restarted Eliquis, much better tolerated then Xarelto, no bleeding.  2. PAF - Maintaining NSR.  CHADS2-VASc=3. On Eliquis.  3. Chest pain, atypical, with artifact on stress test in April 2016. He is currently symptomatic and no further ischemic workup necessary at this point.  4. HTN - repeated 130/74, we'll continue same management.  5. HL - Continue statin, tolerated  6. LE edema - resolved, continue furosemide 20 mg daily PRN.  7. 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 Dizziness - we will check U carotid  Follow up in 6 months.  Nicholas Alexander, MD  11/28/2016 10:14 AM    [facility] [address] [place], [place], [place]  [address] Phone: [phone]; Fax: [phone]

## 2016-12-06 ENCOUNTER — Other Ambulatory Visit: Payer: Self-pay | Admitting: Acute Care

## 2016-12-06 DIAGNOSIS — Z87891 Personal history of nicotine dependence: Secondary | ICD-10-CM

## 2016-12-12 ENCOUNTER — Other Ambulatory Visit: Payer: Self-pay | Admitting: Cardiology

## 2016-12-12 DIAGNOSIS — R42 Dizziness and giddiness: Secondary | ICD-10-CM

## 2016-12-24 ENCOUNTER — Ambulatory Visit (HOSPITAL_COMMUNITY)
Admission: RE | Admit: 2016-12-24 | Discharge: 2016-12-24 | Disposition: A | Discharge status: Home / Self Care | Payer: BLUE CROSS/BLUE SHIELD | Source: Ambulatory Visit | Attending: Cardiology | Admitting: Cardiology

## 2016-12-24 DIAGNOSIS — I6523 Occlusion and stenosis of bilateral carotid arteries: Secondary | ICD-10-CM | POA: Diagnosis not present

## 2016-12-24 DIAGNOSIS — I700 Atherosclerosis of aorta: Secondary | ICD-10-CM | POA: Diagnosis not present

## 2016-12-24 DIAGNOSIS — I714 Abdominal aortic aneurysm, without rupture, unspecified: Secondary | ICD-10-CM

## 2016-12-24 DIAGNOSIS — R42 Dizziness and giddiness: Secondary | ICD-10-CM | POA: Insufficient documentation

## 2016-12-25 ENCOUNTER — Telehealth: Payer: Self-pay | Admitting: *Deleted

## 2016-12-25 DIAGNOSIS — I714 Abdominal aortic aneurysm, without rupture, unspecified: Secondary | ICD-10-CM

## 2016-12-25 NOTE — Telephone Encounter (Signed)
-----   Message from Lars MassonKatarina H Nelson, MD sent at 12/24/2016  9:30 PM EDT ----- Stable findings, follow up in 1 year

## 2016-12-25 NOTE — Telephone Encounter (Signed)
Notified the pt that per Dr Delton SeeNelson, his AAA US was stable findings, and we will follow up on this in one year.  Informed the pt that I will place the order in the system and [facility] him back to arrange for one year out.  Pt verbalized understanding and agrees with this plan.

## 2017-02-08 ENCOUNTER — Ambulatory Visit (INDEPENDENT_AMBULATORY_CARE_PROVIDER_SITE_OTHER)
Admission: RE | Admit: 2017-02-08 | Discharge: 2017-02-08 | Disposition: A | Discharge status: Home / Self Care | Payer: BLUE CROSS/BLUE SHIELD | Source: Ambulatory Visit | Attending: Acute Care | Admitting: Acute Care

## 2017-02-08 DIAGNOSIS — Z87891 Personal history of nicotine dependence: Secondary | ICD-10-CM

## 2017-02-11 ENCOUNTER — Ambulatory Visit (INDEPENDENT_AMBULATORY_CARE_PROVIDER_SITE_OTHER)
Admission: RE | Admit: 2017-02-11 | Discharge: 2017-02-11 | Disposition: A | Discharge status: Home / Self Care | Payer: BLUE CROSS/BLUE SHIELD | Source: Ambulatory Visit | Attending: Adult Health | Admitting: Adult Health

## 2017-02-11 ENCOUNTER — Telehealth: Payer: Self-pay | Admitting: Acute Care

## 2017-02-11 ENCOUNTER — Ambulatory Visit (INDEPENDENT_AMBULATORY_CARE_PROVIDER_SITE_OTHER): Payer: BLUE CROSS/BLUE SHIELD | Admitting: Adult Health

## 2017-02-11 ENCOUNTER — Other Ambulatory Visit (INDEPENDENT_AMBULATORY_CARE_PROVIDER_SITE_OTHER): Payer: BLUE CROSS/BLUE SHIELD

## 2017-02-11 ENCOUNTER — Encounter: Payer: Self-pay | Admitting: Adult Health

## 2017-02-11 DIAGNOSIS — J9611 Chronic respiratory failure with hypoxia: Secondary | ICD-10-CM | POA: Diagnosis not present

## 2017-02-11 DIAGNOSIS — J449 Chronic obstructive pulmonary disease, unspecified: Secondary | ICD-10-CM

## 2017-02-11 LAB — CBC WITH DIFFERENTIAL/PLATELET
BASOS PCT: 0.3 % (ref 0.0–3.0)
Basophils Absolute: 0 10*3/uL (ref 0.0–0.1)
EOS PCT: 0.9 % (ref 0.0–5.0)
Eosinophils Absolute: 0.1 10*3/uL (ref 0.0–0.7)
HEMATOCRIT: 44.6 % (ref 39.0–52.0)
HEMOGLOBIN: 14.5 g/dL (ref 13.0–17.0)
LYMPHS PCT: 9.2 % — AB (ref 12.0–46.0)
Lymphs Abs: 1.3 10*3/uL (ref 0.7–4.0)
MCHC: 32.6 g/dL (ref 30.0–36.0)
MCV: 87.8 fl (ref 78.0–100.0)
MONOS PCT: 9.5 % (ref 3.0–12.0)
Monocytes Absolute: 1.4 10*3/uL — ABNORMAL HIGH (ref 0.1–1.0)
NEUTROS PCT: 80.1 % — AB (ref 43.0–77.0)
Neutro Abs: 11.4 10*3/uL — ABNORMAL HIGH (ref 1.4–7.7)
Platelets: 231 10*3/uL (ref 150.0–400.0)
RBC: 5.08 Mil/uL (ref 4.22–5.81)
RDW: 14.6 % (ref 11.5–15.5)

## 2017-02-11 LAB — BASIC METABOLIC PANEL
BUN: 15 mg/dL (ref 6–23)
CHLORIDE: 102 meq/L (ref 96–112)
CO2: 28 mEq/L (ref 19–32)
Calcium: 9.6 mg/dL (ref 8.4–10.5)
Creatinine, Ser: 1.03 mg/dL (ref 0.40–1.50)
GFR: 77.01 mL/min (ref >60.00)
Glucose, Bld: 115 mg/dL — ABNORMAL HIGH (ref 70–99)
POTASSIUM: 3.7 meq/L (ref 3.5–5.1)
SODIUM: 137 meq/L (ref 135–145)

## 2017-02-11 MED ORDER — AMOXICILLIN-POT CLAVULANATE 875-125 MG PO TABS: 1.0000 | ORAL_TABLET | Freq: Two times a day (BID) | ORAL | 0 refills | Status: AC

## 2017-02-11 MED ORDER — LEVALBUTEROL HCL 0.63 MG/3ML IN NEBU
0.6300 mg | INHALATION_SOLUTION | Freq: Once | RESPIRATORY_TRACT | Status: AC
  Administered 2017-02-11: 0.63 mg via RESPIRATORY_TRACT

## 2017-02-11 NOTE — Assessment & Plan Note (Signed)
Now with exertional desats ( w/ COPD flare )  Check CBC . And CXR Beign O2 at 2l/m with act  Cont on O2 At bedtime  .  Recheck in 2 weeks.

## 2017-02-11 NOTE — Progress Notes (Signed)
@Patient  ID: Nicholas Camacho, male    DOB: 10/21/1950, 66 y.o.   MRN: [id number]  Chief Complaint  Patient presents with  . Acute Visit    COPD    Referring provider: Farris Has, MD  HPI: 66 year old male former smoker followed for moderate COPD  TEST  PFT's 10/2013:  FEV1 2.38 (72%), ratio 58, no BD response, TLC normal, DLCO 72%  02/11/2017 Follow up : COPD  Pt presents for an acute office visit . Complains of 4 days of cough, congestion , feverish, thick mucus , sob /doe. Coughing up green mucus. No chest pain, orthopnea , edema , or hemoptyiss.  Has moderate COPD . No wheezing . Not taking otc for cough .  Remains on Dulera and Spiriva .  On o2 At bedtime  . Today on arrival to office O2 sats 88% on ra . We discussed beginning O2 with activity .   Pt had LDCT screening 02/08/17 that benign appearing lung nodules -RADS 2S . Recommended for repeat CT in 12 months.   Wants to have blood work rechecked to make sure not anemic.     Allergies  Allergen Reactions  . Morphine And Related     HALLUCINATIONS    Immunization History  Administered Date(s) Administered  . Influenza,inj,Quad PF,36+ Mos 06/20/2015, 11/02/2016  . Pneumococcal Conjugate-13 10/06/2013    Past Medical History:  Diagnosis Date  . Disturbance of skin sensation 06/24/2013  . Elevated cholesterol   . History of echocardiogram    Echo 4/17: EF 60-65%, no RWMA, Gr 2 DD  . Sleep disturbance, unspecified 06/24/2013    Tobacco History: History  Smoking Status  . Former Smoker  . Packs/day: 1.50  . Years: 45.00  . Types: Cigarettes  . Quit date: 11/19/2015  Smokeless Tobacco  . Never Used    Comment: pt quit smoking!! 11/19/15   Counseling given: Not Answered   Outpatient Encounter Prescriptions as of 02/11/2017  Medication Sig  . albuterol (PROAIR HFA) 108 (90 Base) MCG/ACT inhaler USE 2 INHALATIONS EVERY 6 HOURS AS NEEDED FOR WHEEZING OR SHORTNESS OF BREATH  . albuterol (PROVENTIL) (2.5  MG/3ML) 0.083% nebulizer solution USE 1 VIAL WITH NEBULIZER EVERY 6 HOURS AS NEEDED WHEEZING OR SHORTNESS OF BREATH  . apixaban (ELIQUIS) 5 MG TABS tablet Take 1 tablet (5 mg total) by mouth 2 (two) times daily.  [name] atorvastatin (LIPITOR) 10 MG tablet Take 10 mg by mouth daily.  [name] CARTIA XT 120 MG 24 hr capsule Take 1 capsule (120 mg total) by mouth daily.  [name] losartan (COZAAR) 25 MG tablet Take 1 tablet (25 mg total) by mouth daily.  [name] omeprazole (PRILOSEC) 20 MG capsule Take 20 mg by mouth daily as needed.   . Tiotropium Bromide Monohydrate (SPIRIVA RESPIMAT) 2.5 MCG/ACT AERS USE 2 INHALATIONS DAILY  . amoxicillin-clavulanate (AUGMENTIN) 875-125 MG tablet Take 1 tablet by mouth 2 (two) times daily.  . fluticasone furoate-vilanterol (BREO ELLIPTA) 100-25 MCG/INH AEPB Inhale 1 puff into the lungs daily. (Patient not taking: Reported on 11/28/2016)   No facility-administered encounter medications on file as of 02/11/2017.      Review of Systems  Constitutional:   No  weight loss, night sweats,  Fevers, chills, + fatigue, or  lassitude.  HEENT:   No headaches,  Difficulty swallowing,  Tooth/dental problems, or  Sore throat,                No sneezing, itching, ear ache,  +nasal congestion, post nasal drip,  CV:  No chest pain,  Orthopnea, PND, swelling in lower extremities, anasarca, dizziness, palpitations, syncope.   GI  No heartburn, indigestion, abdominal pain, nausea, vomiting, diarrhea, change in bowel habits, loss of appetite, bloody stools.   Resp:   No chest wall deformity  Skin: no rash or lesions.  GU: no dysuria, change in color of urine, no urgency or frequency.  No flank pain, no hematuria   MS:  No joint pain or swelling.  No decreased range of motion.  No back pain.    Physical Exam  BP 116/66 (BP Location: Left Arm, Cuff Size: Normal)   Pulse 87   SpO2 90%   GEN: A/Ox3; pleasant , NAD, elderly    HEENT:  [place]/AT,  EACs-clear, TMs-wnl, NOSE-clear, THROAT-clear,  no lesions, no postnasal drip or exudate noted.   NECK:  Supple w/ fair ROM; no JVD; normal carotid impulses w/o bruits; no thyromegaly or nodules palpated; no lymphadenopathy.    RESP  Few trace rhonchi , . no accessory muscle use, no dullness to percussion  CARD:  RRR, no m/r/g, no peripheral edema, pulses intact, no cyanosis or clubbing.  GI:   Soft & nt; nml bowel sounds; no organomegaly or masses detected.   Musco: Warm bil, no deformities or joint swelling noted.   Neuro: alert, no focal deficits noted.    Skin: Warm, no lesions or rashes    Lab Results:  CBC    Component Value Date/Time   WBC 8.2 11/26/2016 0941   WBC 8.0 11/02/2016 0937   RBC 5.14 11/26/2016 0941   RBC 4.97 11/02/2016 0937   HGB 14.5 11/02/2016 0937   HCT 44.1 11/26/2016 0941   PLT 263 11/26/2016 0941   MCV 86 11/26/2016 0941   MCH 29.0 11/26/2016 0941   MCH 28.4 03/30/2016 0951   MCHC 33.8 11/26/2016 0941   MCHC 33.1 11/02/2016 0937   RDW 14.5 11/26/2016 0941   LYMPHSABS 1.4 11/26/2016 0941   MONOABS 0.7 11/02/2016 0937   EOSABS 0.3 11/26/2016 0941   BASOSABS 0.0 11/26/2016 0941    BMET    Component Value Date/Time   NA 140 11/26/2016 0941   K 4.5 11/26/2016 0941   CL 100 11/26/2016 0941   CO2 18 11/26/2016 0941   GLUCOSE 103 (H) 11/26/2016 0941   GLUCOSE 85 03/30/2016 0951   BUN 12 11/26/2016 0941   CREATININE 0.97 11/26/2016 0941   CREATININE 1.20 03/30/2016 0951   CALCIUM 9.6 11/26/2016 0941   GFRNONAA 82 11/26/2016 0941   GFRAA 95 11/26/2016 0941    BNP    Component Value Date/Time   BNP 54.0 12/22/2015 1042    ProBNP    Component Value Date/Time   PROBNP 55.0 01/04/2016 1204    Imaging: Ct Chest Lung Ca Screen Low Dose W/o Cm  Result Date: 02/08/2017 CLINICAL DATA:  66 year old male former smoker (quit February 2017) with 67-,1/2 pack year history of smoking. Lung cancer screening examination. EXAM: CT CHEST WITHOUT CONTRAST LOW-DOSE FOR LUNG CANCER SCREENING  TECHNIQUE: Multidetector CT imaging of the chest was performed following the standard protocol without IV contrast. COMPARISON:  Low-dose lung cancer screening chest CT 02/08/2016. FINDINGS: Cardiovascular: Heart size is normal. There is no significant pericardial fluid, thickening or pericardial calcification. There is aortic atherosclerosis, as well as atherosclerosis of the great vessels of the mediastinum and the coronary arteries, including calcified atherosclerotic plaque in the left main, left anterior descending, left circumflex and right coronary arteries. Ectasia of the ascending thoracic  aorta (4.2 cm in diameter). Mediastinum/Nodes: No pathologically enlarged mediastinal or hilar lymph nodes. Please note that accurate exclusion of hilar adenopathy is limited on noncontrast CT scans. Esophagus is unremarkable in appearance. No axillary lymphadenopathy. Lungs/Pleura: Small pulmonary nodules, largest of which is associated with the minor fissure with a volume derived mean diameter 5.8 mm (unchanged). No other larger more suspicious appearing pulmonary nodules or masses are noted. No acute consolidative airspace disease. New area of scarring in the inferior segment of the lingula. Mild diffuse bronchial wall thickening with mild centrilobular and paraseptal emphysema. No pleural effusions. Upper Abdomen: Small calcified gallstones lying dependently in the gallbladder. Aortic atherosclerosis. Extensive calcifications throughout the distal body and tail of the pancreas, presumably sequela of chronic pancreatitis. Musculoskeletal: There are no aggressive appearing lytic or blastic lesions noted in the visualized portions of the skeleton. IMPRESSION: 1. Lung-RADS 2S, benign appearance or behavior. Continue annual screening with low-dose chest CT without contrast in 12 months. 2. The "S" modifier above refers to potentially clinically significant non lung cancer related findings. Specifically, there is aortic  atherosclerosis, in addition to left main and 3 vessel coronary artery disease. Please note that although the presence of coronary artery calcium documents the presence of coronary artery disease, the severity of this disease and any potential stenosis cannot be assessed on this non-gated CT examination. Assessment for potential risk factor modification, dietary therapy or pharmacologic therapy may be warranted, if clinically indicated. 3. Mild diffuse bronchial wall thickening with mild centrilobular and paraseptal emphysema; imaging findings suggestive of underlying COPD. 4. Cholelithiasis. Electronically Signed   By: Trudie Reed M.D.   On: 02/08/2017 10:46     Assessment & Plan:   COPD GOLD II Acute exacerbation  xopenex neb x  1 Check cbc, and cxr   Plan  Patient Instructions  Chest xray and labs today  Augmentin 875mg  Twice daily  For 7 days - take w food.  Mucinex DM Twice daily  .As needed  Cough/congesiton  Begin oxygen 2l/m with activity.  Follow up in 2 weeks with Dr. Kendrick Fries and As needed   Tylenol As needed   Please contact office for sooner follow up if symptoms do not improve or worsen or seek emergency care       Chronic respiratory failure with hypoxia [facility]) Now with exertional desats ( w/ COPD flare )  Check CBC . And CXR Beign O2 at 2l/m with act  Cont on O2 At bedtime  .  Recheck in 2 weeks.       Rubye Oaks, NP 02/11/2017

## 2017-02-11 NOTE — Telephone Encounter (Signed)
Patient called back and scheduled an appt with TP to discuss results.Nicholas Camacho.ert

## 2017-02-11 NOTE — Assessment & Plan Note (Signed)
Acute exacerbation  xopenex neb x  1 Check cbc, and cxr   Plan  Patient Instructions  Chest xray and labs today  Augmentin 875mg  Twice daily  For 7 days - take w food.  Mucinex DM Twice daily  .As needed  Cough/congesiton  Begin oxygen 2l/m with activity.  Follow up in 2 weeks with Dr. Kendrick FriesMcQuaid and As needed   Tylenol As needed   Please contact office for sooner follow up if symptoms do not improve or worsen or seek emergency care

## 2017-02-11 NOTE — Addendum Note (Signed)
Addended by: Boone MasterJONES, Emileigh Kellett E on: 02/11/2017 03:04 PM   Modules accepted: Orders

## 2017-02-11 NOTE — Telephone Encounter (Signed)
Pt is currently in office to discuss results with SG.  Will close encounter.

## 2017-02-11 NOTE — Patient Instructions (Addendum)
Chest xray and labs today  Augmentin 875mg  Twice daily  For 7 days - take w food.  Mucinex DM Twice daily  .As needed  Cough/congesiton  Begin oxygen 2l/m with activity.  Follow up in 2 weeks with Dr. Kendrick FriesMcQuaid and As needed   Tylenol As needed   Please contact office for sooner follow up if symptoms do not improve or worsen or seek emergency care

## 2017-02-12 ENCOUNTER — Other Ambulatory Visit: Payer: Self-pay | Admitting: Acute Care

## 2017-02-12 DIAGNOSIS — Z87891 Personal history of nicotine dependence: Secondary | ICD-10-CM

## 2017-02-12 NOTE — Progress Notes (Signed)
Reviewed, agree 

## 2017-02-15 ENCOUNTER — Telehealth: Payer: Self-pay | Admitting: Adult Health

## 2017-02-15 NOTE — Telephone Encounter (Signed)
Parrett, Virgel Bouquetammy S, NP  Lowell BoutonJones, Jessica E, CMA        Blood count is ok , no anemia  WBC is elevated ? Infection - finiish abx and keep follow up  Please contact office for sooner follow up if symptoms do not improve or worsen or seek emergency care    Spoke with pt and notified of results per Rubye Oaksammy Parrett, NP. Pt verbalized understanding and denied any questions.

## 2017-02-15 NOTE — Progress Notes (Signed)
LMOMTCB x 1 

## 2017-02-15 NOTE — Progress Notes (Signed)
Spoke with pt and notified of results per TP. Pt verbalized understanding and denied any questions. 

## 2017-02-26 ENCOUNTER — Encounter: Payer: Self-pay | Admitting: Adult Health

## 2017-02-26 ENCOUNTER — Ambulatory Visit (INDEPENDENT_AMBULATORY_CARE_PROVIDER_SITE_OTHER): Payer: BLUE CROSS/BLUE SHIELD | Admitting: Adult Health

## 2017-02-26 DIAGNOSIS — J449 Chronic obstructive pulmonary disease, unspecified: Secondary | ICD-10-CM

## 2017-02-26 DIAGNOSIS — D72829 Elevated white blood cell count, unspecified: Secondary | ICD-10-CM | POA: Diagnosis not present

## 2017-02-26 DIAGNOSIS — J9611 Chronic respiratory failure with hypoxia: Secondary | ICD-10-CM

## 2017-02-26 NOTE — Assessment & Plan Note (Signed)
Improved today  Pt declines O2 .  Pt education on hypoxia and potential complications

## 2017-02-26 NOTE — Assessment & Plan Note (Signed)
Recent flare now resolved  Plan  Patient Instructions  Continue on Dulera and Spiriva .  Get lab repeated at [facility] as your white blood count was elevated (CBC w/ diff) . As planned in couple of months.  Follow up with Dr. Kendrick FriesMcQuaid in 3 months and As needed   Please contact office for sooner follow up if symptoms do not improve or worsen or seek emergency care

## 2017-02-26 NOTE — Assessment & Plan Note (Signed)
prob secondary to recent copd flare  Keep follow up for labs in August w/ PCP

## 2017-02-26 NOTE — Patient Instructions (Addendum)
Continue on Dulera and Spiriva .  Get lab repeated at [facility] as your white blood count was elevated (CBC w/ diff) . As planned in couple of months.  Follow up with Dr. Kendrick FriesMcQuaid in 3 months and As needed   Please contact office for sooner follow up if symptoms do not improve or worsen or seek emergency care

## 2017-02-26 NOTE — Progress Notes (Signed)
@Patient  ID: Nicholas Camacho, male    DOB: 06-27-1951, 66 y.o.   MRN: [id number]  Chief Complaint  Patient presents with  . Follow-up    COPD     Referring provider: Farris Has, MD  HPI: 66 year old male former smoker followed for moderate COPD  TEST  PFT's 10/2013: FEV1 2.38 (72%), ratio 58, no BD response, TLC normal, DLCO 72%  02/26/2017 Follow up ; COPD and O2 RF  Patient returns for a two-week follow-up. Patient was seen last visit for COPD exacerbation. Given Augmentin 1 week. He was started on oxygen with activity but declined starting this. .  Chest x-ray showed chronic bronchitic changes with slight increase on the right. LDCT screen chest on 02/08/17 showed a benign appearance with rec for 12 mon.   Since last visit. Patient is feeling so much better. Breathing is doing well. Cough and dyspnea are decreased.  He denies chest pain, orthopnea , edema or fever or hemoptysis .  Eating good , no n/v.d.  Remains on Dulera (has 4 mon supply at home , then will switch [facility] -insurance formulary coverage) and Spiriva.     Allergies  Allergen Reactions  . Morphine And Related     HALLUCINATIONS    Immunization History  Administered Date(s) Administered  . Influenza,inj,Quad PF,36+ Mos 06/20/2015, 11/02/2016  . Pneumococcal Conjugate-13 10/06/2013    Past Medical History:  Diagnosis Date  . Disturbance of skin sensation 06/24/2013  . Elevated cholesterol   . History of echocardiogram    Echo 4/17: EF 60-65%, no RWMA, Gr 2 DD  . Sleep disturbance, unspecified 06/24/2013    Tobacco History: History  Smoking Status  . Former Smoker  . Packs/day: 1.50  . Years: 45.00  . Types: Cigarettes  . Quit date: 11/19/2015  Smokeless Tobacco  . Never Used    Comment: pt quit smoking!! 11/19/15   Counseling given: Not Answered   Outpatient Encounter Prescriptions as of 02/26/2017  Medication Sig  . albuterol (PROAIR HFA) 108 (90 Base) MCG/ACT inhaler USE 2 INHALATIONS  EVERY 6 HOURS AS NEEDED FOR WHEEZING OR SHORTNESS OF BREATH  . albuterol (PROVENTIL) (2.5 MG/3ML) 0.083% nebulizer solution USE 1 VIAL WITH NEBULIZER EVERY 6 HOURS AS NEEDED WHEEZING OR SHORTNESS OF BREATH  . apixaban (ELIQUIS) 5 MG TABS tablet Take 1 tablet (5 mg total) by mouth 2 (two) times daily.  [name] atorvastatin (LIPITOR) 10 MG tablet Take 10 mg by mouth daily.  [name] CARTIA XT 120 MG 24 hr capsule Take 1 capsule (120 mg total) by mouth daily.  [name] losartan (COZAAR) 25 MG tablet Take 1 tablet (25 mg total) by mouth daily.  [name] omeprazole (PRILOSEC) 20 MG capsule Take 20 mg by mouth daily as needed.   . Tiotropium Bromide Monohydrate (SPIRIVA RESPIMAT) 2.5 MCG/ACT AERS USE 2 INHALATIONS DAILY  . fluticasone furoate-vilanterol (BREO ELLIPTA) 100-25 MCG/INH AEPB Inhale 1 puff into the lungs daily. (Patient not taking: Reported on 11/28/2016)   No facility-administered encounter medications on file as of 02/26/2017.      Review of Systems  Constitutional:   No  weight loss, night sweats,  Fevers, chills, fatigue, or  lassitude.  HEENT:   No headaches,  Difficulty swallowing,  Tooth/dental problems, or  Sore throat,                No sneezing, itching, ear ache, nasal congestion, post nasal drip,   CV:  No chest pain,  Orthopnea, PND, swelling in lower extremities, anasarca, dizziness,  palpitations, syncope.   GI  No heartburn, indigestion, abdominal pain, nausea, vomiting, diarrhea, change in bowel habits, loss of appetite, bloody stools.   Resp:    No excess mucus, no productive cough,  No non-productive cough,  No coughing up of blood.  No change in color of mucus.  No wheezing.  No chest wall deformity  Skin: no rash or lesions.  GU: no dysuria, change in color of urine, no urgency or frequency.  No flank pain, no hematuria   MS:  No joint pain or swelling.  No decreased range of motion.  No back pain.    Physical Exam  BP 120/78 (BP Location: Right Arm, Cuff Size: Large)   Pulse 74    Resp 18   Ht 5\' 9"  (1.753 m)   Wt 204 lb 6.4 oz (92.7 kg)   SpO2 92%   BMI 30.18 kg/m   GEN: A/Ox3; pleasant , NAD, well nourished    HEENT:  [place]/AT,  EACs-clear, TMs-wnl, NOSE-clear, THROAT-clear, no lesions, no postnasal drip or exudate noted.   NECK:  Supple w/ fair ROM; no JVD; normal carotid impulses w/o bruits; no thyromegaly or nodules palpated; no lymphadenopathy.    RESP  Clear  P & A; w/o, wheezes/ rales/ or rhonchi. no accessory muscle use, no dullness to percussion  CARD:  RRR, no m/r/g, no peripheral edema, pulses intact, no cyanosis or clubbing.  GI:   Soft & nt; nml bowel sounds; no organomegaly or masses detected.   Musco: Warm bil, no deformities or joint swelling noted.   Neuro: alert, no focal deficits noted.    Skin: Warm, no lesions or rashes    Lab Results:   BMET    Imaging: Dg Chest 2 View  Result Date: 02/11/2017 CLINICAL DATA:  Wheezing with shortness of breath EXAM: CHEST  2 VIEW COMPARISON:  02/08/2017, radiograph 08/02/2016 FINDINGS: Hyperinflation with emphysematous changes. Coarse interstitial opacities likely indicative of chronic change. Slight increased interstitial prominence within the right lower lobe. Stable cardiomediastinal silhouette. No pneumothorax. Degenerative changes of the spine. IMPRESSION: 1. Hyperinflation with emphysematous changes. 2. Coarse interstitial opacities likely chronic, however slight increased interstitial prominence within the right lung base suggests superimposed interstitial inflammation Electronically Signed   By: Jasmine Pang M.D.   On: 02/11/2017 15:10   Ct Chest Lung Ca Screen Low Dose W/o Cm  Result Date: 02/08/2017 CLINICAL DATA:  66 year old male former smoker (quit February 2017) with 67-,1/2 pack year history of smoking. Lung cancer screening examination. EXAM: CT CHEST WITHOUT CONTRAST LOW-DOSE FOR LUNG CANCER SCREENING TECHNIQUE: Multidetector CT imaging of the chest was performed following the  standard protocol without IV contrast. COMPARISON:  Low-dose lung cancer screening chest CT 02/08/2016. FINDINGS: Cardiovascular: Heart size is normal. There is no significant pericardial fluid, thickening or pericardial calcification. There is aortic atherosclerosis, as well as atherosclerosis of the great vessels of the mediastinum and the coronary arteries, including calcified atherosclerotic plaque in the left main, left anterior descending, left circumflex and right coronary arteries. Ectasia of the ascending thoracic aorta (4.2 cm in diameter). Mediastinum/Nodes: No pathologically enlarged mediastinal or hilar lymph nodes. Please note that accurate exclusion of hilar adenopathy is limited on noncontrast CT scans. Esophagus is unremarkable in appearance. No axillary lymphadenopathy. Lungs/Pleura: Small pulmonary nodules, largest of which is associated with the minor fissure with a volume derived mean diameter 5.8 mm (unchanged). No other larger more suspicious appearing pulmonary nodules or masses are noted. No acute consolidative airspace disease. New area  of scarring in the inferior segment of the lingula. Mild diffuse bronchial wall thickening with mild centrilobular and paraseptal emphysema. No pleural effusions. Upper Abdomen: Small calcified gallstones lying dependently in the gallbladder. Aortic atherosclerosis. Extensive calcifications throughout the distal body and tail of the pancreas, presumably sequela of chronic pancreatitis. Musculoskeletal: There are no aggressive appearing lytic or blastic lesions noted in the visualized portions of the skeleton. IMPRESSION: 1. Lung-RADS 2S, benign appearance or behavior. Continue annual screening with low-dose chest CT without contrast in 12 months. 2. The "S" modifier above refers to potentially clinically significant non lung cancer related findings. Specifically, there is aortic atherosclerosis, in addition to left main and 3 vessel coronary artery disease.  Please note that although the presence of coronary artery calcium documents the presence of coronary artery disease, the severity of this disease and any potential stenosis cannot be assessed on this non-gated CT examination. Assessment for potential risk factor modification, dietary therapy or pharmacologic therapy may be warranted, if clinically indicated. 3. Mild diffuse bronchial wall thickening with mild centrilobular and paraseptal emphysema; imaging findings suggestive of underlying COPD. 4. Cholelithiasis. Electronically Signed   By: Trudie Reedaniel  Entrikin M.D.   On: 02/08/2017 10:46     Assessment & Plan:   COPD GOLD II Recent flare now resolved  Plan  Patient Instructions  Continue on Dulera and Spiriva .  Get lab repeated at Primary [facility] as your white blood count was elevated (CBC w/ diff) . As planned in couple of months.  Follow up with Dr. Kendrick FriesMcQuaid in 3 months and As needed   Please contact office for sooner follow up if symptoms do not improve or worsen or seek emergency care        Chronic respiratory failure with hypoxia (HCC) Improved today  Pt declines O2 .  Pt education on hypoxia and potential complications  Leukocytosis prob secondary to recent copd flare  Keep follow up for labs in August w/ PCP      Rubye Oaksammy Calyn Rubi, NP 02/26/2017

## 2017-02-28 ENCOUNTER — Other Ambulatory Visit: Payer: Self-pay | Admitting: Cardiology

## 2017-05-28 ENCOUNTER — Encounter: Payer: Self-pay | Admitting: Pulmonary Disease

## 2017-05-28 ENCOUNTER — Ambulatory Visit (INDEPENDENT_AMBULATORY_CARE_PROVIDER_SITE_OTHER): Payer: BLUE CROSS/BLUE SHIELD | Admitting: Pulmonary Disease

## 2017-05-28 VITALS — BP 136/82 | HR 71 | Ht 69.0 in | Wt 201.4 lb

## 2017-05-28 DIAGNOSIS — J9611 Chronic respiratory failure with hypoxia: Secondary | ICD-10-CM

## 2017-05-28 DIAGNOSIS — J449 Chronic obstructive pulmonary disease, unspecified: Secondary | ICD-10-CM | POA: Diagnosis not present

## 2017-05-28 DIAGNOSIS — J441 Chronic obstructive pulmonary disease with (acute) exacerbation: Secondary | ICD-10-CM

## 2017-05-28 MED ORDER — AMOXICILLIN-POT CLAVULANATE 875-125 MG PO TABS: 1.0000 | ORAL_TABLET | Freq: Two times a day (BID) | ORAL | 0 refills | Status: DC

## 2017-05-28 NOTE — Patient Instructions (Signed)
For your COPD with acute exacerbation: Take Augmentin as prescribed for the next 5 days Take Mucinex twice a day Continue taking Dulera until you run out of that supply, then call us so that we can prescribe Breo to take once daily Keep taking Spiriva as you are doing  We will see you back in 4-6 months or sooner if needed

## 2017-05-28 NOTE — Progress Notes (Signed)
Subjective:    Patient ID: Nicholas Camacho, male    DOB: 07-09-1951, 66 y.o.   MRN: [id number]   Synopsis: Former patient of Dr. Shelle Iron has COPD.  He quit smoking in 11/2015. PFT's 10/2013:  FEV1 2.38 (72%), ratio 58, no BD response, TLC normal, DLCO 72% Had significant weight from fluid retention gain while on a prednisone taper for a COPD exacerbation.  HPI Chief Complaint  Patient presents with  . Follow-up    Pt states that he was in office 02/26/17 due to developing a bug. Pt did get over it but then developed a cold from daughter and wife; has some postnasal drip, some SOB, deep chest cough.   Jamile said that after he saw Tammy in May the augmentin helped quite a bit.  He says that he has been dealing with a head cold right now.  He is taking some Zicam and theraflu.  He says that he is having some sinus drainage and chest congestion.  He has also noted dyspnea while climbing a flight of stairs in the last week.  All these symptoms have been going on for a week.  Prior to the recent cold he says that his dyspnea was doing OK.  He notices some worsening when the air is hot and humid.    He had another pneumonia vaccine in May, he thinks it was the prevnar vaccine.    Past Medical History:  Diagnosis Date  . Disturbance of skin sensation 06/24/2013  . Elevated cholesterol   . History of echocardiogram    Echo 4/17: EF 60-65%, no RWMA, Gr 2 DD  . Sleep disturbance, unspecified 06/24/2013      Review of Systems  Constitutional: Negative for chills, fatigue and fever.  HENT: Negative for rhinorrhea, sinus pressure and sneezing.   Respiratory: Positive for cough, shortness of breath and wheezing.   Cardiovascular: Negative for chest pain, palpitations and leg swelling.       Objective:   Physical Exam Vitals:   05/28/17 1627  BP: 136/82  Pulse: 71  SpO2: 96%  Weight: 201 lb 6.4 oz (91.4 kg)  Height: 5\' 9"  (1.753 m)   RA  Gen: chronically ill appearing HENT: OP  clear, TM's clear, neck supple PULM: Wheezing bilaterally B, normal percussion CV: RRR, no mgr, trace edema GI: BS+, soft, nontender Derm: no cyanosis or rash Psyche: normal mood and affect    CBC    Component Value Date/Time   WBC 14.2 Repeated and verified X2. (H) 02/11/2017 1508   RBC 5.08 02/11/2017 1508   HGB 14.5 02/11/2017 1508   HGB 14.9 11/26/2016 0941   HCT 44.6 02/11/2017 1508   HCT 44.1 11/26/2016 0941   PLT 231.0 02/11/2017 1508   PLT 263 11/26/2016 0941   MCV 87.8 02/11/2017 1508   MCV 86 11/26/2016 0941   MCH 29.0 11/26/2016 0941   MCH 28.4 03/30/2016 0951   MCHC 32.6 02/11/2017 1508   RDW 14.6 02/11/2017 1508   RDW 14.5 11/26/2016 0941   LYMPHSABS 1.3 02/11/2017 1508   LYMPHSABS 1.4 11/26/2016 0941   MONOABS 1.4 (H) 02/11/2017 1508   EOSABS 0.1 02/11/2017 1508   EOSABS 0.3 11/26/2016 0941   BASOSABS 0.0 02/11/2017 1508   BASOSABS 0.0 11/26/2016 0941    .    Assessment & Plan:   Chronic respiratory failure with hypoxia (HCC)  COPD GOLD II  COPD with acute exacerbation (HCC)  Discussion: Unfortunately she is having another exacerbation of his severe  COPD because his family has been sick recently and he had some sick contacts. Prior to that he had been doing fairly well. He has been noted this year to have mild exertional hypoxemia while short of breath. Because his O2 saturation has dropped no lower than about the mid 80s there is no clinical data from the scientific literature to suggest that he would benefit from supplemental oxygen. He says he doesn't want to try it anyway.  Plan: For your COPD with acute exacerbation: Take Augmentin as prescribed for the next 5 days (he wants to avoid prednisone) Take Mucinex twice a day Continue taking Dulera until you run out of that supply, then call us so that we can prescribe Breo to take once daily Keep taking Spiriva as you are doing  We will see you back in 4-6 months or sooner if needed    Current  Outpatient Prescriptions:  .  albuterol (PROVENTIL) (2.5 MG/3ML) 0.083% nebulizer solution, USE 1 VIAL WITH NEBULIZER EVERY 6 HOURS AS NEEDED WHEEZING OR SHORTNESS OF BREATH, Disp: 1080 mL, Rfl: 3 .  apixaban (ELIQUIS) 5 MG TABS tablet, Take 1 tablet (5 mg total) by mouth 2 (two) times daily., Disp: 180 tablet, Rfl: 1 .  atorvastatin (LIPITOR) 10 MG tablet, Take 10 mg by mouth daily., Disp: , Rfl:  .  CARTIA XT 120 MG 24 hr capsule, Take 1 capsule (120 mg total) by mouth daily., Disp: 90 capsule, Rfl: 1 .  losartan (COZAAR) 25 MG tablet, Take 1 tablet (25 mg total) by mouth daily., Disp: 90 tablet, Rfl: 1 .  mometasone-formoterol (DULERA) 200-5 MCG/ACT AERO, Inhale 2 puffs into the lungs 2 (two) times daily., Disp: , Rfl:  .  omeprazole (PRILOSEC) 20 MG capsule, Take 20 mg by mouth daily as needed. , Disp: , Rfl: 12 .  PROAIR HFA 108 (90 Base) MCG/ACT inhaler, USE 2 INHALATIONS ORALLY   EVERY 6 HOURS AS NEEDED FORWHEEZING OR SHORTNESS OF   BREATH, Disp: 8.5 g, Rfl: 3 .  Tiotropium Bromide Monohydrate (SPIRIVA RESPIMAT) 2.5 MCG/ACT AERS, USE 2 INHALATIONS DAILY, Disp: 12 g, Rfl: 3 .  amoxicillin-clavulanate (AUGMENTIN) 875-125 MG tablet, Take 1 tablet by mouth 2 (two) times daily., Disp: 10 tablet, Rfl: 0 .  fluticasone furoate-vilanterol (BREO ELLIPTA) 100-25 MCG/INH AEPB, Inhale 1 puff into the lungs daily. (Patient not taking: Reported on 11/28/2016), Disp: 1 each, Rfl: 0

## 2017-06-04 ENCOUNTER — Other Ambulatory Visit: Payer: Self-pay | Admitting: Cardiology

## 2017-06-05 NOTE — Telephone Encounter (Signed)
Okay to refill? Please advise. Thanks, MI 

## 2017-06-05 NOTE — Telephone Encounter (Signed)
Please refer this to the pts Pulmonologist Dr Kendrick FriesMcQuaid, for he just saw the pt as an OV on 05/28/17.  Dr Kendrick FriesMcQuaid follows the pts COPD and has prescribed him several different inhalers.  For safety and continuity, his Pulmonologist should further advise on this inhaler.

## 2017-06-07 ENCOUNTER — Other Ambulatory Visit: Payer: Self-pay | Admitting: Cardiology

## 2017-06-07 DIAGNOSIS — E785 Hyperlipidemia, unspecified: Secondary | ICD-10-CM

## 2017-06-07 DIAGNOSIS — I10 Essential (primary) hypertension: Secondary | ICD-10-CM

## 2017-06-07 DIAGNOSIS — D509 Iron deficiency anemia, unspecified: Secondary | ICD-10-CM

## 2017-06-07 DIAGNOSIS — I480 Paroxysmal atrial fibrillation: Secondary | ICD-10-CM

## 2017-06-10 NOTE — Telephone Encounter (Signed)
Age 66 years Wt 91.4kg 05/28/2017 Saw Dr Delton SeeNelson on 11/28/2016 02/11/2017 Hgb 14.5 HCT 44.6  SrCr 1.03 Refill done for Eliquis 5mg  q 12 hours as requested

## 2017-07-04 ENCOUNTER — Other Ambulatory Visit: Payer: Self-pay | Admitting: Cardiology

## 2017-07-08 ENCOUNTER — Other Ambulatory Visit: Payer: Self-pay | Admitting: *Deleted

## 2017-07-08 ENCOUNTER — Telehealth: Payer: Self-pay | Admitting: Cardiology

## 2017-07-08 NOTE — Telephone Encounter (Signed)
Pt called about a refill of his [employer].  Please refill.

## 2017-07-18 ENCOUNTER — Telehealth: Payer: Self-pay | Admitting: Pulmonary Disease

## 2017-07-18 MED ORDER — FLUTICASONE FUROATE-VILANTEROL 100-25 MCG/INH IN AEPB: 1.0000 | INHALATION_SPRAY | Freq: Every day | RESPIRATORY_TRACT | 1 refills | Status: DC

## 2017-07-18 NOTE — Telephone Encounter (Signed)
Sent in Rx to [facility] mail service. I called pt to let him know, no answer but left detailed message letting him know this was sent. Nothing further is needed.

## 2017-07-25 ENCOUNTER — Ambulatory Visit (INDEPENDENT_AMBULATORY_CARE_PROVIDER_SITE_OTHER): Payer: BLUE CROSS/BLUE SHIELD | Admitting: Cardiology

## 2017-07-25 ENCOUNTER — Encounter: Payer: Self-pay | Admitting: Cardiology

## 2017-07-25 VITALS — BP 128/62 | HR 66 | Ht 69.0 in | Wt 204.0 lb

## 2017-07-25 DIAGNOSIS — R072 Precordial pain: Secondary | ICD-10-CM | POA: Diagnosis not present

## 2017-07-25 DIAGNOSIS — Z7901 Long term (current) use of anticoagulants: Secondary | ICD-10-CM | POA: Diagnosis not present

## 2017-07-25 DIAGNOSIS — I2510 Atherosclerotic heart disease of native coronary artery without angina pectoris: Secondary | ICD-10-CM | POA: Diagnosis not present

## 2017-07-25 DIAGNOSIS — Z23 Encounter for immunization: Secondary | ICD-10-CM | POA: Diagnosis not present

## 2017-07-25 DIAGNOSIS — I480 Paroxysmal atrial fibrillation: Secondary | ICD-10-CM | POA: Diagnosis not present

## 2017-07-25 DIAGNOSIS — I714 Abdominal aortic aneurysm, without rupture, unspecified: Secondary | ICD-10-CM

## 2017-07-25 DIAGNOSIS — R42 Dizziness and giddiness: Secondary | ICD-10-CM | POA: Diagnosis not present

## 2017-07-25 DIAGNOSIS — R079 Chest pain, unspecified: Secondary | ICD-10-CM | POA: Insufficient documentation

## 2017-07-25 DIAGNOSIS — E785 Hyperlipidemia, unspecified: Secondary | ICD-10-CM

## 2017-07-25 LAB — LIPID PANEL
Chol/HDL Ratio: 3.2 ratio (ref 0.0–5.0)
Cholesterol, Total: 101 mg/dL (ref 100–199)
HDL: 32 mg/dL — ABNORMAL LOW (ref >39)
LDL Calculated: 62 mg/dL (ref 0–99)
Triglycerides: 35 mg/dL (ref 0–149)
VLDL Cholesterol Cal: 7 mg/dL (ref 5–40)

## 2017-07-25 LAB — COMPREHENSIVE METABOLIC PANEL
ALT: 14 IU/L (ref 0–44)
AST: 15 IU/L (ref 0–40)
Albumin/Globulin Ratio: 2.1 (ref 1.2–2.2)
Albumin: 4.6 g/dL (ref 3.6–4.8)
Alkaline Phosphatase: 112 IU/L (ref 39–117)
BUN/Creatinine Ratio: 14 (ref 10–24)
BUN: 13 mg/dL (ref 8–27)
Bilirubin Total: 0.3 mg/dL (ref 0.0–1.2)
CO2: 25 mmol/L (ref 20–29)
Calcium: 9.3 mg/dL (ref 8.6–10.2)
Chloride: 104 mmol/L (ref 96–106)
Creatinine, Ser: 0.94 mg/dL (ref 0.76–1.27)
GFR calc Af Amer: 98 mL/min/{1.73_m2} (ref >59)
GFR calc non Af Amer: 85 mL/min/{1.73_m2} (ref >59)
Globulin, Total: 2.2 g/dL (ref 1.5–4.5)
Glucose: 104 mg/dL — ABNORMAL HIGH (ref 65–99)
Potassium: 4.5 mmol/L (ref 3.5–5.2)
Sodium: 140 mmol/L (ref 134–144)
Total Protein: 6.8 g/dL (ref 6.0–8.5)

## 2017-07-25 LAB — CBC WITH DIFFERENTIAL/PLATELET
Basophils Absolute: 0 10*3/uL (ref 0.0–0.2)
Basos: 0 %
EOS (ABSOLUTE): 0.3 10*3/uL (ref 0.0–0.4)
Eos: 3 %
Hematocrit: 44.2 % (ref 37.5–51.0)
Hemoglobin: 15 g/dL (ref 13.0–17.7)
Immature Grans (Abs): 0 10*3/uL (ref 0.0–0.1)
Immature Granulocytes: 0 %
Lymphocytes Absolute: 1.6 10*3/uL (ref 0.7–3.1)
Lymphs: 19 %
MCH: 29.3 pg (ref 26.6–33.0)
MCHC: 33.9 g/dL (ref 31.5–35.7)
MCV: 86 fL (ref 79–97)
Monocytes Absolute: 0.8 10*3/uL (ref 0.1–0.9)
Monocytes: 9 %
Neutrophils Absolute: 5.9 10*3/uL (ref 1.4–7.0)
Neutrophils: 69 %
Platelets: 263 10*3/uL (ref 150–379)
RBC: 5.12 x10E6/uL (ref 4.14–5.80)
RDW: 14.3 % (ref 12.3–15.4)
WBC: 8.6 10*3/uL (ref 3.4–10.8)

## 2017-07-25 LAB — TSH: TSH: 3.04 u[IU]/mL (ref 0.450–4.500)

## 2017-07-25 NOTE — Patient Instructions (Addendum)
Medication Instructions:   Your physician recommends that you continue on your current medications as directed. Please refer to the Current Medication list given to you today.    Labwork:  TODAY--CMET, CBC W DIFF, TSH, AND LIPIDS     Testing/Procedures:  CORONARY CT WITH FFR  Please [facility] at xx:xx AM (30-45 minutes prior to test start time)  [facility] [address] [place], [place] [id number] [phone]  Proceed to the [facility] Radiology Department (First Floor).  Please follow these instructions carefully (unless otherwise directed):    On the Night Before the Test: . Drink plenty of water. . Do not consume any caffeinated/decaffeinated beverages or chocolate 12 hours prior to your test. . Do not take any antihistamines 12 hours prior to your test.   On the Day of the Test: . Drink plenty of water. Do not drink any water within one hour of the test. . Do not eat any food 4 hours prior to the test. . You may take your regular medications prior to the test.    After the Test: . Drink plenty of water. . After receiving IV contrast, you may experience a mild flushed feeling. This is normal. . On occasion, you may experience a mild rash up to 24 hours after the test. This is not dangerous. If this occurs, you can take Benadryl 25 mg and increase your fluid intake. . If you experience trouble breathing, this can be serious. If it is severe call 911 IMMEDIATELY. If it is mild, please call our office.    Follow-Up:  Your physician wants you to follow-up in: 6 MONTHS WITH DR Johnell ComingsNELSON You will receive a reminder letter in the mail two months in advance. If you don't receive a letter, please call our office to schedule the follow-up appointment.        If you need a refill on your cardiac medications before your next appointment, please call your pharmacy.

## 2017-07-25 NOTE — Progress Notes (Signed)
Patient ID: Nicholas Camacho, male   DOB: 07/30/1951, 66 y.o.   MRN: [id number]    Cardiology Office Note:    Date:  07/25/2017   ID:  Nicholas Camacho, DOB 08/18/1951, MRN [id number]  PCP:  [name], [name], MD  Cardiologist:  Dr. Tobias AlexanderKatarina Abdiaziz Klahn   Electrophysiologist:  n/a  Referring MD: [name], [name], MD   Chief complain: none  History of Present Illness:     Nicholas Camacho is a 66 y.o. male with a hx of HTN, HL, COPD, PAF, OSA, small AAA and iliac aneurysm. CHADS2-VASc=3 (HTN, prior TIA).  He is on Xarelto for anticoagulation. Last seen by [name] 4/16. He noted significant dyspnea on exertion.  Nuclear stress test was felt to be low risk.   Recently seen in pulmonology in follow-up. He complained of lower extremity edema and was placed on Lasix. He was just seen again yesterday with continued LE edema. Dyspnea was also worse. He also admitted to chest discomfort. He is added on for further evaluation.  The patient was seen by [name] on April 6 and complained of profound fatigue and dyspnea on exertion. His CBC showed hemoglobin of 5.2 and [facility]. He received 4 units of blood his Xarelto was discontinued he had very low iron levels and received IV iron. GI performed upper EGD that showed esophagitis and colonoscopy didn't reveal anything. He supposed to follow with his GI doctor tomorrow to determine if he needs capsule endoscopy. His most recent hemoglobin was 9.1 on April 11 when he was discharged from the hospital. He feels significantly better which much more energy less shortness of breath and no chest pain. He denies any palpitations or syncope.  02/29/2016 - 6 weeks follow-up, the patient feels significantly better, he follows with GI he is EGD refill esophagitis and was treated with course of PPIs that are now discontinued. He was started on iron tear PT and his hemoglobin is now 13.1. Because of significant improvement GI decided not to  proceed with capsule endoscopy. Patient is still not taking anticoagulation. He overall feels significantly better with no shortness of breath and no chest pain, no recent palpitations no syncope no lower extremity edema orthopnea or paroxysmal nocturnal dyspnea. He continues to smoke.  06/01/2016 - 3 months follow up, feels great, works full time, restarted Eliquis, much better tolerated than Xarelto, minimal bleeding with cuts at work, stable Hb 13.6. No LE edema, no chest pain or SOB.   11/27/2016, 6 months follow-up, patient is feeling great, no bleeding with Eliquis and his anemia has resolved most recent hemoglobin 14.9. He denies any palpitations presyncope or syncope. No chest pain, occasional shortness of breath. It has been 1 year since he quit smoking had good support with pulmonary follow-up. He is experiencing postnasal drip, denies any muscle pain with Lipitor. He has been experiencing dizziness that's not orthostatic but for example while driving, it happens approximately once a months.  07/25/2017 - the patient is coming after 6 months, he has been noticing some dizziness while driving sometimes on exertion. Also some mild fatigue on exertion that unusual for him. He's been compliant with his medication and he is tolerating it well. No recent bleeding. No muscle pain with Lipitor.  Past Medical History:  Diagnosis Date  . Disturbance of skin sensation 06/24/2013  . Elevated cholesterol   . History of echocardiogram    Echo 4/17: EF 60-65%, no RWMA, Gr 2 DD  . Sleep  disturbance, unspecified 06/24/2013    Past Surgical History:  Procedure Laterality Date  . ESOPHAGOGASTRODUODENOSCOPY N/A 01/08/2016   Procedure: ESOPHAGOGASTRODUODENOSCOPY (EGD);  Surgeon: Carman Ching, MD;  Location: [facility] ENDOSCOPY;  Service: Endoscopy;  Laterality: N/A;  . fractured pelvis    . INGUINAL HERNIA REPAIR Bilateral   . reconstruct left heel  2002  . TONSILLECTOMY      Current Medications: Outpatient  Medications Prior to Visit  Medication Sig Dispense Refill  . albuterol (PROVENTIL) (2.5 MG/3ML) 0.083% nebulizer solution USE 1 VIAL WITH NEBULIZER EVERY 6 HOURS AS NEEDED WHEEZING OR SHORTNESS OF BREATH 1080 mL 3  . atorvastatin (LIPITOR) 10 MG tablet Take 10 mg by mouth daily.    [name] CARTIA XT 120 MG 24 hr capsule Take 1 capsule (120 mg total) by mouth daily. 90 capsule 0  . ELIQUIS 5 MG TABS tablet TAKE 1 TABLET TWICE A DAY 180 tablet 1  . fluticasone furoate-vilanterol (BREO ELLIPTA) 100-25 MCG/INH AEPB Inhale 1 puff into the lungs daily. 3 each 1  . losartan (COZAAR) 25 MG tablet Take 1 tablet (25 mg total) by mouth daily. 90 tablet 0  . omeprazole (PRILOSEC) 20 MG capsule Take 20 mg by mouth daily as needed.   12  . PROAIR HFA 108 (90 Base) MCG/ACT inhaler USE 2 INHALATIONS ORALLY   EVERY 6 HOURS AS NEEDED FORWHEEZING OR SHORTNESS OF   BREATH 8.5 g 3  . Tiotropium Bromide Monohydrate (SPIRIVA RESPIMAT) 2.5 MCG/ACT AERS USE 2 INHALATIONS DAILY 12 g 3  . amoxicillin-clavulanate (AUGMENTIN) 875-125 MG tablet Take 1 tablet by mouth 2 (two) times daily. (Patient not taking: Reported on 07/25/2017) 10 tablet 0  . mometasone-formoterol (DULERA) 200-5 MCG/ACT AERO Inhale 2 puffs into the lungs 2 (two) times daily.     No facility-administered medications prior to visit.      Allergies:   Morphine and related   Social History   Social History  . Marital status: Married    Spouse name: N/A  . Number of children: 2  . Years of education: COLLEGE   Occupational History  . pest control    Social History Main Topics  . Smoking status: Former Smoker    Packs/day: 1.50    Years: 45.00    Types: Cigarettes    Quit date: 11/19/2015  . Smokeless tobacco: Never Used     Comment: pt quit smoking!! 11/19/15  . Alcohol use No  . Drug use: No  . Sexual activity: Not Asked   Other Topics Concern  . None   Social History Narrative  . None     Family History:  The patient's family history  includes Alcoholism in his sister; Diabetes in his mother; Drug abuse in his sister; Kidney failure in his father; Prostate cancer in his father.   ROS:   Please see the history of present illness.    ROS All other systems reviewed and are negative.   Physical Exam:    VS:  BP 128/62   Pulse 66   Ht 5\' 9"  (1.753 m)   Wt 204 lb (92.5 kg) Comment: fully clothe in work gear  SpO2 97%   BMI 30.13 kg/m    GEN: Well nourished, well developed, in no acute distress  HEENT: normal  Neck: no JVD, no masses Cardiac: Normal S1/S2, RRR; no murmurs, rubs, or gallops, no edema   Respiratory:  Decreased breath sounds bilaterally; no wheezing, rhonchi or rales GI: soft, nontender, nondistended MS: no deformity or atrophy  Skin: warm and dry Neuro: No focal deficits  Psych: Alert and oriented x 3, normal affect  Wt Readings from Last 3 Encounters:  07/25/17 204 lb (92.5 kg)  05/28/17 201 lb 6.4 oz (91.4 kg)  02/26/17 204 lb 6.4 oz (92.7 kg)      Studies/Labs Reviewed:     EKG:  EKG is  ordered today.  The ekg ordered today demonstrates NSR, HR 70, Normal axis, QTc 470 ms no changes  Recent Labs: 11/26/2016: ALT 15; TSH 4.050 02/11/2017: BUN 15; Creatinine, Ser 1.03; Hemoglobin 14.5; Platelets 231.0; Potassium 3.7; Sodium 137   Recent Lipid Panel    Component Value Date/Time   CHOL 111 01/11/2015 1248   TRIG 43.0 01/11/2015 1248   HDL 34.20 (L) 01/11/2015 1248   CHOLHDL 3 01/11/2015 1248   VLDL 8.6 01/11/2015 1248   LDLCALC 68 01/11/2015 1248   Additional studies/ records that were reviewed today include:   No results found.  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 Wall motion is good. It is possible that this represents diaphragmatic attenuation. However I cannot rule out the possibility of mild scar with very slight ischemia in the inferior wall. This is a low  risk scan. LV Ejection Fraction: 55%. LV Wall Motion: There are no obvious significant wall motion abnormalities.   Holter 2/15 PAF  Echo 12/14 EF 60-65%, no RWMA, mild AI, septal lipomatous hypertrophy  Carotid US 10/14 No ICA stenosis   02/08/2017 - CT Chest   1. Lung-RADS 2S, benign appearance or behavior. Continue annual screening with low-dose chest CT without contrast in 12 months. 2. The "S" modifier above refers to potentially clinically significant non lung cancer related findings. Specifically, there is aortic atherosclerosis, in addition to left main and 3 vessel coronary artery disease. Please note that although the presence of coronary artery calcium documents the presence of coronary artery disease, the severity of this disease and any potential stenosis cannot be assessed on this non-gated CT examination. Assessment for potential risk factor modification, dietary therapy or pharmacologic therapy may be warranted, if clinically indicated. 3. Mild diffuse bronchial wall thickening with mild centrilobular and paraseptal emphysema; imaging findings suggestive of underlying COPD. 4. Cholelithiasis.  EKG today personally reviewed, shows normal sinus rhythm, nonspecific changes, unchanged from prior.    ASSESSMENT:     1. Paroxysmal atrial fibrillation (HCC)   2. Precordial pain   3. Hyperlipidemia, unspecified hyperlipidemia type   4. Chronic anticoagulation   5. Coronary artery calcification seen on CT scan      PLAN:     1. Chest pain, artifact on stress test in April 2016. He is currently symptomatic , we will schedule a calcium score and coronary CTA, he underwent lung cancer screening in April of this year that showed significant coronary calcifications were sent for significant coronary artery disease.  2. Severe symptomatic microcytic anemia  - resolved, hemoglobin yesterday 14.9 in May 2018 we'll repeat now. - Restarted Eliquis, much better tolerated  then Xarelto, no bleeding.  3. PAF - Maintaining NSR.  CHADS2-VASc=3. On Eliquis.  4. HTN - repeated 130/74, we'll continue same management.  5. HL - Continue statin, tolerated,  we will check lipids and liver function test today.  6. LE edema - resolved, continue furosemide 20 mg daily PRN.  7. 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 Dizziness - cardiac ultrasound was normal in the past.  Obtain calcium score and coronary CTA, check CMP, CBC, TSH and lipids today.  Follow up in 6 months.  Tobias Alexander, MD  07/25/2017 11:07 AM    [facility] [address] [place], [place], [place]  [address] Phone: [phone]; Fax: [phone]

## 2017-08-18 ENCOUNTER — Other Ambulatory Visit: Payer: Self-pay | Admitting: Cardiology

## 2017-08-18 DIAGNOSIS — I480 Paroxysmal atrial fibrillation: Secondary | ICD-10-CM

## 2017-08-18 DIAGNOSIS — I10 Essential (primary) hypertension: Secondary | ICD-10-CM

## 2017-08-18 DIAGNOSIS — D509 Iron deficiency anemia, unspecified: Secondary | ICD-10-CM

## 2017-08-18 DIAGNOSIS — E785 Hyperlipidemia, unspecified: Secondary | ICD-10-CM

## 2017-08-20 ENCOUNTER — Telehealth: Payer: Self-pay | Admitting: *Deleted

## 2017-08-20 ENCOUNTER — Ambulatory Visit (HOSPITAL_COMMUNITY)
Admission: RE | Admit: 2017-08-20 | Discharge: 2017-08-20 | Disposition: A | Discharge status: Home / Self Care | Payer: BLUE CROSS/BLUE SHIELD | Source: Ambulatory Visit | Attending: Cardiology | Admitting: Cardiology

## 2017-08-20 DIAGNOSIS — E785 Hyperlipidemia, unspecified: Secondary | ICD-10-CM

## 2017-08-20 DIAGNOSIS — R079 Chest pain, unspecified: Secondary | ICD-10-CM | POA: Diagnosis not present

## 2017-08-20 DIAGNOSIS — Z7901 Long term (current) use of anticoagulants: Secondary | ICD-10-CM | POA: Diagnosis not present

## 2017-08-20 DIAGNOSIS — R072 Precordial pain: Secondary | ICD-10-CM | POA: Insufficient documentation

## 2017-08-20 DIAGNOSIS — I2510 Atherosclerotic heart disease of native coronary artery without angina pectoris: Secondary | ICD-10-CM | POA: Diagnosis not present

## 2017-08-20 DIAGNOSIS — I700 Atherosclerosis of aorta: Secondary | ICD-10-CM | POA: Diagnosis not present

## 2017-08-20 DIAGNOSIS — I480 Paroxysmal atrial fibrillation: Secondary | ICD-10-CM | POA: Diagnosis present

## 2017-08-20 MED ORDER — NITROGLYCERIN 0.4 MG SL SUBL
SUBLINGUAL_TABLET | SUBLINGUAL | Status: AC
  Filled 2017-08-20: qty 2

## 2017-08-20 MED ORDER — METOPROLOL TARTRATE 5 MG/5ML IV SOLN
5.0000 mg | Freq: Once | INTRAVENOUS | Status: AC
  Administered 2017-08-20: 5 mg via INTRAVENOUS
  Filled 2017-08-20: qty 5

## 2017-08-20 MED ORDER — ATORVASTATIN CALCIUM 80 MG PO TABS: 80.0000 mg | ORAL_TABLET | Freq: Every day | ORAL | 1 refills | Status: DC

## 2017-08-20 MED ORDER — METOPROLOL TARTRATE 5 MG/5ML IV SOLN
INTRAVENOUS | Status: AC
  Filled 2017-08-20: qty 5

## 2017-08-20 MED ORDER — AZITHROMYCIN 250 MG PO TABS: ORAL_TABLET | ORAL | 0 refills | Status: DC

## 2017-08-20 MED ORDER — NITROGLYCERIN 0.4 MG SL SUBL
0.8000 mg | SUBLINGUAL_TABLET | Freq: Once | SUBLINGUAL | Status: AC
Start: 2017-08-20 — End: 2017-08-20
  Administered 2017-08-20: 0.8 mg via SUBLINGUAL
  Filled 2017-08-20: qty 25

## 2017-08-20 MED ORDER — ALBUTEROL SULFATE HFA 108 (90 BASE) MCG/ACT IN AERS: INHALATION_SPRAY | RESPIRATORY_TRACT | 4 refills | Status: DC

## 2017-08-20 MED ORDER — IOPAMIDOL (ISOVUE-370) INJECTION 76%
INTRAVENOUS | Status: AC
  Administered 2017-08-20: 80 mL
  Filled 2017-08-20: qty 100

## 2017-08-20 NOTE — Telephone Encounter (Signed)
-----   Message from Lars MassonKatarina H Nelson, MD sent at 08/20/2017  4:25 PM EST ----- Lajoyce CornersIvy, his CT is significantly abnormal, please schedule him for a left heart catheterization at his convenience.  Also please send a prescription for a Z pack as his lung CT shows developing bronchopneumonia.

## 2017-08-20 NOTE — Telephone Encounter (Signed)
Dr Delton SeeNelson called and advised to go ahead and start the pt on antibiotics and schedule him to see a PA-C for early next week, to be reassessed for his pneumonia and schedule his cardiac cath at that visit.  Pt will also need an updated H & P for scheduling of cath, for he will be outside of that window.  Scheduled the pt a follow-up appt with Boyce MediciBrittany Simmons PA-C for next Tuesday 11/27 at 0800.  Pt aware to arrive 15 mins prior to this appt.  Confirmed the pharmacy of choice with the pt.  Advised the pt to make sure he's staying plenty hydrated.  Advised the pt to go ahead and start taking his antibiotic tonight.  Reordered the pts albuterol inhaler as well, for he is out of this refill.  Also advised the pt that we will increase his atorvastatin to 80 mg po daily.  Informed the pt that we will make arrangements for his cath, obtain blood work, and go over instructions at his OV with [place]ittany on 11/27.  Advised the pt that he will need to take 2 tabs of his z-pak tonight, as his first initial dose, then 1 tab po daily thereafter, until pack complete.  Pt verbalized understanding and agrees with this plan.

## 2017-08-20 NOTE — Telephone Encounter (Signed)
Message  Received: Today  Message Contents  Lars MassonNelson, Katarina H, MD  Loa SocksMartin, Raenah Murley M, LPN        Also please increase his atorvastatin to 80 mg po daily.  Thank you,  KN

## 2017-08-23 DIAGNOSIS — J189 Pneumonia, unspecified organism: Secondary | ICD-10-CM | POA: Insufficient documentation

## 2017-08-23 DIAGNOSIS — I2510 Atherosclerotic heart disease of native coronary artery without angina pectoris: Secondary | ICD-10-CM | POA: Insufficient documentation

## 2017-08-23 DIAGNOSIS — R079 Chest pain, unspecified: Secondary | ICD-10-CM | POA: Diagnosis not present

## 2017-08-23 NOTE — H&P (View-Only) (Signed)
Cardiology Office Note:    Date:  08/27/2017   ID:  Nicholas Nakayamaharles M Landry, DOB 07/06/1951, MRN [id number]  PCP:  [name], [name], MD  Cardiologist:  Dr. Tobias AlexanderKatarina Nelson  Referring MD: [name], [name], MD   Chief Complaint  Patient presents with  . Shortness of Breath    Abnormal CCTA    History of Present Illness:    Nicholas Camacho is a 66 y.o. male with a past medical history significant for HTN, HLD, COPD, PAF, OSA, small AAA and iliac aneurysm. CHADS2-VASc=3 (HTN, prior TIA).  He is on Eliquis  for anticoagulation (previously on Xarelto with GI bleeding).  The patient had profound fatigue and DOE in April 2017 and was found to have Hgb 5.2. [facility] and received 4U PRBCs and found to have esophagitis on EGD and colonoscopy did not reveal any abnormalities. He was treated with PPI and anticoagulation was held. His Hgb has remained stable and anticoagulation was restarted with Eliquis (switched from pervious Xarelto as better tolerated) in 06/2016.   At follow up on 07/25/2017 with [name] the patient noted some dizziness while driving and sometimes on exertion. He also was having some mild fatigue on exertion that was unusual for him. A Coronary CTA was done on 08/20/17 which revealed a calcium score of 3244 which is in the 99th percentile for age and sex matched control and severe diffuse almost circumferential calcifications in all three coronary arteries. There was moderate stenosis in LAD and RCA with a suspicion for a severe stenosis in the mid RCA. The study was affected by motion and was challenging due to secondary to heavy calcifications. [name] is recommending a cardiac cath which will be discussed today with the patient.  Today the patient presents with his wife to discuss his abnormal CCTA and follow up of his symptoms. He has been treated for pneumonia noted on CT 08/20/17  and states that he felt much better and was breathing better after only 2 days of  initiation of azithromycin. He still has occ cough worse in the late afternoon. He does not think his cough will interfere with cardaic cath. He has made plans for his cath on Friday. He has had no chest pain or DOE over the last week.  ____  Past Medical History:  Diagnosis Date  . Disturbance of skin sensation 06/24/2013  . Elevated cholesterol   . History of echocardiogram    Echo 4/17: EF 60-65%, no RWMA, Gr 2 DD  . Sleep disturbance, unspecified 06/24/2013    Past Surgical History:  Procedure Laterality Date  . ESOPHAGOGASTRODUODENOSCOPY N/A 01/08/2016   Procedure: ESOPHAGOGASTRODUODENOSCOPY (EGD);  Surgeon: [name], MD;  Location[facility] END[facility];  Service: Endoscopy;  Laterality: N/A;  . fractured pelvis    . INGUINAL HERNIA REPAIR Bilateral   . reconstruct left heel  2002  . TONSILLECTOMY      Current Medications: Current Meds  Medication Sig  . albuterol (PROAIR HFA) 108 (90 Base) MCG/ACT inhaler USE 2 INHALATIONS ORALLY   EVERY 6 HOURS AS NEEDED FORWHEEZING OR SHORTNESS OF   BREATH  . azithromycin (ZITHROMAX Z-PAK) 250 MG tablet Take 2 tabs by mouth today as your first initial dose, then take 1 tab by mouth daily thereafter, until pack complete.  [name]. CARTIA XT 120 MG 24 hr capsule TAKE 1 CAPSULE DAILY  . ELIQUIS 5 MG TABS tablet TAKE 1 TABLET TWICE A DAY  . fluticasone furoate-vilanterol (BREO ELLIPTA) 100-25 MCG/INH AEPB Inhale  1 puff into the lungs daily.  [name] losartan (COZAAR) 25 MG tablet TAKE 1 TABLET DAILY  . omeprazole (PRILOSEC) 20 MG capsule Take 20 mg by mouth daily as needed.   . Tiotropium Bromide Monohydrate (SPIRIVA RESPIMAT) 2.5 MCG/ACT AERS USE 2 INHALATIONS DAILY     Allergies:   Morphine and related   Social History   Socioeconomic History  . Marital status: Married    Spouse name: None  . Number of children: 2  . Years of education: COLLEGE  . Highest education level: None  Social Needs  . Financial resource strain: None  . Food insecurity -  worry: None  . Food insecurity - inability: None  . Transportation needs - medical: None  . Transportation needs - non-medical: None  Occupational History  . Occupation: pest control  Tobacco Use  . Smoking status: Former Smoker    Packs/day: 1.50    Years: 45.00    Pack years: 67.50    Types: Cigarettes    Last attempt to quit: 11/19/2015    Years since quitting: 1.7  . Smokeless tobacco: Never Used  . Tobacco comment: pt quit smoking!! 11/19/15  Substance and Sexual Activity  . Alcohol use: No    Alcohol/week: 0.0 oz  . Drug use: No  . Sexual activity: None  Other Topics Concern  . None  Social History Narrative  . None     Family History: The patient's family history includes Alcoholism in his sister; Diabetes in his mother; Drug abuse in his sister; Kidney failure in his father; Prostate cancer in his father. ROS:   Please see the history of present illness.     All other systems reviewed and are negative.  EKGs/Labs/Other Studies Reviewed:    The following studies were reviewed today:  Coronary CTA  08/20/2017 IMPRESSION: 1. Coronary calcium score of 3244. This was 34 percentile for age and sex matched control. 2. Normal coronary origin with right dominance. 3. Severe diffuse almost circumferential calcifications in all three coronary arteries. There is moderate stenosis in LAD and RCA with a suspicion for a severe stenosis in the mid RCA. The study is affected by motion and is challenging secondary to heavy calcifications. We will arrange for a left cardiac catheterization. 4. Mildly dilated pulmonary artery measuring 31 mm.  Echocardiogram 01/07/2016 Study Conclusions - Left ventricle: The cavity size was normal. Wall thickness was   normal. Systolic function was normal. The estimated ejection   fraction was in the range of 60% to 65%. Wall motion was normal;   there were no regional wall motion abnormalities. Features are   consistent with a pseudonormal  left ventricular filling pattern,   with concomitant abnormal relaxation and increased filling   pressure (grade 2 diastolic dysfunction).  EKG:  EKG is ordered today.  The ekg ordered today demonstrates sinus arrhythmia, 69 bpm, incomplete RBBB, no changes from previous  Recent Labs: 07/25/2017: ALT 14; BUN 13; Creatinine, Ser 0.94; Hemoglobin 15.0; Platelets 263; Potassium 4.5; Sodium 140; TSH 3.040   Recent Lipid Panel    Component Value Date/Time   CHOL 101 07/25/2017 1119   TRIG 35 07/25/2017 1119   HDL 32 (L) 07/25/2017 1119   CHOLHDL 3.2 07/25/2017 1119   CHOLHDL 3 01/11/2015 1248   VLDL 8.6 01/11/2015 1248   LDLCALC 62 07/25/2017 1119    Physical Exam:    VS:  BP 134/82   Pulse 69   Resp (!) 91   Ht 5\' 9"  (1.753 m)  Wt 205 lb (93 kg)   BMI 30.27 kg/m     Wt Readings from Last 3 Encounters:  08/27/17 205 lb (93 kg)  07/25/17 204 lb (92.5 kg)  05/28/17 201 lb 6.4 oz (91.4 kg)     Physical Exam  Constitutional: He is oriented to person, place, and time. He appears well-developed and well-nourished. No distress.  HENT:  Head: Normocephalic and atraumatic.  Neck: Normal range of motion. Neck supple. No JVD present.  Cardiovascular: Normal rate, regular rhythm and normal heart sounds. Exam reveals no gallop and no friction rub.  No murmur heard. Pulmonary/Chest: Effort normal and breath sounds normal. No respiratory distress. He has no wheezes. He has no rales.  Abdominal: Soft. Bowel sounds are normal. He exhibits no distension. There is no tenderness.  Musculoskeletal: Normal range of motion. He exhibits no edema or deformity.  Neurological: He is alert and oriented to person, place, and time.  Skin: Skin is warm and dry.  Psychiatric: He has a normal mood and affect. His behavior is normal. Judgment and thought content normal.    ASSESSMENT:    1. Paroxysmal atrial fibrillation (HCC)   2. Coronary artery disease of native artery of native heart with stable  angina pectoris (HCC)   3. Essential hypertension   4. Hyperlipidemia, unspecified hyperlipidemia type   5. Pneumonia due to infectious organism, unspecified laterality, unspecified part of lung    PLAN:    In order of problems listed above:  CAD -Pt with occ chest pressure and DOE. -Previous stress test in 12/2014 showed artifact. Low dose CT for lung cancer screening in 12/2016 showed significant coronary calcifications. Pt scheduled for CCTA. -Coronary CTA on 08/20/17 revealed a calcium score of 3244 which is in the 99th percentile for age and sex matched control and severe diffuse almost circumferential calcifications in all three coronary arteries. There was moderate stenosis in LAD and RCA with a suspicion for a severe stenosis in the mid RCA. The study was affected by motion and was challenging due to secondary to heavy calcifications.  -Currently the patient reports rare chest pressure, about 2 times per month with exertion. No chest pressure or dyspnea in the last week.  -Per recommendation by [name], will arrange for cardiac cath. Plan discussed with pt and his wife. Plan for Friday am.  -BMP, CBC, INR today -Pt instructed to hold Eliquis for 48hrs prior to cath, last dose Tuesday pm.  -Nitroglycerin prescription provided.  The proper use and anticipated side effects of nitroglycerine has been carefully explained.  If a single episode of chest pain is not relieved by one tablet, the patient will try another within 5 minutes; and if this doesn't relieve the pain, the patient is instructed to call 911 for transportation to an emergency department. -The patient understands that risks included but are not limited to stroke (1 in 1000), death (1 in 1000), kidney failure [usually temporary] (1 in 500), bleeding (1 in 200), allergic reaction [possibly serious] (1 in 200).   Pneumonia -Noted on CT. Pt started on azithromycin on 08/20/17. Breathing is significantly better. No chills/fever.  Has occ cough, more so in the late afternoon. He does not think it is significant enough to interfere with his cath.   PAF -Maintaining sinus rhythm on cartia XT 120 mg -CHADS2-VASc=3 (HTN, prior TIA). On Eliquis for stroke risk reduction. No unusual bleeding.  -Will hold Eliquis for 48 hrs prior to cath. Pt and wife instructed and verbalize understanding.  HTN -On Losartan 25, cartia XT 120. BP well controlled. Continue current therapy.  HLD -LDL 62 on 10/25, atorvastatin increased to 80 mg by Dr. Delton See in setting of CAD noted on CCTA.   AAA -last study in 11/2016: distal 3X3 cm, stable. Plan for follow up study in 1 year, 11/2017.   Hx anemia -in 12/2015 requiring 4 U PRBCs while on Xarelto, found to have esophagitis. -Now on Eliquis. Hgb stable since that time.   Medication Adjustments/Labs and Tests Ordered: Current medicines are reviewed at length with the patient today.  Concerns regarding medicines are outlined above. Labs and tests ordered and medication changes are outlined in the patient instructions below:  There are no Patient Instructions on file for this visit.   Signed, Berton Bon, NP  08/27/2017 8:17 AM    [facility]

## 2017-08-23 NOTE — Progress Notes (Signed)
Cardiology Office Note:    Date:  08/27/2017   ID:  Nicholas Camacho, DOB 07/06/1951, MRN [id number]  PCP:  [name], [name], MD  Cardiologist:  Dr. Tobias AlexanderKatarina Nelson  Referring MD: [name], [name], MD   Chief Complaint  Patient presents with  . Shortness of Breath    Abnormal CCTA    History of Present Illness:    Nicholas Camacho is a 66 y.o. male with a past medical history significant for HTN, HLD, COPD, PAF, OSA, small AAA and iliac aneurysm. CHADS2-VASc=3 (HTN, prior TIA).  He is on Eliquis  for anticoagulation (previously on Xarelto with GI bleeding).  The patient had profound fatigue and DOE in April 2017 and was found to have Hgb 5.2. [facility] and received 4U PRBCs and found to have esophagitis on EGD and colonoscopy did not reveal any abnormalities. He was treated with PPI and anticoagulation was held. His Hgb has remained stable and anticoagulation was restarted with Eliquis (switched from pervious Xarelto as better tolerated) in 06/2016.   At follow up on 07/25/2017 with [name] the patient noted some dizziness while driving and sometimes on exertion. He also was having some mild fatigue on exertion that was unusual for him. A Coronary CTA was done on 08/20/17 which revealed a calcium score of 3244 which is in the 99th percentile for age and sex matched control and severe diffuse almost circumferential calcifications in all three coronary arteries. There was moderate stenosis in LAD and RCA with a suspicion for a severe stenosis in the mid RCA. The study was affected by motion and was challenging due to secondary to heavy calcifications. [name] is recommending a cardiac cath which will be discussed today with the patient.  Today the patient presents with his wife to discuss his abnormal CCTA and follow up of his symptoms. He has been treated for pneumonia noted on CT 08/20/17  and states that he felt much better and was breathing better after only 2 days of  initiation of azithromycin. He still has occ cough worse in the late afternoon. He does not think his cough will interfere with cardaic cath. He has made plans for his cath on Friday. He has had no chest pain or DOE over the last week.  ____  Past Medical History:  Diagnosis Date  . Disturbance of skin sensation 06/24/2013  . Elevated cholesterol   . History of echocardiogram    Echo 4/17: EF 60-65%, no RWMA, Gr 2 DD  . Sleep disturbance, unspecified 06/24/2013    Past Surgical History:  Procedure Laterality Date  . ESOPHAGOGASTRODUODENOSCOPY N/A 01/08/2016   Procedure: ESOPHAGOGASTRODUODENOSCOPY (EGD);  Surgeon: [name], MD;  [facility];  Service: Endoscopy;  Laterality: N/A;  . fractured pelvis    . INGUINAL HERNIA REPAIR Bilateral   . reconstruct left heel  2002  . TONSILLECTOMY      Current Medications: Current Meds  Medication Sig  . albuterol (PROAIR HFA) 108 (90 Base) MCG/ACT inhaler USE 2 INHALATIONS ORALLY   EVERY 6 HOURS AS NEEDED FORWHEEZING OR SHORTNESS OF   BREATH  . azithromycin (ZITHROMAX Z-PAK) 250 MG tablet Take 2 tabs by mouth today as your first initial dose, then take 1 tab by mouth daily thereafter, until pack complete.  [name]. CARTIA XT 120 MG 24 hr capsule TAKE 1 CAPSULE DAILY  . ELIQUIS 5 MG TABS tablet TAKE 1 TABLET TWICE A DAY  . fluticasone furoate-vilanterol (BREO ELLIPTA) 100-25 MCG/INH AEPB Inhale  1 puff into the lungs daily.  [name] losartan (COZAAR) 25 MG tablet TAKE 1 TABLET DAILY  . omeprazole (PRILOSEC) 20 MG capsule Take 20 mg by mouth daily as needed.   . Tiotropium Bromide Monohydrate (SPIRIVA RESPIMAT) 2.5 MCG/ACT AERS USE 2 INHALATIONS DAILY     Allergies:   Morphine and related   Social History   Socioeconomic History  . Marital status: Married    Spouse name: None  . Number of children: 2  . Years of education: COLLEGE  . Highest education level: None  Social Needs  . Financial resource strain: None  . Food insecurity -  worry: None  . Food insecurity - inability: None  . Transportation needs - medical: None  . Transportation needs - non-medical: None  Occupational History  . Occupation: pest control  Tobacco Use  . Smoking status: Former Smoker    Packs/day: 1.50    Years: 45.00    Pack years: 67.50    Types: Cigarettes    Last attempt to quit: 11/19/2015    Years since quitting: 1.7  . Smokeless tobacco: Never Used  . Tobacco comment: pt quit smoking!! 11/19/15  Substance and Sexual Activity  . Alcohol use: No    Alcohol/week: 0.0 oz  . Drug use: No  . Sexual activity: None  Other Topics Concern  . None  Social History Narrative  . None     Family History: The patient's family history includes Alcoholism in his sister; Diabetes in his mother; Drug abuse in his sister; Kidney failure in his father; Prostate cancer in his father. ROS:   Please see the history of present illness.     All other systems reviewed and are negative.  EKGs/Labs/Other Studies Reviewed:    The following studies were reviewed today:  Coronary CTA  08/20/2017 IMPRESSION: 1. Coronary calcium score of 3244. This was 34 percentile for age and sex matched control. 2. Normal coronary origin with right dominance. 3. Severe diffuse almost circumferential calcifications in all three coronary arteries. There is moderate stenosis in LAD and RCA with a suspicion for a severe stenosis in the mid RCA. The study is affected by motion and is challenging secondary to heavy calcifications. We will arrange for a left cardiac catheterization. 4. Mildly dilated pulmonary artery measuring 31 mm.  Echocardiogram 01/07/2016 Study Conclusions - Left ventricle: The cavity size was normal. Wall thickness was   normal. Systolic function was normal. The estimated ejection   fraction was in the range of 60% to 65%. Wall motion was normal;   there were no regional wall motion abnormalities. Features are   consistent with a pseudonormal  left ventricular filling pattern,   with concomitant abnormal relaxation and increased filling   pressure (grade 2 diastolic dysfunction).  EKG:  EKG is ordered today.  The ekg ordered today demonstrates sinus arrhythmia, 69 bpm, incomplete RBBB, no changes from previous  Recent Labs: 07/25/2017: ALT 14; BUN 13; Creatinine, Ser 0.94; Hemoglobin 15.0; Platelets 263; Potassium 4.5; Sodium 140; TSH 3.040   Recent Lipid Panel    Component Value Date/Time   CHOL 101 07/25/2017 1119   TRIG 35 07/25/2017 1119   HDL 32 (L) 07/25/2017 1119   CHOLHDL 3.2 07/25/2017 1119   CHOLHDL 3 01/11/2015 1248   VLDL 8.6 01/11/2015 1248   LDLCALC 62 07/25/2017 1119    Physical Exam:    VS:  BP 134/82   Pulse 69   Resp (!) 91   Ht 5\' 9"  (1.753 m)  Wt 205 lb (93 kg)   BMI 30.27 kg/m     Wt Readings from Last 3 Encounters:  08/27/17 205 lb (93 kg)  07/25/17 204 lb (92.5 kg)  05/28/17 201 lb 6.4 oz (91.4 kg)     Physical Exam  Constitutional: He is oriented to person, place, and time. He appears well-developed and well-nourished. No distress.  HENT:  Head: Normocephalic and atraumatic.  Neck: Normal range of motion. Neck supple. No JVD present.  Cardiovascular: Normal rate, regular rhythm and normal heart sounds. Exam reveals no gallop and no friction rub.  No murmur heard. Pulmonary/Chest: Effort normal and breath sounds normal. No respiratory distress. He has no wheezes. He has no rales.  Abdominal: Soft. Bowel sounds are normal. He exhibits no distension. There is no tenderness.  Musculoskeletal: Normal range of motion. He exhibits no edema or deformity.  Neurological: He is alert and oriented to person, place, and time.  Skin: Skin is warm and dry.  Psychiatric: He has a normal mood and affect. His behavior is normal. Judgment and thought content normal.    ASSESSMENT:    1. Paroxysmal atrial fibrillation (HCC)   2. Coronary artery disease of native artery of native heart with stable  angina pectoris (HCC)   3. Essential hypertension   4. Hyperlipidemia, unspecified hyperlipidemia type   5. Pneumonia due to infectious organism, unspecified laterality, unspecified part of lung    PLAN:    In order of problems listed above:  CAD -Pt with occ chest pressure and DOE. -Previous stress test in 12/2014 showed artifact. Low dose CT for lung cancer screening in 12/2016 showed significant coronary calcifications. Pt scheduled for CCTA. -Coronary CTA on 08/20/17 revealed a calcium score of 3244 which is in the 99th percentile for age and sex matched control and severe diffuse almost circumferential calcifications in all three coronary arteries. There was moderate stenosis in LAD and RCA with a suspicion for a severe stenosis in the mid RCA. The study was affected by motion and was challenging due to secondary to heavy calcifications.  -Currently the patient reports rare chest pressure, about 2 times per month with exertion. No chest pressure or dyspnea in the last week.  -Per recommendation by [name], will arrange for cardiac cath. Plan discussed with pt and his wife. Plan for Friday am.  -BMP, CBC, INR today -Pt instructed to hold Eliquis for 48hrs prior to cath, last dose Tuesday pm.  -Nitroglycerin prescription provided.  The proper use and anticipated side effects of nitroglycerine has been carefully explained.  If a single episode of chest pain is not relieved by one tablet, the patient will try another within 5 minutes; and if this doesn't relieve the pain, the patient is instructed to call 911 for transportation to an emergency department. -The patient understands that risks included but are not limited to stroke (1 in 1000), death (1 in 1000), kidney failure [usually temporary] (1 in 500), bleeding (1 in 200), allergic reaction [possibly serious] (1 in 200).   Pneumonia -Noted on CT. Pt started on azithromycin on 08/20/17. Breathing is significantly better. No chills/fever.  Has occ cough, more so in the late afternoon. He does not think it is significant enough to interfere with his cath.   PAF -Maintaining sinus rhythm on cartia XT 120 mg -CHADS2-VASc=3 (HTN, prior TIA). On Eliquis for stroke risk reduction. No unusual bleeding.  -Will hold Eliquis for 48 hrs prior to cath. Pt and wife instructed and verbalize understanding.  HTN -On Losartan 25, cartia XT 120. BP well controlled. Continue current therapy.  HLD -LDL 62 on 10/25, atorvastatin increased to 80 mg by Dr. Delton See in setting of CAD noted on CCTA.   AAA -last study in 11/2016: distal 3X3 cm, stable. Plan for follow up study in 1 year, 11/2017.   Hx anemia -in 12/2015 requiring 4 U PRBCs while on Xarelto, found to have esophagitis. -Now on Eliquis. Hgb stable since that time.   Medication Adjustments/Labs and Tests Ordered: Current medicines are reviewed at length with the patient today.  Concerns regarding medicines are outlined above. Labs and tests ordered and medication changes are outlined in the patient instructions below:  There are no Patient Instructions on file for this visit.   Signed, Berton Bon, NP  08/27/2017 8:17 AM    [facility]

## 2017-08-27 ENCOUNTER — Encounter: Payer: Self-pay | Admitting: Cardiology

## 2017-08-27 ENCOUNTER — Encounter (INDEPENDENT_AMBULATORY_CARE_PROVIDER_SITE_OTHER): Payer: Self-pay

## 2017-08-27 ENCOUNTER — Ambulatory Visit: Payer: BLUE CROSS/BLUE SHIELD | Admitting: Cardiology

## 2017-08-27 VITALS — BP 134/82 | HR 69 | Resp 91 | Ht 69.0 in | Wt 205.0 lb

## 2017-08-27 DIAGNOSIS — I480 Paroxysmal atrial fibrillation: Secondary | ICD-10-CM | POA: Diagnosis not present

## 2017-08-27 DIAGNOSIS — E785 Hyperlipidemia, unspecified: Secondary | ICD-10-CM

## 2017-08-27 DIAGNOSIS — I25118 Atherosclerotic heart disease of native coronary artery with other forms of angina pectoris: Secondary | ICD-10-CM

## 2017-08-27 DIAGNOSIS — I2510 Atherosclerotic heart disease of native coronary artery without angina pectoris: Secondary | ICD-10-CM

## 2017-08-27 DIAGNOSIS — I10 Essential (primary) hypertension: Secondary | ICD-10-CM

## 2017-08-27 DIAGNOSIS — J189 Pneumonia, unspecified organism: Secondary | ICD-10-CM | POA: Diagnosis not present

## 2017-08-27 LAB — CBC WITH DIFFERENTIAL/PLATELET
BASOS ABS: 0 10*3/uL (ref 0.0–0.2)
Basos: 0 %
EOS (ABSOLUTE): 0.3 10*3/uL (ref 0.0–0.4)
Eos: 3 %
Hematocrit: 46.9 % (ref 37.5–51.0)
Hemoglobin: 15.4 g/dL (ref 13.0–17.7)
IMMATURE GRANS (ABS): 0 10*3/uL (ref 0.0–0.1)
IMMATURE GRANULOCYTES: 0 %
LYMPHS: 15 %
Lymphocytes Absolute: 1.5 10*3/uL (ref 0.7–3.1)
MCH: 29.2 pg (ref 26.6–33.0)
MCHC: 32.8 g/dL (ref 31.5–35.7)
MCV: 89 fL (ref 79–97)
Monocytes Absolute: 1 10*3/uL — ABNORMAL HIGH (ref 0.1–0.9)
Monocytes: 11 %
NEUTROS PCT: 71 %
Neutrophils Absolute: 6.8 10*3/uL (ref 1.4–7.0)
PLATELETS: 338 10*3/uL (ref 150–379)
RBC: 5.28 x10E6/uL (ref 4.14–5.80)
RDW: 14.3 % (ref 12.3–15.4)
WBC: 9.6 10*3/uL (ref 3.4–10.8)

## 2017-08-27 LAB — BASIC METABOLIC PANEL
BUN / CREAT RATIO: 16 (ref 10–24)
BUN: 15 mg/dL (ref 8–27)
CALCIUM: 9.6 mg/dL (ref 8.6–10.2)
CO2: 26 mmol/L (ref 20–29)
CREATININE: 0.92 mg/dL (ref 0.76–1.27)
Chloride: 102 mmol/L (ref 96–106)
GFR calc non Af Amer: 87 mL/min/{1.73_m2} (ref >59)
GFR, EST AFRICAN AMERICAN: 101 mL/min/{1.73_m2} (ref >59)
GLUCOSE: 92 mg/dL (ref 65–99)
Potassium: 4.8 mmol/L (ref 3.5–5.2)
Sodium: 141 mmol/L (ref 134–144)

## 2017-08-27 LAB — PROTIME-INR
INR: 1 (ref 0.8–1.2)
PROTHROMBIN TIME: 10.4 s (ref 9.1–12.0)

## 2017-08-27 MED ORDER — NITROGLYCERIN 0.4 MG SL SUBL: 0.4000 mg | SUBLINGUAL_TABLET | SUBLINGUAL | 2 refills | Status: DC | PRN

## 2017-08-27 NOTE — Patient Instructions (Signed)
Medication Instructions: Your physician recommends that you continue on your current medications as directed. Please refer to the Current Medication list given to you today.   -- RX SENT IN FOR SUBLINGUAL NITROGLYCERIN--   Labwork: Your physician has recommended that you have lab work today: BMET, CBC, PT/INR   Procedures/Testing: Your physician has requested that you have a cardiac catheterization. Cardiac catheterization is used to diagnose and/or treat various heart conditions. Doctors may recommend this procedure for a number of different reasons. The most common reason is to evaluate chest pain. Chest pain can be a symptom of coronary artery disease (CAD), and cardiac catheterization can show whether plaque is narrowing or blocking your heart's arteries. This procedure is also used to evaluate the valves, as well as measure the blood flow and oxygen levels in different parts of your heart. For further information please visit [email or web address]. Please follow instruction sheet, as given   Follow-Up: Your physician recommends that you schedule a follow-up appointment in: 2-4 WEEKS with [name] or an APP on Dr. Lindaann SloughNelson's team.   Any Additional Special Instructions Will Be Listed Below (If Applicable).    [facility] OFF[facility] [address], [address] [place] [address] Dept: [phone] Loc: [phone]  Melrose NakayamaCharles M Plitt  08/27/2017  You are scheduled for a Cardiac Catheterization on Friday, November 30 with Dr. Verne Carrowhristopher McAlhany.  1. [facility]: [address] [place], [place] [address] at 8:30 AM (two hours before your procedure to ensure your preparation). Free valet parking service is available.   Special note: Every effort is made to have your procedure done on time. Please understand that emergencies sometimes delay  scheduled procedures.  2. Diet: Do not eat or drink anything after midnight prior to your procedure except sips of water to take medications.  3. Labs: Your labs will be drawn in office today  4. Take your last dose of Eliquis on Tuesday (Nov 27) night  On the morning of your procedure, take your Aspirin and any morning medicines NOT listed above.  You may use sips of water.  5. Plan for one night stay--bring personal belongings. 6. Bring a current list of your medications and current insurance cards. 7. You MUST have a responsible person to drive you home. 8. Someone MUST be with you the first 24 hours after you arrive home or your discharge will be delayed. 9. Please wear clothes that are easy to get on and off and wear slip-on shoes.  Thank you for allowing us to care for you!   -- Mobile Invasive Cardiovascular services    If you need a refill on your cardiac medications before your next appointment, please call your pharmacy.

## 2017-08-29 ENCOUNTER — Telehealth: Payer: Self-pay

## 2017-08-29 NOTE — Telephone Encounter (Signed)
Spoke with Pt wife. [facility] for:  08/30/2017 @ 1030 Verified arrival time and place:  NT @ 0800 Confirmed AM meds to be taken pre-cath with sip of water: Eliquis-hold-last dose 08/27/2017 Take ASA Confirmed patient has responsible person to drive home post procedure and observe patient for 24 hours:  yes Addl concerns:  none

## 2017-08-30 ENCOUNTER — Encounter (HOSPITAL_COMMUNITY): Payer: Self-pay | Admitting: Cardiovascular Disease

## 2017-08-30 ENCOUNTER — Ambulatory Visit (HOSPITAL_COMMUNITY)
Admission: RE | Admit: 2017-08-30 | Discharge: 2017-08-30 | Disposition: A | Discharge status: Home / Self Care | Payer: BLUE CROSS/BLUE SHIELD | Source: Ambulatory Visit | Attending: Cardiovascular Disease | Admitting: Cardiovascular Disease

## 2017-08-30 ENCOUNTER — Encounter (HOSPITAL_COMMUNITY)
Admission: RE | Disposition: A | Discharge status: Home / Self Care | Payer: Self-pay | Source: Ambulatory Visit | Attending: Cardiovascular Disease

## 2017-08-30 DIAGNOSIS — I2584 Coronary atherosclerosis due to calcified coronary lesion: Secondary | ICD-10-CM | POA: Diagnosis not present

## 2017-08-30 DIAGNOSIS — I714 Abdominal aortic aneurysm, without rupture: Secondary | ICD-10-CM | POA: Insufficient documentation

## 2017-08-30 DIAGNOSIS — Z7901 Long term (current) use of anticoagulants: Secondary | ICD-10-CM | POA: Insufficient documentation

## 2017-08-30 DIAGNOSIS — I25118 Atherosclerotic heart disease of native coronary artery with other forms of angina pectoris: Secondary | ICD-10-CM | POA: Diagnosis not present

## 2017-08-30 DIAGNOSIS — Z8673 Personal history of transient ischemic attack (TIA), and cerebral infarction without residual deficits: Secondary | ICD-10-CM | POA: Diagnosis not present

## 2017-08-30 DIAGNOSIS — G4733 Obstructive sleep apnea (adult) (pediatric): Secondary | ICD-10-CM | POA: Diagnosis not present

## 2017-08-30 DIAGNOSIS — Z87891 Personal history of nicotine dependence: Secondary | ICD-10-CM | POA: Diagnosis not present

## 2017-08-30 DIAGNOSIS — I480 Paroxysmal atrial fibrillation: Secondary | ICD-10-CM | POA: Diagnosis not present

## 2017-08-30 DIAGNOSIS — J440 Chronic obstructive pulmonary disease with acute lower respiratory infection: Secondary | ICD-10-CM | POA: Insufficient documentation

## 2017-08-30 DIAGNOSIS — I10 Essential (primary) hypertension: Secondary | ICD-10-CM | POA: Insufficient documentation

## 2017-08-30 DIAGNOSIS — Z7951 Long term (current) use of inhaled steroids: Secondary | ICD-10-CM | POA: Insufficient documentation

## 2017-08-30 DIAGNOSIS — E7800 Pure hypercholesterolemia, unspecified: Secondary | ICD-10-CM | POA: Diagnosis not present

## 2017-08-30 DIAGNOSIS — I2510 Atherosclerotic heart disease of native coronary artery without angina pectoris: Secondary | ICD-10-CM

## 2017-08-30 HISTORY — PX: LEFT HEART CATH AND CORONARY ANGIOGRAPHY: CATH118249

## 2017-08-30 SURGERY — LEFT HEART CATH AND CORONARY ANGIOGRAPHY
Anesthesia: LOCAL

## 2017-08-30 MED ORDER — SODIUM CHLORIDE 0.9% FLUSH: 3.0000 mL | Freq: Two times a day (BID) | INTRAVENOUS | Status: DC

## 2017-08-30 MED ORDER — SODIUM CHLORIDE 0.9% FLUSH: 3.0000 mL | INTRAVENOUS | Status: DC | PRN

## 2017-08-30 MED ORDER — HEPARIN SODIUM (PORCINE) 1000 UNIT/ML IJ SOLN
INTRAMUSCULAR | Status: DC | PRN
  Administered 2017-08-30: 5000 [IU] via INTRAVENOUS

## 2017-08-30 MED ORDER — LIDOCAINE HCL (PF) 1 % IJ SOLN
INTRAMUSCULAR | Status: DC | PRN
  Administered 2017-08-30: 3 mL

## 2017-08-30 MED ORDER — SODIUM CHLORIDE 0.9 % WEIGHT BASED INFUSION
3.0000 mL/kg/h | INTRAVENOUS | Status: AC
  Administered 2017-08-30: 3 mL/kg/h via INTRAVENOUS

## 2017-08-30 MED ORDER — FENTANYL CITRATE (PF) 100 MCG/2ML IJ SOLN
INTRAMUSCULAR | Status: DC | PRN
  Administered 2017-08-30: 50 ug via INTRAVENOUS

## 2017-08-30 MED ORDER — SODIUM CHLORIDE 0.9 % IV SOLN: 250.0000 mL | INTRAVENOUS | Status: DC | PRN

## 2017-08-30 MED ORDER — VERAPAMIL HCL 2.5 MG/ML IV SOLN
INTRAVENOUS | Status: AC
  Filled 2017-08-30: qty 2

## 2017-08-30 MED ORDER — MIDAZOLAM HCL 2 MG/2ML IJ SOLN
INTRAMUSCULAR | Status: AC
  Filled 2017-08-30: qty 2

## 2017-08-30 MED ORDER — SODIUM CHLORIDE 0.9 % IV SOLN: INTRAVENOUS | Status: AC

## 2017-08-30 MED ORDER — HEPARIN (PORCINE) IN NACL 2-0.9 UNIT/ML-% IJ SOLN
INTRAMUSCULAR | Status: AC
  Filled 2017-08-30: qty 1000

## 2017-08-30 MED ORDER — IOPAMIDOL (ISOVUE-370) INJECTION 76%
INTRAVENOUS | Status: AC
  Filled 2017-08-30: qty 50

## 2017-08-30 MED ORDER — IOPAMIDOL (ISOVUE-370) INJECTION 76%
INTRAVENOUS | Status: AC
  Filled 2017-08-30: qty 100

## 2017-08-30 MED ORDER — ASPIRIN 81 MG PO CHEW: 81.0000 mg | CHEWABLE_TABLET | ORAL | Status: DC

## 2017-08-30 MED ORDER — HEPARIN (PORCINE) IN NACL 2-0.9 UNIT/ML-% IJ SOLN
INTRAMUSCULAR | Status: AC | PRN
  Administered 2017-08-30: 1000 mL

## 2017-08-30 MED ORDER — HEPARIN (PORCINE) IN NACL 2-0.9 UNIT/ML-% IJ SOLN
INTRAMUSCULAR | Status: DC | PRN
  Administered 2017-08-30: 10:00:00 via INTRA_ARTERIAL

## 2017-08-30 MED ORDER — MIDAZOLAM HCL 2 MG/2ML IJ SOLN
INTRAMUSCULAR | Status: DC | PRN
  Administered 2017-08-30: 2 mg via INTRAVENOUS

## 2017-08-30 MED ORDER — LIDOCAINE HCL (PF) 1 % IJ SOLN
INTRAMUSCULAR | Status: AC
Start: 2017-08-30 — End: ?
  Filled 2017-08-30: qty 30

## 2017-08-30 MED ORDER — SODIUM CHLORIDE 0.9 % WEIGHT BASED INFUSION: 1.0000 mL/kg/h | INTRAVENOUS | Status: DC

## 2017-08-30 MED ORDER — ASPIRIN 81 MG PO CHEW
CHEWABLE_TABLET | ORAL | Status: AC
  Filled 2017-08-30: qty 1

## 2017-08-30 MED ORDER — IOPAMIDOL (ISOVUE-370) INJECTION 76%
INTRAVENOUS | Status: DC | PRN
  Administered 2017-08-30: 105 mL via INTRA_ARTERIAL

## 2017-08-30 MED ORDER — HEPARIN SODIUM (PORCINE) 1000 UNIT/ML IJ SOLN
INTRAMUSCULAR | Status: AC
Start: 2017-08-30 — End: ?
  Filled 2017-08-30: qty 1

## 2017-08-30 MED ORDER — FENTANYL CITRATE (PF) 100 MCG/2ML IJ SOLN
INTRAMUSCULAR | Status: AC
  Filled 2017-08-30: qty 2

## 2017-08-30 SURGICAL SUPPLY — 10 items

## 2017-08-30 NOTE — Discharge Instructions (Signed)

## 2017-08-30 NOTE — Interval H&P Note (Signed)
History and Physical Interval Note:  08/30/2017 9:01 AM  Nicholas Nakayamaharles M Camacho  has presented today for cardiac cath with the diagnosis of CAD, angina. The various methods of treatment have been discussed with the patient and family. After consideration of risks, benefits and other options for treatment, the patient has consented to  Procedure(s): LEFT HEART CATH AND CORONARY ANGIOGRAPHY (N/A) as a surgical intervention .  The patient's history has been reviewed, patient examined, no change in status, stable for surgery.  I have reviewed the patient's chart and labs.  Questions were answered to the patient's satisfaction.   Cath Lab Visit (complete for each Cath Lab visit)  Clinical Evaluation Leading to the Procedure:   ACS: No.  Non-ACS:    Anginal Classification: CCS II  Anti-ischemic medical therapy: No Therapy  Non-Invasive Test Results: No non-invasive testing performed  Prior CABG: No previous CABG          Nicholas Camacho

## 2017-09-11 ENCOUNTER — Encounter: Payer: Self-pay | Admitting: Physician Assistant

## 2017-09-17 ENCOUNTER — Encounter: Payer: Self-pay | Admitting: Physician Assistant

## 2017-09-17 ENCOUNTER — Ambulatory Visit: Payer: BLUE CROSS/BLUE SHIELD | Admitting: Physician Assistant

## 2017-09-17 VITALS — BP 130/62 | HR 72 | Ht 69.0 in | Wt 203.8 lb

## 2017-09-17 DIAGNOSIS — I2510 Atherosclerotic heart disease of native coronary artery without angina pectoris: Secondary | ICD-10-CM

## 2017-09-17 DIAGNOSIS — I480 Paroxysmal atrial fibrillation: Secondary | ICD-10-CM

## 2017-09-17 DIAGNOSIS — F17210 Nicotine dependence, cigarettes, uncomplicated: Secondary | ICD-10-CM

## 2017-09-17 DIAGNOSIS — I25118 Atherosclerotic heart disease of native coronary artery with other forms of angina pectoris: Secondary | ICD-10-CM

## 2017-09-17 DIAGNOSIS — E785 Hyperlipidemia, unspecified: Secondary | ICD-10-CM

## 2017-09-17 DIAGNOSIS — I10 Essential (primary) hypertension: Secondary | ICD-10-CM

## 2017-09-17 DIAGNOSIS — I209 Angina pectoris, unspecified: Secondary | ICD-10-CM

## 2017-09-17 MED ORDER — NICOTINE 7 MG/24HR TD PT24: 7.0000 mg | MEDICATED_PATCH | Freq: Every day | TRANSDERMAL | 0 refills | Status: DC

## 2017-09-17 MED ORDER — NICOTINE 14 MG/24HR TD PT24: 14.0000 mg | MEDICATED_PATCH | Freq: Every day | TRANSDERMAL | 0 refills | Status: DC

## 2017-09-17 MED ORDER — NICOTINE 21 MG/24HR TD PT24
21.0000 mg | MEDICATED_PATCH | Freq: Every day | TRANSDERMAL | 0 refills | Status: DC
Start: 2017-09-17 — End: 2017-12-23

## 2017-09-17 NOTE — Patient Instructions (Signed)
Medication Instructions:  1. START Nicotine Patches as directed per pharmacy directions. Rx has been sent to your pharmacy  Labwork: October 29, 2017: Liver/Lipids  Testing/Procedures: None ordered  Follow-Up: Your physician recommends that you schedule a follow-up appointment December 23, 2017 @ 9:40 am with Dr. Johnell ComingsNelson  You are being referred for smoking cessation. Referral has been placed and they will call you with an appointment.  Any Other Special Instructions Will Be Listed Below (If Applicable).     If you need a refill on your cardiac medications before your next appointment, please call your pharmacy.

## 2017-09-17 NOTE — Progress Notes (Signed)
Cardiology Office Note    Date:  09/17/2017   ID:  Nicholas Camacho, DOB 09/29/1951, MRN [id number]  PCP:  [name], [name], MD  Cardiologist: [name]  Chief Complaint  Patient presents with  . Follow-up    History of Present Illness:  Nicholas Camacho is a 66 y.o. male with history of PAF chads BASC equals 3 on Eliquis (had GI bleed on Xarelto), hypertension, HLD, COPD, OSA, small AAA and iliac aneurysm.  Coronary CTA 08/20/17 for mild fatigue on exertion showed calcium score of 3244 which was the 99th percentile for age and sex.  Moderate stenosis in the LAD and RCA with suspicion for severe stenosis in the mid RCA.  Cardiac catheterization 08/30/17 showed moderate nonobstructive calcific stenosis in the RCA with mild nonobstructive calcific stenosis in the LAD and circumflex normal LV function.  Medical management was recommended as well as smoking cessation.  See below for complete details.  Patient comes in today for post hospital follow-up.  He denies chest pain.  He does get short of breath with going upstairs but is also wheezing.  He has cut back on his cigarettes to just under a pack a day.  He notes he needs to quit.  Past Medical History:  Diagnosis Date  . Disturbance of skin sensation 06/24/2013  . Elevated cholesterol   . History of echocardiogram    Echo 4/17: EF 60-65%, no RWMA, Gr 2 DD  . Sleep disturbance, unspecified 06/24/2013    Past Surgical History:  Procedure Laterality Date  . ESOPHAGOGASTRODUODENOSCOPY N/A 01/08/2016   Procedure: ESOPHAGOGASTRODUODENOSCOPY (EGD);  Surgeon: [name], MD;  [facility];  Service: Endoscopy;  Laterality: N/A;  . fractured pelvis    . INGUINAL HERNIA REPAIR Bilateral   . LEFT HEART CATH AND CORONARY ANGIOGRAPHY N/A 08/30/2017   Procedure: LEFT HEART CATH AND CORONARY ANGIOGRAPHY;  Surgeon: Kathleene HazelMcAlhany, Christopher D, MD;  Location: [facility] INVASIVE CV LAB;  Service: Cardiovascular;  Laterality: N/A;  .  reconstruct left heel  2002  . TONSILLECTOMY      Current Medications: Current Meds  Medication Sig  . albuterol (PROAIR HFA) 108 (90 Base) MCG/ACT inhaler USE 2 INHALATIONS ORALLY   EVERY 6 HOURS AS NEEDED FORWHEEZING OR SHORTNESS OF   BREATH  . aspirin EC 81 MG tablet Take 81 mg by mouth daily.  [name]. atorvastatin (LIPITOR) 80 MG tablet Take 80 mg by mouth daily.  [name]. CARTIA XT 120 MG 24 hr capsule TAKE 1 CAPSULE DAILY  . ELIQUIS 5 MG TABS tablet TAKE 1 TABLET TWICE A DAY  . fluticasone furoate-vilanterol (BREO ELLIPTA) 100-25 MCG/INH AEPB Inhale 1 puff into the lungs daily.  [name]. losartan (COZAAR) 25 MG tablet TAKE 1 TABLET DAILY  . nitroGLYCERIN (NITROSTAT) 0.4 MG SL tablet Place 1 tablet (0.4 mg total) under the tongue every 5 (five) minutes as needed for chest pain.  [name]. omeprazole (PRILOSEC) 20 MG capsule Take 20 mg by mouth daily as needed.   . Tiotropium Bromide Monohydrate (SPIRIVA RESPIMAT) 2.5 MCG/ACT AERS USE 2 INHALATIONS DAILY     Allergies:   Morphine and related   Social History   Socioeconomic History  . Marital status: Married    Spouse name: None  . Number of children: 2  . Years of education: COLLEGE  . Highest education level: None  Social Needs  . Financial resource strain: None  . Food insecurity - worry: None  . Food insecurity - inability: None  . Transportation needs -  medical: None  . Transportation needs - non-medical: None  Occupational History  . Occupation: pest control  Tobacco Use  . Smoking status: Former Smoker    Packs/day: 1.50    Years: 45.00    Pack years: 67.50    Types: Cigarettes    Last attempt to quit: 11/19/2015    Years since quitting: 1.8  . Smokeless tobacco: Never Used  . Tobacco comment: pt quit smoking!! 11/19/15  Substance and Sexual Activity  . Alcohol use: No    Alcohol/week: 0.0 oz  . Drug use: No  . Sexual activity: None  Other Topics Concern  . None  Social History Narrative  . None     Family History:  The  patient's family history includes Alcoholism in his sister; Diabetes in his mother; Drug abuse in his sister; Kidney failure in his father; Prostate cancer in his father.   ROS:   Please see the history of present illness.    Review of Systems  Cardiovascular: Positive for dyspnea on exertion.  Respiratory: Positive for wheezing.    All other systems reviewed and are negative.   PHYSICAL EXAM:   VS:  BP 130/62   Pulse 72   Ht 5\' 9"  (1.753 m)   Wt 203 lb 12.8 oz (92.4 kg)   SpO2 92%   BMI 30.10 kg/m   Physical Exam  GEN: Well nourished, well developed, in no acute distress  Neck: no JVD, carotid bruits, or masses Cardiac: Irregular irregular respiratory: Decreased breath sounds with scattered wheezes GI: soft, nontender, nondistended, + BS Ext: Right arm at cath site without hematoma or hemorrhage, lower extremities without cyanosis, clubbing, or edema, Good distal pulses bilaterally Neuro:  Alert and Oriented x 3 Psych: euthymic mood, full affect  Wt Readings from Last 3 Encounters:  09/17/17 203 lb 12.8 oz (92.4 kg)  08/30/17 205 lb (93 kg)  08/27/17 205 lb (93 kg)      Studies/Labs Reviewed:   EKG:  EKG is not ordered today.    Recent Labs: 07/25/2017: ALT 14; TSH 3.040 08/27/2017: BUN 15; Creatinine, Ser 0.92; Hemoglobin 15.4; Platelets 338; Potassium 4.8; Sodium 141   Lipid Panel    Component Value Date/Time   CHOL 101 07/25/2017 1119   TRIG 35 07/25/2017 1119   HDL 32 (L) 07/25/2017 1119   CHOLHDL 3.2 07/25/2017 1119   CHOLHDL 3 01/11/2015 1248   VLDL 8.6 01/11/2015 1248   LDLCALC 62 07/25/2017 1119    Additional studies/ records that were reviewed today include:  Iliac cath 11/30/18Conclusion      The left ventricular systolic function is normal.  LV end diastolic pressure is normal.  The left ventricular ejection fraction is 55-65% by visual estimate.  There is no mitral valve regurgitation.  Prox RCA lesion is 40% stenosed.  Prox RCA to Mid  RCA lesion is 50% stenosed.  Mid RCA-1 lesion is 30% stenosed.  Mid RCA-2 lesion is 50% stenosed.  Prox Cx lesion is 40% stenosed.  Ost LAD to Prox LAD lesion is 30% stenosed.   1. Moderate non-obstructive, calcific stenoses in the RCA 2. Mild non-obstructive, calcific stenoses in the LAD and Circumflex 3. Normal LV systolic function   Recommendations: Medical management of CAD. Tobacco cessation.       Coronary CTIMPRESSION: 1. Coronary calcium score of 3244. This was 40 percentile for age and sex matched control.   2. Normal coronary origin with right dominance.   3. Severe diffuse almost circumferential calcifications in all  three coronary arteries. There is moderate stenosis in LAD and RCA with a suspicion for a severe stenosis in the mid RCA. The study is affected by motion and is challenging secondary to heavy calcifications. We will arrange for a left cardiac catheterization.   4. Mildly dilated pulmonary artery measuring 31 mm.     Electronically Signed   By: Tobias AlexanderKatarina  Nelson   On: 08/20/2017 16:36      ASSESSMENT:    1. Coronary artery disease of native artery of native heart with stable angina pectoris (HCC)   2. Hyperlipidemia, unspecified hyperlipidemia type   3. Paroxysmal atrial fibrillation (HCC)   4. Essential hypertension   5. Cigarette smoker      PLAN:  In order of problems listed above:  CAD moderate nonobstructive in the RCA, mild nonobstructive in the LAD and circumflex.  Risk factor modification essential including smoking cessation.  Hyperlipidemia Lipitor recently increased.  We will check fasting lipid panel and LFTs in 6 weeks  PAF on Eliquis doing well- had a bleed on Xarelto  Essential hypertension controlled  Cigarette smoker long discussion about the importance of smoking cessation with the patient.  He will try the nicotine patches and refer to [facility] for smoking cessation.   Medication Adjustments/Labs and Tests  Ordered: Current medicines are reviewed at length with the patient today.  Concerns regarding medicines are outlined above.  Medication changes, Labs and Tests ordered today are listed in the Patient Instructions below. There are no Patient Instructions on file for this visit.   Elson ClanSigned, Michele Lenze, PA-C  09/17/2017 9:33 AM    [facility] [address], [place], [place]  [address] Phone: [phone]; Fax: [phone]

## 2017-10-08 ENCOUNTER — Telehealth: Payer: Self-pay | Admitting: *Deleted

## 2017-10-08 NOTE — Telephone Encounter (Signed)
MAILED OUT INFO TO PT FOR SMOKING CESSATION. Per Omar PersonSharon Ferguson we have to give the pt the information so that the pt can call themselves.

## 2017-10-29 ENCOUNTER — Other Ambulatory Visit: Payer: Medicare Other | Admitting: *Deleted

## 2017-10-29 DIAGNOSIS — I25118 Atherosclerotic heart disease of native coronary artery with other forms of angina pectoris: Secondary | ICD-10-CM

## 2017-10-29 DIAGNOSIS — E785 Hyperlipidemia, unspecified: Secondary | ICD-10-CM | POA: Diagnosis not present

## 2017-10-29 LAB — HEPATIC FUNCTION PANEL
ALT: 17 IU/L (ref 0–44)
AST: 15 IU/L (ref 0–40)
Albumin: 4.3 g/dL (ref 3.6–4.8)
Alkaline Phosphatase: 130 IU/L — ABNORMAL HIGH (ref 39–117)
Bilirubin Total: 0.4 mg/dL (ref 0.0–1.2)
Bilirubin, Direct: 0.11 mg/dL (ref 0.00–0.40)
Total Protein: 6.7 g/dL (ref 6.0–8.5)

## 2017-10-29 LAB — LIPID PANEL
CHOLESTEROL TOTAL: 84 mg/dL — AB (ref 100–199)
Chol/HDL Ratio: 2.6 ratio (ref 0.0–5.0)
HDL: 32 mg/dL — ABNORMAL LOW (ref >39)
LDL Calculated: 44 mg/dL (ref 0–99)
TRIGLYCERIDES: 42 mg/dL (ref 0–149)
VLDL Cholesterol Cal: 8 mg/dL (ref 5–40)

## 2017-11-18 ENCOUNTER — Other Ambulatory Visit: Payer: Self-pay

## 2017-11-18 MED ORDER — ATORVASTATIN CALCIUM 80 MG PO TABS: 80.0000 mg | ORAL_TABLET | Freq: Every day | ORAL | 2 refills | Status: DC

## 2017-12-02 DIAGNOSIS — J449 Chronic obstructive pulmonary disease, unspecified: Secondary | ICD-10-CM | POA: Diagnosis not present

## 2017-12-23 ENCOUNTER — Ambulatory Visit: Payer: Medicare Other | Admitting: Cardiology

## 2017-12-23 ENCOUNTER — Encounter: Payer: Self-pay | Admitting: Cardiology

## 2017-12-23 VITALS — BP 120/64 | HR 43 | Ht 69.0 in | Wt 202.8 lb

## 2017-12-23 DIAGNOSIS — I10 Essential (primary) hypertension: Secondary | ICD-10-CM | POA: Diagnosis not present

## 2017-12-23 DIAGNOSIS — E785 Hyperlipidemia, unspecified: Secondary | ICD-10-CM | POA: Diagnosis not present

## 2017-12-23 DIAGNOSIS — I25118 Atherosclerotic heart disease of native coronary artery with other forms of angina pectoris: Secondary | ICD-10-CM | POA: Diagnosis not present

## 2017-12-23 DIAGNOSIS — R0609 Other forms of dyspnea: Secondary | ICD-10-CM

## 2017-12-23 DIAGNOSIS — R0600 Dyspnea, unspecified: Secondary | ICD-10-CM

## 2017-12-23 MED ORDER — LORATADINE 10 MG PO TABS: 10.0000 mg | ORAL_TABLET | Freq: Every day | ORAL | 4 refills | Status: DC

## 2017-12-23 MED ORDER — ALBUTEROL SULFATE HFA 108 (90 BASE) MCG/ACT IN AERS
INHALATION_SPRAY | RESPIRATORY_TRACT | 4 refills | Status: DC
Start: 2017-12-23 — End: 2018-03-07

## 2017-12-23 MED ORDER — LOSARTAN POTASSIUM 50 MG PO TABS: 50.0000 mg | ORAL_TABLET | Freq: Every day | ORAL | 3 refills | Status: DC

## 2017-12-23 NOTE — Patient Instructions (Signed)
Medication Instructions:   STOP TAKING DILTIAZEM NOW  STOP TAKING ASPRIN NOW  INCREASE YOUR LOSARTAN TO 50 MG ONCE DAILY  START TAKING CLARITIN 10 MG ONCE DAILY  USE ALBUTEROL INHALER AS NEEDED FOR SOB, COUGH, AND WHEEZING--PLEASE FOLLOW INSTRUCTIONS ON THE LABEL AS DIRECTED     Follow-Up:  Your physician wants you to follow-up in: 6 MONTHS WITH DR Johnell ComingsNELSON You will receive a reminder letter in the mail two months in advance. If you don't receive a letter, please call our office to schedule the follow-up appointment.        If you need a refill on your cardiac medications before your next appointment, please call your pharmacy.

## 2017-12-23 NOTE — Progress Notes (Signed)
Cardiology Office Note    Date:  12/23/2017   ID:  Nicholas Camacho, DOB 07-17-1951, MRN [id number]  PCP:  Farris Has, MD  Cardiologist: Dr. Delton See  No chief complaint on file.   History of Present Illness:  Nicholas Camacho is a 67 y.o. male with history of PAF chads BASC equals 3 on Eliquis (had GI bleed on Xarelto), hypertension, HLD, COPD, OSA, small AAA and iliac aneurysm.  Coronary CTA 08/20/17 for mild fatigue on exertion showed calcium score of 3244 which was the 99th percentile for age and sex.  Moderate stenosis in the LAD and RCA with suspicion for severe stenosis in the mid RCA.  Cardiac catheterization 08/30/17 showed moderate nonobstructive calcific stenosis in the RCA with mild nonobstructive calcific stenosis in the LAD and circumflex normal LV function.  Medical management was recommended as well as smoking cessation.  See below for complete details.  Patient comes in today for post hospital follow-up.  He denies chest pain.  He does get short of breath with going upstairs but is also wheezing.  He has cut back on his cigarettes to just under a pack a day.  He notes he needs to quit.  12/23/2017 - the patient is coming complaining of dyspnea on exertion especially when walking stairs. He's been compliant with his medications and doesn't have any side effects and no bleeding other what than when he cuts himself. He is about to retire next months. He admits that he hasn't been able to exercise at all and thinks that he wouldn't be able to walk a full mile.He denies any chest pain.  Past Medical History:  Diagnosis Date  . Disturbance of skin sensation 06/24/2013  . Elevated cholesterol   . History of echocardiogram    Echo 4/17: EF 60-65%, no RWMA, Gr 2 DD  . Sleep disturbance, unspecified 06/24/2013    Past Surgical History:  Procedure Laterality Date  . ESOPHAGOGASTRODUODENOSCOPY N/A 01/08/2016   Procedure: ESOPHAGOGASTRODUODENOSCOPY (EGD);  Surgeon: Carman Ching, MD;  Location: [facility] ENDOSCOPY;  Service: Endoscopy;  Laterality: N/A;  . fractured pelvis    . INGUINAL HERNIA REPAIR Bilateral   . LEFT HEART CATH AND CORONARY ANGIOGRAPHY N/A 08/30/2017   Procedure: LEFT HEART CATH AND CORONARY ANGIOGRAPHY;  Surgeon: Kathleene Hazel, MD;  Location: [facility] INVASIVE CV LAB;  Service: Cardiovascular;  Laterality: N/A;  . reconstruct left heel  2002  . TONSILLECTOMY      Current Medications: Current Meds  Medication Sig  . apixaban (ELIQUIS) 5 MG TABS tablet Take 5 mg by mouth 2 (two) times daily.  [name] atorvastatin (LIPITOR) 80 MG tablet Take 1 tablet (80 mg total) by mouth daily.  . fluticasone furoate-vilanterol (BREO ELLIPTA) 100-25 MCG/INH AEPB Inhale 1 puff into the lungs daily.  . nicotine (NICODERM CQ - DOSED IN MG/24 HOURS) 14 mg/24hr patch Place 1 patch (14 mg total) onto the skin daily.  . nicotine (NICODERM CQ - DOSED IN MG/24 HR) 7 mg/24hr patch Place 1 patch (7 mg total) onto the skin daily.  [name] omeprazole (PRILOSEC) 20 MG capsule Take 20 mg by mouth daily as needed (REFLUX).   . Tiotropium Bromide Monohydrate (SPIRIVA RESPIMAT) 2.5 MCG/ACT AERS USE 2 INHALATIONS DAILY  . [DISCONTINUED] albuterol (PROAIR HFA) 108 (90 Base) MCG/ACT inhaler USE 2 INHALATIONS ORALLY   EVERY 6 HOURS AS NEEDED FORWHEEZING OR SHORTNESS OF   BREATH  . [DISCONTINUED] diltiazem (CARDIZEM CD) 120 MG 24 hr capsule Take 120 mg by mouth  daily.  . [DISCONTINUED] losartan (COZAAR) 25 MG tablet Take 25 mg by mouth daily.     Allergies:   Morphine and related   Social History   Socioeconomic History  . Marital status: Married    Spouse name: Not on file  . Number of children: 2  . Years of education: COLLEGE  . Highest education level: Not on file  Occupational History  . Occupation: Furniture conservator/restorer  Social Needs  . Financial resource strain: Not on file  . Food insecurity:    Worry: Not on file    Inability: Not on file  . Transportation needs:    Medical:  Not on file    Non-medical: Not on file  Tobacco Use  . Smoking status: Former Smoker    Packs/day: 1.50    Years: 45.00    Pack years: 67.50    Types: Cigarettes    Last attempt to quit: 11/19/2015    Years since quitting: 2.0  . Smokeless tobacco: Never Used  . Tobacco comment: pt quit smoking!! 11/19/15  Substance and Sexual Activity  . Alcohol use: No    Alcohol/week: 0.0 oz  . Drug use: No  . Sexual activity: Not on file  Lifestyle  . Physical activity:    Days per week: Not on file    Minutes per session: Not on file  . Stress: Not on file  Relationships  . Social connections:    Talks on phone: Not on file    Gets together: Not on file    Attends religious service: Not on file    Active member of club or organization: Not on file    Attends meetings of clubs or organizations: Not on file    Relationship status: Not on file  Other Topics Concern  . Not on file  Social History Narrative  . Not on file     Family History:  The patient's family history includes Alcoholism in his sister; Diabetes in his mother; Drug abuse in his sister; Kidney failure in his father; Prostate cancer in his father.   ROS:   Please see the history of present illness.    Review of Systems  Cardiovascular: Positive for dyspnea on exertion.  Respiratory: Positive for wheezing.    All other systems reviewed and are negative.   PHYSICAL EXAM:   VS:  BP 120/64   Pulse (!) 43   Ht 5\' 9"  (1.753 m)   Wt 202 lb 12.8 oz (92 kg)   SpO2 93%   BMI 29.95 kg/m   Physical Exam  GEN: Well nourished, well developed, in no acute distress  Neck: no JVD, carotid bruits, or masses Cardiac: Irregular irregular respiratory: Decreased breath sounds with scattered wheezes GI: soft, nontender, nondistended, + BS Ext: Right arm at cath site without hematoma or hemorrhage, lower extremities without cyanosis, clubbing, or edema, Good distal pulses bilaterally Neuro:  Alert and Oriented x 3 Psych: euthymic  mood, full affect  Wt Readings from Last 3 Encounters:  12/23/17 202 lb 12.8 oz (92 kg)  09/17/17 203 lb 12.8 oz (92.4 kg)  08/30/17 205 lb (93 kg)      Studies/Labs Reviewed:   EKG:  EKG is not ordered today.    Recent Labs: 07/25/2017: TSH 3.040 08/27/2017: BUN 15; Creatinine, Ser 0.92; Hemoglobin 15.4; Platelets 338; Potassium 4.8; Sodium 141 10/29/2017: ALT 17   Lipid Panel    Component Value Date/Time   CHOL 84 (L) 10/29/2017 0801   TRIG 42 10/29/2017  0801   HDL 32 (L) 10/29/2017 0801   CHOLHDL 2.6 10/29/2017 0801   CHOLHDL 3 01/11/2015 1248   VLDL 8.6 01/11/2015 1248   LDLCALC 44 10/29/2017 0801    Additional studies/ records that were reviewed today include:  Iliac cath 11/30/18Conclusion      The left ventricular systolic function is normal.  LV end diastolic pressure is normal.  The left ventricular ejection fraction is 55-65% by visual estimate.  There is no mitral valve regurgitation.  Prox RCA lesion is 40% stenosed.  Prox RCA to Mid RCA lesion is 50% stenosed.  Mid RCA-1 lesion is 30% stenosed.  Mid RCA-2 lesion is 50% stenosed.  Prox Cx lesion is 40% stenosed.  Ost LAD to Prox LAD lesion is 30% stenosed.   1. Moderate non-obstructive, calcific stenoses in the RCA 2. Mild non-obstructive, calcific stenoses in the LAD and Circumflex 3. Normal LV systolic function   Recommendations: Medical management of CAD. Tobacco cessation.       Coronary CTIMPRESSION: 1. Coronary calcium score of 3244. This was 49 percentile for age and sex matched control.   2. Normal coronary origin with right dominance.   3. Severe diffuse almost circumferential calcifications in all three coronary arteries. There is moderate stenosis in LAD and RCA with a suspicion for a severe stenosis in the mid RCA. The study is affected by motion and is challenging secondary to heavy calcifications. We will arrange for a left cardiac catheterization.   4. Mildly dilated  pulmonary artery measuring 31 mm.     Electronically Signed   By: Tobias Alexander   On: 08/20/2017 16:36      ASSESSMENT:    1. Coronary artery disease of native artery of native heart with stable angina pectoris (HCC)   2. Hyperlipidemia, unspecified hyperlipidemia type   3. Essential hypertension      PLAN:  In order of problems listed above:  CAD moderate nonobstructive in the RCA, mild nonobstructive in the LAD and circumflex.  Risk factor modification essential including smoking cessation. He is wearing nicotine patches, he is advised to start regular exercise, starting with walking and potentially joining silver sneakers.  Hyperlipidemia Lipitor recently increased.  We will check fasting lipid panel and LFTs in 6 weeks  PAF on Eliquis doing well- had a bleed on Xarelto, he is bradycardic ll discontinue diltiazem  Essential hypertension controlled, I will increase losartan to 50 mg daily given on discontinuing diltiazem.  Cigarette smoker long discussion about the importance of smoking cessation with the patient.  He will try the nicotine patches and refer to [facility] smoking cessation.  Medication Adjustments/Labs and Tests Ordered: Current medicines are reviewed at length with the patient today.  Concerns regarding medicines are outlined above.  Medication changes, Labs and Tests ordered today are listed in the Patient Instructions below. Patient Instructions  Medication Instructions:   STOP TAKING DILTIAZEM NOW  STOP TAKING ASPRIN NOW  INCREASE YOUR LOSARTAN TO 50 MG ONCE DAILY  START TAKING CLARITIN 10 MG ONCE DAILY  USE ALBUTEROL INHALER AS NEEDED FOR SOB, COUGH, AND WHEEZING--PLEASE FOLLOW INSTRUCTIONS ON THE LABEL AS DIRECTED     Follow-Up:  Your physician wants you to follow-up in: 6 MONTHS WITH DR Johnell Comings will receive a reminder letter in the mail two months in advance. If you don't receive a letter, please call our office to schedule the  follow-up appointment.        If you need a refill on your cardiac  medications before your next appointment, please call your pharmacy.      Signed, Tobias AlexanderKatarina Clements Toro, MD  12/23/2017 10:30 AM    [facility] [address], [place], [place]  [address] Phone: [phone]; Fax: [phone]

## 2017-12-30 ENCOUNTER — Telehealth: Payer: Self-pay

## 2018-01-02 ENCOUNTER — Telehealth: Payer: Self-pay | Admitting: *Deleted

## 2018-01-02 DIAGNOSIS — J449 Chronic obstructive pulmonary disease, unspecified: Secondary | ICD-10-CM | POA: Diagnosis not present

## 2018-01-02 NOTE — Telephone Encounter (Signed)
Prior authorization done for patients PROAIR HFA sent through covermymeds.

## 2018-01-06 NOTE — Telephone Encounter (Signed)
We received a form via fax from [employer] concerning the pts Proair. There are questions on the form that can better be answered by the pts pulmonologist (Dr Max Fickleouglas [name]) and I am concerned that if cardiology fills this form out we will be denied and the pt has been taking this medication for years.  I have faxed the form to Dr Ulyses JarredMcQuaid's office with a note written on the cover letter asking them to address.

## 2018-02-01 DIAGNOSIS — J449 Chronic obstructive pulmonary disease, unspecified: Secondary | ICD-10-CM | POA: Diagnosis not present

## 2018-02-14 ENCOUNTER — Ambulatory Visit (INDEPENDENT_AMBULATORY_CARE_PROVIDER_SITE_OTHER)
Admission: RE | Admit: 2018-02-14 | Discharge: 2018-02-14 | Disposition: A | Discharge status: Home / Self Care | Payer: Medicare Other | Source: Ambulatory Visit | Attending: Acute Care | Admitting: Acute Care

## 2018-02-14 ENCOUNTER — Telehealth: Payer: Self-pay

## 2018-02-14 DIAGNOSIS — Z87891 Personal history of nicotine dependence: Secondary | ICD-10-CM | POA: Diagnosis not present

## 2018-02-14 DIAGNOSIS — Z122 Encounter for screening for malignant neoplasm of respiratory organs: Secondary | ICD-10-CM

## 2018-02-14 MED ORDER — DOXYCYCLINE HYCLATE 100 MG PO TABS: 100.0000 mg | ORAL_TABLET | Freq: Two times a day (BID) | ORAL | 0 refills | Status: DC

## 2018-02-14 NOTE — Telephone Encounter (Signed)
Pt called back CB [phone]

## 2018-02-14 NOTE — Telephone Encounter (Signed)
Jane from radiology called to give report on patient. Will send to SG   IMPRESSION: 1. Lung-RADS 0, incomplete. New patchy consolidation at the right lung base with extensive new patchy tree-in-bud opacities in the right greater than left lungs, favor nonspecific infectious or inflammatory bronchopneumonia. Recommend repeat lung cancer screening chest CT in 1-2 months after antimicrobial therapy. Additional lung cancer screening CT images/or comparison to prior chest CT examinations is needed. 2. Left main and 3 vessel coronary atherosclerosis.

## 2018-02-14 NOTE — Telephone Encounter (Signed)
Called patient unable to reach left message to give us a call back.

## 2018-02-14 NOTE — Telephone Encounter (Signed)
Please call patient and let him know his low dose CT was inconclusive due to infection.  We will need to repeat the scan in 1-2 months. Please send in Doxycycline 100 mg twice daily x 7 days. Have patient take the medication, and then [facility], please schedule repeat LDCT in 6 weeks.Thanks so much.

## 2018-02-14 NOTE — Telephone Encounter (Signed)
Called and spoke with patient, he is aware and verbalized understanding. Medication sent in.    Will route to [facility]

## 2018-02-17 NOTE — Telephone Encounter (Signed)
Order placed for 6 week f/u LDCT.  Nothing further needed.

## 2018-02-20 DIAGNOSIS — I10 Essential (primary) hypertension: Secondary | ICD-10-CM | POA: Diagnosis not present

## 2018-02-20 DIAGNOSIS — Z1211 Encounter for screening for malignant neoplasm of colon: Secondary | ICD-10-CM | POA: Diagnosis not present

## 2018-02-20 DIAGNOSIS — Z0000 Encounter for general adult medical examination without abnormal findings: Secondary | ICD-10-CM | POA: Diagnosis not present

## 2018-02-20 DIAGNOSIS — E785 Hyperlipidemia, unspecified: Secondary | ICD-10-CM | POA: Diagnosis not present

## 2018-03-04 DIAGNOSIS — J449 Chronic obstructive pulmonary disease, unspecified: Secondary | ICD-10-CM | POA: Diagnosis not present

## 2018-03-06 ENCOUNTER — Telehealth: Payer: Self-pay | Admitting: Pulmonary Disease

## 2018-03-06 NOTE — Progress Notes (Signed)
@Patient  ID: Nicholas Camacho, male    DOB: [birth year], 67 y.o.   MRN: [id number]  Chief Complaint  Patient presents with  . Acute Visit    chest congestion, productive cough with green mucous, increased SOB and wheezing x last 10 days, Has been taking mucinex and vicks dayquil and benadryl. Finished doxyxcyline 5/23.     Referring provider: Farris Has, MD  HPI:  Synopsis: Former patient of Dr. Shelle Iron has COPD.  He quit smoking in 11/2015. PFT's 10/2013:  FEV1 2.38 (72%), ratio 58, no BD response, TLC normal, DLCO 72% Had significant weight from fluid retention gain while on a prednisone taper for a COPD exacerbation. 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  Patient reports never wearing a respirator mask as "but they scare the customers".   Recent [facility] Encounters:   02/14/2018-telephone encounter-low-dose CT scan inconclusive due to new patchy consolidation of right lung base, repeat low-dose CT in 1 to 2 months after antimicrobial therapy 02/14/2018- telephone encounter-doxycycline 100 mg twice daily x7 days we will repeat low-dose CT in 6 weeks    05/28/2017-office visit-[name] Treated for COPD exacerbation, Augmentin, did not do prednisone as patient wants to avoid it, continue using your Dulera then we can switch her to [facility] which she can take once daily, keep taking her Spiriva, follow-up in 4 to 6 months  02/26/2017-office visit- TP Follow-up from recent COPD and AR flare, continue Dulera and Spiriva, follow-up with Dr. Kendrick Fries, patient declined O2, keep follow-up with primary care  Tests:   Imaging:  02/14/2018-low-dose CT scan-inconclusive due to new consolidation in the right lung base 02/11/17-chest x-ray- hyperinflation with emphysematous changes, coarse interstitial opacities likely chronic 02/08/2017-low-dose CT scan- lung RADS 2 as, benign appearance and behavior, repeat low-dose CT in 12 months, mild diffuse bronchial wall  thickening with mild centrilobular and paraseptal emphysema  Cardiac:  01/07/2016-echocardiogram-LV ejection fraction 60 to 65%, grade 2 diastolic dysfunction  Labs:   Micro:   Chart Review:    03/07/18 Acute visit Pleasant 67 year old male patient presents today in office with increased shortness of breath over the past 3 to 4 weeks.  Patient also reporting productive cough.  Patient has not been seen in our office since August 2018.  Patient reports that he ran out of his Breo Ellipta inhaler about a month ago.  Patient also reporting adherence to Spiriva, as well as his rescue inhaler which he has had to use 3-4 times a day over the past few weeks.  Patient was treated with doxycycline via telephone encounter last month when his low-dose CT screening showed an infiltrate.  Patient reports that symptoms have persisted since then.  Patient denies any fevers.  Patient does have the ability to check oxygen levels at home but forgets to do so often.  Patient says that when he does check it is usually above 90%.  Patient does report though that sometimes his shortness of breath is pronounced when he is sleeping, but he will use his rescue inhaler and SPO2 monitor downstairs.   Allergies  Allergen Reactions  . Morphine And Related     HALLUCINATIONS    Immunization History  Administered Date(s) Administered  . Influenza,inj,Quad PF,6+ Mos 06/20/2015, 11/02/2016, 07/25/2017  . Pneumococcal Conjugate-13 10/06/2013    Past Medical History:  Diagnosis Date  . Disturbance of skin sensation 06/24/2013  . Elevated cholesterol   . History of echocardiogram    Echo 4/17: EF 60-65%, no RWMA, Gr 2 DD  .  Sleep disturbance, unspecified 06/24/2013    Tobacco History: Social History   Tobacco Use  Smoking Status Former Smoker  . Packs/day: 1.50  . Years: 45.00  . Pack years: 67.50  . Types: Cigarettes  . Last attempt to quit: 11/19/2015  . Years since quitting: 2.2  Smokeless Tobacco Never  Used  Tobacco Comment   pt quit smoking!! 11/19/15   Counseling given: Yes Comment: pt quit smoking!! 11/19/15 Congratulated patient on continuing to not smoke.  Outpatient Encounter Medications as of 03/07/2018  Medication Sig  . apixaban (ELIQUIS) 5 MG TABS tablet Take 5 mg by mouth 2 (two) times daily.  [name]. atorvastatin (LIPITOR) 80 MG tablet Take 1 tablet (80 mg total) by mouth daily.  . fluticasone furoate-vilanterol (BREO ELLIPTA) 100-25 MCG/INH AEPB Inhale 1 puff into the lungs daily.  [name]. loratadine (CLARITIN) 10 MG tablet Take 1 tablet (10 mg total) by mouth daily.  [name]. losartan (COZAAR) 50 MG tablet Take 1 tablet (50 mg total) by mouth daily.  . nicotine (NICODERM CQ - DOSED IN MG/24 HOURS) 14 mg/24hr patch Place 1 patch (14 mg total) onto the skin daily.  . nicotine (NICODERM CQ - DOSED IN MG/24 HR) 7 mg/24hr patch Place 1 patch (7 mg total) onto the skin daily.  [name]. omeprazole (PRILOSEC) 20 MG capsule Take 20 mg by mouth daily as needed (REFLUX).   . Tiotropium Bromide Monohydrate (SPIRIVA RESPIMAT) 2.5 MCG/ACT AERS USE 2 INHALATIONS DAILY  . [DISCONTINUED] albuterol (PROAIR HFA) 108 (90 Base) MCG/ACT inhaler USE 2 INHALATIONS ORALLY   EVERY 6 HOURS AS NEEDED FORWHEEZING OR SHORTNESS OF   BREATH  . albuterol (PROVENTIL HFA;VENTOLIN HFA) 108 (90 Base) MCG/ACT inhaler Inhale 2 puffs into the lungs every 6 (six) hours as needed for wheezing or shortness of breath.  [name]. amoxicillin-clavulanate (AUGMENTIN) 875-125 MG tablet Take 1 tablet by mouth 2 (two) times daily.  [name]. doxycycline (VIBRA-TABS) 100 MG tablet Take 1 tablet (100 mg total) by mouth 2 (two) times daily. (Patient not taking: Reported on 03/07/2018)  . Fluticasone-Umeclidin-Vilant (TRELEGY ELLIPTA) 100-62.5-25 MCG/INH AEPB Inhale 1 puff into the lungs daily.  . nitroGLYCERIN (NITROSTAT) 0.4 MG SL tablet Place 1 tablet (0.4 mg total) under the tongue every 5 (five) minutes as needed for chest pain.   Facility-Administered Encounter  Medications as of 03/07/2018  Medication  . ipratropium-albuterol (DUONEB) 0.5-2.5 (3) MG/3ML nebulizer solution 3 mL     Review of Systems  Constitutional: +fatigue   No  weight loss, night sweats,  fevers, chills HEENT:   No headaches,  Difficulty swallowing,  Tooth/dental problems, or  Sore throat, No sneezing, itching, ear ache, nasal congestion, post nasal drip  CV: No chest pain,  orthopnea, PND, swelling in lower extremities, anasarca, dizziness, palpitations, syncope  GI: No heartburn, indigestion, abdominal pain, nausea, vomiting, diarrhea, change in bowel habits, loss of appetite, bloody stools Resp: +sob with exertion, even at rest, cough - green mucous, occasional sob at night sleeping, wheezing  Skin: no rash, lesions, no skin changes. GU: no dysuria, change in color of urine, no urgency or frequency.  No flank pain, no hematuria  MS:  No joint pain or swelling.  No decreased range of motion.  No back pain. Psych:  No change in mood or affect. No depression or anxiety.  No memory loss.   Physical Exam  BP 124/80   Pulse 85   Temp 98.2 F (36.8 C) (Oral)   Ht 5\' 10"  (1.778 m)   Wt 202  lb (91.6 kg)   SpO2 90%   BMI 28.98 kg/m    Wt Readings from Last 3 Encounters:  03/07/18 202 lb (91.6 kg)  12/23/17 202 lb 12.8 oz (92 kg)  09/17/17 203 lb 12.8 oz (92.4 kg)    GEN: A/Ox3; pleasant , NAD, well nourished    HEENT:  [place]/AT,  EACs-clear, TMs-wnl, NOSE-clear, THROAT- +post nasal drip   NECK:  Supple w/ fair ROM; no JVD; normal carotid impulses w/o bruits; no thyromegaly or nodules palpated; no lymphadenopathy.    RESP: + Expiratory wheezes, deep breath elicits cough, air movement in all lobes no accessory muscle use, no dullness to percussion  CARD:  RRR, no m/r/g, no peripheral edema, pulses intact, no cyanosis or clubbing.  GI:   Soft & nt; nml bowel sounds; no organomegaly or masses detected.   Musco: Warm bil, no deformities or joint swelling noted.   Neuro:  alert, no focal deficits noted.    Skin: Warm, no lesions or rashes  Breathing treatment office today Respiratory assessment posttreatment- reduced expiratory wheezing but still present, better air movement in all lobes  Lab Results:  CBC    Component Value Date/Time   WBC 9.6 08/27/2017 0918   WBC 14.2 Repeated and verified X2. (H) 02/11/2017 1508   RBC 5.28 08/27/2017 0918   RBC 5.08 02/11/2017 1508   HGB 15.4 08/27/2017 0918   HCT 46.9 08/27/2017 0918   PLT 338 08/27/2017 0918   MCV 89 08/27/2017 0918   MCH 29.2 08/27/2017 0918   MCH 28.4 03/30/2016 0951   MCHC 32.8 08/27/2017 0918   MCHC 32.6 02/11/2017 1508   RDW 14.3 08/27/2017 0918   LYMPHSABS 1.5 08/27/2017 0918   MONOABS 1.4 (H) 02/11/2017 1508   EOSABS 0.3 08/27/2017 0918   BASOSABS 0.0 08/27/2017 0918    BMET    Component Value Date/Time   NA 141 08/27/2017 0918   K 4.8 08/27/2017 0918   CL 102 08/27/2017 0918   CO2 26 08/27/2017 0918   GLUCOSE 92 08/27/2017 0918   GLUCOSE 115 (H) 02/11/2017 1508   BUN 15 08/27/2017 0918   CREATININE 0.92 08/27/2017 0918   CREATININE 1.20 03/30/2016 0951   CALCIUM 9.6 08/27/2017 0918   GFRNONAA 87 08/27/2017 0918   GFRAA 101 08/27/2017 0918    BNP    Component Value Date/Time   BNP 54.0 12/22/2015 1042    ProBNP    Component Value Date/Time   PROBNP 55.0 01/04/2016 1204    Imaging: Ct Chest Lung Ca Screen Low Dose W/o Cm  Result Date: 02/14/2018 CLINICAL DATA:  67 year old asymptomatic male former smoker with 67.5 pack-year smoking history, quit smoking 2 years prior. EXAM: CT CHEST WITHOUT CONTRAST LOW-DOSE FOR LUNG CANCER SCREENING TECHNIQUE: Multidetector CT imaging of the chest was performed following the standard protocol without IV contrast. COMPARISON:  02/08/2017 screening chest CT. FINDINGS: Cardiovascular: Normal heart size. No significant pericardial effusion/thickening. Left main and 3 vessel coronary atherosclerosis. Atherosclerotic nonaneurysmal  thoracic aorta. Normal caliber pulmonary arteries. Mediastinum/Nodes: No discrete thyroid nodules. Unremarkable esophagus. No pathologically enlarged axillary, mediastinal or hilar lymph nodes, noting limited sensitivity for the detection of hilar adenopathy on this noncontrast study. Lungs/Pleura: No pneumothorax. No pleural effusion. Mild centrilobular emphysema with diffuse bronchial wall thickening. There is new patchy consolidation in the basilar right lower lobe. There is new extensive patchy tree-in-bud opacity throughout both lungs, involving the right lung greater than left lung, most prominent in the right lower lobe. There is near  occlusion of the central right lower lobe bronchi by nonspecific material. Previously visualized tiny granuloma and pulmonary nodule demonstrate no significant change. Upper abdomen: Stable partially visualized coarse calcification of the pancreatic tail with associated parenchymal atrophy. Musculoskeletal: No aggressive appearing focal osseous lesions. Symmetric mild gynecomastia, stable. Moderate thoracic spondylosis. IMPRESSION: 1. Lung-RADS 0, incomplete. New patchy consolidation at the right lung base with extensive new patchy tree-in-bud opacities in the right greater than left lungs, favor nonspecific infectious or inflammatory bronchopneumonia. Recommend repeat lung cancer screening chest CT in 1-2 months after antimicrobial therapy. Additional lung cancer screening CT images/or comparison to prior chest CT examinations is needed. 2. Left main and 3 vessel coronary atherosclerosis. Aortic Atherosclerosis (ICD10-I70.0) and Emphysema (ICD10-J43.9). These results will be called to the ordering clinician or representative by the Radiologist Assistant, and communication documented in the PACS or zVision Dashboard. Electronically Signed   By: Delbert Phenix M.D.   On: 02/14/2018 15:10     Assessment & Plan:   Pleasant 67 year old patient today presenting today with acute  on chronic respiratory failure and a COPD exacerbation.  Patient has been without his Breo for 1 month now.  Patient will be started on trilogy today he has been given a one-month supply.  Patient informed to follow-up with our office after completing the first 2 weeks sample to let us know how is doing on this medication.  So that we can place a order to his pharmacy.  Patient very concerned with the formularies and the cost of his medications.  It appears that trilogy is formulary as well as would only be a $45 co-pay versus the current treatment which is Breo and Spiriva which is a $90 co-pay monthly.  We also switched rescue inhaler to Ventolin as [employer] is not formulary.  Emphasized the importance with patient that he needs to follow-up with our office if he is ever running low or is unable to afford his medications.  His these are controller therapies that need to be taken daily as prescribed in order to manage his COPD and breathing.  Patient agrees  We will do chest x-ray today >>> Potentially may need high-res or CT without contrast in place of the low-dose CT screening per radiology >>> I will follow-up with that about low-dose CT screening department and Dr. Kendrick Fries regarding this  We will treat with Augmentin, will not do prednisone as patient has had a reaction to this in the past causing significant fluid retention.  DuoNeb in office.  Will have to follow-up with low-dose CT screening clinic to reschedule after completing this treatment.  Formularly list copied and to be scanned into the chart.   Chronic respiratory failure with hypoxia (HCC) To check oxygen levels at home Remember goal oxygen levels are 90% or higher If you are maintaining below 90% or symptomatically short of breath and not responding to your rescue inhaler or your controller therapies then you need to call our office and make an appointment  Follow-up in 6 weeks to see Dr. Kendrick Fries   COPD GOLD II Trelegy sample  provided today >>> Patient to call if sample is working well >>> Call when you finish her first inhaler to update you as with your status   Will bring back in 6 weeks  Exacerbation today will cover with Augmentin, will not do prednisone as patient has had a significant reaction with fluid retention in the past.  Will monitor for now  Coral Ceo, NP 03/07/2018

## 2018-03-06 NOTE — Telephone Encounter (Signed)
Called and spoke to pt. Pt reports of prod cough with green mucus, increased sob, wheezing & chest discomfort x8-10d. Pt has been scheduled for acute visit with Elisha HeadlandBrian Mack on 03/07/18. Nothing further is needed.

## 2018-03-07 ENCOUNTER — Encounter: Payer: Self-pay | Admitting: Pulmonary Disease

## 2018-03-07 ENCOUNTER — Ambulatory Visit: Payer: Medicare Other | Admitting: Pulmonary Disease

## 2018-03-07 ENCOUNTER — Ambulatory Visit (INDEPENDENT_AMBULATORY_CARE_PROVIDER_SITE_OTHER)
Admission: RE | Admit: 2018-03-07 | Discharge: 2018-03-07 | Disposition: A | Discharge status: Home / Self Care | Payer: Medicare Other | Source: Ambulatory Visit | Attending: Pulmonary Disease | Admitting: Pulmonary Disease

## 2018-03-07 DIAGNOSIS — J9611 Chronic respiratory failure with hypoxia: Secondary | ICD-10-CM

## 2018-03-07 DIAGNOSIS — J449 Chronic obstructive pulmonary disease, unspecified: Secondary | ICD-10-CM | POA: Diagnosis not present

## 2018-03-07 DIAGNOSIS — R05 Cough: Secondary | ICD-10-CM | POA: Diagnosis not present

## 2018-03-07 DIAGNOSIS — R0602 Shortness of breath: Secondary | ICD-10-CM | POA: Diagnosis not present

## 2018-03-07 MED ORDER — AMOXICILLIN-POT CLAVULANATE 875-125 MG PO TABS: 1.0000 | ORAL_TABLET | Freq: Two times a day (BID) | ORAL | 0 refills | Status: DC

## 2018-03-07 MED ORDER — FLUTICASONE-UMECLIDIN-VILANT 100-62.5-25 MCG/INH IN AEPB: 1.0000 | INHALATION_SPRAY | Freq: Every day | RESPIRATORY_TRACT | 0 refills | Status: DC

## 2018-03-07 MED ORDER — ALBUTEROL SULFATE HFA 108 (90 BASE) MCG/ACT IN AERS
2.0000 | INHALATION_SPRAY | Freq: Four times a day (QID) | RESPIRATORY_TRACT | 6 refills | Status: DC | PRN
Start: 2018-03-07 — End: 2018-08-22

## 2018-03-07 MED ORDER — IPRATROPIUM-ALBUTEROL 0.5-2.5 (3) MG/3ML IN SOLN: 3.0000 mL | Freq: Four times a day (QID) | RESPIRATORY_TRACT | Status: DC

## 2018-03-07 NOTE — Progress Notes (Signed)
Your chest x-ray results have come back.  No further changes in your plan of care at this time.  Potentially may benefit from an noncontrast CT which she can discuss with Dr. Kendrick FriesMcquaid at your next appointment.  Continue with your current treatment therapies as we discussed in your office visit today.  Resume using your maintenance inhalers (trelogy started today).  Please follow-up with our office if your symptoms do not improve.  If they do improve we will see you back for your scheduled follow-up appointment.  Elisha HeadlandBrian Stayce Delancy FNP

## 2018-03-07 NOTE — Assessment & Plan Note (Signed)
To check oxygen levels at home Remember goal oxygen levels are 90% or higher If you are maintaining below 90% or symptomatically short of breath and not responding to your rescue inhaler or your controller therapies then you need to call our office and make an appointment  Follow-up in 6 weeks to see Dr. Kendrick FriesMcquaid

## 2018-03-07 NOTE — Patient Instructions (Addendum)
Xray today   Augmentin today >>> Take 1 tablet by mouth twice a day for the next 7 days >>>Take with food >>>You can take yogurt or  a probiotic which can help with good gut health   Trelegy sample  >>> pt to call office if symptoms improve on this therapy  >>>this replaces Spiriva and Breo   Ventolin HFA  >>>order placed today  >>> This will replace your rescue inhaler and be your new rescue inhaler once you are out of the pro-air that [name] gave you >>> Follow-up with our office if you have any issues obtaining this medication >>> Always carry your rescue inhaler with you, this is what to use if you get short of breath as it works over 15 minutes  DuoNeb in office today  Continue oxygen therapy 2 L at night, or needed during the day to maintain oxygen saturations greater than 90% >>> Check oxygen levels with SPO2 monitor at home and ensure that you are maintaining greater than 90% >>> Follow-up with our office if you are having issues maintaining 90% or symptomatically short of breath  Low-dose Ct screening  >>> I will follow-up with the head of the low-dose CT screening department today as of right now I would plan on rescheduling the low-dose CT in about 6 weeks from today  It is imperative that if you are ever out of any of your respiratory medications that you call this office and let us know.  If you are having any difficulty affording your medications please call his office and let us know.  We cannot help support you if we are not notified.  It is very important that you continue on these medications to manage her COPD.  We will see you back here in 6 weeks to see Dr. Kendrick FriesMcquaid or myself.  Or sooner if needed based off of your symptoms or if you have concerns regarding your respiratory status.      Please contact the office if your symptoms worsen or you have concerns that you are not improving.   Thank you for choosing [facility] for your healthcare, and  for allowing us to partner with you on your healthcare journey. I am thankful to be able to provide care to you today.   Elisha HeadlandBrian Mack FNP-C

## 2018-03-07 NOTE — Progress Notes (Signed)
Reviewed, agree 

## 2018-03-07 NOTE — Assessment & Plan Note (Signed)
Trelegy sample provided today >>> Patient to call if sample is working well >>> Call when you finish her first inhaler to update you as with your status   Will bring back in 6 weeks  Exacerbation today will cover with Augmentin, will not do prednisone as patient has had a significant reaction with fluid retention in the past.  Will monitor for now

## 2018-03-31 ENCOUNTER — Ambulatory Visit (INDEPENDENT_AMBULATORY_CARE_PROVIDER_SITE_OTHER)
Admission: RE | Admit: 2018-03-31 | Discharge: 2018-03-31 | Disposition: A | Discharge status: Home / Self Care | Payer: Medicare Other | Source: Ambulatory Visit | Attending: Acute Care | Admitting: Acute Care

## 2018-03-31 DIAGNOSIS — Z87891 Personal history of nicotine dependence: Secondary | ICD-10-CM

## 2018-03-31 DIAGNOSIS — R911 Solitary pulmonary nodule: Secondary | ICD-10-CM | POA: Diagnosis not present

## 2018-03-31 DIAGNOSIS — J439 Emphysema, unspecified: Secondary | ICD-10-CM | POA: Diagnosis not present

## 2018-03-31 DIAGNOSIS — Z122 Encounter for screening for malignant neoplasm of respiratory organs: Secondary | ICD-10-CM

## 2018-04-02 ENCOUNTER — Telehealth: Payer: Self-pay | Admitting: Pulmonary Disease

## 2018-04-02 MED ORDER — FLUTICASONE-UMECLIDIN-VILANT 100-62.5-25 MCG/INH IN AEPB: 1.0000 | INHALATION_SPRAY | Freq: Every day | RESPIRATORY_TRACT | 5 refills | Status: DC

## 2018-04-02 NOTE — Telephone Encounter (Signed)
Seen on 03-07-18:  COPD GOLD II Trelegy sample provided today >>> Patient to call if sample is working well >>> Call when you finish her first inhaler to update you as with your status   Will bring back in 6 weeks  Exacerbation today will cover with Augmentin, will not do prednisone as patient has had a significant reaction with fluid retention in the past.  Will monitor for now    Pt states Trelegy is working well and [facility] [address] Nothing more needed at this time.

## 2018-04-03 DIAGNOSIS — J449 Chronic obstructive pulmonary disease, unspecified: Secondary | ICD-10-CM | POA: Diagnosis not present

## 2018-04-08 ENCOUNTER — Other Ambulatory Visit: Payer: Self-pay | Admitting: Acute Care

## 2018-04-08 DIAGNOSIS — Z87891 Personal history of nicotine dependence: Secondary | ICD-10-CM

## 2018-04-08 DIAGNOSIS — Z122 Encounter for screening for malignant neoplasm of respiratory organs: Secondary | ICD-10-CM

## 2018-04-18 ENCOUNTER — Ambulatory Visit: Payer: Medicare Other | Admitting: Pulmonary Disease

## 2018-04-20 NOTE — Progress Notes (Signed)
@Patient  ID: Nicholas Camacho, male    DOB: Dec 13, 1950, 67 y.o.   MRN: [id number]  Chief Complaint  Patient presents with  . Follow-up    COPD follow up     Referring provider: Farris Has, MD  HPI:  Synopsis: Former patient of Dr. Shelle Iron has COPD.  He quit smoking in 11/2015. PFT's 10/2013:  FEV1 2.38 (72%), ratio 58, no BD response, TLC normal, DLCO 72% Had significant weight from fluid retention gain while on a prednisone taper for a COPD exacerbation. 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  Patient reports never wearing a respirator mask as "they scare the customers".  Former smoker (quit 11/19/2015) with a pack year history of 67.5.  And lung cancer screening program last CT lung RADS 2 repeat in 1 year (04/2019).   Recent [facility] Encounters:    03/07/18 Acute visit Pleasant 67 year old male patient presents today in office with increased shortness of breath over the past 3 to 4 weeks.  Patient also reporting productive cough.  Patient has not been seen in our office since August 2018.  Patient reports that he ran out of his Breo Ellipta inhaler about a month ago.  Patient also reporting adherence to Spiriva, as well as his rescue inhaler which he has had to use 3-4 times a day over the past few weeks. Patient was treated with doxycycline via telephone encounter last month when his low-dose CT screening showed an infiltrate.  Patient reports that symptoms have persisted since then.  Patient denies any fevers. Patient does have the ability to check oxygen levels at home but forgets to do so often.  Patient says that when he does check it is usually above 90%.  Patient does report though that sometimes his shortness of breath is pronounced when he is sleeping, but he will use his rescue inhaler and SPO2 monitor downstairs. Plan: Chest x-ray today, Augmentin today, will not do prednisone due to past reactions, bring back in 6 weeks, start on trelegy  Ellipta, switched rescue inhaler to appropriate formulary   02/14/2018-telephone encounter-low-dose CT scan inconclusive due to new patchy consolidation of right lung base, repeat low-dose CT in 1 to 2 months after antimicrobial therapy 02/14/2018- telephone encounter-doxycycline 100 mg twice daily x7 days we will repeat low-dose CT in 6 weeks    05/28/2017-office visit-[name] Treated for COPD exacerbation, Augmentin, did not do prednisone as patient wants to avoid it, continue using your [facility] then we can switch her to [facility] which she can take once daily, keep taking her Spiriva, follow-up in 4 to 6 months  02/26/2017-office visit- TP Follow-up from recent COPD and AR flare, continue Dulera and Spiriva, follow-up with Dr. Kendrick Fries, patient declined O2, keep follow-up with primary care  Tests:   03/31/2018-CT chest low-dose screening follow-up-lung RADS 2 benign appearance or behavior continue annual screening with low-dose chest CT without contrast in 12 months,, prior patchy tree-in-bud nodular opacities have resolved  Imaging:  02/14/2018-low-dose CT scan-inconclusive due to new consolidation in the right lung base 02/11/17-chest x-ray- hyperinflation with emphysematous changes, coarse interstitial opacities likely chronic 02/08/2017-low-dose CT scan- lung RADS 2 as, benign appearance and behavior, repeat low-dose CT in 12 months, mild diffuse bronchial wall thickening with mild centrilobular and paraseptal emphysema  Cardiac:  01/07/2016-echocardiogram-LV ejection fraction 60 to 65%, grade 2 diastolic dysfunction  Labs:   Micro:   Chart Review:      04/21/18 OV Pleasant 67 year old patient of Dr. Kendrick Fries seen in office today  for 6-week follow-up.  Since being seen last patient is completed repeat low-dose CT showing clearing of patchy infiltrates.  Will repeat in 1 year based off a screening guidelines.  Patient was also trialed on trelegy Ellipta at last appointment and has been doing well  on this.  Patient reports that this medication cost $212 to obtain.  Patient and pharmacy both believe this is due to her deductible.  Patient reports that her oxygen levels have dropped some when he is at home.  Patient's oxygen levels he is reporting is between 85 to 87%.  Also having multiple recurrent episodes of exertional dyspnea such as walking out to put his trash trash can, walking outside.  Patient does report he was able to do his grocery shopping with cart at a normal pace but slower than others.  Patient reports that he has been using his rescue inhaler 1-2 times a day but admits that some of this is out of habit.    Allergies  Allergen Reactions  . Morphine And Related     HALLUCINATIONS    Immunization History  Administered Date(s) Administered  . Influenza,inj,Quad PF,6+ Mos 06/20/2015, 11/02/2016, 07/25/2017  . Pneumococcal Conjugate-13 10/06/2013   Discussed pneumonia vaccines with patient.  Patient believes that his PCP Dr. Kateri Plummer has administered the Pneumovax to him, patient to follow-up and double check this with their office and contact our office to update.   Past Medical History:  Diagnosis Date  . Disturbance of skin sensation 06/24/2013  . Elevated cholesterol   . History of echocardiogram    Echo 4/17: EF 60-65%, no RWMA, Gr 2 DD  . Sleep disturbance, unspecified 06/24/2013    Tobacco History: Social History   Tobacco Use  Smoking Status Former Smoker  . Packs/day: 1.50  . Years: 45.00  . Pack years: 67.50  . Types: Cigarettes  . Last attempt to quit: 11/19/2015  . Years since quitting: 2.4  Smokeless Tobacco Never Used  Tobacco Comment   pt quit smoking!! 11/19/15   Counseling given: Not Answered Comment: pt quit smoking!! 11/19/15 Continue not smoking.  Outpatient Encounter Medications as of 04/21/2018  Medication Sig  . albuterol (PROVENTIL HFA;VENTOLIN HFA) 108 (90 Base) MCG/ACT inhaler Inhale 2 puffs into the lungs every 6 (six) hours as  needed for wheezing or shortness of breath.  [name] apixaban (ELIQUIS) 5 MG TABS tablet Take 5 mg by mouth 2 (two) times daily.  [name] atorvastatin (LIPITOR) 80 MG tablet Take 1 tablet (80 mg total) by mouth daily.  . Fluticasone-Umeclidin-Vilant (TRELEGY ELLIPTA) 100-62.5-25 MCG/INH AEPB Inhale 1 puff into the lungs daily.  [name] loratadine (CLARITIN) 10 MG tablet Take 1 tablet (10 mg total) by mouth daily.  [name] omeprazole (PRILOSEC) 20 MG capsule Take 20 mg by mouth daily as needed (REFLUX).   . Fluticasone-Umeclidin-Vilant (TRELEGY ELLIPTA) 100-62.5-25 MCG/INH AEPB Inhale 1 puff into the lungs daily.  [name] losartan (COZAAR) 50 MG tablet Take 1 tablet (50 mg total) by mouth daily.  . nitroGLYCERIN (NITROSTAT) 0.4 MG SL tablet Place 1 tablet (0.4 mg total) under the tongue every 5 (five) minutes as needed for chest pain.  . Tiotropium Bromide Monohydrate (SPIRIVA RESPIMAT) 2.5 MCG/ACT AERS USE 2 INHALATIONS DAILY (Patient not taking: Reported on 04/21/2018)  . [DISCONTINUED] amoxicillin-clavulanate (AUGMENTIN) 875-125 MG tablet Take 1 tablet by mouth 2 (two) times daily. (Patient not taking: Reported on 04/21/2018)  . [DISCONTINUED] doxycycline (VIBRA-TABS) 100 MG tablet Take 1 tablet (100 mg total) by mouth 2 (two) times daily. (  Patient not taking: Reported on 04/21/2018)  . [DISCONTINUED] fluticasone furoate-vilanterol (BREO ELLIPTA) 100-25 MCG/INH AEPB Inhale 1 puff into the lungs daily. (Patient not taking: Reported on 04/21/2018)  . [DISCONTINUED] nicotine (NICODERM CQ - DOSED IN MG/24 HOURS) 14 mg/24hr patch Place 1 patch (14 mg total) onto the skin daily. (Patient not taking: Reported on 04/21/2018)  . [DISCONTINUED] nicotine (NICODERM CQ - DOSED IN MG/24 HR) 7 mg/24hr patch Place 1 patch (7 mg total) onto the skin daily. (Patient not taking: Reported on 04/21/2018)   Facility-Administered Encounter Medications as of 04/21/2018  Medication  . ipratropium-albuterol (DUONEB) 0.5-2.5 (3) MG/3ML nebulizer solution 3 mL      Review of Systems  Review of Systems  Constitutional: Negative for activity change, chills, fatigue, fever and unexpected weight change.  HENT: Negative for postnasal drip, rhinorrhea, sinus pressure, sinus pain, sneezing and sore throat.   Respiratory: Positive for cough (green mucous - worse in the morning, becomes clear through out day ), shortness of breath and wheezing.   Cardiovascular: Negative for chest pain and palpitations.  Gastrointestinal: Negative for constipation, diarrhea, nausea and vomiting.  Genitourinary: Negative for hematuria and urgency.  Musculoskeletal: Negative for arthralgias.  Skin: Negative for color change.  Neurological: Negative for dizziness, seizures and headaches.  Psychiatric/Behavioral: Negative for dysphoric mood. The patient is not nervous/anxious.   All other systems reviewed and are negative.  MMRC - Breathlessness Score 2 - on level ground, I walk slower than people of the same age because of breathlessness, or have to stop for breathe when walking to my own pace  FEV1 - 71   Physical Exam  BP 128/80   Pulse 75   Ht 5\' 10"  (1.778 m)   Wt 208 lb 12.8 oz (94.7 kg)   SpO2 92%   BMI 29.96 kg/m   Wt Readings from Last 5 Encounters:  04/21/18 208 lb 12.8 oz (94.7 kg)  03/07/18 202 lb (91.6 kg)  12/23/17 202 lb 12.8 oz (92 kg)  09/17/17 203 lb 12.8 oz (92.4 kg)  08/30/17 205 lb (93 kg)    Physical Exam  Constitutional: He is oriented to person, place, and time and well-developed, well-nourished, and in no distress. Vital signs are normal. No distress.  HENT:  Head: Normocephalic and atraumatic.  Right Ear: Hearing, tympanic membrane, external ear and ear canal normal.  Left Ear: Hearing, tympanic membrane, external ear and ear canal normal.  Mouth/Throat: Uvula is midline and oropharynx is clear and moist. No oropharyngeal exudate.  Eyes: Pupils are equal, round, and reactive to light.  Neck: Normal range of motion. Neck  supple. No JVD present.  Cardiovascular: Normal rate, regular rhythm and normal heart sounds.  Pulmonary/Chest: Effort normal and breath sounds normal. No accessory muscle usage. No respiratory distress. He has no decreased breath sounds. He has no wheezes. He has no rhonchi.  Abdominal: Soft. Bowel sounds are normal. There is no tenderness.  Musculoskeletal: Normal range of motion. He exhibits no edema.  Lymphadenopathy:    He has no cervical adenopathy.  Neurological: He is alert and oriented to person, place, and time. Gait normal.  Skin: Skin is warm and dry. He is not diaphoretic. No erythema.  Psychiatric: Mood, memory, affect and judgment normal.  Nursing note and vitals reviewed.    Lab Results:  CBC    Component Value Date/Time   WBC 9.6 08/27/2017 0918   WBC 14.2 Repeated and verified X2. (H) 02/11/2017 1508   RBC 5.28 08/27/2017 [id number]  RBC 5.08 02/11/2017 1508   HGB 15.4 08/27/2017 0918   HCT 46.9 08/27/2017 0918   PLT 338 08/27/2017 0918   MCV 89 08/27/2017 0918   MCH 29.2 08/27/2017 0918   MCH 28.4 03/30/2016 0951   MCHC 32.8 08/27/2017 0918   MCHC 32.6 02/11/2017 1508   RDW 14.3 08/27/2017 0918   LYMPHSABS 1.5 08/27/2017 0918   MONOABS 1.4 (H) 02/11/2017 1508   EOSABS 0.3 08/27/2017 0918   BASOSABS 0.0 08/27/2017 0918    BMET    Component Value Date/Time   NA 141 08/27/2017 0918   K 4.8 08/27/2017 0918   CL 102 08/27/2017 0918   CO2 26 08/27/2017 0918   GLUCOSE 92 08/27/2017 0918   GLUCOSE 115 (H) 02/11/2017 1508   BUN 15 08/27/2017 0918   CREATININE 0.92 08/27/2017 0918   CREATININE 1.20 03/30/2016 0951   CALCIUM 9.6 08/27/2017 0918   GFRNONAA 87 08/27/2017 0918   GFRAA 101 08/27/2017 0918    BNP    Component Value Date/Time   BNP 54.0 12/22/2015 1042    ProBNP    Component Value Date/Time   PROBNP 55.0 01/04/2016 1204    Imaging: Ct Chest Lcs Nodule F/u W/o Contrast  Result Date: 03/31/2018 CLINICAL DATA:  67 year old male with 67  pack year history of smoking for short term follow-up lung cancer screening EXAM: CT CHEST WITHOUT CONTRAST FOR LUNG CANCER SCREENING NODULE FOLLOW-UP TECHNIQUE: Multidetector CT imaging of the chest was performed following the standard protocol without IV contrast. COMPARISON:  Low dose lung cancer screening dated 02/14/2018 FINDINGS: Cardiovascular: The heart is normal in size. No pericardial effusion. No evidence of thoracic aortic aneurysm. Atherosclerotic calcifications of the aortic arch. Coronary atherosclerosis the LAD and right coronary artery. Mediastinum/Nodes: Small mediastinal lymph nodes which do not meet pathologic CT size criteria. Visualized thyroid is unremarkable. Lungs/Pleura: Mild biapical pleural-parenchymal scarring. Mild centrilobular and paraseptal emphysematous changes, upper lobe predominant. No focal consolidation. Prior patchy/tree-in-bud nodular opacities, right lower lobe predominant, have essentially resolved. Dominant 6.1 mm perifissural nodule along the right minor fissure. Additional 1.9 mm calcified granuloma in the right upper lobe. No pleural effusion or pneumothorax. Upper Abdomen: Visualized upper abdomen is notable for segmental calcifications in the pancreatic tail, likely reflecting sequela of prior/chronic pancreatitis. Cholelithiasis, without associated inflammatory changes. Musculoskeletal: Mild degenerative changes of the visualized thoracolumbar spine. IMPRESSION: Lung-RADS 2, benign appearance or behavior. Continue annual screening with low-dose chest CT without contrast in 12 months. Aortic Atherosclerosis (ICD10-I70.0) and Emphysema (ICD10-J43.9). Electronically Signed   By: Charline Bills M.D.   On: 03/31/2018 15:43     Assessment & Plan:   67 year old patient seen office today for follow-up.  We will continue trelegy Ellipta.  Discussed with patient that if he has issues affording this medication or obtaining the medication to contact our office.  Will  provide [employer] for me information to patient so they can apply for this program.  Reviewed low-dose CT screening results with patient.  Patient agrees for follow-up in 1 year based off of his lung RADS 2 score.  Patient to follow-up with primary care to obtain shot records and to update our office.  If patient has already received Pneumovax 23 that we can get this updated.  If not then patient needs to receive this at next office visit.  Walk today in office patient's oxygen saturations did drop with exertion.  Will place order for POC to help maintain oxygen saturations greater than 88%.  Patient to  routinely check oxygen levels at home with SPO2 monitor.  If patient is needing oxygen often in order to maintain levels greater than 88% and we can discuss having home oxygen orders.  Follow-up in 3 months with Dr. Kendrick Fries.  COPD GOLD II B Continue trelegy Ellipta Continue to avoid known triggers such as heat Walk today in office to check for oxygen saturations Check status of pneumonia vaccines with primary care and follow-up with our office Continue not smoking Continue follow-up with lung cancer screening program Repeat low-dose CT in 1 year   Cigarette smoker Continue not smoking  Chronic respiratory failure with hypoxia (HCC) Walk in office today Continue to check oxygen saturations at home Ensure you are maintaining above 88%      Coral Ceo, NP 04/21/2018

## 2018-04-21 ENCOUNTER — Encounter: Payer: Self-pay | Admitting: Pulmonary Disease

## 2018-04-21 ENCOUNTER — Ambulatory Visit: Payer: Medicare Other | Admitting: Pulmonary Disease

## 2018-04-21 VITALS — BP 128/80 | HR 75 | Ht 70.0 in | Wt 208.8 lb

## 2018-04-21 DIAGNOSIS — F17210 Nicotine dependence, cigarettes, uncomplicated: Secondary | ICD-10-CM | POA: Diagnosis not present

## 2018-04-21 DIAGNOSIS — J449 Chronic obstructive pulmonary disease, unspecified: Secondary | ICD-10-CM | POA: Diagnosis not present

## 2018-04-21 DIAGNOSIS — J9611 Chronic respiratory failure with hypoxia: Secondary | ICD-10-CM | POA: Diagnosis not present

## 2018-04-21 MED ORDER — FLUTICASONE-UMECLIDIN-VILANT 100-62.5-25 MCG/INH IN AEPB: 1.0000 | INHALATION_SPRAY | Freq: Every day | RESPIRATORY_TRACT | 0 refills | Status: DC

## 2018-04-21 NOTE — Assessment & Plan Note (Signed)
Continue not smoking 

## 2018-04-21 NOTE — Assessment & Plan Note (Signed)
Walk in office today Continue to check oxygen saturations at home Ensure you are maintaining above 88%

## 2018-04-21 NOTE — Assessment & Plan Note (Signed)
Continue trelegy Ellipta Continue to avoid known triggers such as heat Walk today in office to check for oxygen saturations Check status of pneumonia vaccines with primary care and follow-up with our office Continue not smoking Continue follow-up with lung cancer screening program Repeat low-dose CT in 1 year

## 2018-04-21 NOTE — Patient Instructions (Signed)
Walk today in office for POC Check status of pneumonia vaccines with primary care Continue trelegy Ellipta >>> [employer] for [employer] information given to patient >>> Get year to date out-of-pocket spending from pharmacy and applied to this program Continue not smoking Follow-up with our office in 3 months or sooner if necessary Continue to avoid known triggers such as heat    Please contact the office if your symptoms worsen or you have concerns that you are not improving.   Thank you for choosing [facility] for your healthcare, and for allowing us to partner with you on your healthcare journey. I am thankful to be able to provide care to you today.   Elisha HeadlandBrian Soha Thorup FNP-C       Chronic Obstructive Pulmonary Disease Chronic obstructive pulmonary disease (COPD) is a long-term (chronic) lung problem. When you have COPD, it is hard for air to get in and out of your lungs. The way your lungs work will never return to normal. Usually the condition gets worse over time. There are things you can do to keep yourself as healthy as possible. Your doctor may treat your condition with:  Medicines.  Quitting smoking, if you smoke.  Rehabilitation. This may involve a team of specialists.  Oxygen.  Exercise and changes to your diet.  Lung surgery.  Comfort measures (palliative care).  Follow these instructions at home: Medicines  Take over-the-counter and prescription medicines only as told by your doctor.  Talk to your doctor before taking any cough or allergy medicines. You may need to avoid medicines that cause your lungs to be dry. Lifestyle  If you smoke, stop. Smoking makes the problem worse. If you need help quitting, ask your doctor.  Avoid being around things that make your breathing worse. This may include smoke, chemicals, and fumes.  Stay active, but remember to also rest.  Learn and use tips on how to relax.  Make sure you get enough sleep. Most adults need at least 7  hours a night.  Eat healthy foods. Eat smaller meals more often. Rest before meals. Controlled breathing  Learn and use tips on how to control your breathing as told by your doctor. Try: ? Breathing in (inhaling) through your nose for 1 second. Then, pucker your lips and breath out (exhale) through your lips for 2 seconds. ? Putting one hand on your belly (abdomen). Breathe in slowly through your nose for 1 second. Your hand on your belly should move out. Pucker your lips and breathe out slowly through your lips. Your hand on your belly should move in as you breathe out. Controlled coughing  Learn and use controlled coughing to clear mucus from your lungs. The steps are: 1. Lean your head a little forward. 2. Breathe in deeply. 3. Try to hold your breath for 3 seconds. 4. Keep your mouth slightly open while coughing 2 times. 5. Spit any mucus out into a tissue. 6. Rest and do the steps again 1 or 2 times as needed. General instructions  Make sure you get all the shots (vaccines) that your doctor recommends. Ask your doctor about a flu shot and a pneumonia shot.  Use oxygen therapy and therapy to help improve your lungs (pulmonary rehabilitation) if told by your doctor. If you need home oxygen therapy, ask your doctor if you should buy a tool to measure your oxygen level (oximeter).  Make a COPD action plan with your doctor. This helps you know what to do if you feel worse  than usual.  Manage any other conditions you have as told by your doctor.  Avoid going outside when it is very hot, cold, or humid.  Avoid people who have a sickness you can catch (contagious).  Keep all follow-up visits as told by your doctor. This is important. Contact a doctor if:  You cough up more mucus than usual.  There is a change in the color or thickness of the mucus.  It is harder to breathe than usual.  Your breathing is faster than usual.  You have trouble sleeping.  You need to use your  medicines more often than usual.  You have trouble doing your normal activities such as getting dressed or walking around the house. Get help right away if:  You have shortness of breath while resting.  You have shortness of breath that stops you from: ? Being able to talk. ? Doing normal activities.  Your chest hurts for longer than 5 minutes.  Your skin color is more blue than usual.  Your pulse oximeter shows that you have low oxygen for longer than 5 minutes.  You have a fever.  You feel too tired to breathe normally. Summary  Chronic obstructive pulmonary disease (COPD) is a long-term lung problem.  The way your lungs work will never return to normal. Usually the condition gets worse over time. There are things you can do to keep yourself as healthy as possible.  Take over-the-counter and prescription medicines only as told by your doctor.  If you smoke, stop. Smoking makes the problem worse. This information is not intended to replace advice given to you by your health care provider. Make sure you discuss any questions you have with your health care provider. Document Released: 03/05/2008 Document Revised: 02/23/2016 Document Reviewed: 05/14/2013 Elsevier Interactive Patient Education  2017 [employer].

## 2018-04-21 NOTE — Progress Notes (Signed)
Reviewed, agree 

## 2018-04-22 ENCOUNTER — Telehealth: Payer: Self-pay | Admitting: Pulmonary Disease

## 2018-04-22 DIAGNOSIS — J9611 Chronic respiratory failure with hypoxia: Secondary | ICD-10-CM

## 2018-04-22 DIAGNOSIS — J449 Chronic obstructive pulmonary disease, unspecified: Secondary | ICD-10-CM

## 2018-04-22 NOTE — Telephone Encounter (Signed)
Spoke with pt, states that he was prescribed O2 yesterday, and was walked on a POC.  The order states that he was to be prescribed a pulse dose machine but did not state a POC.  Pt would like a POC and not a tank.  Per Brian's note yesterday, the order is to be for a POC and not a tank.   New order placed for a POC.  Nothing further needed.

## 2018-05-04 DIAGNOSIS — J449 Chronic obstructive pulmonary disease, unspecified: Secondary | ICD-10-CM | POA: Diagnosis not present

## 2018-05-13 ENCOUNTER — Other Ambulatory Visit: Payer: Self-pay

## 2018-05-13 MED ORDER — APIXABAN 5 MG PO TABS: 5.0000 mg | ORAL_TABLET | Freq: Two times a day (BID) | ORAL | 5 refills | Status: DC

## 2018-05-15 ENCOUNTER — Other Ambulatory Visit: Payer: Self-pay | Admitting: Cardiology

## 2018-05-15 NOTE — Telephone Encounter (Signed)
Yes please refill 

## 2018-05-15 NOTE — Telephone Encounter (Signed)
Okay to refill? Please advise. Thanks, MI 

## 2018-06-04 DIAGNOSIS — J449 Chronic obstructive pulmonary disease, unspecified: Secondary | ICD-10-CM | POA: Diagnosis not present

## 2018-06-09 DIAGNOSIS — M25561 Pain in right knee: Secondary | ICD-10-CM | POA: Diagnosis not present

## 2018-06-10 ENCOUNTER — Other Ambulatory Visit: Payer: Self-pay | Admitting: Family Medicine

## 2018-06-10 DIAGNOSIS — M25561 Pain in right knee: Secondary | ICD-10-CM

## 2018-06-11 ENCOUNTER — Ambulatory Visit
Admission: RE | Admit: 2018-06-11 | Discharge: 2018-06-11 | Disposition: A | Discharge status: Home / Self Care | Payer: Medicare Other | Source: Ambulatory Visit | Attending: Family Medicine | Admitting: Family Medicine

## 2018-06-11 DIAGNOSIS — M1711 Unilateral primary osteoarthritis, right knee: Secondary | ICD-10-CM | POA: Diagnosis not present

## 2018-06-11 DIAGNOSIS — M25561 Pain in right knee: Secondary | ICD-10-CM | POA: Diagnosis not present

## 2018-06-12 ENCOUNTER — Telehealth: Payer: Self-pay

## 2018-06-12 NOTE — Telephone Encounter (Signed)
   [facility] Pre-operative Risk Assessment    Request for surgical clearance:  1. What type of surgery is being performed?  Right Total Knee   2. When is this surgery scheduled?  TBD   3. What type of clearance is required (medical clearance vs. Pharmacy clearance to hold med vs. Both)?  Medical  4. Are there any medications that need to be held prior to surgery and how long?    5. Practice name and name of physician performing surgery?  [facility]/ Dr Gladstone Lighter  6. What is your office phone number [phone]    7.   What is your office fax number [phone]  8.   Anesthesia type (None, local, MAC, general) ?  choice   Nicholas Camacho 06/12/2018, 8:27 AM  _________________________________________________________________   (provider comments below)

## 2018-06-13 NOTE — Telephone Encounter (Signed)
   Primary Cardiologist:Katarina Delton SeeNelson, MD  Chart reviewed as part of pre-operative protocol coverage. Because of Charmayne SheerCharles M Venier's past medical history and time since last visit, he/she will require a follow-up visit in order to better assess preoperative cardiovascular risk.  Pre-op covering staff: - Please schedule appointment and call patient to inform them. - Please contact requesting surgeon's office via preferred method (i.e, phone, fax) to inform them of need for appointment prior to surgery.  Georgie ChardJill Jontae Sonier, NP  06/13/2018, 11:09 AM

## 2018-06-17 DIAGNOSIS — M1712 Unilateral primary osteoarthritis, left knee: Secondary | ICD-10-CM | POA: Diagnosis not present

## 2018-06-17 DIAGNOSIS — M1711 Unilateral primary osteoarthritis, right knee: Secondary | ICD-10-CM | POA: Diagnosis not present

## 2018-06-17 DIAGNOSIS — M170 Bilateral primary osteoarthritis of knee: Secondary | ICD-10-CM | POA: Diagnosis not present

## 2018-07-01 DIAGNOSIS — M1711 Unilateral primary osteoarthritis, right knee: Secondary | ICD-10-CM | POA: Diagnosis not present

## 2018-07-04 DIAGNOSIS — J449 Chronic obstructive pulmonary disease, unspecified: Secondary | ICD-10-CM | POA: Diagnosis not present

## 2018-07-21 DIAGNOSIS — M1711 Unilateral primary osteoarthritis, right knee: Secondary | ICD-10-CM | POA: Diagnosis not present

## 2018-07-22 ENCOUNTER — Encounter: Payer: Self-pay | Admitting: Pulmonary Disease

## 2018-07-22 ENCOUNTER — Ambulatory Visit: Payer: Medicare Other | Admitting: Pulmonary Disease

## 2018-07-22 VITALS — BP 150/90 | HR 83 | Ht 70.0 in | Wt 223.2 lb

## 2018-07-22 DIAGNOSIS — J449 Chronic obstructive pulmonary disease, unspecified: Secondary | ICD-10-CM

## 2018-07-22 DIAGNOSIS — J9611 Chronic respiratory failure with hypoxia: Secondary | ICD-10-CM | POA: Diagnosis not present

## 2018-07-22 DIAGNOSIS — J309 Allergic rhinitis, unspecified: Secondary | ICD-10-CM | POA: Insufficient documentation

## 2018-07-22 DIAGNOSIS — Z87891 Personal history of nicotine dependence: Secondary | ICD-10-CM

## 2018-07-22 DIAGNOSIS — Z23 Encounter for immunization: Secondary | ICD-10-CM

## 2018-07-22 MED ORDER — FLUTICASONE-UMECLIDIN-VILANT 100-62.5-25 MCG/INH IN AEPB: 1.0000 | INHALATION_SPRAY | Freq: Every day | RESPIRATORY_TRACT | 0 refills | Status: DC

## 2018-07-22 MED ORDER — FLUTICASONE PROPIONATE 50 MCG/ACT NA SUSP: 2.0000 | Freq: Two times a day (BID) | NASAL | 3 refills | Status: DC

## 2018-07-22 NOTE — Assessment & Plan Note (Signed)
Lung RADS2 - repeat low-dose CT in July/2020

## 2018-07-22 NOTE — Assessment & Plan Note (Signed)
Continue Trelegy Ellipta  >>> 1 puff daily in the morning >>>rinse mouth out after use  >>> This inhaler contains 3 medications that help manage her respiratory status, contact our office if you cannot afford this medication or unable to remain on this medication  Continue Claritin  Can add Flonase  >>> 1 spray each nostril as needed for nasal congestion   Follow up in 4-6 months with Dr. Kendrick Fries  High dose Flu shot today   Continue to check oxygen levels at home to ensure that SPO2 90% or greater  Note your daily symptoms > remember "red flags" for COPD:   >>>Increase in cough >>>increase in sputum production >>>increase in shortness of breath or activity  intolerance.   If you notice these symptoms, please call the office to be seen.

## 2018-07-22 NOTE — Patient Instructions (Addendum)
Continue Trelegy Ellipta  >>> 1 puff daily in the morning >>>rinse mouth out after use  >>> This inhaler contains 3 medications that help manage her respiratory status, contact our office if you cannot afford this medication or unable to remain on this medication  Continue Claritin  Can add Flonase  >>> 1 spray each nostril as needed for nasal congestion   Follow up in 4-6 months with Dr. [facility] regarding BP today   High dose Flu shot today   Continue to check oxygen levels at home to ensure that SPO2 90% or greater  Note your daily symptoms > remember "red flags" for COPD:   >>>Increase in cough >>>increase in sputum production >>>increase in shortness of breath or activity  intolerance.   If you notice these symptoms, please call the office to be seen.       November/2019 we will be moving! We will no longer be at our [place] location.  Be on the look out for a post card/mailer to let you know we have officially moved.  Our new address and phone number will be:  [address] [place], [place] [address] Telephone number: [phone]  It is flu season:   >>>Remember to be washing your hands regularly, using hand sanitizer, be careful to use around herself with has contact with people who are sick will increase her chances of getting sick yourself. >>> Best ways to protect herself from the flu: Receive the yearly flu vaccine, practice good hand hygiene washing with soap and also using hand sanitizer when available, eat a nutritious meals, get adequate rest, hydrate appropriately   Please contact the office if your symptoms worsen or you have concerns that you are not improving.   Thank you for choosing [facility] for your healthcare, and for allowing [place] to partner with you on your healthcare journey. I am thankful to be able to provide care to you today.   Elisha Headland FNP-C      Chronic Obstructive Pulmonary Disease Chronic  obstructive pulmonary disease (COPD) is a long-term (chronic) lung problem. When you have COPD, it is hard for air to get in and out of your lungs. The way your lungs work will never return to normal. Usually the condition gets worse over time. There are things you can do to keep yourself as healthy as possible. Your doctor may treat your condition with:  Medicines.  Quitting smoking, if you smoke.  Rehabilitation. This may involve a team of specialists.  Oxygen.  Exercise and changes to your diet.  Lung surgery.  Comfort measures (palliative care).  Follow these instructions at home: Medicines  Take over-the-counter and prescription medicines only as told by your doctor.  Talk to your doctor before taking any cough or allergy medicines. You may need to avoid medicines that cause your lungs to be dry. Lifestyle  If you smoke, stop. Smoking makes the problem worse. If you need help quitting, ask your doctor.  Avoid being around things that make your breathing worse. This may include smoke, chemicals, and fumes.  Stay active, but remember to also rest.  Learn and use tips on how to relax.  Make sure you get enough sleep. Most adults need at least 7 hours a night.  Eat healthy foods. Eat smaller meals more often. Rest before meals. Controlled breathing  Learn and use tips on how to control your breathing as told by your doctor. Try: ? Breathing in (inhaling) through your  nose for 1 second. Then, pucker your lips and breath out (exhale) through your lips for 2 seconds. ? Putting one hand on your belly (abdomen). Breathe in slowly through your nose for 1 second. Your hand on your belly should move out. Pucker your lips and breathe out slowly through your lips. Your hand on your belly should move in as you breathe out. Controlled coughing  Learn and use controlled coughing to clear mucus from your lungs. The steps are: 1. Lean your head a little forward. 2. Breathe in  deeply. 3. Try to hold your breath for 3 seconds. 4. Keep your mouth slightly open while coughing 2 times. 5. Spit any mucus out into a tissue. 6. Rest and do the steps again 1 or 2 times as needed. General instructions  Make sure you get all the shots (vaccines) that your doctor recommends. Ask your doctor about a flu shot and a pneumonia shot.  Use oxygen therapy and therapy to help improve your lungs (pulmonary rehabilitation) if told by your doctor. If you need home oxygen therapy, ask your doctor if you should buy a tool to measure your oxygen level (oximeter).  Make a COPD action plan with your doctor. This helps you know what to do if you feel worse than usual.  Manage any other conditions you have as told by your doctor.  Avoid going outside when it is very hot, cold, or humid.  Avoid people who have a sickness you can catch (contagious).  Keep all follow-up visits as told by your doctor. This is important. Contact a doctor if:  You cough up more mucus than usual.  There is a change in the color or thickness of the mucus.  It is harder to breathe than usual.  Your breathing is faster than usual.  You have trouble sleeping.  You need to use your medicines more often than usual.  You have trouble doing your normal activities such as getting dressed or walking around the house. Get help right away if:  You have shortness of breath while resting.  You have shortness of breath that stops you from: ? Being able to talk. ? Doing normal activities.  Your chest hurts for longer than 5 minutes.  Your skin color is more blue than usual.  Your pulse oximeter shows that you have low oxygen for longer than 5 minutes.  You have a fever.  You feel too tired to breathe normally. Summary  Chronic obstructive pulmonary disease (COPD) is a long-term lung problem.  The way your lungs work will never return to normal. Usually the condition gets worse over time. There are  things you can do to keep yourself as healthy as possible.  Take over-the-counter and prescription medicines only as told by your doctor.  If you smoke, stop. Smoking makes the problem worse. This information is not intended to replace advice given to you by your health care provider. Make sure you discuss any questions you have with your health care provider. Document Released: 03/05/2008 Document Revised: 02/23/2016 Document Reviewed: 05/14/2013 Elsevier Interactive Patient Education  2017 Elsevier Inc.     Influenza Virus Vaccine injection What is this medicine? INFLUENZA VIRUS VACCINE (in floo EN zuh VAHY ruhs vak SEEN) helps to reduce the risk of getting influenza also known as the flu. The vaccine only helps protect you against some strains of the flu. This medicine may be used for other purposes; ask your health care provider or pharmacist if you have questions. COMMON  BRAND NAME(S): Afluria, Agriflu, Alfuria, FLUAD, Fluarix, Fluarix Quadrivalent, Flublok, Flublok Quadrivalent, FLUCELVAX, Flulaval, Fluvirin, Fluzone, Fluzone High-Dose, Fluzone Intradermal What should I tell my health care provider before I take this medicine? They need to know if you have any of these conditions: -bleeding disorder like hemophilia -fever or infection -Guillain-Barre syndrome or other neurological problems -immune system problems -infection with the human immunodeficiency virus (HIV) or AIDS -low blood platelet counts -multiple sclerosis -an unusual or allergic reaction to influenza virus vaccine, latex, other medicines, foods, dyes, or preservatives. Different brands of vaccines contain different allergens. Some may contain latex or eggs. Talk to your doctor about your allergies to make sure that you get the right vaccine. -pregnant or trying to get pregnant -breast-feeding How should I use this medicine? This vaccine is for injection into a muscle or under the skin. It is given by a health care  professional. A copy of Vaccine Information Statements will be given before each vaccination. Read this sheet carefully each time. The sheet may change frequently. Talk to your healthcare provider to see which vaccines are right for you. Some vaccines should not be used in all age groups. Overdosage: If you think you have taken too much of this medicine contact a poison control center or emergency room at once. NOTE: This medicine is only for you. Do not share this medicine with others. What if I miss a dose? This does not apply. What may interact with this medicine? -chemotherapy or radiation therapy -medicines that lower your immune system like etanercept, anakinra, infliximab, and adalimumab -medicines that treat or prevent blood clots like warfarin -phenytoin -steroid medicines like prednisone or cortisone -theophylline -vaccines This list may not describe all possible interactions. Give your health care provider a list of all the medicines, herbs, non-prescription drugs, or dietary supplements you use. Also tell them if you smoke, drink alcohol, or use illegal drugs. Some items may interact with your medicine. What should I watch for while using this medicine? Report any side effects that do not go away within 3 days to your doctor or health care professional. Call your health care provider if any unusual symptoms occur within 6 weeks of receiving this vaccine. You may still catch the flu, but the illness is not usually as bad. You cannot get the flu from the vaccine. The vaccine will not protect against colds or other illnesses that may cause fever. The vaccine is needed every year. What side effects may I notice from receiving this medicine? Side effects that you should report to your doctor or health care professional as soon as possible: -allergic reactions like skin rash, itching or hives, swelling of the face, lips, or tongue Side effects that usually do not require medical attention  (report to your doctor or health care professional if they continue or are bothersome): -fever -headache -muscle aches and pains -pain, tenderness, redness, or swelling at the injection site -tiredness This list may not describe all possible side effects. Call your doctor for medical advice about side effects. You may report side effects to FDA at 1-800-FDA-1088. Where should I keep my medicine? The vaccine will be given by a health care professional in a clinic, pharmacy, doctor's office, or other health care setting. You will not be given vaccine doses to store at home. NOTE: This sheet is a summary. It may not cover all possible information. If you have questions about this medicine, talk to your doctor, pharmacist, or health care provider.  2018 Elsevier/Gold Standard (2015-04-08  10:07:28)  

## 2018-07-22 NOTE — Progress Notes (Signed)
Reviewed, agree 

## 2018-07-22 NOTE — Assessment & Plan Note (Signed)
Continue Claritin  Can add Flonase  >>> 1 spray each nostril as needed for nasal congestion

## 2018-07-22 NOTE — Assessment & Plan Note (Signed)
  Follow up in 4-6 months with Dr. Kendrick Fries  Continue to check oxygen levels at home to ensure that SPO2 90% or greater

## 2018-07-22 NOTE — Progress Notes (Signed)
@Patient  ID: Nicholas Camacho, male    DOB: [birth year], 67 y.o.   MRN: [id number]  Chief Complaint  Patient presents with  . Follow-up    COPD follow up     Referring provider: Farris Has, MD  HPI:  67 year old male former smoker followed in our office for COPD GOLD II.  The patient has not tolerated prednisone taper as well as he had significant fluid retention previously.  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  PMH: AFIB (on eliquis), HTN, CAD, Hyperlipidemia Smoker/ Smoking History: Former smoker. Quit 2017. 66.0 pack year history. Lung RADs2 - repeat in 1 year (04/2019) Maintenance:  Trelegy Ellipta Pt of: Dr. Kendrick Fries  07/22/2018  - Visit   67 year old male patient presenting today for follow-up visit.  Patient has been adherent to Trelegy Ellipta.  Patient reports he is recently recovering from a viral illness as he still has a cough and a slight wheeze.  Patient was having brown productive mucus and increased shortness of breath as well.  Patient would like his flu vaccine today.  Patient also reports that Trelegy Ellipta has been much more affordable for him recently.  Patient attributes this to meeting his deductible with his insurance.  Patient reports is been doing well since last office visit.  Patient does admit to having increased right knee pain which she is currently getting gel injections for.  Patient reports this has helped some.  Patient has 2 more injections left to be completed on his right knee.  Patient reports that his right knee pain has led for him to be more sedentary.  He admits he has gained some weight.  He knows that this can affect his breathing.  Patient's blood pressure is elevated today. Blood pressure elevated today   MMRC - Breathlessness Score 2 - on level ground, I walk slower than people of the same age because of breathlessness, or have to stop for breathe when walking to my own pace  Of note, patient has been unable  to obtain a POC.  Patient reports that he does not feel he needs this anymore.  Patient is checking his oxygen levels at home and they are remaining greater than 90% with exertion as well as on room air.  Patient would like to continue to monitor oxygenation before proceeding forward with a POC.     Tests:   03/31/2018-CT chest low-dose screening follow-up-lung RADS 2 benign appearance or behavior continue annual screening with low-dose chest CT without contrast in 12 months,, prior patchy tree-in-bud nodular opacities have resolved  Imaging:  02/14/2018-low-dose CT scan-inconclusive due to new consolidation in the right lung base 02/11/17-chest x-ray- hyperinflation with emphysematous changes, coarse interstitial opacities likely chronic 02/08/2017-low-dose CT scan- lung RADS 2 as, benign appearance and behavior, repeat low-dose CT in 12 months, mild diffuse bronchial wall thickening with mild centrilobular and paraseptal emphysema  Cardiac:  01/07/2016-echocardiogram-LV ejection fraction 60 to 65%, grade 2 diastolic dysfunction  FENO:  No results found for: NITRICOXIDE  PFT: PFT Results Latest Ref Rng & Units 10/23/2013  FVC-Pre L 4.11  FVC-Predicted Pre % 93  FVC-Post L 4.12  FVC-Predicted Post % 94  Pre FEV1/FVC % % 58  Post FEV1/FCV % % 58  FEV1-Pre L 2.38  FEV1-Predicted Pre % 72  DLCO UNC% % 72  DLCO COR %Predicted % 72  TLC L 8.64  TLC % Predicted % 130  RV % Predicted % 206    Imaging: No results found.  Chart Review:    Specialty Problems      Pulmonary Problems   COPD GOLD II B    PFT's 10/2013:  FEV1 2.38 (72%), ratio 58, no BD response, TLC normal, DLCO 72% - 11/04/2015  extensive coaching HFA effectiveness =    75%       Cough   Chronic respiratory failure with hypoxia (HCC)   Pneumonia   Allergic rhinitis      Allergies  Allergen Reactions  . Morphine And Related     HALLUCINATIONS    Immunization History  Administered Date(s) Administered  .  Influenza, High Dose Seasonal PF 07/22/2018  . Influenza,inj,Quad PF,6+ Mos 06/20/2015, 11/02/2016, 07/25/2017  . Pneumococcal Conjugate-13 10/06/2013    Past Medical History:  Diagnosis Date  . Disturbance of skin sensation 06/24/2013  . Elevated cholesterol   . History of echocardiogram    Echo 4/17: EF 60-65%, no RWMA, Gr 2 DD  . Sleep disturbance, unspecified 06/24/2013    Tobacco History: Social History   Tobacco Use  Smoking Status Former Smoker  . Packs/day: 1.50  . Years: 45.00  . Pack years: 67.50  . Types: Cigarettes  . Last attempt to quit: 11/19/2015  . Years since quitting: 2.6  Smokeless Tobacco Never Used  Tobacco Comment   pt quit smoking!! 11/19/15   Counseling given: Yes Comment: pt quit smoking!! 11/19/15  Continue not smoking  Outpatient Encounter Medications as of 07/22/2018  Medication Sig  . albuterol (PROVENTIL HFA;VENTOLIN HFA) 108 (90 Base) MCG/ACT inhaler Inhale 2 puffs into the lungs every 6 (six) hours as needed for wheezing or shortness of breath.  [name] apixaban (ELIQUIS) 5 MG TABS tablet Take 1 tablet (5 mg total) by mouth 2 (two) times daily.  [name] atorvastatin (LIPITOR) 80 MG tablet Take 1 tablet (80 mg total) by mouth daily.  . Fluticasone-Umeclidin-Vilant (TRELEGY ELLIPTA) 100-62.5-25 MCG/INH AEPB Inhale 1 puff into the lungs daily.  . Fluticasone-Umeclidin-Vilant (TRELEGY ELLIPTA) 100-62.5-25 MCG/INH AEPB Inhale 1 puff into the lungs daily.  [name] loratadine (CLARITIN) 10 MG tablet TAKE 1 TABLET(10 MG) BY MOUTH DAILY  . omeprazole (PRILOSEC) 20 MG capsule Take 20 mg by mouth daily as needed (REFLUX).   . fluticasone (FLONASE) 50 MCG/ACT nasal spray Place 2 sprays into both nostrils 2 (two) times daily.  . Fluticasone-Umeclidin-Vilant (TRELEGY ELLIPTA) 100-62.5-25 MCG/INH AEPB Inhale 1 puff into the lungs daily.  [name] losartan (COZAAR) 50 MG tablet Take 1 tablet (50 mg total) by mouth daily.  . nitroGLYCERIN (NITROSTAT) 0.4 MG SL tablet Place 1 tablet  (0.4 mg total) under the tongue every 5 (five) minutes as needed for chest pain.  . [DISCONTINUED] Tiotropium Bromide Monohydrate (SPIRIVA RESPIMAT) 2.5 MCG/ACT AERS USE 2 INHALATIONS DAILY (Patient not taking: Reported on 07/22/2018)   Facility-Administered Encounter Medications as of 07/22/2018  Medication  . ipratropium-albuterol (DUONEB) 0.5-2.5 (3) MG/3ML nebulizer solution 3 mL     Review of Systems  Review of Systems  Constitutional: Positive for activity change and fatigue. Negative for chills, fever and unexpected weight change.  HENT: Positive for congestion and postnasal drip. Negative for rhinorrhea, sinus pressure, sinus pain, sneezing and sore throat.   Eyes: Negative.   Respiratory: Positive for cough (brown mucous ), shortness of breath and wheezing (noticed today ).   Cardiovascular: Negative for chest pain and palpitations.  Gastrointestinal: Positive for diarrhea (4d of more loose stools ). Negative for constipation, nausea and vomiting.  Endocrine: Negative.   Musculoskeletal: Negative.   Skin: Negative.  Neurological: Negative for dizziness and headaches.  Psychiatric/Behavioral: Negative.  Negative for dysphoric mood. The patient is not nervous/anxious.   All other systems reviewed and are negative.    Physical Exam  BP (!) 150/90 (BP Location: Right Arm, Cuff Size: Normal)   Pulse 83   Ht 5\' 10"  (1.778 m)   Wt 223 lb 3.2 oz (101.2 kg)   SpO2 93%   BMI 32.03 kg/m   Wt Readings from Last 5 Encounters:  07/22/18 223 lb 3.2 oz (101.2 kg)  04/21/18 208 lb 12.8 oz (94.7 kg)  03/07/18 202 lb (91.6 kg)  12/23/17 202 lb 12.8 oz (92 kg)  09/17/17 203 lb 12.8 oz (92.4 kg)     Physical Exam  Constitutional: He is oriented to person, place, and time and well-developed, well-nourished, and in no distress. No distress.  HENT:  Head: Normocephalic and atraumatic.  Right Ear: Hearing, tympanic membrane, external ear and ear canal normal.  Left Ear: Hearing,  tympanic membrane, external ear and ear canal normal.  Nose: Mucosal edema and rhinorrhea present. Right sinus exhibits no maxillary sinus tenderness and no frontal sinus tenderness. Left sinus exhibits no maxillary sinus tenderness and no frontal sinus tenderness.  Mouth/Throat: Uvula is midline and oropharynx is clear and moist. No oropharyngeal exudate.  +TMs with effusion without infection, +PND  Eyes: Pupils are equal, round, and reactive to light.  Neck: Normal range of motion. Neck supple. No JVD present.  Cardiovascular: Normal rate, regular rhythm and normal heart sounds.  Pulmonary/Chest: Effort normal and breath sounds normal. No accessory muscle usage. No respiratory distress. He has no decreased breath sounds. He has no wheezes. He has no rhonchi.  Abdominal: Soft. Bowel sounds are normal. There is no tenderness.  Musculoskeletal: Normal range of motion. He exhibits no edema.  Lymphadenopathy:    He has no cervical adenopathy.  Neurological: He is alert and oriented to person, place, and time. Gait normal.  Skin: Skin is warm and dry. He is not diaphoretic. No erythema.  Psychiatric: Mood, memory, affect and judgment normal.  Nursing note and vitals reviewed.     Lab Results:  CBC    Component Value Date/Time   WBC 9.6 08/27/2017 0918   WBC 14.2 Repeated and verified X2. (H) 02/11/2017 1508   RBC 5.28 08/27/2017 0918   RBC 5.08 02/11/2017 1508   HGB 15.4 08/27/2017 0918   HCT 46.9 08/27/2017 0918   PLT 338 08/27/2017 0918   MCV 89 08/27/2017 0918   MCH 29.2 08/27/2017 0918   MCH 28.4 03/30/2016 0951   MCHC 32.8 08/27/2017 0918   MCHC 32.6 02/11/2017 1508   RDW 14.3 08/27/2017 0918   LYMPHSABS 1.5 08/27/2017 0918   MONOABS 1.4 (H) 02/11/2017 1508   EOSABS 0.3 08/27/2017 0918   BASOSABS 0.0 08/27/2017 0918    BMET    Component Value Date/Time   NA 141 08/27/2017 0918   K 4.8 08/27/2017 0918   CL 102 08/27/2017 0918   CO2 26 08/27/2017 0918   GLUCOSE 92  08/27/2017 0918   GLUCOSE 115 (H) 02/11/2017 1508   BUN 15 08/27/2017 0918   CREATININE 0.92 08/27/2017 0918   CREATININE 1.20 03/30/2016 0951   CALCIUM 9.6 08/27/2017 0918   GFRNONAA 87 08/27/2017 0918   GFRAA 101 08/27/2017 0918    BNP    Component Value Date/Time   BNP 54.0 12/22/2015 1042    ProBNP    Component Value Date/Time   PROBNP 55.0 01/04/2016 1204  Assessment & Plan:   Pleasant 67 year old male patient presenting today for follow-up visit.  Patient is doing well.  We will continue Trelegy Ellipta.  We will have patient add Flonase to help with allergic rhinitis symptoms he is having right now.  Flu vaccine today follow-up with Dr. Kendrick Fries in 4 to 6 months.  COPD GOLD II B Continue Trelegy Ellipta  >>> 1 puff daily in the morning >>>rinse mouth out after use  >>> This inhaler contains 3 medications that help manage her respiratory status, contact our office if you cannot afford this medication or unable to remain on this medication  Continue Claritin  Can add Flonase  >>> 1 spray each nostril as needed for nasal congestion   Follow up in 4-6 months with Dr. Kendrick Fries  High dose Flu shot today   Continue to check oxygen levels at home to ensure that SPO2 90% or greater  Note your daily symptoms > remember "red flags" for COPD:   >>>Increase in cough >>>increase in sputum production >>>increase in shortness of breath or activity  intolerance.   If you notice these symptoms, please call the office to be seen.     Chronic respiratory failure with hypoxia (HCC)  Follow up in 4-6 months with Dr. Kendrick Fries  Continue to check oxygen levels at home to ensure that SPO2 90% or greater   Allergic rhinitis Continue Claritin  Can add Flonase  >>> 1 spray each nostril as needed for nasal congestion         Coral Ceo, NP 07/22/2018

## 2018-07-28 DIAGNOSIS — M1711 Unilateral primary osteoarthritis, right knee: Secondary | ICD-10-CM | POA: Diagnosis not present

## 2018-08-04 DIAGNOSIS — J449 Chronic obstructive pulmonary disease, unspecified: Secondary | ICD-10-CM | POA: Diagnosis not present

## 2018-08-04 DIAGNOSIS — M1711 Unilateral primary osteoarthritis, right knee: Secondary | ICD-10-CM | POA: Diagnosis not present

## 2018-08-08 ENCOUNTER — Other Ambulatory Visit: Payer: Self-pay | Admitting: Cardiology

## 2018-08-14 DIAGNOSIS — J449 Chronic obstructive pulmonary disease, unspecified: Secondary | ICD-10-CM | POA: Diagnosis not present

## 2018-08-22 ENCOUNTER — Telehealth: Payer: Self-pay | Admitting: Pulmonary Disease

## 2018-08-22 DIAGNOSIS — J9611 Chronic respiratory failure with hypoxia: Secondary | ICD-10-CM

## 2018-08-22 DIAGNOSIS — J449 Chronic obstructive pulmonary disease, unspecified: Secondary | ICD-10-CM

## 2018-08-22 MED ORDER — ALBUTEROL SULFATE HFA 108 (90 BASE) MCG/ACT IN AERS: 2.0000 | INHALATION_SPRAY | Freq: Four times a day (QID) | RESPIRATORY_TRACT | 6 refills | Status: DC | PRN

## 2018-08-22 NOTE — Telephone Encounter (Signed)
Rx has been sent to pt's preferred pharmacy. Attempted to call pt to let him know this had been done but unable to reach him. Left pt a detailed message letting him know that this had been handled for him. Nothing further needed.

## 2018-08-25 DIAGNOSIS — M1711 Unilateral primary osteoarthritis, right knee: Secondary | ICD-10-CM | POA: Diagnosis not present

## 2018-08-27 ENCOUNTER — Other Ambulatory Visit: Payer: Self-pay | Admitting: *Deleted

## 2018-09-03 DIAGNOSIS — J449 Chronic obstructive pulmonary disease, unspecified: Secondary | ICD-10-CM | POA: Diagnosis not present

## 2018-09-11 ENCOUNTER — Other Ambulatory Visit: Payer: Self-pay | Admitting: Pulmonary Disease

## 2018-09-11 MED ORDER — FLUTICASONE-UMECLIDIN-VILANT 100-62.5-25 MCG/INH IN AEPB: 1.0000 | INHALATION_SPRAY | Freq: Every day | RESPIRATORY_TRACT | 5 refills | Status: DC

## 2018-09-26 ENCOUNTER — Telehealth: Payer: Self-pay | Admitting: Pulmonary Disease

## 2018-09-26 MED ORDER — ALBUTEROL SULFATE (2.5 MG/3ML) 0.083% IN NEBU: 2.5000 mg | INHALATION_SOLUTION | Freq: Four times a day (QID) | RESPIRATORY_TRACT | 2 refills | Status: DC | PRN

## 2018-09-26 NOTE — Telephone Encounter (Signed)
Called and spoke to pt, who requested Rx for albuterol sulfate neb solution.  Per our records, Rx for albuterol neb solution was last refilled by [facility] on 12/09/15 with 12 refills.  Rx for albuterol neb solution has been sent to preferred pharmacy.  Nothing further is needed.

## 2018-10-04 DIAGNOSIS — J449 Chronic obstructive pulmonary disease, unspecified: Secondary | ICD-10-CM | POA: Diagnosis not present

## 2018-10-07 ENCOUNTER — Other Ambulatory Visit: Payer: Self-pay | Admitting: Cardiology

## 2018-10-07 NOTE — Telephone Encounter (Signed)
Pt is a 68 yr old male who saw Dr. Delton See on 12/23/17. Last noted weight was 101.2Kg on 07/22/18. SCr on 02/20/18 was 0.96, Will refill Eliquis 5mg  BID.

## 2018-11-04 DIAGNOSIS — J449 Chronic obstructive pulmonary disease, unspecified: Secondary | ICD-10-CM | POA: Diagnosis not present

## 2018-11-10 ENCOUNTER — Other Ambulatory Visit: Payer: Self-pay | Admitting: Cardiology

## 2018-11-10 NOTE — Telephone Encounter (Signed)
Yes please refill. Thanks. 

## 2018-11-10 NOTE — Telephone Encounter (Signed)
Pt's pharmacy is requesting a refill on loratadine. Would Dr. Delton See like to refill this medication? Please address

## 2018-11-11 ENCOUNTER — Telehealth: Payer: Self-pay | Admitting: Pulmonary Disease

## 2018-11-11 MED ORDER — ALBUTEROL SULFATE (2.5 MG/3ML) 0.083% IN NEBU: 2.5000 mg | INHALATION_SOLUTION | Freq: Four times a day (QID) | RESPIRATORY_TRACT | 2 refills | Status: DC | PRN

## 2018-11-11 NOTE — Telephone Encounter (Signed)
Called and spoke with Patient.  Patient stated that he needed a refill for his albuterol nebs sent to [facility] [address]  Patient requested 30 to 90 days supply. Requested albuterol refill sent to requested pharmacy.  Nothing further at this time.

## 2018-12-03 DIAGNOSIS — J449 Chronic obstructive pulmonary disease, unspecified: Secondary | ICD-10-CM | POA: Diagnosis not present

## 2018-12-06 ENCOUNTER — Other Ambulatory Visit: Payer: Self-pay | Admitting: Cardiology

## 2018-12-08 ENCOUNTER — Other Ambulatory Visit: Payer: Self-pay | Admitting: Cardiology

## 2018-12-08 NOTE — Telephone Encounter (Signed)
Dr. Delton See and/or Pharmacy, can you please review and advise on a different regimen for this pts losartan is on backorder. Please advise.  Thanks!

## 2018-12-08 NOTE — Telephone Encounter (Signed)
I called [facility] and they said they had >1000 of the losartan 50mg  so I called in rx for this.

## 2019-01-03 DIAGNOSIS — J449 Chronic obstructive pulmonary disease, unspecified: Secondary | ICD-10-CM | POA: Diagnosis not present

## 2019-01-05 ENCOUNTER — Other Ambulatory Visit: Payer: Self-pay | Admitting: Cardiology

## 2019-01-05 NOTE — Telephone Encounter (Signed)
Yes please refill 

## 2019-02-02 ENCOUNTER — Other Ambulatory Visit: Payer: Self-pay | Admitting: Pulmonary Disease

## 2019-02-02 DIAGNOSIS — J9611 Chronic respiratory failure with hypoxia: Secondary | ICD-10-CM

## 2019-02-02 DIAGNOSIS — J449 Chronic obstructive pulmonary disease, unspecified: Secondary | ICD-10-CM

## 2019-02-11 ENCOUNTER — Other Ambulatory Visit: Payer: Self-pay | Admitting: Cardiology

## 2019-03-05 DIAGNOSIS — J449 Chronic obstructive pulmonary disease, unspecified: Secondary | ICD-10-CM | POA: Diagnosis not present

## 2019-03-06 ENCOUNTER — Other Ambulatory Visit: Payer: Self-pay | Admitting: Pulmonary Disease

## 2019-03-16 DIAGNOSIS — I4891 Unspecified atrial fibrillation: Secondary | ICD-10-CM | POA: Diagnosis not present

## 2019-03-16 DIAGNOSIS — I10 Essential (primary) hypertension: Secondary | ICD-10-CM | POA: Diagnosis not present

## 2019-03-16 DIAGNOSIS — Z0000 Encounter for general adult medical examination without abnormal findings: Secondary | ICD-10-CM | POA: Diagnosis not present

## 2019-03-16 DIAGNOSIS — E785 Hyperlipidemia, unspecified: Secondary | ICD-10-CM | POA: Diagnosis not present

## 2019-03-19 DIAGNOSIS — R7309 Other abnormal glucose: Secondary | ICD-10-CM | POA: Diagnosis not present

## 2019-03-19 DIAGNOSIS — I4891 Unspecified atrial fibrillation: Secondary | ICD-10-CM | POA: Diagnosis not present

## 2019-03-19 DIAGNOSIS — Z23 Encounter for immunization: Secondary | ICD-10-CM | POA: Diagnosis not present

## 2019-03-19 DIAGNOSIS — E785 Hyperlipidemia, unspecified: Secondary | ICD-10-CM | POA: Diagnosis not present

## 2019-03-19 DIAGNOSIS — Z0000 Encounter for general adult medical examination without abnormal findings: Secondary | ICD-10-CM | POA: Diagnosis not present

## 2019-04-04 DIAGNOSIS — J449 Chronic obstructive pulmonary disease, unspecified: Secondary | ICD-10-CM | POA: Diagnosis not present

## 2019-04-21 DIAGNOSIS — Z1211 Encounter for screening for malignant neoplasm of colon: Secondary | ICD-10-CM | POA: Diagnosis not present

## 2019-05-05 ENCOUNTER — Other Ambulatory Visit: Payer: Self-pay | Admitting: Pulmonary Disease

## 2019-05-05 ENCOUNTER — Other Ambulatory Visit: Payer: Self-pay | Admitting: Cardiology

## 2019-05-05 DIAGNOSIS — J449 Chronic obstructive pulmonary disease, unspecified: Secondary | ICD-10-CM | POA: Diagnosis not present

## 2019-05-05 DIAGNOSIS — I4891 Unspecified atrial fibrillation: Secondary | ICD-10-CM | POA: Diagnosis not present

## 2019-05-05 DIAGNOSIS — Z7901 Long term (current) use of anticoagulants: Secondary | ICD-10-CM | POA: Diagnosis not present

## 2019-05-05 DIAGNOSIS — K921 Melena: Secondary | ICD-10-CM | POA: Diagnosis not present

## 2019-05-05 NOTE — Telephone Encounter (Signed)
Pt last saw Dr Meda Coffee 12/23/17, pt is overdue for follow-up.  Last labs 03/19/19 Creat 1.02 at [facility] per [facility], age 68, weight 101.2kg, based on specified criteria pt is on appropriate dosage of Eliquis 5mg  BID.  Will refill x 1 with note to pt to schedule follow-up appt for future refills.

## 2019-05-07 ENCOUNTER — Other Ambulatory Visit: Payer: Self-pay | Admitting: Gastroenterology

## 2019-05-07 ENCOUNTER — Other Ambulatory Visit: Payer: Self-pay | Admitting: Pulmonary Disease

## 2019-05-07 DIAGNOSIS — K921 Melena: Secondary | ICD-10-CM

## 2019-05-07 DIAGNOSIS — J449 Chronic obstructive pulmonary disease, unspecified: Secondary | ICD-10-CM

## 2019-05-07 DIAGNOSIS — J9611 Chronic respiratory failure with hypoxia: Secondary | ICD-10-CM

## 2019-05-13 ENCOUNTER — Ambulatory Visit
Admission: RE | Admit: 2019-05-13 | Discharge: 2019-05-13 | Disposition: A | Discharge status: Home / Self Care | Payer: Medicare Other | Source: Ambulatory Visit | Attending: Gastroenterology | Admitting: Gastroenterology

## 2019-05-13 DIAGNOSIS — K921 Melena: Secondary | ICD-10-CM

## 2019-05-13 DIAGNOSIS — K5730 Diverticulosis of large intestine without perforation or abscess without bleeding: Secondary | ICD-10-CM | POA: Diagnosis not present

## 2019-05-14 ENCOUNTER — Telehealth: Payer: Self-pay | Admitting: Pulmonary Disease

## 2019-05-14 ENCOUNTER — Other Ambulatory Visit: Payer: Self-pay | Admitting: Pulmonary Disease

## 2019-05-14 MED ORDER — ALBUTEROL SULFATE (2.5 MG/3ML) 0.083% IN NEBU: 2.5000 mg | INHALATION_SOLUTION | Freq: Four times a day (QID) | RESPIRATORY_TRACT | 3 refills | Status: DC | PRN

## 2019-05-14 NOTE — Telephone Encounter (Signed)
Called returned to patient, confirmed medication and pharmacy. Refill sent. Nothing further is needed at this time.

## 2019-05-14 NOTE — Telephone Encounter (Signed)
PT CAN BE REACHED @ [phone].Nicholas Camacho

## 2019-06-01 ENCOUNTER — Encounter: Payer: Self-pay | Admitting: Cardiology

## 2019-06-01 DIAGNOSIS — R195 Other fecal abnormalities: Secondary | ICD-10-CM | POA: Diagnosis not present

## 2019-06-03 ENCOUNTER — Ambulatory Visit (INDEPENDENT_AMBULATORY_CARE_PROVIDER_SITE_OTHER)
Admission: RE | Admit: 2019-06-03 | Discharge: 2019-06-03 | Disposition: A | Discharge status: Home / Self Care | Payer: Medicare Other | Source: Ambulatory Visit | Attending: Acute Care | Admitting: Acute Care

## 2019-06-03 ENCOUNTER — Other Ambulatory Visit: Payer: Self-pay

## 2019-06-03 DIAGNOSIS — Z87891 Personal history of nicotine dependence: Secondary | ICD-10-CM

## 2019-06-03 DIAGNOSIS — Z122 Encounter for screening for malignant neoplasm of respiratory organs: Secondary | ICD-10-CM

## 2019-06-05 ENCOUNTER — Other Ambulatory Visit: Payer: Self-pay | Admitting: Cardiology

## 2019-06-05 ENCOUNTER — Other Ambulatory Visit: Payer: Self-pay | Admitting: *Deleted

## 2019-06-05 DIAGNOSIS — J449 Chronic obstructive pulmonary disease, unspecified: Secondary | ICD-10-CM | POA: Diagnosis not present

## 2019-06-05 DIAGNOSIS — Z87891 Personal history of nicotine dependence: Secondary | ICD-10-CM

## 2019-06-05 DIAGNOSIS — Z122 Encounter for screening for malignant neoplasm of respiratory organs: Secondary | ICD-10-CM

## 2019-06-05 NOTE — Telephone Encounter (Signed)
Eliquis 5mg  refill request received; pt is 68yrs old, weight-101.2kg, Crea- 1.02 on 03/19/2019 via [facility] from [place] PCP, Diagnosis-Afib, and last seen by on Dr. Meda Coffee on 12/23/2017-needs an appt. Dose is appropriate based on dosing criteria. However, pt needs an appt.  Pt had a refill sent on 05/05/2019 and a note was placed that pt is overdue for follow up and needs to schedule an office visit for further refills.  Called the pt and the phone went straight to voicemail and the voicemail identified as Nicholas Camacho, left a message stating to callback to schedule an appt with Dr. Meda Coffee at [phone]; will await for the pt to schedule an appt with Dr. Meda Coffee.

## 2019-06-10 ENCOUNTER — Ambulatory Visit (INDEPENDENT_AMBULATORY_CARE_PROVIDER_SITE_OTHER): Payer: Medicare Other | Admitting: Acute Care

## 2019-06-10 ENCOUNTER — Other Ambulatory Visit: Payer: Self-pay

## 2019-06-10 ENCOUNTER — Encounter: Payer: Self-pay | Admitting: Acute Care

## 2019-06-10 DIAGNOSIS — R9389 Abnormal findings on diagnostic imaging of other specified body structures: Secondary | ICD-10-CM | POA: Diagnosis not present

## 2019-06-10 DIAGNOSIS — J988 Other specified respiratory disorders: Secondary | ICD-10-CM

## 2019-06-10 NOTE — Progress Notes (Signed)
Virtual Visit via Telephone Note  I connected with Nicholas Camacho on 06/10/19 at  4:00 PM EDT by telephone and verified that I am speaking with the correct person using two identifiers.  Location: Patient: At home Provider: [address] [place], [place] , [address]   I discussed the limitations, risks, security and privacy concerns of performing an evaluation and management service by telephone and the availability of in person appointments. I also discussed with the patient that there may be a patient responsible charge related to this service. The patient expressed understanding and agreed to proceed.  I  confirmed date of birth and address to authenticate patient  Identity. My nurse Quentin Ore reviewed medications and ordered any refills required.   History of Present Illness: Pt presents for follow up of his abnormal LDCT. His scan was read as a Lung RADS 4 A :suspicious. I explained that the radiologist favors infection. He states he is not ill. That he does not have fever. He does have some baseline shortness of breath. We will culture his sputum as he is not acutely ill, and we will treat with antibiotic sensitive to growth. We will then repeat CT Chest I 3 months per the Lung RADs protocol. He denies fever, chest pain, orthopnea or hemoptysis. Pt is in agreement with this plan. Observations/Objective:  RADS 4 A :suspicious. Follow up low-dose chest CT without contrast in 3 months >> Clustered tree-in-bud nodularity in the left upper lobe/lingula and medial right lower lobe, favoring infection. Dominant lingular nodules measure up to 9.8 mm.  Assessment and Plan: Abnormal LD CT RADS 4 A :suspicious. Follow up low-dose chest CT without contrast in 3 months >> Clustered tree-in-bud nodularity in the left upper lobe/lingula and medial right lower lobe, favoring infection. Dominant lingular nodules measure up to 9.8 mm. Plan We will collect a sputum specimen We will  send for culture, AFB and Fungal We will treat with antibiotic once sensitivities result Follow up CT chest in 3 months Please contact office for sooner follow up if symptoms do not improve or worsen or seek emergency care   Follow Up Instructions:    I discussed the assessment and treatment plan with the patient. The patient was provided an opportunity to ask questions and all were answered. The patient agreed with the plan and demonstrated an understanding of the instructions.   The patient was advised to call back or seek an in-person evaluation if the symptoms worsen or if the condition fails to improve as anticipated.  I provided 25  minutes of non-face-to-face time during this encounter.   Magdalen Spatz, NP 06/10/2019 5:22 PM

## 2019-06-10 NOTE — Patient Instructions (Signed)
It is good to talk with you today. We will culture your sputum. ( Culture,  AFB and Fungal) Please pick up  a sputum specimen cup at the front desk of our office at [place]  06/11/2019.  Please collect your morning sputum Friday 9/11 and take to the lab at [place] ( Our old office building)  within 4 hours of collection. Directions will be placed in the bag with the sputum cup. We will call you once the cultures have resulted. We will treat you with the antibiotic that is most effective against the bacteria that is growing in your lungs We will repeat the CT scan of the chest at the beginning of December 2020 to re-evaluate the findings after treating your infection. Please call the office if you have any questions.  Please contact office for sooner follow up if symptoms do not improve or worsen or seek emergency care

## 2019-06-12 ENCOUNTER — Other Ambulatory Visit: Payer: Medicare Other

## 2019-06-12 DIAGNOSIS — J988 Other specified respiratory disorders: Secondary | ICD-10-CM | POA: Diagnosis not present

## 2019-06-12 NOTE — Telephone Encounter (Signed)
°*  STAT* If patient is at the pharmacy, call can be transferred to refill team.   1. Which medications need to be refilled? (please list name of each medication and dose if known) Eliquis  2. Which pharmacy/location (including street and city if local pharmacy) is medication to be sent to? [place], [place]  3. Do they need a 30 day or 90 day supply? 30 and refills

## 2019-06-12 NOTE — Addendum Note (Signed)
Addended by: Boris Lown B on: 06/12/2019 09:47 AM   Modules accepted: Orders

## 2019-06-12 NOTE — Telephone Encounter (Signed)
Pt did call back and has an appt with Dr Meda Coffee scheduled for 06/19/19.  Will refill Eliquis rx x 1 to get pt to appt.

## 2019-06-17 ENCOUNTER — Telehealth: Payer: Self-pay | Admitting: *Deleted

## 2019-06-17 ENCOUNTER — Encounter: Payer: Self-pay | Admitting: *Deleted

## 2019-06-17 NOTE — Telephone Encounter (Signed)
.   Virtual Visit Pre-Appointment Phone Call  "(Name), I am calling you today to discuss your upcoming appointment. We are currently trying to limit exposure to the virus that causes COVID-19 by seeing patients at home rather than in the office."  1. "What is the BEST phone number to call the day of the visit?" - include this in appointment notes--YES UPDATED  2. Do you have or have access to (through a family member/friend) a smartphone with video capability that we can use for your visit?" a. If yes - list this number in appt notes as cell (if different from BEST phone #) and list the appointment type as a VIDEO visit in appointment notes-YES UPDATED   3. Confirm consent - "In the setting of the current Covid19 crisis, you are scheduled for a ( video) visit with DR. NELSON ON 9/18 AT 0940.  Just as we do with many in-office visits, in order for you to participate in this visit, we must obtain consent.  If you'd like, I can send this to your mychart (if signed up) or email for you to review.  Otherwise, I can obtain your verbal consent now.  All virtual visits are billed to your insurance company just like a normal visit would be.  By agreeing to a virtual visit, we'd like you to understand that the technology does not allow for your provider to perform an examination, and thus may limit your provider's ability to fully assess your condition. If your provider identifies any concerns that need to be evaluated in person, we will make arrangements to do so.  Finally, though the technology is pretty good, we cannot assure that it will always work on either your or our end, and in the setting of a video visit, we may have to convert it to a phone-only visit.  In either situation, we cannot ensure that we have a secure connection.  Are you willing to proceed?" STAFF: Did the patient verbally acknowledge consent to telehealth visit? Document YES/NO here: YES OBTAINED VERBAL CONSENT AS WELL AS SENT IN  MYCHART TO REVIEW  4. Advise patient to be prepared - "Two hours prior to your appointment, go ahead and check your blood pressure, pulse, oxygen saturation, and your weight (if you have the equipment to check those) and write them all down. When your visit starts, your provider will ask you for this information. If you have an Apple Watch or Kardia device, please plan to have heart rate information ready on the day of your appointment. Please have a pen and paper handy nearby the day of the visit as well."-PT DOES NOT HAVE A BP CUFF BUT WILL WEIGH HIMSELF THAT MORNING  5. Give patient instructions for MyChart download to smartphone OR Doximity/Doxy.me as below if video visit (depending on what platform provider is using)-YES DOXY.ME INSTRUCTIONS GIVEN  Inform patient they will receive a phone call 15 minutes prior to their appointment time (may be from unknown caller ID) so they should be prepared to answer-YES AWARE   TELEPHONE CALL NOTE  Nicholas Camacho has been deemed a candidate for a follow-up tele-health visit to limit community exposure during the Covid-19 pandemic. I spoke with the patient via phone to ensure availability of phone/video source, confirm preferred email & phone number, and discuss instructions and expectations.  I reminded Nicholas NakayamaCharles M Robin to be prepared with any vital sign and/or heart rhythm information that could potentially be obtained via home monitoring, at the time of his  visit. I reminded JAYMIE MISCH to expect a phone call prior to his visit.  Nuala Alpha, LPN 01/07/1447 18:56 PM    IF USING DOXIMITY or DOXY.ME - The patient will receive a link just prior to their visit by text.-YES AWARE     FULL LENGTH CONSENT FOR TELE-HEALTH VISIT   I hereby voluntarily request, consent and authorize [facility] and its employed or contracted physicians, physician assistants, nurse practitioners or other licensed health care professionals (the  Practitioner), to provide me with telemedicine health care services (the Services") as deemed necessary by the treating Practitioner. I acknowledge and consent to receive the Services by the Practitioner via telemedicine. I understand that the telemedicine visit will involve communicating with the Practitioner through live audiovisual communication technology and the disclosure of certain medical information by electronic transmission. I acknowledge that I have been given the opportunity to request an in-person assessment or other available alternative prior to the telemedicine visit and am voluntarily participating in the telemedicine visit.  I understand that I have the right to withhold or withdraw my consent to the use of telemedicine in the course of my care at any time, without affecting my right to future care or treatment, and that the Practitioner or I may terminate the telemedicine visit at any time. I understand that I have the right to inspect all information obtained and/or recorded in the course of the telemedicine visit and may receive copies of available information for a reasonable fee.  I understand that some of the potential risks of receiving the Services via telemedicine include:   Delay or interruption in medical evaluation due to technological equipment failure or disruption;  Information transmitted may not be sufficient (e.g. poor resolution of images) to allow for appropriate medical decision making by the Practitioner; and/or   In rare instances, security protocols could fail, causing a breach of personal health information.  Furthermore, I acknowledge that it is my responsibility to provide information about my medical history, conditions and care that is complete and accurate to the best of my ability. I acknowledge that Practitioner's advice, recommendations, and/or decision may be based on factors not within their control, such as incomplete or inaccurate data provided by me  or distortions of diagnostic images or specimens that may result from electronic transmissions. I understand that the practice of medicine is not an exact science and that Practitioner makes no warranties or guarantees regarding treatment outcomes. I acknowledge that I will receive a copy of this consent concurrently upon execution via email to the email address I last provided but may also request a printed copy by calling the office of [facility].    I understand that my insurance will be billed for this visit.   I have read or had this consent read to me.  I understand the contents of this consent, which adequately explains the benefits and risks of the Services being provided via telemedicine.   I have been provided ample opportunity to ask questions regarding this consent and the Services and have had my questions answered to my satisfaction.  I give my informed consent for the services to be provided through the use of telemedicine in my medical care  By participating in this telemedicine visit I agree to the above.  PT GAVE VERBAL CONSENT FOR DR Meda Coffee TO TREAT HIM VIA VIDEO VISIT ON 9/18 AS WELL AS SENT TO MYCHART TO REVIEW

## 2019-06-17 NOTE — Progress Notes (Signed)
Virtual Visit via Video Note   This visit type was conducted due to national recommendations for restrictions regarding the COVID-19 Pandemic (e.g. social distancing) in an effort to limit this patient's exposure and mitigate transmission in our community.  Due to his co-morbid illnesses, this patient is at least at moderate risk for complications without adequate follow up.  This format is felt to be most appropriate for this patient at this time.  All issues noted in this document were discussed and addressed.  A limited physical exam was performed with this format.  Please refer to the patient's chart for his consent to telehealth for [facility].   Date:  06/19/2019   ID:  Nicholas Camacho, DOB 11-05-1950, MRN [id number]  Patient Location: Home Provider Location: Home  PCP:  Farris Has, MD  Cardiologist:  Tobias Alexander, MD  Electrophysiologist:  None   Evaluation Performed:  Follow-Up Visit  Chief Complaint/reason for visit:  1 year follow up  History of Present Illness:    Nicholas Camacho is a 68 y.o. male with history of PAF chads BASC equals 3 on Eliquis (had GI bleed on Xarelto), hypertension, HLD, COPD, OSA, small AAA and iliac aneurysm.  Coronary CTA 08/20/17 for mild fatigue on exertion showed calcium score of 3244 which was the 99th percentile for age and sex.  Moderate stenosis in the LAD and RCA with suspicion for severe stenosis in the mid RCA.  Cardiac catheterization 08/30/17 showed moderate nonobstructive calcific stenosis in the RCA with mild nonobstructive calcific stenosis in the LAD and circumflex normal LV function.  Medical management was recommended as well as smoking cessation.  See below for complete details.  12/23/2017 - the patient was complaining of dyspnea on exertion especially when walking stairs. It was believed to be related to COPD, diltiazem, ASA was stopped, losartan was increased and inhalers were prescribed.   06/19/2019 - 18 months  follow up, he is doing well from cardiac standpoint, he has retired a year ago and helps with his grandkids. He has been evaluated for hematochezia, underwent colonoscopy with findings of hemorrhoids only. Dr Kateri Plummer obtained his labs and he has no anemia (we will obtain those). He also underwent chest CTA that showed:  1. Lung-RADS 4A, suspicious. Follow up low-dose chest CT without contrast in 3 months (please use the following order, "CT CHEST LCS NODULE FOLLOW-UP W/O CM") is recommended. 2. Clustered tree-in-bud nodularity in the left upper lobe/lingula and medial right lower lobe, favoring infection. Dominant lingular nodules measure up to 9.8 mm. He has been started on Augmentin for presumed pneumonia. Denies chest pain, LE edema, he is complaint with his meds.  The patient does not have symptoms concerning for COVID-19 infection (fever, chills, cough, or new shortness of breath).   Past Medical History:  Diagnosis Date  . Disturbance of skin sensation 06/24/2013  . Elevated cholesterol   . History of echocardiogram    Echo 4/17: EF 60-65%, no RWMA, Gr 2 DD  . Sleep disturbance, unspecified 06/24/2013   Past Surgical History:  Procedure Laterality Date  . ESOPHAGOGASTRODUODENOSCOPY N/A 01/08/2016   Procedure: ESOPHAGOGASTRODUODENOSCOPY (EGD);  Surgeon: Carman Ching, MD;  Location: [facility] ENDOSCOPY;  Service: Endoscopy;  Laterality: N/A;  . fractured pelvis    . INGUINAL HERNIA REPAIR Bilateral   . LEFT HEART CATH AND CORONARY ANGIOGRAPHY N/A 08/30/2017   Procedure: LEFT HEART CATH AND CORONARY ANGIOGRAPHY;  Surgeon: Kathleene Hazel, MD;  Location: [facility] INVASIVE CV LAB;  Service: Cardiovascular;  Laterality: N/A;  . reconstruct left heel  2002  . TONSILLECTOMY       Current Meds  Medication Sig  . albuterol (PROVENTIL) (2.5 MG/3ML) 0.083% nebulizer solution Take 3 mLs (2.5 mg total) by nebulization every 6 (six) hours as needed for wheezing or shortness of breath.  [name] atorvastatin  (LIPITOR) 80 MG tablet TAKE 1 TABLET BY MOUTH EVERY DAY  . cetirizine (ZYRTEC) 10 MG tablet Take 10 mg by mouth daily.  [name] ELIQUIS 5 MG TABS tablet TAKE 1 TABLET BY MOUTH TWICE DAILY( OVERDUE FOR FOLLOW UP, NEEDS TO SCHEDULE FOR FURTHER REFILLS)  . fluticasone (FLONASE) 50 MCG/ACT nasal spray Place 2 sprays into both nostrils 2 (two) times daily.  [name] losartan (COZAAR) 50 MG tablet TAKE 1 TABLET(50 MG) BY MOUTH DAILY  . TRELEGY ELLIPTA 100-62.5-25 MCG/INH AEPB INHALE 1 PUFF INTO THE LUNGS DAILY  . VENTOLIN HFA 108 (90 Base) MCG/ACT inhaler INHALE 2 PUFFS BY MOUTH EVERY 6 HOURS AS NEEDED FOR WHEEZING OR SHORTNESS OF BREATH   Current Facility-Administered Medications for the 06/19/19 encounter (Telemedicine) with Dorothy Spark, MD  Medication  . ipratropium-albuterol (DUONEB) 0.5-2.5 (3) MG/3ML nebulizer solution 3 mL     Allergies:   Morphine and related   Social History   Tobacco Use  . Smoking status: Former Smoker    Packs/day: 1.50    Years: 45.00    Pack years: 67.50    Types: Cigarettes    Quit date: 11/19/2015    Years since quitting: 3.5  . Smokeless tobacco: Never Used  . Tobacco comment: pt quit smoking!! 11/19/15  Substance Use Topics  . Alcohol use: No    Alcohol/week: 0.0 standard drinks  . Drug use: No     Family Hx: The patient's family history includes Alcoholism in his sister; Diabetes in his mother; Drug abuse in his sister; Kidney failure in his father; Prostate cancer in his father.  ROS:   Please see the history of present illness.    All other systems reviewed and are negative.   Prior CV studies:   The following studies were reviewed today:  TTE: 12/2015  - Left ventricle: The cavity size was normal. Wall thickness was   normal. Systolic function was normal. The estimated ejection   fraction was in the range of 60% to 65%. Wall motion was normal;   there were no regional wall motion abnormalities. Features are   consistent with a pseudonormal left  ventricular filling pattern,   with concomitant abnormal relaxation and increased filling   pressure (grade 2 diastolic dysfunction).  Labs/Other Tests and Data Reviewed:    EKG:  No ECG reviewed.  Recent Labs: No results found for requested labs within last 8760 hours.   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 He has quit smoking two years ago. He is asymptomatic.  Hyperlipidemia on Lipitor - on 10/2017 LDL 44, TG 42, HDL 32. We will check at the next visit.  PAF on Eliquis doing well- had a bleed on Xarelto, he has hemorrhoidal bleeding but no anemia, we will obtain labs.  Essential hypertension - no BP cuff, we will arrange a free one.  Lung nodule and pneumonia - follow up scan arranged for December 2020, we will follow  COVID-19 Education: The signs and symptoms of COVID-19 were discussed with the patient and how to seek care for testing (follow up with PCP or arrange E-visit).  The importance of social distancing was discussed today.  Time:   Today, I have spent 25 minutes with the patient with telehealth technology discussing the above problems.    Medication Adjustments/Labs and Tests Ordered: Current medicines are reviewed at length with the patient today.  Concerns regarding medicines are outlined above.   Tests Ordered: No orders of the defined types were placed in this encounter.  Medication  Changes: No orders of the defined types were placed in this encounter.  Follow Up:  In Person in 3 month(s)  Signed, Tobias AlexanderKatarina Norman Piacentini, MD  06/19/2019 11:01 AM    [facility]

## 2019-06-18 ENCOUNTER — Telehealth: Payer: Self-pay | Admitting: Pulmonary Disease

## 2019-06-18 MED ORDER — AMOXICILLIN-POT CLAVULANATE 875-125 MG PO TABS: 1.0000 | ORAL_TABLET | Freq: Two times a day (BID) | ORAL | 0 refills | Status: DC

## 2019-06-18 NOTE — Telephone Encounter (Signed)
Advised pt of results. Pt understood and nothing further is needed. Rs sent into [facility] on [address].    Lauraine Rinne, NP      9:02 AM Note   06/18/2019 0 859  Sputum culture results from 06/12/2019 have preliminary results.  This preliminary results for fungal culture are negative.  AFB negative.  Respiratory sputum showing moderate growth of H. Influenzae.  We can treat this with Augmentin.  Please place an order for:  Augmentin >>> Take 1 875-125 mg tablet every 12 hours for the next 7 days >>> Take with food  Please also notify patient and sputum culture results will continue to grow out for a total of 6 weeks.  If we receive any other abnormal results then we will contact the patient and discuss treatment.  Will route to Eric Form NP as Juluis Rainier.  Wyn Quaker FNP

## 2019-06-18 NOTE — Telephone Encounter (Signed)
06/18/2019 0 859  Sputum culture results from 06/12/2019 have preliminary results.  This preliminary results for fungal culture are negative.  AFB negative.  Respiratory sputum showing moderate growth of H. Influenzae.  We can treat this with Augmentin.  Please place an order for:  Augmentin >>> Take 1 875-125 mg tablet every 12 hours for the next 7 days >>> Take with food  Please also notify patient and sputum culture results will continue to grow out for a total of 6 weeks.  If we receive any other abnormal results then we will contact the patient and discuss treatment.  Will route to Eric Form NP as Juluis Rainier.  Wyn Quaker FNP

## 2019-06-18 NOTE — Telephone Encounter (Signed)
lmtcb for pt.  

## 2019-06-19 ENCOUNTER — Encounter: Payer: Self-pay | Admitting: Cardiology

## 2019-06-19 ENCOUNTER — Other Ambulatory Visit: Payer: Self-pay

## 2019-06-19 ENCOUNTER — Telehealth (HOSPITAL_COMMUNITY): Payer: Self-pay | Admitting: Licensed Clinical Social Worker

## 2019-06-19 ENCOUNTER — Telehealth (INDEPENDENT_AMBULATORY_CARE_PROVIDER_SITE_OTHER): Payer: Medicare Other | Admitting: Cardiology

## 2019-06-19 ENCOUNTER — Encounter: Payer: Self-pay | Admitting: *Deleted

## 2019-06-19 DIAGNOSIS — I10 Essential (primary) hypertension: Secondary | ICD-10-CM

## 2019-06-19 DIAGNOSIS — J449 Chronic obstructive pulmonary disease, unspecified: Secondary | ICD-10-CM | POA: Diagnosis not present

## 2019-06-19 DIAGNOSIS — I2510 Atherosclerotic heart disease of native coronary artery without angina pectoris: Secondary | ICD-10-CM | POA: Diagnosis not present

## 2019-06-19 DIAGNOSIS — E785 Hyperlipidemia, unspecified: Secondary | ICD-10-CM | POA: Diagnosis not present

## 2019-06-19 MED ORDER — NITROGLYCERIN 0.4 MG SL SUBL: 0.4000 mg | SUBLINGUAL_TABLET | SUBLINGUAL | 2 refills | Status: DC | PRN

## 2019-06-19 NOTE — Patient Instructions (Addendum)
Medication Instructions:   Your physician recommends that you continue on your current medications as directed. Please refer to the Current Medication list given to you today.  If you need a refill on your cardiac medications before your next appointment, please call your pharmacy.    Lab work:  WE WILL GET YOUR RECENT LABS FAXED TO OUR OFFICE FROM DR. MORROW PCP  If you have labs (blood work) drawn today and your tests are completely normal, you will receive your results only by: [name] MyChart Message (if you have MyChart) OR . A paper copy in the mail If you have any lab test that is abnormal or we need to change your treatment, we will call you to review the results.    Follow-Up:  Your physician wants you to follow-up in: IN 5 MONTHS IN PERSON WITH DR. Johann Capers will receive a reminder letter in the mail two months in advance. If you don't receive a letter, please call our office to schedule the follow-up appointment.  PLEASE CALL THE OFFICE IN November SO I CAN SCHEDULE YOUR APPOINTMENT FOR EARLY January TO SEE DR. Meda Coffee IN THE OFFICE.     Any Other Special Instructions Will Be Listed Below (If Applicable).  I WAS ABLE TO GET YOU A FREE BP CUFF FROM OUR SOCIAL WORKER AND SHE WILL BE MAILING THIS TO YOUR HOME ADDRESS TO HAVE AND USE.

## 2019-06-19 NOTE — Telephone Encounter (Signed)
CSW consulted to assist patient in getting BP cuff so he can monitor his BP at home.  CSW spoke with pt and confirmed he was agreeable to having BP monitor sent to his home and confirmed shipping address.  Order placed and anticipated delivery over the weekend.  Jorge Ny, LCSW Clinical Social Worker [facility] Desk#: [phone] Cell#: [phone]

## 2019-06-23 ENCOUNTER — Other Ambulatory Visit: Payer: Self-pay | Admitting: Pulmonary Disease

## 2019-06-23 DIAGNOSIS — J9611 Chronic respiratory failure with hypoxia: Secondary | ICD-10-CM

## 2019-06-23 DIAGNOSIS — J449 Chronic obstructive pulmonary disease, unspecified: Secondary | ICD-10-CM

## 2019-07-01 ENCOUNTER — Telehealth: Payer: Self-pay | Admitting: Pulmonary Disease

## 2019-07-01 DIAGNOSIS — J449 Chronic obstructive pulmonary disease, unspecified: Secondary | ICD-10-CM

## 2019-07-01 DIAGNOSIS — J9611 Chronic respiratory failure with hypoxia: Secondary | ICD-10-CM

## 2019-07-01 MED ORDER — VENTOLIN HFA 108 (90 BASE) MCG/ACT IN AERS: INHALATION_SPRAY | RESPIRATORY_TRACT | 1 refills | Status: DC

## 2019-07-01 NOTE — Telephone Encounter (Signed)
Spoke with pt and advised rx for Ventolin sent to [facility]. Nothing further is needed.

## 2019-07-04 ENCOUNTER — Other Ambulatory Visit: Payer: Self-pay | Admitting: Pulmonary Disease

## 2019-07-04 ENCOUNTER — Other Ambulatory Visit: Payer: Self-pay | Admitting: Cardiology

## 2019-07-05 DIAGNOSIS — J449 Chronic obstructive pulmonary disease, unspecified: Secondary | ICD-10-CM | POA: Diagnosis not present

## 2019-07-06 NOTE — Telephone Encounter (Signed)
Prescription refill request for Eliquis received.  Last office visit: Meda Coffee (06-19-2019) Scr: 1.02 (03-19-2019) Age: 68 y.o. Weight: 101.2 kg  Prescription refill sent.

## 2019-07-13 LAB — RESPIRATORY CULTURE OR RESPIRATORY AND SPUTUM CULTURE
MICRO NUMBER:: 871430
SPECIMEN QUALITY:: ADEQUATE

## 2019-07-13 LAB — FUNGUS CULTURE W SMEAR
MICRO NUMBER:: 871429
SMEAR:: NONE SEEN
SPECIMEN QUALITY:: ADEQUATE

## 2019-07-27 LAB — AFB CULTURE WITH SMEAR (NOT AT ARMC)
Acid Fast Culture: NEGATIVE
Acid Fast Smear: NEGATIVE

## 2019-07-29 DIAGNOSIS — Z20828 Contact with and (suspected) exposure to other viral communicable diseases: Secondary | ICD-10-CM | POA: Diagnosis not present

## 2019-08-03 ENCOUNTER — Other Ambulatory Visit: Payer: Self-pay | Admitting: Cardiology

## 2019-08-04 DIAGNOSIS — K921 Melena: Secondary | ICD-10-CM | POA: Diagnosis not present

## 2019-08-05 DIAGNOSIS — J449 Chronic obstructive pulmonary disease, unspecified: Secondary | ICD-10-CM | POA: Diagnosis not present

## 2019-08-05 DIAGNOSIS — M1711 Unilateral primary osteoarthritis, right knee: Secondary | ICD-10-CM | POA: Diagnosis not present

## 2019-08-05 DIAGNOSIS — M1712 Unilateral primary osteoarthritis, left knee: Secondary | ICD-10-CM | POA: Diagnosis not present

## 2019-08-05 DIAGNOSIS — M170 Bilateral primary osteoarthritis of knee: Secondary | ICD-10-CM | POA: Diagnosis not present

## 2019-08-17 ENCOUNTER — Other Ambulatory Visit: Payer: Self-pay | Admitting: Pulmonary Disease

## 2019-08-24 NOTE — Telephone Encounter (Signed)
Can someone please check and see when this pt can have his low dose ct chest? He is sending [place] an email about getting this scheduled, thanks

## 2019-08-26 NOTE — Telephone Encounter (Signed)
Nicholas Camacho has scheduled CT and has given info to pt.  Nothing further needed.

## 2019-08-31 ENCOUNTER — Other Ambulatory Visit: Payer: Self-pay | Admitting: Pulmonary Disease

## 2019-09-03 ENCOUNTER — Telehealth: Payer: Self-pay | Admitting: Pulmonary Disease

## 2019-09-03 ENCOUNTER — Other Ambulatory Visit: Payer: Self-pay

## 2019-09-03 DIAGNOSIS — J9611 Chronic respiratory failure with hypoxia: Secondary | ICD-10-CM

## 2019-09-03 DIAGNOSIS — J449 Chronic obstructive pulmonary disease, unspecified: Secondary | ICD-10-CM

## 2019-09-03 MED ORDER — VENTOLIN HFA 108 (90 BASE) MCG/ACT IN AERS: INHALATION_SPRAY | RESPIRATORY_TRACT | 1 refills | Status: DC

## 2019-09-03 MED ORDER — TRELEGY ELLIPTA 100-62.5-25 MCG/INH IN AEPB: 1.0000 | INHALATION_SPRAY | Freq: Every day | RESPIRATORY_TRACT | 5 refills | Status: DC

## 2019-09-03 NOTE — Telephone Encounter (Signed)
Called and spoke with pt. Verified med pt was needing a refill on and also the preferred pharmacy and sent refill to pharmacy for pt. Nothing further needed.

## 2019-09-04 DIAGNOSIS — J449 Chronic obstructive pulmonary disease, unspecified: Secondary | ICD-10-CM | POA: Diagnosis not present

## 2019-09-07 ENCOUNTER — Other Ambulatory Visit: Payer: Self-pay

## 2019-09-07 ENCOUNTER — Ambulatory Visit (INDEPENDENT_AMBULATORY_CARE_PROVIDER_SITE_OTHER)
Admission: RE | Admit: 2019-09-07 | Discharge: 2019-09-07 | Disposition: A | Discharge status: Home / Self Care | Payer: Medicare Other | Source: Ambulatory Visit | Attending: Acute Care | Admitting: Acute Care

## 2019-09-07 DIAGNOSIS — Z87891 Personal history of nicotine dependence: Secondary | ICD-10-CM

## 2019-09-07 DIAGNOSIS — J439 Emphysema, unspecified: Secondary | ICD-10-CM | POA: Diagnosis not present

## 2019-09-07 DIAGNOSIS — Z122 Encounter for screening for malignant neoplasm of respiratory organs: Secondary | ICD-10-CM

## 2019-09-07 DIAGNOSIS — R918 Other nonspecific abnormal finding of lung field: Secondary | ICD-10-CM

## 2019-09-11 ENCOUNTER — Other Ambulatory Visit: Payer: Self-pay | Admitting: *Deleted

## 2019-09-11 DIAGNOSIS — M1711 Unilateral primary osteoarthritis, right knee: Secondary | ICD-10-CM | POA: Diagnosis not present

## 2019-09-11 DIAGNOSIS — Z87891 Personal history of nicotine dependence: Secondary | ICD-10-CM

## 2019-09-14 ENCOUNTER — Telehealth (INDEPENDENT_AMBULATORY_CARE_PROVIDER_SITE_OTHER): Payer: Medicare Other | Admitting: Acute Care

## 2019-09-14 ENCOUNTER — Encounter: Payer: Self-pay | Admitting: Acute Care

## 2019-09-14 DIAGNOSIS — J441 Chronic obstructive pulmonary disease with (acute) exacerbation: Secondary | ICD-10-CM | POA: Diagnosis not present

## 2019-09-14 DIAGNOSIS — J069 Acute upper respiratory infection, unspecified: Secondary | ICD-10-CM

## 2019-09-14 DIAGNOSIS — Z122 Encounter for screening for malignant neoplasm of respiratory organs: Secondary | ICD-10-CM | POA: Diagnosis not present

## 2019-09-14 DIAGNOSIS — J309 Allergic rhinitis, unspecified: Secondary | ICD-10-CM | POA: Diagnosis not present

## 2019-09-14 MED ORDER — AMOXICILLIN-POT CLAVULANATE 875-125 MG PO TABS: 1.0000 | ORAL_TABLET | Freq: Two times a day (BID) | ORAL | 0 refills | Status: DC

## 2019-09-14 NOTE — Progress Notes (Addendum)
Virtual Visit via Video Note  I connected with Nicholas Camacho on 09/14/19 at  9:30 AM EST by a video enabled telemedicine application and verified that I am speaking with the correct person using two identifiers.  Location: Patient: At home Provider: In the office at [address] [place], [place], [address]   I discussed the limitations of evaluation and management by telemedicine and the availability of in person appointments. The patient expressed understanding and agreed to proceed.  History of Present Illness: Pt.presents for follow up of low dose CT Chest.  His scan was read as a Lung RADS 2: nodules that are benign in appearance and behavior with a very low likelihood of becoming a clinically active cancer due to size or lack of growth. Recommendation per radiology is for a repeat LDCT in 12 months. There was  Notation that the previously noted LUL nodule now represents features that likely reflect infectious / inflammatory changes.  There was also notation of a cluster micro nodules  of tree in bud pattern that favors infection. Upon talking with the patient, he has had a sputum color change from clear to green. He states he has not felt poorly, he denies any fever, or malaise or fatigue. He has been having worsening sinus issues, with drainage. He states he has not been using his Zyrtec, or Flonase regularly as he needs to do. He denies any change in color of secretions from his nose, denies any pain, pressure or tenderness below or above his eyes.  He has had his flu shot .He is using his Trelegy daily as directed. He needs Primary pulmonologist Reassignment  as he was a [name] patient .   Physical Assessment as able per video call General- No distress,  A&Ox3, speaks in full sentences ENT: NCAT Chest: Bilateral excursion, no nasal flaring or sternal retraction    Abd.: Soft Non-tender Ext: No clubbing cyanosis, edema Neuro:  normal strength Skin: No rashes, warm and  dry Psych: normal mood and behavior   Observations/Objective: Sputum Culture 06/2019  Fungal  06/2019 >> Negative Respiratory Culture 06/2019>> H Flu  AFB >> Negative  09/07/2019 Low Dose CT Lung-RADS 2, benign appearance or behavior. Continue annual screening with low-dose chest CT without contrast in 12 months. 2. The dominant nodular opacity seen in the anterior left upper lobe on the previous study now represents a cluster of micro nodules having a configuration suggesting tree-in-bud pattern. Features likely reflect infectious/inflammatory etiology and atypical infection would be a consideration. 3.  Emphysema. (ICD10-J43.9) 4.  Aortic Atherosclerois (ICD10-170.0)   06/03/2019 Abnormal LD CT RADS 4 A :suspicious. Follow up low-dose chest CT without contrast in 3 months >> Clustered tree-in-bud nodularity in the left upper lobe/lingula and medial right lower lobe, favoring infection. Dominant lingular nodules measure up to 9.8 mm. Plan We will collect a sputum specimen We will send for culture, AFB and Fungal We will treat with antibiotic once sensitivities result Follow up CT chest in 3 months Please contact office for sooner follow up if symptoms do not improve or worsen or seek emergency care   10/23/2013 PFT's  Results for Nicholas Camacho, Nicholas Camacho (MRN [id number]) as of 09/14/2019 11:30  Ref. Range 10/23/2013 11:50  FVC-Pre Latest Units: L 4.11  FVC-%Pred-Pre Latest Units: % 93  FEV1-Pre Latest Units: L 2.38  FEV1-%Pred-Pre Latest Units: % 72  Pre FEV1/FVC ratio Latest Units: % 58  FEV1FVC-%Pred-Pre Latest Units: % 77  FEF 25-75 Pre Latest Units: L/sec 1.21  FEF2575-%Pred-Pre Latest Units: % 44  FEV6-Pre Latest Units: L 4.01  FEV6-%Pred-Pre Latest Units: % 96  Pre FEV6/FVC Ratio Latest Units: % 98  FEV6FVC-%Pred-Pre Latest Units: % 102  FVC-Post Latest Units: L 4.12  FVC-%Pred-Post Latest Units: % 94  FVC-%Change-Post Latest Units: % 0  FEV1-Post Latest Units: L 2.41   FEV1-%Pred-Post Latest Units: % 73  FEV1-%Change-Post Latest Units: % 1  Post FEV1/FVC ratio Latest Units: % 58  FEV1FVC-%Change-Post Latest Units: % 1  FEF 25-75 Post Latest Units: L/sec 1.28  FEF2575-%Pred-Post Latest Units: % 47  FEF2575-%Change-Post Latest Units: % 5  FEV6-Post Latest Units: L 4.02  FEV6-%Pred-Post Latest Units: % 96  FEV6-%Change-Post Latest Units: % 0  Post FEV6/FVC ratio Latest Units: % 97  FEV6FVC-%Pred-Post Latest Units: % 102  FEV6FVC-%Change-Post Latest Units: % 0  TLC Latest Units: L 8.64  TLC % pred Latest Units: % 130  RV Latest Units: L 4.49  RV % pred Latest Units: % 206  DLCO unc Latest Units: ml/min/mmHg 21.40  DLCO unc % pred Latest Units: % 72  DL/VA Latest Units: ml/min/mmHg/L 3.26  DL/VA % pred Latest Units: % 72    Assessment and Plan: Cluster of micro nodules having a configuration suggesting tree-in-bud pattern.  Features likely reflect infectious/inflammatory etiology and atypical infection would be a consideration. Green sputum Afebrile Recent sputum cultures were + for H Flu( 06/2019) Plan We will send in a prescription for Augmentin 875-125 one tablet every 12 hours for 7 days  Take with food Follow up Video Visit with Nicholas Camacho 09/22/2019 at 10:30 am to ensure improvement  COPD Last PFT's were 2015 Plan Continue Trelegy 1 puff once daily  Rinse mouth after use  Needs repeat PFT's once he is better to stage disease  Allergic rhinitis Plan Resume Zyrtec daily Resume Flonase daily Add nasal saline as needed.  Lung Cancer Screening Plan Follow up Low Dose CT 08/2020 Counseled on continued smoking cessation  Follow Up Instructions: 12/22 at 10:30 am   I discussed the assessment and treatment plan with the patient. The patient was provided an opportunity to ask questions and all were answered. The patient agreed with the plan and demonstrated an understanding of the instructions.   The patient was advised to call back  or seek an in-person evaluation if the symptoms worsen or if the condition fails to improve as anticipated.  I provided 40 minutes of non-face-to-face time during this encounter.   Bevelyn Ngo, NP 09/14/2019 11:38 AM

## 2019-09-14 NOTE — Patient Instructions (Addendum)
  It was good to talk with you today. We will send in a prescription for Augmentin 875-125 one tablet every 12 hours for 7 days  Take with food Continue Trelegy 1 puff once daily  Rinse mouth after use  Resume Zyrtec daily Resume Flonase daily Add nasal saline as needed. Follow up LDCT 08/2020 Follow up Video Visit with Wyn Quaker 09/22/2019 at 10:30 am to ensure improvement We will reassign you to another physician at that visit. Please contact office for sooner follow up if symptoms do not improve or worsen or seek emergency care

## 2019-09-14 NOTE — Addendum Note (Signed)
Addended by: Eric Form F on: 09/14/2019 01:53 PM   Modules accepted: Level of Service

## 2019-09-15 ENCOUNTER — Other Ambulatory Visit: Payer: Self-pay

## 2019-09-15 MED ORDER — ALBUTEROL SULFATE (2.5 MG/3ML) 0.083% IN NEBU: INHALATION_SOLUTION | RESPIRATORY_TRACT | 2 refills | Status: DC

## 2019-09-16 DIAGNOSIS — I4891 Unspecified atrial fibrillation: Secondary | ICD-10-CM | POA: Diagnosis not present

## 2019-09-16 DIAGNOSIS — E1169 Type 2 diabetes mellitus with other specified complication: Secondary | ICD-10-CM | POA: Diagnosis not present

## 2019-09-16 DIAGNOSIS — I10 Essential (primary) hypertension: Secondary | ICD-10-CM | POA: Diagnosis not present

## 2019-09-16 DIAGNOSIS — E785 Hyperlipidemia, unspecified: Secondary | ICD-10-CM | POA: Diagnosis not present

## 2019-09-18 DIAGNOSIS — M1711 Unilateral primary osteoarthritis, right knee: Secondary | ICD-10-CM | POA: Diagnosis not present

## 2019-09-21 NOTE — Progress Notes (Signed)
Virtual Visit via Video Note  I connected with Nicholas Camacho on 09/22/19 at 10:30 AM EST by a video enabled telemedicine application and verified that I am speaking with the correct person using two identifiers.  Location: Patient: Home Provider: Office - [facility] - [address] [place], [address], [place], [place] [address]  I discussed the limitations of evaluation and management by telemedicine and the availability of in person appointments. The patient expressed understanding and agreed to proceed. I also discussed with the patient that there may be a patient responsible charge related to this service. The patient expressed understanding and agreed to proceed.  Patient consented to consult via telephone: Yes People present and their role in pt care: Pt   History of Present Illness:  68 year old male former smoker followed in our office for COPD GOLD II.  The patient has not tolerated prednisone taper as well as he had significant fluid retention previously.  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  PMH: AFIB (on eliquis), HTN, CAD, Hyperlipidemia Smoker/ Smoking History: Former smoker. Quit 2017. 67.5 pack year history. Lung RADs2 - repeat in 1 year (04/2019) Maintenance:  Trelegy Ellipta Pt of: Dr. Kendrick Fries  Chief complaint: Follow-up, recently completed antibiotic course  68 year old male former smoker followed in our office for COPD.  He is a member of the lung cancer screening program.  Last CT completed December/2020 was read as a lung RADS 2.  This was a follow-up CT from the September/2020 CT that was read as a lung RADS 4A with multiple new nodules.  Most of those have completely resolved.  Patient was having productive cough with discolored sputum.  He was treated with antibiotics.  He has a history of H. influenzae growth in September/2020 sputum culture.  He is maintained on Trelegy Ellipta.  Last pulmonary function testing was in 2015.  He is a  former patient of Dr. Kendrick Fries needs to be established with a new pulmonologist in our office.  Overall, patient reports his symptoms have improved significantly.  He has a baseline cough with clear sputum.  This is improved from the yellow-green sputum that he was having prior to antibiotic treatment.  He continues to be maintained on Trelegy Ellipta.  Observations/Objective:  03/31/2018-CT chest low-dose screening follow-up-lung RADS 2 benign appearance or behavior continue annual screening with low-dose chest CT without contrast in 12 months, prior patchy tree-in-bud nodular opacities have resolved  02/14/2018-low-dose CT scan-inconclusive due to new consolidation in the right lung base 02/11/17-chest x-ray- hyperinflation with emphysematous changes, coarse interstitial opacities likely chronic 02/08/2017-low-dose CT scan- lung RADS 2 as, benign appearance and behavior, repeat low-dose CT in 12 months, mild diffuse bronchial wall thickening with mild centrilobular and paraseptal emphysema  06/03/2019-CT chest lung cancer screening-lung RADS 4A, suspicious, follow-up CT without contrast in 3 months, mild centrilobular and paraseptal emphysema, upper lobe predominant, dominant left upper lobe and lingular nodules measuring 8.5 x 9.8 mm, new, 4.3 mm subpleural nodule medial right lower lobe new, prior dominant 5.4 perifissural nodule on the right middle lobe unchanged, clustered tree-in-bud nodularity in the left upper lobe/lingula medial right lower lobe which are new favoring infection   09/07/2019-CT chest lung cancer screening nodule follow-up-centrilobular and paraseptal emphysema evident, previously identified new dominant pulmonary nodules in the left upper lobe have nearly resolved in the interval, the inferior lingular nodule now measures only 3.2 mm, the anterior left upper lobe nodule has resolved although there is a cluster of tiny tree-in-bud nodules  in this location today, as such nodular  opacities on the previous study most likely infectious/inflammatory in the an atypical infection would be considered, lung RADS 2, emphysema  Social History   Tobacco Use  Smoking Status Former Smoker  . Packs/day: 1.50  . Years: 45.00  . Pack years: 67.50  . Types: Cigarettes  . Quit date: 11/19/2015  . Years since quitting: 3.8  Smokeless Tobacco Never Used  Tobacco Comment   pt quit smoking!! 11/19/15   Immunization History  Administered Date(s) Administered  . Influenza, High Dose Seasonal PF 07/22/2018, 08/20/2019  . Influenza,inj,Quad PF,6+ Mos 06/20/2015, 11/02/2016, 07/25/2017  . Pneumococcal Conjugate-13 10/06/2013    Assessment and Plan:  COPD GOLD II B Plan: Continue Trelegy Ellipta If patient starts to develop acute or worsened symptoms or sputum color changes he knows to contact her office so we can obtain culture Will order pulmonary function testing to restage patient's COPD We will have patient follow-up with our office in 6 to 8 weeks to establish care with Dr. Carlis Abbott   Chronic respiratory failure with hypoxia [facility]) Plan: Continue to check oxygen levels at home Ensure her oxygen levels are above 90%  Allergic rhinitis Plan: Continue Claritin  Former smoker December/2020 lung RADS 2  Plan: Repeat lung cancer screening CT in December/2021   Follow Up Instructions:  Return in about 2 months (around 11/23/2019), or if symptoms worsen or fail to improve, for Follow up for PFT, Follow up with Dr. Carlis Abbott - 17min .    I discussed the assessment and treatment plan with the patient. The patient was provided an opportunity to ask questions and all were answered. The patient agreed with the plan and demonstrated an understanding of the instructions.   The patient was advised to call back or seek an in-person evaluation if the symptoms worsen or if the condition fails to improve as anticipated.  I provided 26 minutes of non-face-to-face time during this  encounter.   Lauraine Rinne, NP

## 2019-09-22 ENCOUNTER — Telehealth (INDEPENDENT_AMBULATORY_CARE_PROVIDER_SITE_OTHER): Payer: Medicare Other | Admitting: Pulmonary Disease

## 2019-09-22 ENCOUNTER — Encounter: Payer: Self-pay | Admitting: Pulmonary Disease

## 2019-09-22 DIAGNOSIS — J309 Allergic rhinitis, unspecified: Secondary | ICD-10-CM

## 2019-09-22 DIAGNOSIS — J9611 Chronic respiratory failure with hypoxia: Secondary | ICD-10-CM | POA: Diagnosis not present

## 2019-09-22 DIAGNOSIS — Z87891 Personal history of nicotine dependence: Secondary | ICD-10-CM | POA: Diagnosis not present

## 2019-09-22 DIAGNOSIS — J449 Chronic obstructive pulmonary disease, unspecified: Secondary | ICD-10-CM | POA: Diagnosis not present

## 2019-09-22 NOTE — Assessment & Plan Note (Signed)
Plan: Continue Trelegy Ellipta If patient starts to develop acute or worsened symptoms or sputum color changes he knows to contact her office so we can obtain culture Will order pulmonary function testing to restage patient's COPD We will have patient follow-up with our office in 6 to 8 weeks to establish care with Dr. Carlis Abbott

## 2019-09-22 NOTE — Assessment & Plan Note (Signed)
Plan: Continue Claritin 

## 2019-09-22 NOTE — Assessment & Plan Note (Signed)
Plan: Continue to check oxygen levels at home Ensure her oxygen levels are above 90%

## 2019-09-22 NOTE — Assessment & Plan Note (Signed)
December/2020 lung RADS 2  Plan: Repeat lung cancer screening CT in December/2021

## 2019-09-22 NOTE — Patient Instructions (Addendum)
You were seen today by Lauraine Rinne, NP  For:   1. COPD GOLD II B  - Pulmonary function test; Future  Trelegy Ellipta  >>> 1 puff daily in the morning >>>rinse mouth out after use  >>> This inhaler contains 3 medications that help manage her respiratory status, contact our office if you cannot afford this medication or unable to remain on this medication  Only use your albuterol as a rescue medication to be used if you can't catch your breath by resting or doing a relaxed purse lip breathing pattern.  - The less you use it, the better it will work when you need it. - Ok to use up to 2 puffs  every 4 hours if you must but call for immediate appointment if use goes up over your usual need - Don't leave home without it !!  (think of it like the spare tire for your car)   Note your daily symptoms > remember "red flags" for COPD:   >>>Increase in cough >>>increase in sputum production >>>increase in shortness of breath or activity  intolerance.   If you notice these symptoms, please call the office to be seen.   2. Chronic respiratory failure with hypoxia (HCC)  Continue oxygen therapy as prescribed  >>>maintain oxygen saturations greater than 88 percent  >>>if unable to maintain oxygen saturations please contact the office  >>>do not smoke with oxygen  >>>can use nasal saline gel or nasal saline rinses to moisturize nose if oxygen causes dryness  3. Allergic rhinitis, unspecified seasonality, unspecified trigger  Continue Claritin daily  4. Former smoker  Continue follow-up with the lung cancer screening program with repeat lung cancer screening CT in December/2021   We recommend today:  Orders Placed This Encounter  Procedures  . Pulmonary function test    Standing Status:   Future    Standing Expiration Date:   09/21/2020    Scheduling Instructions:     Schedule asap with follow up with Dr. Carlis Abbott in 37min ov    Order Specific Question:   Where should this test be  performed?    Answer:   [facility]    Order Specific Question:   Full PFT: includes the following: basic spirometry, spirometry pre & post bronchodilator, diffusion capacity (DLCO), lung volumes    Answer:   Full PFT   Orders Placed This Encounter  Procedures  . Pulmonary function test   No orders of the defined types were placed in this encounter.   Follow Up:    Return in about 2 months (around 11/23/2019), or if symptoms worsen or fail to improve, for Follow up for PFT, Follow up with Dr. Carlis Abbott - 80min .   Please do your part to reduce the spread of COVID-19:      Reduce your risk of any infection  and COVID19 by using the similar precautions used for avoiding the common cold or flu:  [name] Wash your hands often with soap and warm water for at least 20 seconds.  If soap and water are not readily available, use an alcohol-based hand sanitizer with at least 60% alcohol.  . If coughing or sneezing, cover your mouth and nose by coughing or sneezing into the elbow areas of your shirt or coat, into a tissue or into your sleeve (not your hands). Langley Gauss A MASK when in public  . Avoid shaking hands with others and consider head nods or verbal greetings only. . Avoid touching your eyes, nose,  or mouth with unwashed hands.  . Avoid close contact with people who are sick. . Avoid places or events with large numbers of people in one location, like concerts or sporting events. . If you have some symptoms but not all symptoms, continue to monitor at home and seek medical attention if your symptoms worsen. . If you are having a medical emergency, call 911.   ADDITIONAL HEALTHCARE OPTIONS FOR PATIENTS  [facility] / e-Visit: [email or web address]         [facility]: [phone]  [facility]: [phone]                   [facility]: [phone]     It is flu season:   >>> Best ways to  protect herself from the flu: Receive the yearly flu vaccine, practice good hand hygiene washing with soap and also using hand sanitizer when available, eat a nutritious meals, get adequate rest, hydrate appropriately   Please contact the office if your symptoms worsen or you have concerns that you are not improving.   Thank you for choosing [facility] for your healthcare, and for allowing [place] to partner with you on your healthcare journey. I am thankful to be able to provide care to you today.   Elisha Headland FNP-C

## 2019-09-28 DIAGNOSIS — M1711 Unilateral primary osteoarthritis, right knee: Secondary | ICD-10-CM | POA: Diagnosis not present

## 2019-09-30 ENCOUNTER — Telehealth: Payer: Medicare Other | Admitting: Cardiology

## 2019-10-01 DIAGNOSIS — Z7901 Long term (current) use of anticoagulants: Secondary | ICD-10-CM | POA: Diagnosis not present

## 2019-10-01 DIAGNOSIS — E1169 Type 2 diabetes mellitus with other specified complication: Secondary | ICD-10-CM | POA: Diagnosis not present

## 2019-10-01 DIAGNOSIS — E538 Deficiency of other specified B group vitamins: Secondary | ICD-10-CM | POA: Diagnosis not present

## 2019-10-01 DIAGNOSIS — I4891 Unspecified atrial fibrillation: Secondary | ICD-10-CM | POA: Diagnosis not present

## 2019-10-05 DIAGNOSIS — J449 Chronic obstructive pulmonary disease, unspecified: Secondary | ICD-10-CM | POA: Diagnosis not present

## 2019-10-20 ENCOUNTER — Telehealth: Payer: Self-pay | Admitting: Cardiology

## 2019-10-20 NOTE — Telephone Encounter (Signed)
We are recommending the COVID-19 vaccine to all of our patients. Cardiac medications (including blood thinners) should not deter anyone from being vaccinated and there is no need to hold any of those medications prior to vaccine administration.     Currently, there is a hotline to call (active 10/09/19) to schedule vaccination appointments as no walk-ins will be accepted.   Number: [phone].    If an appointment is not available please go to [email or web address] to sign up for notification when additional vaccine appointments are available.   If you have further questions or concerns about the vaccine process, please visit [email or web address] or contact your primary care physician.  Patient verbalized understanding. 

## 2019-11-05 ENCOUNTER — Ambulatory Visit: Payer: Medicare Other | Admitting: Cardiology

## 2019-11-05 DIAGNOSIS — J449 Chronic obstructive pulmonary disease, unspecified: Secondary | ICD-10-CM | POA: Diagnosis not present

## 2019-11-14 ENCOUNTER — Other Ambulatory Visit (HOSPITAL_COMMUNITY)
Admission: RE | Admit: 2019-11-14 | Discharge: 2019-11-14 | Disposition: A | Discharge status: Home / Self Care | Payer: Medicare Other | Source: Ambulatory Visit | Attending: Pulmonary Disease | Admitting: Pulmonary Disease

## 2019-11-14 ENCOUNTER — Other Ambulatory Visit: Payer: Self-pay

## 2019-11-14 DIAGNOSIS — Z01812 Encounter for preprocedural laboratory examination: Secondary | ICD-10-CM | POA: Insufficient documentation

## 2019-11-14 DIAGNOSIS — Z20822 Contact with and (suspected) exposure to covid-19: Secondary | ICD-10-CM | POA: Insufficient documentation

## 2019-11-14 LAB — SARS CORONAVIRUS 2 (TAT 6-24 HRS): SARS Coronavirus 2: NEGATIVE

## 2019-11-18 ENCOUNTER — Ambulatory Visit: Payer: Medicare Other | Admitting: Critical Care Medicine

## 2019-11-18 ENCOUNTER — Encounter: Payer: Self-pay | Admitting: Critical Care Medicine

## 2019-11-18 ENCOUNTER — Other Ambulatory Visit: Payer: Self-pay

## 2019-11-18 ENCOUNTER — Ambulatory Visit (INDEPENDENT_AMBULATORY_CARE_PROVIDER_SITE_OTHER): Payer: Medicare Other | Admitting: Critical Care Medicine

## 2019-11-18 VITALS — BP 136/70 | HR 75 | Temp 97.2°F | Ht 68.0 in | Wt 220.0 lb

## 2019-11-18 DIAGNOSIS — J309 Allergic rhinitis, unspecified: Secondary | ICD-10-CM | POA: Diagnosis not present

## 2019-11-18 DIAGNOSIS — J449 Chronic obstructive pulmonary disease, unspecified: Secondary | ICD-10-CM | POA: Diagnosis not present

## 2019-11-18 DIAGNOSIS — J9611 Chronic respiratory failure with hypoxia: Secondary | ICD-10-CM | POA: Diagnosis not present

## 2019-11-18 LAB — PULMONARY FUNCTION TEST
DL/VA % pred: 99 %
DL/VA: 4.13 ml/min/mmHg/L
DLCO unc % pred: 79 %
DLCO unc: 19.63 ml/min/mmHg
FEF 25-75 Post: 0.55 L/sec
FEF 25-75 Pre: 0.73 L/sec
FEF2575-%Change-Post: -24 %
FEF2575-%Pred-Post: 22 %
FEF2575-%Pred-Pre: 30 %
FEV1-%Change-Post: -6 %
FEV1-%Pred-Post: 49 %
FEV1-%Pred-Pre: 52 %
FEV1-Post: 1.54 L
FEV1-Pre: 1.64 L
FEV1FVC-%Change-Post: 2 %
FEV1FVC-%Pred-Pre: 70 %
FEV6-%Change-Post: -7 %
FEV6-%Pred-Post: 69 %
FEV6-%Pred-Pre: 75 %
FEV6-Post: 2.76 L
FEV6-Pre: 2.99 L
FEV6FVC-%Change-Post: 1 %
FEV6FVC-%Pred-Post: 102 %
FEV6FVC-%Pred-Pre: 101 %
FVC-%Change-Post: -8 %
FVC-%Pred-Post: 67 %
FVC-%Pred-Pre: 74 %
FVC-Post: 2.85 L
FVC-Pre: 3.12 L
Post FEV1/FVC ratio: 54 %
Post FEV6/FVC ratio: 97 %
Pre FEV1/FVC ratio: 53 %
Pre FEV6/FVC Ratio: 96 %
RV % pred: 187 %
RV: 4.27 L
TLC % pred: 111 %
TLC: 7.38 L

## 2019-11-18 MED ORDER — TRELEGY ELLIPTA 100-62.5-25 MCG/INH IN AEPB: 1.0000 | INHALATION_SPRAY | Freq: Every day | RESPIRATORY_TRACT | 11 refills | Status: DC

## 2019-11-18 MED ORDER — FLUTTER DEVI: 1.0000 | Freq: Two times a day (BID) | 0 refills | Status: AC

## 2019-11-18 MED ORDER — IPRATROPIUM-ALBUTEROL 0.5-2.5 (3) MG/3ML IN SOLN: 3.0000 mL | Freq: Three times a day (TID) | RESPIRATORY_TRACT | 11 refills | Status: DC

## 2019-11-18 MED ORDER — ALBUTEROL SULFATE HFA 108 (90 BASE) MCG/ACT IN AERS: 2.0000 | INHALATION_SPRAY | Freq: Four times a day (QID) | RESPIRATORY_TRACT | 11 refills | Status: DC | PRN

## 2019-11-18 NOTE — Patient Instructions (Addendum)
Thank you for visiting Dr. Chestine Spore at [facility]. We recommend the following:   Meds ordered this encounter  Medications  . Respiratory Therapy Supplies (FLUTTER) DEVI    Sig: 1 Device by Does not apply route in the morning and at bedtime.    Dispense:  1 each    Refill:  0  . Fluticasone-Umeclidin-Vilant (TRELEGY ELLIPTA) 100-62.5-25 MCG/INH AEPB    Sig: Inhale 1 puff into the lungs daily.    Dispense:  1 each    Refill:  11  . ipratropium-albuterol (DUONEB) 0.5-2.5 (3) MG/3ML SOLN    Sig: Take 3 mLs by nebulization 3 (three) times daily.    Dispense:  360 mL    Refill:  11  . albuterol (VENTOLIN HFA) 108 (90 Base) MCG/ACT inhaler    Sig: Inhale 2 puffs into the lungs every 6 (six) hours as needed for wheezing or shortness of breath.    Dispense:  18 g    Refill:  11     Start using your flutter valve twice daily after your nebulizers- blow into it 10 times.  Switching albuterol to ipratropium & albuterol combination nebulizer. You can use up your supply of albuterol nebulizers.    Return in about 6 months (around 05/17/2020).    Please do your part to reduce the spread of COVID-19.

## 2019-11-18 NOTE — Progress Notes (Signed)
PFT done today. 

## 2019-11-18 NOTE — Progress Notes (Signed)
Synopsis: Referred in 2014 for COPD by London Pepper, MD.  Formerly a patient of Dr. Lake Bells.  Subjective:   PATIENT ID: Nicholas Camacho GENDER: male DOB: 03-13-1951, MRN: [id number]  Chief Complaint  Patient presents with  . Follow-up    Patient is here for PFT and ROV. Patient has shortness of breath with exertion (mowing, stairs)    Nicholas Camacho is a 69 year old gentleman who presents for follow-up for COPD and chronic hypoxic respiratory failure.  He is on maintenance Trelegy, uses albuterol nebs twice daily, and uses albuterol rescue inhaler multiple times per day, mostly for wheezing.  His symptoms are at baseline.  He uses supplemental oxygen only at night, not during the day.  He has a home pulse ox where he has measured saturations as low as 86%.  His dyspnea on exertion is stable, mostly with stairs or yard work.  He can walk around fairly easily, but at the grocery store he will lean on a cart by the end of the shopping trip.  He has a baseline cough with clear or green sputum, he frequently has a wet sounding cough, but does not wheeze expectorated.  This mostly happens after using nebulizers.  He has never required hospitalization or emergency department visits for COPD and very infrequently needs antibiotics as an outpatient.  He previously has had significant lower extremity swelling with prednisone and prefers not to take it.  He no longer smokes since quitting in 2017 after 67.5 pack- years.  He follows with [facility] for his lung nodules.    Past Medical History:  Diagnosis Date  . Disturbance of skin sensation 06/24/2013  . Elevated cholesterol   . History of echocardiogram    Echo 4/17: EF 60-65%, no RWMA, Gr 2 DD  . Sleep disturbance, unspecified 06/24/2013     Family History  Problem Relation Age of Onset  . Diabetes Mother   . Prostate cancer Father   . Kidney failure Father   . Alcoholism Sister        history of  . Drug abuse Sister        history of      Past Surgical History:  Procedure Laterality Date  . ESOPHAGOGASTRODUODENOSCOPY N/A 01/08/2016   Procedure: ESOPHAGOGASTRODUODENOSCOPY (EGD);  Surgeon: Laurence Spates, MD;  Location: [facility] ENDOSCOPY;  Service: Endoscopy;  Laterality: N/A;  . fractured pelvis    . INGUINAL HERNIA REPAIR Bilateral   . LEFT HEART CATH AND CORONARY ANGIOGRAPHY N/A 08/30/2017   Procedure: LEFT HEART CATH AND CORONARY ANGIOGRAPHY;  Surgeon: Burnell Blanks, MD;  Location: [facility];  Service: Cardiovascular;  Laterality: N/A;  . reconstruct left heel  2002  . TONSILLECTOMY      Social History   Socioeconomic History  . Marital status: Married    Spouse name: Not on file  . Number of children: 2  . Years of education: COLLEGE  . Highest education level: Not on file  Occupational History  . Occupation: pest control  Tobacco Use  . Smoking status: Former Smoker    Packs/day: 1.50    Years: 45.00    Pack years: 67.50    Types: Cigarettes    Quit date: 11/19/2015    Years since quitting: 4.0  . Smokeless tobacco: Never Used  . Tobacco comment: pt quit smoking!! 11/19/15  Substance and Sexual Activity  . Alcohol use: No    Alcohol/week: 0.0 standard drinks  . Drug use: No  . Sexual activity: Not  on file  Other Topics Concern  . Not on file  Social History Narrative  . Not on file   Social Determinants of Health   Financial Resource Strain:   . Difficulty of Paying Living Expenses: Not on file  Food Insecurity:   . Worried About Programme researcher, broadcasting/film/video in the Last Year: Not on file  . Ran Out of Food in the Last Year: Not on file  Transportation Needs:   . Lack of Transportation (Medical): Not on file  . Lack of Transportation (Non-Medical): Not on file  Physical Activity:   . Days of Exercise per Week: Not on file  . Minutes of Exercise per Session: Not on file  Stress:   . Feeling of Stress : Not on file  Social Connections:   . Frequency of Communication with Friends and  Family: Not on file  . Frequency of Social Gatherings with Friends and Family: Not on file  . Attends Religious Services: Not on file  . Active Member of Clubs or Organizations: Not on file  . Attends Banker Meetings: Not on file  . Marital Status: Not on file  Intimate Partner Violence:   . Fear of Current or Ex-Partner: Not on file  . Emotionally Abused: Not on file  . Physically Abused: Not on file  . Sexually Abused: Not on file     Allergies  Allergen Reactions  . Morphine And Related     HALLUCINATIONS     Immunization History  Administered Date(s) Administered  . Influenza, High Dose Seasonal PF 07/22/2018, 08/20/2019  . Influenza,inj,Quad PF,6+ Mos 06/20/2015, 11/02/2016, 07/25/2017  . Influenza-Unspecified 08/24/2019  . PFIZER SARS-COV-2 Vaccination 10/20/2019, 11/10/2019  . Pneumococcal Conjugate-13 10/06/2013    Outpatient Medications Prior to Visit  Medication Sig Dispense Refill  . atorvastatin (LIPITOR) 80 MG tablet TAKE 1 TABLET BY MOUTH EVERY DAY 90 tablet 2  . ELIQUIS 5 MG TABS tablet TAKE 1 TABLET BY MOUTH TWICE DAILY( OVERDUE FOR FOLLOW UP, NEEDS TO SCHEDULE FOR FURTHER REFILLS) 60 tablet 5  . fluticasone (FLONASE) 50 MCG/ACT nasal spray Place 2 sprays into both nostrils 2 (two) times daily. 16 g 3  . Fluticasone-Umeclidin-Vilant (TRELEGY ELLIPTA) 100-62.5-25 MCG/INH AEPB Take 1 puff by mouth daily. 60 each 5  . losartan (COZAAR) 50 MG tablet TAKE 1 TABLET(50 MG) BY MOUTH DAILY 90 tablet 3  . nitroGLYCERIN (NITROSTAT) 0.4 MG SL tablet Place 1 tablet (0.4 mg total) under the tongue every 5 (five) minutes as needed for chest pain. 25 tablet 2  . VENTOLIN HFA 108 (90 Base) MCG/ACT inhaler INHALE 2 PUFFS BY MOUTH EVERY 6 HOURS AS NEEDED FOR WHEEZING OR SHORTNESS OF BREATH 18 g 1  . albuterol (PROVENTIL) (2.5 MG/3ML) 0.083% nebulizer solution INHALE 1 VIAL VIA NEBULIZER EVERY 6 HOURS AS NEEDED FOR WHEEZING OR SHORTNESS OF BREATH 75 mL 2  . cetirizine  (ZYRTEC) 10 MG tablet Take 10 mg by mouth daily.    [name] amoxicillin-clavulanate (AUGMENTIN) 875-125 MG tablet Take 1 tablet by mouth every 12 (twelve) hours. 14 tablet 0  . ipratropium-albuterol (DUONEB) 0.5-2.5 (3) MG/3ML nebulizer solution 3 mL      No facility-administered medications prior to visit.    Review of Systems  Constitutional: Negative.   Respiratory: Positive for cough, sputum production, shortness of breath and wheezing.      Objective:   Vitals:   11/18/19 1600  BP: 136/70  Pulse: 75  Temp: (!) 97.2 F (36.2 C)  TempSrc: Temporal  SpO2: 91%  Weight: 220 lb (99.8 kg)  Height: 5\' 8"  (1.727 m)   91% on   RA BMI Readings from Last 3 Encounters:  11/18/19 33.45 kg/m  06/19/19 31.14 kg/m  07/22/18 32.03 kg/m   Wt Readings from Last 3 Encounters:  11/18/19 220 lb (99.8 kg)  06/19/19 217 lb (98.4 kg)  07/22/18 223 lb 3.2 oz (101.2 kg)    Physical Exam Vitals reviewed.  Constitutional:      Appearance: Normal appearance. He is not ill-appearing.  HENT:     Head: Normocephalic and atraumatic.  Cardiovascular:     Rate and Rhythm: Normal rate and regular rhythm.     Heart sounds: No murmur.  Pulmonary:     Comments: Occasional wet sounding cough.  Expiratory rhonchi throughout, no wheezing.  No conversational dyspnea. Abdominal:     General: There is no distension.     Palpations: Abdomen is soft.  Musculoskeletal:        General: No swelling or deformity.     Cervical back: Neck supple.  Skin:    General: Skin is warm and dry.     Findings: No rash.  Neurological:     General: No focal deficit present.     Mental Status: He is alert.     Motor: No weakness.     Coordination: Coordination normal.  Psychiatric:        Mood and Affect: Mood normal.        Behavior: Behavior normal.      CBC    Component Value Date/Time   WBC 9.6 08/27/2017 0918   WBC 14.2 Repeated and verified X2. (H) 02/11/2017 1508   RBC 5.28 08/27/2017 0918   RBC  5.08 02/11/2017 1508   HGB 15.4 08/27/2017 0918   HCT 46.9 08/27/2017 0918   PLT 338 08/27/2017 0918   MCV 89 08/27/2017 0918   MCH 29.2 08/27/2017 0918   MCH 28.4 03/30/2016 0951   MCHC 32.8 08/27/2017 0918   MCHC 32.6 02/11/2017 1508   RDW 14.3 08/27/2017 0918   LYMPHSABS 1.5 08/27/2017 0918   MONOABS 1.4 (H) 02/11/2017 1508   EOSABS 0.3 08/27/2017 0918   BASOSABS 0.0 08/27/2017 0918    CHEMISTRY No results for input(s): NA, K, CL, CO2, GLUCOSE, BUN, CREATININE, CALCIUM, MG, PHOS in the last 168 hours. CrCl cannot be calculated (Patient's most recent lab result is older than the maximum 21 days allowed.).   Chest Imaging- films reviewed: CT chest low dose 09/07/2019- apical scarring, emphysema,  airway thickening, mucus impactions, linear scars in LL. Fissural RML nodule. Lung RADS 2  Pulmonary Functions Testing Results: PFT Results Latest Ref Rng & Units 11/18/2019 10/23/2013  FVC-Pre L 3.12 4.11  FVC-Predicted Pre % 74 93  FVC-Post L 2.85 4.12  FVC-Predicted Post % 67 94  Pre FEV1/FVC % % 53 58  Post FEV1/FCV % % 54 58  FEV1-Pre L 1.64 2.38  FEV1-Predicted Pre % 52 72  FEV1-Post L 1.54 2.41  DLCO UNC% % 79 72  DLCO COR %Predicted % 99 72  TLC L 7.38 8.64  TLC % Predicted % 111 130  RV % Predicted % 187 206    2021: Moderate obstruction without bronchodilator reversibility. Significant air trapping without hyperinflation.  Mildly reduced diffusion.  2015: Moderate obstruction with hyperinflation, no significant bronchodilator reversibility.  Mild diffusion impairment.      Assessment & Plan:     ICD-10-CM   1. COPD GOLD II B  J44.9  2. Chronic respiratory failure with hypoxia (HCC)  J96.11   3. Allergic rhinitis, unspecified seasonality, unspecified trigger  J30.9      COPD, GOLD B. No significant exacerbations in the past. -con't Trelegy daily- refills given today. -duonebs BID with flutter valve -albuterol inhaler PRN- refilled today -UTD flu &  pneumonia- 13 vaccine, needs pneumococcal 23-valent vaccine -received first covid vaccine -walk in the office to evaluate for exertional hypoxia -con't regular physical activity  Chronic hypoxic respiratory failure -con't nocturnal supplemental O2 -walked in the office- no desaturations  Pulmonary nodules, history of tobacco abuse -12 month follow up CT Dec 2020   Current Outpatient Medications:  .  atorvastatin (LIPITOR) 80 MG tablet, TAKE 1 TABLET BY MOUTH EVERY DAY, Disp: 90 tablet, Rfl: 2 .  ELIQUIS 5 MG TABS tablet, TAKE 1 TABLET BY MOUTH TWICE DAILY( OVERDUE FOR FOLLOW UP, NEEDS TO SCHEDULE FOR FURTHER REFILLS), Disp: 60 tablet, Rfl: 5 .  fluticasone (FLONASE) 50 MCG/ACT nasal spray, Place 2 sprays into both nostrils 2 (two) times daily., Disp: 16 g, Rfl: 3 .  Fluticasone-Umeclidin-Vilant (TRELEGY ELLIPTA) 100-62.5-25 MCG/INH AEPB, Take 1 puff by mouth daily., Disp: 60 each, Rfl: 5 .  losartan (COZAAR) 50 MG tablet, TAKE 1 TABLET(50 MG) BY MOUTH DAILY, Disp: 90 tablet, Rfl: 3 .  nitroGLYCERIN (NITROSTAT) 0.4 MG SL tablet, Place 1 tablet (0.4 mg total) under the tongue every 5 (five) minutes as needed for chest pain., Disp: 25 tablet, Rfl: 2 .  VENTOLIN HFA 108 (90 Base) MCG/ACT inhaler, INHALE 2 PUFFS BY MOUTH EVERY 6 HOURS AS NEEDED FOR WHEEZING OR SHORTNESS OF BREATH, Disp: 18 g, Rfl: 1 .  albuterol (VENTOLIN HFA) 108 (90 Base) MCG/ACT inhaler, Inhale 2 puffs into the lungs every 6 (six) hours as needed for wheezing or shortness of breath., Disp: 18 g, Rfl: 11 .  cetirizine (ZYRTEC) 10 MG tablet, Take 10 mg by mouth daily., Disp: , Rfl:  .  Fluticasone-Umeclidin-Vilant (TRELEGY ELLIPTA) 100-62.5-25 MCG/INH AEPB, Inhale 1 puff into the lungs daily., Disp: 1 each, Rfl: 11 .  ipratropium-albuterol (DUONEB) 0.5-2.5 (3) MG/3ML SOLN, Take 3 mLs by nebulization 3 (three) times daily., Disp: 360 mL, Rfl: 11 .  Respiratory Therapy Supplies (FLUTTER) DEVI, 1 Device by Does not apply route in  the morning and at bedtime., Disp: 1 each, Rfl: 0    Steffanie Dunn, DO [facility] 11/18/2019 4:31 PM

## 2019-11-23 ENCOUNTER — Encounter: Payer: Self-pay | Admitting: Cardiology

## 2019-11-23 ENCOUNTER — Other Ambulatory Visit: Payer: Self-pay

## 2019-11-23 ENCOUNTER — Ambulatory Visit: Payer: Medicare Other | Admitting: Cardiology

## 2019-11-23 VITALS — BP 138/70 | HR 85 | Ht 70.0 in | Wt 222.4 lb

## 2019-11-23 DIAGNOSIS — I4891 Unspecified atrial fibrillation: Secondary | ICD-10-CM

## 2019-11-23 DIAGNOSIS — I2510 Atherosclerotic heart disease of native coronary artery without angina pectoris: Secondary | ICD-10-CM | POA: Diagnosis not present

## 2019-11-23 DIAGNOSIS — E785 Hyperlipidemia, unspecified: Secondary | ICD-10-CM | POA: Diagnosis not present

## 2019-11-23 DIAGNOSIS — I5031 Acute diastolic (congestive) heart failure: Secondary | ICD-10-CM

## 2019-11-23 DIAGNOSIS — I10 Essential (primary) hypertension: Secondary | ICD-10-CM | POA: Diagnosis not present

## 2019-11-23 DIAGNOSIS — R0609 Other forms of dyspnea: Secondary | ICD-10-CM

## 2019-11-23 DIAGNOSIS — R0600 Dyspnea, unspecified: Secondary | ICD-10-CM

## 2019-11-23 MED ORDER — DILTIAZEM HCL ER COATED BEADS 120 MG PO CP24: 120.0000 mg | ORAL_CAPSULE | Freq: Every day | ORAL | 3 refills | Status: DC

## 2019-11-23 NOTE — Patient Instructions (Addendum)
Medication Instructions:  Your physician has recommended you make the following change in your medication:  1-START Cardizem 120 mg daily at bedtime.   *If you need a refill on your cardiac medications before your next appointment, please call your pharmacy*  Lab Work: Your physician recommends that you have lab work today- CBC, CMP, Lipids, TSH, BNP  If you have labs (blood work) drawn today and your tests are completely normal, you will receive your results only by: [name] MyChart Message (if you have MyChart) OR . A paper copy in the mail If you have any lab test that is abnormal or we need to change your treatment, we will call you to review the results.  Testing/Procedures: Your physician has requested that you have an echocardiogram before 12/03/19 if possible. Echocardiography is a painless test that uses sound waves to create images of your heart. It provides your doctor with information about the size and shape of your heart and how well your heart's chambers and valves are working. This procedure takes approximately one hour. There are no restrictions for this procedure.  Follow-Up: At [facility], you and your health needs are our priority.  As part of our continuing mission to provide you with exceptional heart care, we have created designated Provider Care Teams.  These Care Teams include your primary Cardiologist (physician) and Advanced Practice Providers (APPs -  Physician Assistants and Nurse Practitioners) who all work together to provide you with the care you need, when you need it.  Your next appointment:   12/03/19 at 11:00 am  The format for your next appointment:   In Person  Provider:   You may see Tobias Alexander, MD or one of the following Advanced Practice Providers on your designated Care Team:    Ronie Spies, PA-C  Jacolyn Reedy, PA-C

## 2019-11-23 NOTE — Progress Notes (Signed)
Cardiology Office Note:    Date:  11/23/2019   ID:  Nicholas Camacho, DOB 06/28/1951, MRN [id number]  PCP:  Farris Has, MD  Cardiologist:  Tobias Alexander, MD  Electrophysiologist:  None   Referring MD: Farris Has, MD   Chief complaint: Shortness of breath, palpitations  History of Present Illness:    Nicholas Camacho is a 69 y.o. male with a hx of PAF chads BASC equals 3 on Eliquis (had GI bleed on Xarelto), hypertension, HLD, COPD, OSA, small AAA and iliac aneurysm.  Coronary CTA 08/20/17 for mild fatigue on exertion showed calcium score of 3244 which was the 99th percentile for age and sex.  Moderate stenosis in the LAD and RCA with suspicion for severe stenosis in the mid RCA.  Cardiac catheterization 08/30/17 showed moderate nonobstructive calcific stenosis in the RCA with mild nonobstructive calcific stenosis in the LAD and circumflex normal LV function.  Medical management was recommended as well as smoking cessation.  See below for complete details.  12/23/2017 - the patient was complaining of dyspnea on exertion especially when walking stairs. It was believed to be related to COPD, diltiazem, ASA was stopped, losartan was increased and inhalers were prescribed.   06/19/2019 - 18 months follow up, he is doing well from cardiac standpoint, he has retired a year ago and helps with his grandkids. He has been evaluated for hematochezia, underwent colonoscopy with findings of hemorrhoids only. Dr Kateri Plummer obtained his labs and he has no anemia (we will obtain those). He also underwent chest CTA that showed:  1. Lung-RADS 4A, suspicious. Follow up low-dose chest CT without contrast in 3 months (please use the following order, "CT CHEST LCS NODULE FOLLOW-UP W/O CM") is recommended. 2. Clustered tree-in-bud nodularity in the left upper lobe/lingula and medial right lower lobe, favoring infection. Dominant lingular nodules measure up to 9.8 mm. He has been started on Augmentin for  presumed pneumonia. Denies chest pain, LE edema, he is complaint with his meds.  11/23/2019, the patient is coming after 6 months, he has been experiencing worsening dyspnea on exertion, he says he has to stop any walks flight of stairs, he denies any orthopnea or proximal nocturnal dyspnea.  No lower extremity edema, no chest pain.  He states that he is unable to do activities he was able to do easily about a year ago, he has also experienced 20 pound weight again that he attributed to decreased exercise activity.  Past Medical History:  Diagnosis Date  . Disturbance of skin sensation 06/24/2013  . Elevated cholesterol   . History of echocardiogram    Echo 4/17: EF 60-65%, no RWMA, Gr 2 DD  . Sleep disturbance, unspecified 06/24/2013    Past Surgical History:  Procedure Laterality Date  . ESOPHAGOGASTRODUODENOSCOPY N/A 01/08/2016   Procedure: ESOPHAGOGASTRODUODENOSCOPY (EGD);  Surgeon: Carman Ching, MD;  Location: [facility] ENDOSCOPY;  Service: Endoscopy;  Laterality: N/A;  . fractured pelvis    . INGUINAL HERNIA REPAIR Bilateral   . LEFT HEART CATH AND CORONARY ANGIOGRAPHY N/A 08/30/2017   Procedure: LEFT HEART CATH AND CORONARY ANGIOGRAPHY;  Surgeon: Kathleene Hazel, MD;  Location: [facility] INVASIVE CV LAB;  Service: Cardiovascular;  Laterality: N/A;  . reconstruct left heel  2002  . TONSILLECTOMY     Current Medications: Current Meds  Medication Sig  . albuterol (VENTOLIN HFA) 108 (90 Base) MCG/ACT inhaler Inhale 2 puffs into the lungs every 6 (six) hours as needed for wheezing or shortness of breath.  [name] atorvastatin (LIPITOR)  80 MG tablet TAKE 1 TABLET BY MOUTH EVERY DAY  . ELIQUIS 5 MG TABS tablet TAKE 1 TABLET BY MOUTH TWICE DAILY( OVERDUE FOR FOLLOW UP, NEEDS TO SCHEDULE FOR FURTHER REFILLS)  . fluticasone (FLONASE) 50 MCG/ACT nasal spray Place 2 sprays into both nostrils 2 (two) times daily.  . Fluticasone-Umeclidin-Vilant (TRELEGY ELLIPTA) 100-62.5-25 MCG/INH AEPB Inhale 1 puff into  the lungs daily.  [name] ipratropium-albuterol (DUONEB) 0.5-2.5 (3) MG/3ML SOLN Take 3 mLs by nebulization 3 (three) times daily.  [name] losartan (COZAAR) 50 MG tablet TAKE 1 TABLET(50 MG) BY MOUTH DAILY  . Respiratory Therapy Supplies (FLUTTER) DEVI 1 Device by Does not apply route in the morning and at bedtime.    Allergies:   Morphine and related   Social History   Socioeconomic History  . Marital status: Married    Spouse name: Not on file  . Number of children: 2  . Years of education: COLLEGE  . Highest education level: Not on file  Occupational History  . Occupation: pest control  Tobacco Use  . Smoking status: Former Smoker    Packs/day: 1.50    Years: 45.00    Pack years: 67.50    Types: Cigarettes    Quit date: 11/19/2015    Years since quitting: 4.0  . Smokeless tobacco: Never Used  . Tobacco comment: pt quit smoking!! 11/19/15  Substance and Sexual Activity  . Alcohol use: No    Alcohol/week: 0.0 standard drinks  . Drug use: No  . Sexual activity: Not on file  Other Topics Concern  . Not on file  Social History Narrative  . Not on file   Social Determinants of Health   Financial Resource Strain:   . Difficulty of Paying Living Expenses: Not on file  Food Insecurity:   . Worried About Programme researcher, broadcasting/film/video in the Last Year: Not on file  . Ran Out of Food in the Last Year: Not on file  Transportation Needs:   . Lack of Transportation (Medical): Not on file  . Lack of Transportation (Non-Medical): Not on file  Physical Activity:   . Days of Exercise per Week: Not on file  . Minutes of Exercise per Session: Not on file  Stress:   . Feeling of Stress : Not on file  Social Connections:   . Frequency of Communication with Friends and Family: Not on file  . Frequency of Social Gatherings with Friends and Family: Not on file  . Attends Religious Services: Not on file  . Active Member of Clubs or Organizations: Not on file  . Attends Banker Meetings: Not on  file  . Marital Status: Not on file     Family History: The patient's family history includes Alcoholism in his sister; Diabetes in his mother; Drug abuse in his sister; Kidney failure in his father; Prostate cancer in his father.  ROS:   Please see the history of present illness.    All other systems reviewed and are negative.  EKGs/Labs/Other Studies Reviewed:    The following studies were reviewed today:  EKG:  EKG is ordered today.  The ekg ordered today demonstrates atrial fibrillation with RVR, 127 bpm, previously in sinus rhythm, this was personally reviewed.  Recent Labs: No results found for requested labs within last 8760 hours.  Recent Lipid Panel    Component Value Date/Time   CHOL 84 (L) 10/29/2017 0801   TRIG 42 10/29/2017 0801   HDL 32 (L) 10/29/2017 0801   CHOLHDL  2.6 10/29/2017 0801   CHOLHDL 3 01/11/2015 1248   VLDL 8.6 01/11/2015 1248   LDLCALC 44 10/29/2017 0801   Physical Exam:    VS:  BP 138/70   Pulse 85   Ht 5\' 10"  (1.778 m)   Wt 222 lb 6.4 oz (100.9 kg)   SpO2 94%   BMI 31.91 kg/m     Wt Readings from Last 3 Encounters:  11/23/19 222 lb 6.4 oz (100.9 kg)  11/18/19 220 lb (99.8 kg)  06/19/19 217 lb (98.4 kg)     GEN:  Well nourished, well developed in no acute distress HEENT: Normal NECK: No JVD; No carotid bruits LYMPHATICS: No lymphadenopathy CARDIAC: iRRR, no murmurs, rubs, gallops RESPIRATORY:  Clear to auscultation without rales, wheezing or rhonchi  ABDOMEN: Soft, non-tender, non-distended MUSCULOSKELETAL:  No edema; No deformity  SKIN: Warm and dry NEUROLOGIC:  Alert and oriented x 3 PSYCHIATRIC:  Normal affect   ASSESSMENT:    1. Atrial fibrillation, unspecified type ([place])   2. Coronary artery disease involving native coronary artery of native heart without angina pectoris   3. Hyperlipidemia, unspecified hyperlipidemia type   4. Essential hypertension   5. DOE (dyspnea on exertion)   6. Acute diastolic CHF (congestive  heart failure), NYHA class 2 (HCC)    PLAN:    In order of problems listed above:  1. Paroxysmal atrial fibrillation, RVR-previously in sinus rhythm, we will obtain labs including CBC, CMP, lipids, TSH and BNP, we will restart Cardizem CD 120 mg daily to be taken at night, we will bring patient back on March 4 for follow-up to see if he cardioverted back to sinus rhythm and if not we will arrange for cardioversion given the fact that he is having significant symptoms of dyspnea on exertion.   2. Acute on chronic diastolic CHF, we will repeat his echocardiogram, will obtain BNP and if elevated will start Lasix 40 mg daily however I am not going to do it yet as he does not seems fluid overloaded on physical exam, however he has significant weight gain and he is short of breath. 3. CAD with moderate nonobstructive disease and CAD, mild in LAD and circumflex, he is quit smoking 4 years ago.  We will continue atorvastatin, Eliquis, and losartan. 4. Hyperlipidemia on Lipitor 80 mg daily, will repeat lipids and liver function test today. 5. Hypertension -borderline, we are restarting Cardizem CD 120 mg daily. 6. Lung nodule and pneumonia - follow up scan in December 2020 showed Lung-RADS 2, benign appearance or behavior. Continue annual screening with low-dose chest CT without contrast in 12 months. The dominant nodular opacity seen in the anterior left upper lobe on the previous study now represents a cluster of micro nodules having a configuration suggesting tree-in-bud pattern. Features likely reflect infectious/inflammatory etiology and atypical infection would be a consideration.   Medication Adjustments/Labs and Tests Ordered: Current medicines are reviewed at length with the patient today.  Concerns regarding medicines are outlined above.  Orders Placed This Encounter  Procedures  . Comprehensive metabolic panel  . CBC with Differential/Platelet  . TSH  . Pro b natriuretic peptide (BNP)  .  Lipid panel  . EKG 12-Lead  . ECHOCARDIOGRAM COMPLETE   Meds ordered this encounter  Medications  . diltiazem (CARDIZEM CD) 120 MG 24 hr capsule    Sig: Take 1 capsule (120 mg total) by mouth at bedtime.    Dispense:  90 capsule    Refill:  3    Patient  Instructions  Medication Instructions:  Your physician has recommended you make the following change in your medication:  1-START Cardizem 120 mg daily at bedtime.   *If you need a refill on your cardiac medications before your next appointment, please call your pharmacy*  Lab Work: Your physician recommends that you have lab work today- CBC, CMP, Lipids, TSH, BNP  If you have labs (blood work) drawn today and your tests are completely normal, you will receive your results only by: [name] MyChart Message (if you have MyChart) OR . A paper copy in the mail If you have any lab test that is abnormal or we need to change your treatment, we will call you to review the results.  Testing/Procedures: Your physician has requested that you have an echocardiogram before 12/03/19 if possible. Echocardiography is a painless test that uses sound waves to create images of your heart. It provides your doctor with information about the size and shape of your heart and how well your heart's chambers and valves are working. This procedure takes approximately one hour. There are no restrictions for this procedure.  Follow-Up: At [facility], you and your health needs are our priority.  As part of our continuing mission to provide you with exceptional heart care, we have created designated Provider Care Teams.  These Care Teams include your primary Cardiologist (physician) and Advanced Practice Providers (APPs -  Physician Assistants and Nurse Practitioners) who all work together to provide you with the care you need, when you need it.  Your next appointment:   12/03/19 at 11:00 am  The format for your next appointment:   In Person  Provider:   You may  see Tobias Alexander, MD or one of the following Advanced Practice Providers on your designated Care Team:    Ronie Spies, PA-C  Jacolyn Reedy, PA-C       Signed, Tobias Alexander, MD  11/23/2019 1:26 PM    [facility]

## 2019-11-24 LAB — COMPREHENSIVE METABOLIC PANEL
ALT: 23 IU/L (ref 0–44)
AST: 21 IU/L (ref 0–40)
Albumin/Globulin Ratio: 1.4 (ref 1.2–2.2)
Albumin: 4 g/dL (ref 3.8–4.8)
Alkaline Phosphatase: 149 IU/L — ABNORMAL HIGH (ref 39–117)
BUN/Creatinine Ratio: 12 (ref 10–24)
BUN: 13 mg/dL (ref 8–27)
Bilirubin Total: 0.3 mg/dL (ref 0.0–1.2)
CO2: 21 mmol/L (ref 20–29)
Calcium: 9.3 mg/dL (ref 8.6–10.2)
Chloride: 104 mmol/L (ref 96–106)
Creatinine, Ser: 1.13 mg/dL (ref 0.76–1.27)
GFR calc Af Amer: 77 mL/min/{1.73_m2} (ref >59)
GFR calc non Af Amer: 67 mL/min/{1.73_m2} (ref >59)
Globulin, Total: 2.8 g/dL (ref 1.5–4.5)
Glucose: 132 mg/dL — ABNORMAL HIGH (ref 65–99)
Potassium: 4.3 mmol/L (ref 3.5–5.2)
Sodium: 140 mmol/L (ref 134–144)
Total Protein: 6.8 g/dL (ref 6.0–8.5)

## 2019-11-24 LAB — CBC WITH DIFFERENTIAL/PLATELET
Basophils Absolute: 0 10*3/uL (ref 0.0–0.2)
Basos: 0 %
EOS (ABSOLUTE): 0.3 10*3/uL (ref 0.0–0.4)
Eos: 4 %
Hematocrit: 43.8 % (ref 37.5–51.0)
Hemoglobin: 14.7 g/dL (ref 13.0–17.7)
Immature Grans (Abs): 0.1 10*3/uL (ref 0.0–0.1)
Immature Granulocytes: 1 %
Lymphocytes Absolute: 1.7 10*3/uL (ref 0.7–3.1)
Lymphs: 20 %
MCH: 27.9 pg (ref 26.6–33.0)
MCHC: 33.6 g/dL (ref 31.5–35.7)
MCV: 83 fL (ref 79–97)
Monocytes Absolute: 0.6 10*3/uL (ref 0.1–0.9)
Monocytes: 7 %
Neutrophils Absolute: 5.9 10*3/uL (ref 1.4–7.0)
Neutrophils: 68 %
Platelets: 373 10*3/uL (ref 150–450)
RBC: 5.27 x10E6/uL (ref 4.14–5.80)
RDW: 13.2 % (ref 11.6–15.4)
WBC: 8.6 10*3/uL (ref 3.4–10.8)

## 2019-11-24 LAB — TSH: TSH: 3.95 u[IU]/mL (ref 0.450–4.500)

## 2019-11-24 LAB — PRO B NATRIURETIC PEPTIDE: NT-Pro BNP: 70 pg/mL (ref 0–376)

## 2019-11-24 LAB — LIPID PANEL
Chol/HDL Ratio: 3.2 ratio (ref 0.0–5.0)
Cholesterol, Total: 79 mg/dL — ABNORMAL LOW (ref 100–199)
HDL: 25 mg/dL — ABNORMAL LOW (ref >39)
LDL Chol Calc (NIH): 41 mg/dL (ref 0–99)
Triglycerides: 51 mg/dL (ref 0–149)
VLDL Cholesterol Cal: 13 mg/dL (ref 5–40)

## 2019-11-26 ENCOUNTER — Other Ambulatory Visit: Payer: Self-pay

## 2019-11-26 ENCOUNTER — Ambulatory Visit (HOSPITAL_COMMUNITY): Payer: Medicare Other | Attending: Cardiovascular Disease

## 2019-11-26 DIAGNOSIS — I2510 Atherosclerotic heart disease of native coronary artery without angina pectoris: Secondary | ICD-10-CM | POA: Diagnosis not present

## 2019-11-26 DIAGNOSIS — I4891 Unspecified atrial fibrillation: Secondary | ICD-10-CM | POA: Diagnosis not present

## 2019-12-01 ENCOUNTER — Other Ambulatory Visit: Payer: Self-pay | Admitting: Cardiology

## 2019-12-03 ENCOUNTER — Ambulatory Visit: Payer: Medicare Other | Admitting: Cardiology

## 2019-12-03 ENCOUNTER — Encounter: Payer: Self-pay | Admitting: Cardiology

## 2019-12-03 ENCOUNTER — Other Ambulatory Visit: Payer: Self-pay

## 2019-12-03 VITALS — BP 136/84 | HR 86 | Ht 70.0 in | Wt 221.4 lb

## 2019-12-03 DIAGNOSIS — E785 Hyperlipidemia, unspecified: Secondary | ICD-10-CM | POA: Diagnosis not present

## 2019-12-03 DIAGNOSIS — I480 Paroxysmal atrial fibrillation: Secondary | ICD-10-CM

## 2019-12-03 DIAGNOSIS — I10 Essential (primary) hypertension: Secondary | ICD-10-CM

## 2019-12-03 DIAGNOSIS — J449 Chronic obstructive pulmonary disease, unspecified: Secondary | ICD-10-CM | POA: Diagnosis not present

## 2019-12-03 DIAGNOSIS — I7121 Aneurysm of the ascending aorta, without rupture: Secondary | ICD-10-CM

## 2019-12-03 DIAGNOSIS — I2510 Atherosclerotic heart disease of native coronary artery without angina pectoris: Secondary | ICD-10-CM

## 2019-12-03 DIAGNOSIS — I712 Thoracic aortic aneurysm, without rupture: Secondary | ICD-10-CM

## 2019-12-03 NOTE — Progress Notes (Signed)
Cardiology Office Note:    Date:  12/03/2019   ID:  Nicholas Camacho, DOB 1950-12-18, MRN [id number]  PCP:  Farris Has, MD  Cardiologist:  Tobias Alexander, MD  Electrophysiologist:  None   Referring MD: Farris Has, MD   Chief complaint: Shortness of breath  History of Present Illness:    Nicholas Camacho is a 69 y.o. male with a hx of PAF chads BASC equals 3 on Eliquis (had GI bleed on Xarelto), hypertension, HLD, COPD, OSA, small AAA and iliac aneurysm.  Coronary CTA 08/20/17 for mild fatigue on exertion showed calcium score of 3244 which was the 99th percentile for age and sex.  Moderate stenosis in the LAD and RCA with suspicion for severe stenosis in the mid RCA.  Cardiac catheterization 08/30/17 showed moderate nonobstructive calcific stenosis in the RCA with mild nonobstructive calcific stenosis in the LAD and circumflex normal LV function.  Medical management was recommended as well as smoking cessation.  See below for complete details.  12/23/2017 - the patient was complaining of dyspnea on exertion especially when walking stairs. It was believed to be related to COPD, diltiazem, ASA was stopped, losartan was increased and inhalers were prescribed.   06/19/2019 - 18 months follow up, he is doing well from cardiac standpoint, he has retired a year ago and helps with his grandkids. He has been evaluated for hematochezia, underwent colonoscopy with findings of hemorrhoids only. Dr Kateri Plummer obtained his labs and he has no anemia (we will obtain those). He also underwent chest CTA that showed:  1. Lung-RADS 4A, suspicious. Follow up low-dose chest CT without contrast in 3 months (please use the following order, "CT CHEST LCS NODULE FOLLOW-UP W/O CM") is recommended. 2. Clustered tree-in-bud nodularity in the left upper lobe/lingula and medial right lower lobe, favoring infection. Dominant lingular nodules measure up to 9.8 mm. He has been started on Augmentin for presumed  pneumonia. Denies chest pain, LE edema, he is complaint with his meds.  11/23/2019, the patient is coming after 6 months, he has been experiencing worsening dyspnea on exertion, he says he has to stop any walks flight of stairs, he denies any orthopnea or proximal nocturnal dyspnea.  No lower extremity edema, no chest pain.  He states that he is unable to do activities he was able to do easily about a year ago, he has also experienced 20 pound weight again that he attributed to decreased exercise activity.  12/03/2019 -the patient is coming after 10 days, he feels like he has slightly more energy, he does not feel palpitations in our, he continues to feel short of breath however he is now using DuoNeb twice a day and his shortness of breath has improved.  He has been compliant with his medications has no side effects.  Denies any lower extremity edema or orthopnea.   Past Medical History:  Diagnosis Date  . Disturbance of skin sensation 06/24/2013  . Elevated cholesterol   . History of echocardiogram    Echo 4/17: EF 60-65%, no RWMA, Gr 2 DD  . Sleep disturbance, unspecified 06/24/2013    Past Surgical History:  Procedure Laterality Date  . ESOPHAGOGASTRODUODENOSCOPY N/A 01/08/2016   Procedure: ESOPHAGOGASTRODUODENOSCOPY (EGD);  Surgeon: Carman Ching, MD;  Location: [facility] ENDOSCOPY;  Service: Endoscopy;  Laterality: N/A;  . fractured pelvis    . INGUINAL HERNIA REPAIR Bilateral   . LEFT HEART CATH AND CORONARY ANGIOGRAPHY N/A 08/30/2017   Procedure: LEFT HEART CATH AND CORONARY ANGIOGRAPHY;  Surgeon: Verne Carrow  D, MD;  Location: [facility] INVASIVE CV LAB;  Service: Cardiovascular;  Laterality: N/A;  . reconstruct left heel  2002  . TONSILLECTOMY     Current Medications: Current Meds  Medication Sig  . albuterol (VENTOLIN HFA) 108 (90 Base) MCG/ACT inhaler Inhale 2 puffs into the lungs every 6 (six) hours as needed for wheezing or shortness of breath.  [name] atorvastatin (LIPITOR) 80 MG tablet  TAKE 1 TABLET BY MOUTH EVERY DAY  . diltiazem (CARDIZEM CD) 120 MG 24 hr capsule Take 1 capsule (120 mg total) by mouth at bedtime.  [name] ELIQUIS 5 MG TABS tablet TAKE 1 TABLET BY MOUTH TWICE DAILY( OVERDUE FOR FOLLOW UP, NEEDS TO SCHEDULE FOR FURTHER REFILLS)  . fluticasone (FLONASE) 50 MCG/ACT nasal spray Place 2 sprays into both nostrils 2 (two) times daily.  . Fluticasone-Umeclidin-Vilant (TRELEGY ELLIPTA) 100-62.5-25 MCG/INH AEPB Inhale 1 puff into the lungs daily.  [name] ipratropium-albuterol (DUONEB) 0.5-2.5 (3) MG/3ML SOLN Take 3 mLs by nebulization 3 (three) times daily.  [name] losartan (COZAAR) 50 MG tablet TAKE 1 TABLET(50 MG) BY MOUTH DAILY  . Respiratory Therapy Supplies (FLUTTER) DEVI 1 Device by Does not apply route in the morning and at bedtime.    Allergies:   Morphine and related   Social History   Socioeconomic History  . Marital status: Married    Spouse name: Not on file  . Number of children: 2  . Years of education: COLLEGE  . Highest education level: Not on file  Occupational History  . Occupation: pest control  Tobacco Use  . Smoking status: Former Smoker    Packs/day: 1.50    Years: 45.00    Pack years: 67.50    Types: Cigarettes    Quit date: 11/19/2015    Years since quitting: 4.0  . Smokeless tobacco: Never Used  . Tobacco comment: pt quit smoking!! 11/19/15  Substance and Sexual Activity  . Alcohol use: No    Alcohol/week: 0.0 standard drinks  . Drug use: No  . Sexual activity: Not on file  Other Topics Concern  . Not on file  Social History Narrative  . Not on file   Social Determinants of Health   Financial Resource Strain:   . Difficulty of Paying Living Expenses: Not on file  Food Insecurity:   . Worried About Programme researcher, broadcasting/film/video in the Last Year: Not on file  . Ran Out of Food in the Last Year: Not on file  Transportation Needs:   . Lack of Transportation (Medical): Not on file  . Lack of Transportation (Non-Medical): Not on file  Physical  Activity:   . Days of Exercise per Week: Not on file  . Minutes of Exercise per Session: Not on file  Stress:   . Feeling of Stress : Not on file  Social Connections:   . Frequency of Communication with Friends and Family: Not on file  . Frequency of Social Gatherings with Friends and Family: Not on file  . Attends Religious Services: Not on file  . Active Member of Clubs or Organizations: Not on file  . Attends Banker Meetings: Not on file  . Marital Status: Not on file    Family History: The patient's family history includes Alcoholism in his sister; Diabetes in his mother; Drug abuse in his sister; Kidney failure in his father; Prostate cancer in his father.  ROS:   Please see the history of present illness.    All other systems reviewed and are negative.  EKGs/Labs/Other Studies Reviewed:    The following studies were reviewed today:  EKG:  EKG is ordered today.  The ekg ordered today demonstrates atrial fibrillation with RVR, 127 bpm, previously in sinus rhythm, this was personally reviewed.  TTE 11/26/2019  1. Left ventricular ejection fraction, by estimation, is 55 to 60%. The  left ventricle has normal function. The left ventricle has no regional  wall motion abnormalities. Left ventricular diastolic parameters are  consistent with Grade I diastolic  dysfunction (impaired relaxation).  2. Right ventricular systolic function is normal. The right ventricular  size is normal. Tricuspid regurgitation signal is inadequate for assessing  PA pressure.  3. The mitral valve is grossly normal. No evidence of mitral valve  regurgitation. No evidence of mitral stenosis.  4. The aortic valve is tricuspid. Aortic valve regurgitation is mild. No  aortic stenosis is present. Aortic regurgitation PHT measures 658 msec.  5. Aortic dilatation noted. There is mild dilatation of the aortic root  measuring 41 mm.  6. The inferior vena cava is normal in size with greater  than 50%  respiratory variability, suggesting right atrial pressure of 3 mmHg.   Recent Labs: 11/23/2019: ALT 23; BUN 13; Creatinine, Ser 1.13; Hemoglobin 14.7; NT-Pro BNP 70; Platelets 373; Potassium 4.3; Sodium 140; TSH 3.950  Recent Lipid Panel    Component Value Date/Time   CHOL 79 (L) 11/23/2019 1156   TRIG 51 11/23/2019 1156   HDL 25 (L) 11/23/2019 1156   CHOLHDL 3.2 11/23/2019 1156   CHOLHDL 3 01/11/2015 1248   VLDL 8.6 01/11/2015 1248   LDLCALC 41 11/23/2019 1156   Physical Exam:    VS:  BP 136/84   Pulse 86   Ht 5\' 10"  (1.778 m)   Wt 221 lb 6.4 oz (100.4 kg)   SpO2 91%   BMI 31.77 kg/m     Wt Readings from Last 3 Encounters:  12/03/19 221 lb 6.4 oz (100.4 kg)  11/23/19 222 lb 6.4 oz (100.9 kg)  11/18/19 220 lb (99.8 kg)     GEN:  Well nourished, well developed in no acute distress HEENT: Normal NECK: No JVD; No carotid bruits LYMPHATICS: No lymphadenopathy CARDIAC: iRRR, no murmurs, rubs, gallops RESPIRATORY:  Clear to auscultation without rales, wheezing or rhonchi  ABDOMEN: Soft, non-tender, non-distended MUSCULOSKELETAL:  No edema; No deformity  SKIN: Warm and dry NEUROLOGIC:  Alert and oriented x 3 PSYCHIATRIC:  Normal affect    ASSESSMENT:    1. PAF (paroxysmal atrial fibrillation) ([place])   2. Coronary artery disease involving native coronary artery of native heart without angina pectoris   3. Hyperlipidemia, unspecified hyperlipidemia type   4. Essential hypertension   5. Ascending aortic aneurysm (HCC)    PLAN:    In order of problems listed above:  1. Paroxysmal atrial fibrillation, RVR-we started the patient on Cardizem and Eliquis at the last visit and he cardioverted back to sinus rhythm.  He feels like he has slightly more energy.  He is echocardiogram showed normal LVEF and normal left atrial size. 2. Shortness of breath and dyspnea on exertion -his labs were normal at the last visit including creatinine and BNP, he has no signs of fluid  overload, his shortness of breath is most probably multifactorial secondary to deconditioning and weight gain after he retired, A. fib with RVR and COPD.  He is advised to walk or use stationary bike 5 times a week. 3. CAD with moderate nonobstructive disease and CAD, mild in LAD and circumflex,  he is quit smoking 4 years ago.  We will continue atorvastatin, Eliquis, and losartan. 4. Hyperlipidemia on Lipitor 80 mg daily, will repeat lipids and liver function test today. 5. Hypertension -proved with Cardizem CD 120 mg daily.  We will continue, he was also mailed blood pressure cuff and will send [place] a diary. 6. Lung nodule and pneumonia - follow up scan in December 2020 showed Lung-RADS 2, benign appearance or behavior. Continue annual screening with low-dose chest CT without contrast in 12 months. The dominant nodular opacity seen in the anterior left upper lobe on the previous study now represents a cluster of micro nodules having a configuration suggesting tree-in-bud pattern. Features likely reflect infectious/inflammatory etiology and atypical infection would be a consideration. 7. Ascending aortic aneurysm measuring 41 mm on most recent echo in February 2021, will repeat annually.   Medication Adjustments/Labs and Tests Ordered: Current medicines are reviewed at length with the patient today.  Concerns regarding medicines are outlined above.  Orders Placed This Encounter  Procedures  . EKG 12-Lead   No orders of the defined types were placed in this encounter.   Patient Instructions  Medication Instructions:   Your physician recommends that you continue on your current medications as directed. Please refer to the Current Medication list given to you today.  *If you need a refill on your cardiac medications before your next appointment, please call your pharmacy*   Follow-Up: At [facility], you and your health needs are our priority.  As part of our continuing mission to provide you  with exceptional heart care, we have created designated Provider Care Teams.  These Care Teams include your primary Cardiologist (physician) and Advanced Practice Providers (APPs -  Physician Assistants and Nurse Practitioners) who all work together to provide you with the care you need, when you need it.  We recommend signing up for the patient portal called "MyChart".  Sign up information is provided on this After Visit Summary.  MyChart is used to connect with patients for Virtual Visits (Telemedicine).  Patients are able to view lab/test results, encounter notes, upcoming appointments, etc.  Non-urgent messages can be sent to your provider as well.   To learn more about what you can do with MyChart, go to [email or web address].    Your next appointment:   3 month(s)  The format for your next appointment:   In Person  Provider:   Tobias Alexander, MD       Signed, Tobias Alexander, MD  12/03/2019 11:54 AM    [facility]

## 2019-12-03 NOTE — Patient Instructions (Addendum)
Medication Instructions:   Your physician recommends that you continue on your current medications as directed. Please refer to the Current Medication list given to you today.  *If you need a refill on your cardiac medications before your next appointment, please call your pharmacy*   Follow-Up: At [facility], you and your health needs are our priority.  As part of our continuing mission to provide you with exceptional heart care, we have created designated Provider Care Teams.  These Care Teams include your primary Cardiologist (physician) and Advanced Practice Providers (APPs -  Physician Assistants and Nurse Practitioners) who all work together to provide you with the care you need, when you need it.  We recommend signing up for the patient portal called "MyChart".  Sign up information is provided on this After Visit Summary.  MyChart is used to connect with patients for Virtual Visits (Telemedicine).  Patients are able to view lab/test results, encounter notes, upcoming appointments, etc.  Non-urgent messages can be sent to your provider as well.   To learn more about what you can do with MyChart, go to [email or web address].    Your next appointment:   3 month(s)  The format for your next appointment:   In Person  Provider:   Tobias Alexander, MD

## 2019-12-31 ENCOUNTER — Other Ambulatory Visit: Payer: Self-pay | Admitting: Cardiology

## 2019-12-31 NOTE — Telephone Encounter (Signed)
Pt last saw Dr Delton See 11/23/19, last labs 11/23/19 Creat 1.13, age 69, weight 100.4kg, based on specified criteria pt is on appropriate dosage of Eliquis 5mg  BID.  Will refill rx.

## 2020-01-03 DIAGNOSIS — J449 Chronic obstructive pulmonary disease, unspecified: Secondary | ICD-10-CM | POA: Diagnosis not present

## 2020-02-02 DIAGNOSIS — J449 Chronic obstructive pulmonary disease, unspecified: Secondary | ICD-10-CM | POA: Diagnosis not present

## 2020-02-04 ENCOUNTER — Other Ambulatory Visit: Payer: Self-pay | Admitting: Pulmonary Disease

## 2020-03-04 DIAGNOSIS — J449 Chronic obstructive pulmonary disease, unspecified: Secondary | ICD-10-CM | POA: Diagnosis not present

## 2020-03-09 ENCOUNTER — Encounter: Payer: Self-pay | Admitting: Cardiology

## 2020-03-09 ENCOUNTER — Other Ambulatory Visit: Payer: Self-pay

## 2020-03-09 ENCOUNTER — Ambulatory Visit: Payer: Medicare Other | Admitting: Cardiology

## 2020-03-09 VITALS — BP 128/78 | HR 85 | Ht 70.0 in | Wt 222.4 lb

## 2020-03-09 DIAGNOSIS — J9611 Chronic respiratory failure with hypoxia: Secondary | ICD-10-CM | POA: Diagnosis not present

## 2020-03-09 DIAGNOSIS — I2510 Atherosclerotic heart disease of native coronary artery without angina pectoris: Secondary | ICD-10-CM

## 2020-03-09 DIAGNOSIS — Z87891 Personal history of nicotine dependence: Secondary | ICD-10-CM | POA: Diagnosis not present

## 2020-03-09 DIAGNOSIS — I480 Paroxysmal atrial fibrillation: Secondary | ICD-10-CM | POA: Diagnosis not present

## 2020-03-09 DIAGNOSIS — Z122 Encounter for screening for malignant neoplasm of respiratory organs: Secondary | ICD-10-CM | POA: Diagnosis not present

## 2020-03-09 DIAGNOSIS — E785 Hyperlipidemia, unspecified: Secondary | ICD-10-CM

## 2020-03-09 NOTE — Progress Notes (Signed)
Cardiology Office Note:    Date:  03/09/2020   ID:  Nicholas Camacho, DOB 1951-06-26, MRN [id number]  PCP:  Farris Has, MD  Cardiologist:  Tobias Alexander, MD  Electrophysiologist:  None   Referring MD: Farris Has, MD   Chief complaint: Shortness of breath  History of Present Illness:    Nicholas Camacho is a 69 y.o. male with a hx of PAF chads BASC equals 3 on Eliquis (had GI bleed on Xarelto), hypertension, HLD, COPD, OSA, small AAA and iliac aneurysm.  Coronary CTA 08/20/17 for mild fatigue on exertion showed calcium score of 3244 which was the 99th percentile for age and sex.  Moderate stenosis in the LAD and RCA with suspicion for severe stenosis in the mid RCA.  Cardiac catheterization 08/30/17 showed moderate nonobstructive calcific stenosis in the RCA with mild nonobstructive calcific stenosis in the LAD and circumflex normal LV function.  Medical management was recommended as well as smoking cessation.  See below for complete details.  12/23/2017 - the patient was complaining of dyspnea on exertion especially when walking stairs. It was believed to be related to COPD, diltiazem, ASA was stopped, losartan was increased and inhalers were prescribed.   06/19/2019 - 18 months follow up, he is doing well from cardiac standpoint, he has retired a year ago and helps with his grandkids. He has been evaluated for hematochezia, underwent colonoscopy with findings of hemorrhoids only. Dr Kateri Plummer obtained his labs and he has no anemia (we will obtain those). He also underwent chest CTA that showed:  1. Lung-RADS 4A, suspicious. Follow up low-dose chest CT without contrast in 3 months (please use the following order, "CT CHEST LCS NODULE FOLLOW-UP W/O CM") is recommended. 2. Clustered tree-in-bud nodularity in the left upper lobe/lingula and medial right lower lobe, favoring infection. Dominant lingular nodules measure up to 9.8 mm. He has been started on Augmentin for presumed  pneumonia. Denies chest pain, LE edema, he is complaint with his meds.  11/23/2019, the patient is coming after 6 months, he has been experiencing worsening dyspnea on exertion, he says he has to stop any walks flight of stairs, he denies any orthopnea or proximal nocturnal dyspnea.  No lower extremity edema, no chest pain.  He states that he is unable to do activities he was able to do easily about a year ago, he has also experienced 20 pound weight again that he attributed to decreased exercise activity.  12/03/2019 -the patient is coming after 10 days, he feels like he has slightly more energy, he does not feel palpitations in our, he continues to feel short of breath however he is now using DuoNeb twice a day and his shortness of breath has improved.  He has been compliant with his medications has no side effects.  Denies any lower extremity edema or orthopnea.  03/09/2020 -the patient is coming after 3 months, he continues to feel well, he denies any palpitations.  No chest pain, gets short of breath on moderate exertion like mowing his yard that he avoids.  He continues to do light yard work.  He admits that he is not consistent with using his medication including his inhalers.  He feels congested.  Chronic cough but no productive cough.  No fevers.  No bleeding.   Past Medical History:  Diagnosis Date  . Disturbance of skin sensation 06/24/2013  . Elevated cholesterol   . History of echocardiogram    Echo 4/17: EF 60-65%, no RWMA, Gr 2 DD  .  Sleep disturbance, unspecified 06/24/2013    Past Surgical History:  Procedure Laterality Date  . ESOPHAGOGASTRODUODENOSCOPY N/A 01/08/2016   Procedure: ESOPHAGOGASTRODUODENOSCOPY (EGD);  Surgeon: Carman Ching, MD;  Location: [facility] ENDOSCOPY;  Service: Endoscopy;  Laterality: N/A;  . fractured pelvis    . INGUINAL HERNIA REPAIR Bilateral   . LEFT HEART CATH AND CORONARY ANGIOGRAPHY N/A 08/30/2017   Procedure: LEFT HEART CATH AND CORONARY ANGIOGRAPHY;   Surgeon: Kathleene Hazel, MD;  Location: [facility] INVASIVE CV LAB;  Service: Cardiovascular;  Laterality: N/A;  . reconstruct left heel  2002  . TONSILLECTOMY     Current Medications: Current Meds  Medication Sig  . atorvastatin (LIPITOR) 80 MG tablet TAKE 1 TABLET BY MOUTH EVERY DAY  . diltiazem (CARDIZEM CD) 120 MG 24 hr capsule Take 1 capsule (120 mg total) by mouth at bedtime.  [name] ELIQUIS 5 MG TABS tablet TAKE 1 TABLET BY MOUTH TWICE DAILY( OVERDUE FOR FOLLOW UP, NEEDS TO SCHEDULE FOR FURTHER REFILLS)  . fluticasone (FLONASE) 50 MCG/ACT nasal spray Place 2 sprays into both nostrils 2 (two) times daily.  . Fluticasone-Umeclidin-Vilant (TRELEGY ELLIPTA) 100-62.5-25 MCG/INH AEPB Inhale 1 puff into the lungs daily.  [name] ipratropium-albuterol (DUONEB) 0.5-2.5 (3) MG/3ML SOLN Take 3 mLs by nebulization 3 (three) times daily.  [name] losartan (COZAAR) 50 MG tablet TAKE 1 TABLET(50 MG) BY MOUTH DAILY  . Respiratory Therapy Supplies (FLUTTER) DEVI 1 Device by Does not apply route in the morning and at bedtime.  . VENTOLIN HFA 108 (90 Base) MCG/ACT inhaler INHALE 2 PUFFS BY MOUTH EVERY 6 HOURS AS NEEDED FOR WHEEZING OR SHORTNESS OF BREATH    Allergies:   Morphine and related   Social History   Socioeconomic History  . Marital status: Married    Spouse name: Not on file  . Number of children: 2  . Years of education: COLLEGE  . Highest education level: Not on file  Occupational History  . Occupation: pest control  Tobacco Use  . Smoking status: Former Smoker    Packs/day: 1.50    Years: 45.00    Pack years: 67.50    Types: Cigarettes    Quit date: 11/19/2015    Years since quitting: 4.3  . Smokeless tobacco: Never Used  . Tobacco comment: pt quit smoking!! 11/19/15  Substance and Sexual Activity  . Alcohol use: No    Alcohol/week: 0.0 standard drinks  . Drug use: No  . Sexual activity: Not on file  Other Topics Concern  . Not on file  Social History Narrative  . Not on file   Social  Determinants of Health   Financial Resource Strain:   . Difficulty of Paying Living Expenses:   Food Insecurity:   . Worried About Programme researcher, broadcasting/film/video in the Last Year:   . Barista in the Last Year:   Transportation Needs:   . Freight forwarder (Medical):   [name] Lack of Transportation (Non-Medical):   Physical Activity:   . Days of Exercise per Week:   . Minutes of Exercise per Session:   Stress:   . Feeling of Stress :   Social Connections:   . Frequency of Communication with Friends and Family:   . Frequency of Social Gatherings with Friends and Family:   . Attends Religious Services:   . Active Member of Clubs or Organizations:   . Attends Banker Meetings:   [name] Marital Status:     Family History: The patient's family history includes  Alcoholism in his sister; Diabetes in his mother; Drug abuse in his sister; Kidney failure in his father; Prostate cancer in his father.  ROS:   Please see the history of present illness.    All other systems reviewed and are negative.  EKGs/Labs/Other Studies Reviewed:    The following studies were reviewed today:  EKG:  EKG is ordered today.  The ekg ordered today demonstrates atrial fibrillation with RVR, 127 bpm, previously in sinus rhythm, this was personally reviewed.  TTE 11/26/2019  1. Left ventricular ejection fraction, by estimation, is 55 to 60%. The  left ventricle has normal function. The left ventricle has no regional  wall motion abnormalities. Left ventricular diastolic parameters are  consistent with Grade I diastolic  dysfunction (impaired relaxation).  2. Right ventricular systolic function is normal. The right ventricular  size is normal. Tricuspid regurgitation signal is inadequate for assessing  PA pressure.  3. The mitral valve is grossly normal. No evidence of mitral valve  regurgitation. No evidence of mitral stenosis.  4. The aortic valve is tricuspid. Aortic valve regurgitation is  mild. No  aortic stenosis is present. Aortic regurgitation PHT measures 658 msec.  5. Aortic dilatation noted. There is mild dilatation of the aortic root  measuring 41 mm.  6. The inferior vena cava is normal in size with greater than 50%  respiratory variability, suggesting right atrial pressure of 3 mmHg.   Recent Labs: 11/23/2019: ALT 23; BUN 13; Creatinine, Ser 1.13; Hemoglobin 14.7; NT-Pro BNP 70; Platelets 373; Potassium 4.3; Sodium 140; TSH 3.950  Recent Lipid Panel    Component Value Date/Time   CHOL 79 (L) 11/23/2019 1156   TRIG 51 11/23/2019 1156   HDL 25 (L) 11/23/2019 1156   CHOLHDL 3.2 11/23/2019 1156   CHOLHDL 3 01/11/2015 1248   VLDL 8.6 01/11/2015 1248   LDLCALC 41 11/23/2019 1156   Physical Exam:    VS:  BP 128/78   Pulse 85   Ht 5\' 10"  (1.778 m)   Wt 222 lb 6.4 oz (100.9 kg)   SpO2 90%   BMI 31.91 kg/m     Wt Readings from Last 3 Encounters:  03/09/20 222 lb 6.4 oz (100.9 kg)  12/03/19 221 lb 6.4 oz (100.4 kg)  11/23/19 222 lb 6.4 oz (100.9 kg)     GEN:  Well nourished, well developed in no acute distress HEENT: Normal NECK: No JVD; No carotid bruits LYMPHATICS: No lymphadenopathy CARDIAC: iRRR, no murmurs, rubs, gallops RESPIRATORY:  Clear to auscultation without rales, wheezing or rhonchi  ABDOMEN: Soft, non-tender, non-distended MUSCULOSKELETAL:  No edema; No deformity  SKIN: Warm and dry NEUROLOGIC:  Alert and oriented x 3 PSYCHIATRIC:  Normal affect    ASSESSMENT:    1. Former smoker   2. Chronic respiratory failure with hypoxia (HCC)   3. Encounter for screening for lung cancer   4. PAF (paroxysmal atrial fibrillation) ([place])   5. Coronary artery disease involving native coronary artery of native heart without angina pectoris   6. Hyperlipidemia, unspecified hyperlipidemia type    PLAN:    In order of problems listed above:  1. Paroxysmal atrial fibrillation, RVR-we started the patient on Cardizem and Eliquis in February, he  remains in sinus rhythm since then. 2. Shortness of breath and dyspnea on exertion -his labs were normal at the last visit including creatinine and BNP, he has no signs of fluid overload, his shortness of breath is most probably multifactorial secondary to deconditioning and weight gain after  he retired, he is again advised to walk or use stationary bike 5 times a week. 3. CAD with moderate nonobstructive disease and CAD, mild in LAD and circumflex, he is quit smoking 4 years ago.  We will continue atorvastatin, Eliquis, and losartan. 4. Hyperlipidemia on Lipitor 80 mg daily, will repeat lipids and liver function test today. 5. Hypertension -proved with Cardizem CD 120 mg daily.  We will continue, he was also mailed blood pressure cuff and will send [place] a diary. 6. Lung nodule and pneumonia - follow up scan in December 2020 showed Lung-RADS 2, benign appearance or behavior.  We will repeat scan in September 2021. 7. Ascending aortic aneurysm measuring 41 mm on most recent echo in February 2021, will repeat annually.  Medication Adjustments/Labs and Tests Ordered: Current medicines are reviewed at length with the patient today.  Concerns regarding medicines are outlined above.  Orders Placed This Encounter  Procedures  . CT Chest Wo Contrast  . EKG 12-Lead   No orders of the defined types were placed in this encounter.  Patient Instructions  Medication Instructions:   Your physician recommends that you continue on your current medications as directed. Please refer to the Current Medication list given to you today.  *If you need a refill on your cardiac medications before your next appointment, please call your pharmacy*   Testing/Procedures:  CHEST CT WITHOUT CONTRAST TO BE DONE HERE IN THE OFFICE IN September 2021 PER DR. Delton See   Follow-Up: At [facility], you and your health needs are our priority.  As part of our continuing mission to provide you with exceptional heart care, we  have created designated Provider Care Teams.  These Care Teams include your primary Cardiologist (physician) and Advanced Practice Providers (APPs -  Physician Assistants and Nurse Practitioners) who all work together to provide you with the care you need, when you need it.  We recommend signing up for the patient portal called "MyChart".  Sign up information is provided on this After Visit Summary.  MyChart is used to connect with patients for Virtual Visits (Telemedicine).  Patients are able to view lab/test results, encounter notes, upcoming appointments, etc.  Non-urgent messages can be sent to your provider as well.   To learn more about what you can do with MyChart, go to [email or web address].    Your next appointment:   6 month(s)  The format for your next appointment:   In Person  Provider:   Tobias Alexander, MD       Signed, Tobias Alexander, MD  03/09/2020 10:53 AM    [facility]

## 2020-03-09 NOTE — Patient Instructions (Signed)
Medication Instructions:   Your physician recommends that you continue on your current medications as directed. Please refer to the Current Medication list given to you today.  *If you need a refill on your cardiac medications before your next appointment, please call your pharmacy*   Testing/Procedures:  CHEST CT WITHOUT CONTRAST TO BE DONE HERE IN THE OFFICE IN September 2021 PER DR. Delton See   Follow-Up: At [facility], you and your health needs are our priority.  As part of our continuing mission to provide you with exceptional heart care, we have created designated Provider Care Teams.  These Care Teams include your primary Cardiologist (physician) and Advanced Practice Providers (APPs -  Physician Assistants and Nurse Practitioners) who all work together to provide you with the care you need, when you need it.  We recommend signing up for the patient portal called "MyChart".  Sign up information is provided on this After Visit Summary.  MyChart is used to connect with patients for Virtual Visits (Telemedicine).  Patients are able to view lab/test results, encounter notes, upcoming appointments, etc.  Non-urgent messages can be sent to your provider as well.   To learn more about what you can do with MyChart, go to [email or web address].    Your next appointment:   6 month(s)  The format for your next appointment:   In Person  Provider:   Tobias Alexander, MD

## 2020-03-18 DIAGNOSIS — Z0000 Encounter for general adult medical examination without abnormal findings: Secondary | ICD-10-CM | POA: Diagnosis not present

## 2020-03-18 DIAGNOSIS — E538 Deficiency of other specified B group vitamins: Secondary | ICD-10-CM | POA: Diagnosis not present

## 2020-03-18 DIAGNOSIS — E785 Hyperlipidemia, unspecified: Secondary | ICD-10-CM | POA: Diagnosis not present

## 2020-03-18 DIAGNOSIS — I4891 Unspecified atrial fibrillation: Secondary | ICD-10-CM | POA: Diagnosis not present

## 2020-03-21 DIAGNOSIS — I10 Essential (primary) hypertension: Secondary | ICD-10-CM | POA: Diagnosis not present

## 2020-03-21 DIAGNOSIS — E538 Deficiency of other specified B group vitamins: Secondary | ICD-10-CM | POA: Diagnosis not present

## 2020-03-21 DIAGNOSIS — Z0000 Encounter for general adult medical examination without abnormal findings: Secondary | ICD-10-CM | POA: Diagnosis not present

## 2020-03-21 DIAGNOSIS — E785 Hyperlipidemia, unspecified: Secondary | ICD-10-CM | POA: Diagnosis not present

## 2020-04-03 DIAGNOSIS — J449 Chronic obstructive pulmonary disease, unspecified: Secondary | ICD-10-CM | POA: Diagnosis not present

## 2020-04-26 ENCOUNTER — Other Ambulatory Visit: Payer: Self-pay | Admitting: Cardiology

## 2020-05-02 DIAGNOSIS — E538 Deficiency of other specified B group vitamins: Secondary | ICD-10-CM | POA: Diagnosis not present

## 2020-05-04 DIAGNOSIS — J449 Chronic obstructive pulmonary disease, unspecified: Secondary | ICD-10-CM | POA: Diagnosis not present

## 2020-05-10 ENCOUNTER — Other Ambulatory Visit: Payer: Self-pay | Admitting: Pulmonary Disease

## 2020-05-26 ENCOUNTER — Telehealth: Payer: Self-pay | Admitting: Critical Care Medicine

## 2020-05-26 DIAGNOSIS — J449 Chronic obstructive pulmonary disease, unspecified: Secondary | ICD-10-CM

## 2020-05-26 MED ORDER — FLUTICASONE PROPIONATE 50 MCG/ACT NA SUSP: 2.0000 | Freq: Two times a day (BID) | NASAL | 3 refills | Status: DC

## 2020-05-26 NOTE — Telephone Encounter (Signed)
Called and spoke with pt to verify which Rx he needed refilled. Verified preferred pharmacy and sent Rx for Flonase nasal spray to pharmacy for pt. Nothing further needed.

## 2020-06-01 ENCOUNTER — Emergency Department (HOSPITAL_COMMUNITY): Payer: Medicare Other

## 2020-06-01 ENCOUNTER — Ambulatory Visit
Admission: RE | Admit: 2020-06-01 | Discharge: 2020-06-01 | Disposition: A | Discharge status: Home / Self Care | Payer: Medicare Other | Source: Ambulatory Visit | Attending: Cardiology | Admitting: Cardiology

## 2020-06-01 ENCOUNTER — Other Ambulatory Visit: Payer: Self-pay

## 2020-06-01 ENCOUNTER — Encounter (HOSPITAL_COMMUNITY): Payer: Self-pay | Admitting: Emergency Medicine

## 2020-06-01 ENCOUNTER — Observation Stay (HOSPITAL_COMMUNITY)
Admission: EM | Admit: 2020-06-01 | Discharge: 2020-06-03 | Disposition: A | Discharge status: Home / Self Care | Payer: Medicare Other | Attending: Internal Medicine | Admitting: Internal Medicine

## 2020-06-01 DIAGNOSIS — R911 Solitary pulmonary nodule: Secondary | ICD-10-CM | POA: Diagnosis not present

## 2020-06-01 DIAGNOSIS — Z122 Encounter for screening for malignant neoplasm of respiratory organs: Secondary | ICD-10-CM

## 2020-06-01 DIAGNOSIS — I639 Cerebral infarction, unspecified: Principal | ICD-10-CM | POA: Insufficient documentation

## 2020-06-01 DIAGNOSIS — R4781 Slurred speech: Secondary | ICD-10-CM

## 2020-06-01 DIAGNOSIS — I2510 Atherosclerotic heart disease of native coronary artery without angina pectoris: Secondary | ICD-10-CM | POA: Diagnosis present

## 2020-06-01 DIAGNOSIS — Z7901 Long term (current) use of anticoagulants: Secondary | ICD-10-CM | POA: Insufficient documentation

## 2020-06-01 DIAGNOSIS — R0602 Shortness of breath: Secondary | ICD-10-CM | POA: Diagnosis not present

## 2020-06-01 DIAGNOSIS — Z6832 Body mass index (BMI) 32.0-32.9, adult: Secondary | ICD-10-CM | POA: Diagnosis not present

## 2020-06-01 DIAGNOSIS — I719 Aortic aneurysm of unspecified site, without rupture: Secondary | ICD-10-CM | POA: Insufficient documentation

## 2020-06-01 DIAGNOSIS — J449 Chronic obstructive pulmonary disease, unspecified: Secondary | ICD-10-CM | POA: Diagnosis not present

## 2020-06-01 DIAGNOSIS — Z20822 Contact with and (suspected) exposure to covid-19: Secondary | ICD-10-CM | POA: Insufficient documentation

## 2020-06-01 DIAGNOSIS — E669 Obesity, unspecified: Secondary | ICD-10-CM | POA: Diagnosis not present

## 2020-06-01 DIAGNOSIS — Z87891 Personal history of nicotine dependence: Secondary | ICD-10-CM | POA: Insufficient documentation

## 2020-06-01 DIAGNOSIS — I7090 Unspecified atherosclerosis: Secondary | ICD-10-CM | POA: Diagnosis not present

## 2020-06-01 DIAGNOSIS — I491 Atrial premature depolarization: Secondary | ICD-10-CM | POA: Diagnosis not present

## 2020-06-01 DIAGNOSIS — J439 Emphysema, unspecified: Secondary | ICD-10-CM | POA: Diagnosis not present

## 2020-06-01 DIAGNOSIS — J9611 Chronic respiratory failure with hypoxia: Secondary | ICD-10-CM

## 2020-06-01 DIAGNOSIS — J3489 Other specified disorders of nose and nasal sinuses: Secondary | ICD-10-CM | POA: Diagnosis not present

## 2020-06-01 DIAGNOSIS — R29810 Facial weakness: Secondary | ICD-10-CM | POA: Diagnosis not present

## 2020-06-01 DIAGNOSIS — E785 Hyperlipidemia, unspecified: Secondary | ICD-10-CM | POA: Insufficient documentation

## 2020-06-01 DIAGNOSIS — G9389 Other specified disorders of brain: Secondary | ICD-10-CM | POA: Diagnosis not present

## 2020-06-01 DIAGNOSIS — I10 Essential (primary) hypertension: Secondary | ICD-10-CM | POA: Diagnosis not present

## 2020-06-01 DIAGNOSIS — I4891 Unspecified atrial fibrillation: Secondary | ICD-10-CM | POA: Diagnosis present

## 2020-06-01 DIAGNOSIS — I6782 Cerebral ischemia: Secondary | ICD-10-CM | POA: Diagnosis not present

## 2020-06-01 DIAGNOSIS — I482 Chronic atrial fibrillation, unspecified: Secondary | ICD-10-CM | POA: Diagnosis not present

## 2020-06-01 DIAGNOSIS — E6609 Other obesity due to excess calories: Secondary | ICD-10-CM

## 2020-06-01 HISTORY — DX: Cardiac arrhythmia, unspecified: I49.9

## 2020-06-01 HISTORY — DX: Chronic obstructive pulmonary disease, unspecified: J44.9

## 2020-06-01 HISTORY — DX: Essential (primary) hypertension: I10

## 2020-06-01 LAB — CBC
HCT: 46.3 % (ref 39.0–52.0)
Hemoglobin: 14.2 g/dL (ref 13.0–17.0)
MCH: 27.2 pg (ref 26.0–34.0)
MCHC: 30.7 g/dL (ref 30.0–36.0)
MCV: 88.7 fL (ref 80.0–100.0)
Platelets: 265 10*3/uL (ref 150–400)
RBC: 5.22 MIL/uL (ref 4.22–5.81)
RDW: 14.3 % (ref 11.5–15.5)
WBC: 8.6 10*3/uL (ref 4.0–10.5)
nRBC: 0 % (ref 0.0–0.2)

## 2020-06-01 LAB — COMPREHENSIVE METABOLIC PANEL
ALT: 21 U/L (ref 0–44)
AST: 19 U/L (ref 15–41)
Albumin: 3.6 g/dL (ref 3.5–5.0)
Alkaline Phosphatase: 103 U/L (ref 38–126)
Anion gap: 9 (ref 5–15)
BUN: 12 mg/dL (ref 8–23)
CO2: 26 mmol/L (ref 22–32)
Calcium: 9.5 mg/dL (ref 8.9–10.3)
Chloride: 104 mmol/L (ref 98–111)
Creatinine, Ser: 1.02 mg/dL (ref 0.61–1.24)
GFR calc Af Amer: >60 mL/min (ref >60)
GFR calc non Af Amer: >60 mL/min (ref >60)
Glucose, Bld: 136 mg/dL — ABNORMAL HIGH (ref 70–99)
Potassium: 4.2 mmol/L (ref 3.5–5.1)
Sodium: 139 mmol/L (ref 135–145)
Total Bilirubin: 0.6 mg/dL (ref 0.3–1.2)
Total Protein: 6.8 g/dL (ref 6.5–8.1)

## 2020-06-01 LAB — APTT: aPTT: 31 seconds (ref 24–36)

## 2020-06-01 LAB — DIFFERENTIAL
Abs Immature Granulocytes: 0.04 10*3/uL (ref 0.00–0.07)
Basophils Absolute: 0 10*3/uL (ref 0.0–0.1)
Basophils Relative: 1 %
Eosinophils Absolute: 0.4 10*3/uL (ref 0.0–0.5)
Eosinophils Relative: 4 %
Immature Granulocytes: 1 %
Lymphocytes Relative: 17 %
Lymphs Abs: 1.4 10*3/uL (ref 0.7–4.0)
Monocytes Absolute: 0.9 10*3/uL (ref 0.1–1.0)
Monocytes Relative: 10 %
Neutro Abs: 5.8 10*3/uL (ref 1.7–7.7)
Neutrophils Relative %: 67 %

## 2020-06-01 LAB — PROTIME-INR
INR: 1.1 (ref 0.8–1.2)
Prothrombin Time: 13.6 seconds (ref 11.4–15.2)

## 2020-06-01 NOTE — ED Triage Notes (Addendum)
Patient arrives to ED with complaints of intermittent slurred speech since Monday night. Per pt for the past two days his slurred speech last 30 minutes then resolves. No other neuro abnormalities reported or observed. Patient speech at the moment is normal and pt denies vision, balance, and sensory abnormalities.  No weakness noted. Pt was noted to have mass on lungs and was due for repeat scan to check on size this September.

## 2020-06-02 ENCOUNTER — Inpatient Hospital Stay (HOSPITAL_COMMUNITY): Payer: Medicare Other

## 2020-06-02 ENCOUNTER — Emergency Department (HOSPITAL_COMMUNITY): Payer: Medicare Other

## 2020-06-02 ENCOUNTER — Inpatient Hospital Stay (HOSPITAL_BASED_OUTPATIENT_CLINIC_OR_DEPARTMENT_OTHER): Payer: Medicare Other

## 2020-06-02 ENCOUNTER — Emergency Department (HOSPITAL_BASED_OUTPATIENT_CLINIC_OR_DEPARTMENT_OTHER): Payer: Medicare Other

## 2020-06-02 ENCOUNTER — Encounter (HOSPITAL_COMMUNITY): Payer: Self-pay | Admitting: Internal Medicine

## 2020-06-02 DIAGNOSIS — I6389 Other cerebral infarction: Secondary | ICD-10-CM

## 2020-06-02 DIAGNOSIS — E6609 Other obesity due to excess calories: Secondary | ICD-10-CM | POA: Diagnosis not present

## 2020-06-02 DIAGNOSIS — I639 Cerebral infarction, unspecified: Secondary | ICD-10-CM | POA: Diagnosis not present

## 2020-06-02 DIAGNOSIS — I480 Paroxysmal atrial fibrillation: Secondary | ICD-10-CM | POA: Diagnosis not present

## 2020-06-02 DIAGNOSIS — Z6832 Body mass index (BMI) 32.0-32.9, adult: Secondary | ICD-10-CM

## 2020-06-02 DIAGNOSIS — I10 Essential (primary) hypertension: Secondary | ICD-10-CM | POA: Diagnosis not present

## 2020-06-02 DIAGNOSIS — I63412 Cerebral infarction due to embolism of left middle cerebral artery: Secondary | ICD-10-CM

## 2020-06-02 DIAGNOSIS — J449 Chronic obstructive pulmonary disease, unspecified: Secondary | ICD-10-CM | POA: Diagnosis not present

## 2020-06-02 LAB — TSH: TSH: 3.962 u[IU]/mL (ref 0.350–4.500)

## 2020-06-02 LAB — ECHOCARDIOGRAM COMPLETE
Calc EF: 49.1 %
Height: 69 in
S' Lateral: 4.2 cm
Single Plane A2C EF: 54 %
Single Plane A4C EF: 49.6 %
Weight: 3520 oz

## 2020-06-02 LAB — SARS CORONAVIRUS 2 BY RT PCR (HOSPITAL ORDER, PERFORMED IN ~~LOC~~ HOSPITAL LAB): SARS Coronavirus 2: NEGATIVE

## 2020-06-02 LAB — SEDIMENTATION RATE: Sed Rate: 2 mm/hr (ref 0–16)

## 2020-06-02 MED ORDER — STROKE: EARLY STAGES OF RECOVERY BOOK
Freq: Once | Status: AC
  Filled 2020-06-02: qty 1

## 2020-06-02 MED ORDER — GUAIFENESIN-DM 100-10 MG/5ML PO SYRP
5.0000 mL | ORAL_SOLUTION | ORAL | Status: DC | PRN
  Administered 2020-06-02: 5 mL via ORAL
  Filled 2020-06-02: qty 5

## 2020-06-02 MED ORDER — FLUTICASONE-UMECLIDIN-VILANT 100-62.5-25 MCG/INH IN AEPB: 1.0000 | INHALATION_SPRAY | Freq: Every day | RESPIRATORY_TRACT | Status: DC

## 2020-06-02 MED ORDER — ASPIRIN 325 MG PO TABS: 325.0000 mg | ORAL_TABLET | Freq: Every day | ORAL | Status: DC

## 2020-06-02 MED ORDER — LORATADINE 10 MG PO TABS
10.0000 mg | ORAL_TABLET | Freq: Every day | ORAL | Status: DC
  Administered 2020-06-02 – 2020-06-03 (×2): 10 mg via ORAL
  Filled 2020-06-02 (×2): qty 1

## 2020-06-02 MED ORDER — ACETAMINOPHEN 160 MG/5ML PO SOLN: 650.0000 mg | ORAL | Status: DC | PRN

## 2020-06-02 MED ORDER — SENNOSIDES-DOCUSATE SODIUM 8.6-50 MG PO TABS: 1.0000 | ORAL_TABLET | Freq: Every evening | ORAL | Status: DC | PRN

## 2020-06-02 MED ORDER — IPRATROPIUM-ALBUTEROL 0.5-2.5 (3) MG/3ML IN SOLN
3.0000 mL | Freq: Three times a day (TID) | RESPIRATORY_TRACT | Status: DC
  Administered 2020-06-02 – 2020-06-03 (×2): 3 mL via RESPIRATORY_TRACT
  Filled 2020-06-02 (×4): qty 3

## 2020-06-02 MED ORDER — ASPIRIN 300 MG RE SUPP: 300.0000 mg | Freq: Every day | RECTAL | Status: DC

## 2020-06-02 MED ORDER — ALBUTEROL SULFATE (2.5 MG/3ML) 0.083% IN NEBU
3.0000 mL | INHALATION_SOLUTION | Freq: Four times a day (QID) | RESPIRATORY_TRACT | Status: DC | PRN
  Administered 2020-06-02: 3 mL via RESPIRATORY_TRACT
  Filled 2020-06-02 (×2): qty 3

## 2020-06-02 MED ORDER — FLUTTER DEVI: 1.0000 | Freq: Two times a day (BID) | Status: DC

## 2020-06-02 MED ORDER — ACETAMINOPHEN 650 MG RE SUPP: 650.0000 mg | RECTAL | Status: DC | PRN

## 2020-06-02 MED ORDER — ATORVASTATIN CALCIUM 80 MG PO TABS
80.0000 mg | ORAL_TABLET | Freq: Every day | ORAL | Status: DC
  Administered 2020-06-02 – 2020-06-03 (×2): 80 mg via ORAL
  Filled 2020-06-02 (×2): qty 1

## 2020-06-02 MED ORDER — ACETAMINOPHEN 325 MG PO TABS: 650.0000 mg | ORAL_TABLET | ORAL | Status: DC | PRN

## 2020-06-02 MED ORDER — UMECLIDINIUM BROMIDE 62.5 MCG/INH IN AEPB
1.0000 | INHALATION_SPRAY | Freq: Every day | RESPIRATORY_TRACT | Status: DC
  Administered 2020-06-02 – 2020-06-03 (×2): 1 via RESPIRATORY_TRACT
  Filled 2020-06-02: qty 7

## 2020-06-02 MED ORDER — APIXABAN 5 MG PO TABS
5.0000 mg | ORAL_TABLET | Freq: Two times a day (BID) | ORAL | Status: DC
  Administered 2020-06-02 – 2020-06-03 (×3): 5 mg via ORAL
  Filled 2020-06-02 (×3): qty 1

## 2020-06-02 MED ORDER — SODIUM CHLORIDE 0.9 % IV SOLN: INTRAVENOUS | Status: DC

## 2020-06-02 MED ORDER — FLUTICASONE PROPIONATE 50 MCG/ACT NA SUSP
2.0000 | Freq: Two times a day (BID) | NASAL | Status: DC
  Administered 2020-06-02 – 2020-06-03 (×3): 2 via NASAL
  Filled 2020-06-02: qty 16

## 2020-06-02 MED ORDER — FLUTICASONE FUROATE-VILANTEROL 100-25 MCG/INH IN AEPB
1.0000 | INHALATION_SPRAY | Freq: Every day | RESPIRATORY_TRACT | Status: DC
  Administered 2020-06-02 – 2020-06-03 (×2): 1 via RESPIRATORY_TRACT
  Filled 2020-06-02: qty 28

## 2020-06-02 NOTE — ED Provider Notes (Signed)
[facility] Provider Note   CSN: [id number] Arrival date & time: 06/01/20  1755     History Chief Complaint  Patient presents with  . Slurred Speech    Nicholas Camacho is a 69 y.o. male.  HPI Patient developed a isolated right arm sensory change Monday, 4 days ago.  Reports he was going to the bathroom he noticed a warm tingly sensation go over his shoulder and arm.  He did not think much about it at the time.  Following day, Tuesday he awakened and was talking to his wife and noted that his speech was very slurred.  Reports she even has some difficulty understanding.  He did not have any difficulty knowing what he wanted to say for any confusion.  He reports it just seemed like everything was saying was slurred or garbled sounding.  No other associated symptoms.  No headache, no visual changes, no imbalance or incoordination with walking.  No history of similar symptoms.  No difficulty swallowing or drinking.  No prior stroke history.  Patient has otherwise been well recently.  He got Covid immunizations at the beginning of the year.  No fevers, no chills, no myalgias.  Patient has COPD but quit smoking 3 years ago.  No alcohol use.    Past Medical History:  Diagnosis Date  . Disturbance of skin sensation 06/24/2013  . Elevated cholesterol   . History of echocardiogram    Echo 4/17: EF 60-65%, no RWMA, Gr 2 DD  . Sleep disturbance, unspecified 06/24/2013    Patient Active Problem List   Diagnosis Date Noted  . CVA (cerebral vascular accident) (HCC) 06/02/2020  . Allergic rhinitis 07/22/2018  . CAD (coronary artery disease) 08/23/2017  . Pneumonia 08/23/2017  . Chest pain 07/25/2017  . Coronary artery calcification seen on CT scan 07/25/2017  . Leukocytosis 02/26/2017  . Chronic respiratory failure with hypoxia (HCC) 02/11/2017  . Abdominal aortic aneurysm without rupture (HCC) 12/25/2016  . Dizziness 11/28/2016  . Symptomatic anemia  01/06/2016  . Microcytic hypochromic anemia 01/06/2016  . Cough 12/09/2015  . Leg swelling 06/20/2015  . Former smoker 12/03/2013  . Atrial fibrillation (HCC) 09/29/2013  . TIA (transient ischemic attack) 09/17/2013  . Essential hypertension 09/17/2013  . Hyperlipidemia 09/17/2013  . COPD GOLD II B 09/15/2013  . Disturbance of skin sensation 06/24/2013  . Sleep disturbance, unspecified 06/24/2013    Past Surgical History:  Procedure Laterality Date  . ESOPHAGOGASTRODUODENOSCOPY N/A 01/08/2016   Procedure: ESOPHAGOGASTRODUODENOSCOPY (EGD);  Surgeon: [name], MD;  [facility];  Service: Endoscopy;  Laterality: N/A;  . fractured pelvis    . INGUINAL HERNIA REPAIR Bilateral   . LEFT HEART CATH AND CORONARY ANGIOGRAPHY N/A 08/30/2017   Procedure: LEFT HEART CATH AND CORONARY ANGIOGRAPHY;  Surgeon: Kathleene HazelMcAlhany, Christopher D, MD;  Location: [facility] INVASIVE CV LAB;  Service: Cardiovascular;  Laterality: N/A;  . reconstruct left heel  2002  . TONSILLECTOMY         Family History  Problem Relation Age of Onset  . Diabetes Mother   . Prostate cancer Father   . Kidney failure Father   . Alcoholism Sister        history of  . Drug abuse Sister        history of    Social History   Tobacco Use  . Smoking status: Former Smoker    Packs/day: 1.50    Years: 45.00    Pack years: 67.50  Types: Cigarettes    Quit date: 11/19/2015    Years since quitting: 4.5  . Smokeless tobacco: Never Used  . Tobacco comment: pt quit smoking!! 11/19/15  Vaping Use  . Vaping Use: Never used  Substance Use Topics  . Alcohol use: No    Alcohol/week: 0.0 standard drinks  . Drug use: No    Home Medications Prior to Admission medications   Medication Sig Start Date End Date Taking? Authorizing Provider  atorvastatin (LIPITOR) 80 MG tablet TAKE 1 TABLET BY MOUTH EVERY DAY 04/26/20  Yes Lars Masson, MD  Cyanocobalamin 1000 MCG CAPS Take 1,000 mcg by mouth daily.   Yes [provider]  diltiazem (CARDIZEM CD) 120 MG 24 hr capsule Take 1 capsule (120 mg total) by mouth at bedtime. 11/23/19  Yes Lars Masson, MD  ELIQUIS 5 MG TABS tablet TAKE 1 TABLET BY MOUTH TWICE DAILY( OVERDUE FOR FOLLOW UP, NEEDS TO SCHEDULE FOR FURTHER REFILLS) Patient taking differently: Take 5 mg by mouth 2 (two) times daily.  12/31/19  Yes Lars Masson, MD  fexofenadine (ALLEGRA) 180 MG tablet Take 180 mg by mouth daily.   Yes [provider]  fluticasone (FLONASE) 50 MCG/ACT nasal spray Place 2 sprays into both nostrils 2 (two) times daily. 05/26/20  Yes Coral Ceo, NP  Fluticasone-Umeclidin-Vilant (TRELEGY ELLIPTA) 100-62.5-25 MCG/INH AEPB Inhale 1 puff into the lungs daily. 11/18/19  Yes Karie Fetch P, DO  ipratropium-albuterol (DUONEB) 0.5-2.5 (3) MG/3ML SOLN Take 3 mLs by nebulization 3 (three) times daily. 11/18/19  Yes Karie Fetch P, DO  losartan (COZAAR) 50 MG tablet TAKE 1 TABLET(50 MG) BY MOUTH DAILY Patient taking differently: Take 50 mg by mouth daily.  12/01/19  Yes Lars Masson, MD  nitroGLYCERIN (NITROSTAT) 0.4 MG SL tablet Place 1 tablet (0.4 mg total) under the tongue every 5 (five) minutes as needed for chest pain. 06/19/19 06/02/20 Yes Lars Masson, MD  VENTOLIN HFA 108 (90 Base) MCG/ACT inhaler INHALE 2 PUFFS BY MOUTH EVERY 6 HOURS AS NEEDED FOR WHEEZING OR SHORTNESS OF BREATH Patient taking differently: Inhale 2 puffs into the lungs every 6 (six) hours as needed for wheezing or shortness of breath.  05/10/20  Yes Steffanie Dunn, DO  Respiratory Therapy Supplies (FLUTTER) DEVI 1 Device by Does not apply route in the morning and at bedtime. 11/18/19   Steffanie Dunn, DO    Allergies    Morphine and related  Review of Systems   Review of Systems 10 systems reviewed and negative except as per HPI Physical Exam Updated Vital Signs BP (!) 145/93 (BP Location: Left Arm)   Pulse 64   Temp 98 F (36.7 C) (Oral)   Resp 18   Ht 5\' 9"  (1.753 m)    Wt 99.8 kg   SpO2 94%   BMI 32.49 kg/m   Physical Exam Constitutional:      Appearance: He is well-developed.  HENT:     Head: Normocephalic and atraumatic.     Mouth/Throat:     Mouth: Mucous membranes are moist.     Pharynx: Oropharynx is clear.  Eyes:     Extraocular Movements: Extraocular movements intact.     Conjunctiva/sclera: Conjunctivae normal.     Pupils: Pupils are equal, round, and reactive to light.     Comments: Normal consensual pupillary responses.  Cardiovascular:     Rate and Rhythm: Normal rate and regular rhythm.     Heart sounds: Normal heart sounds.  Pulmonary:     Effort: Pulmonary effort is normal.     Breath sounds: Normal breath sounds.  Abdominal:     General: Bowel sounds are normal. There is no distension.     Palpations: Abdomen is soft.     Tenderness: There is no abdominal tenderness.  Musculoskeletal:        General: No swelling or tenderness. Normal range of motion.     Cervical back: Neck supple.     Right lower leg: No edema.     Left lower leg: No edema.  Skin:    General: Skin is warm and dry.  Neurological:     Mental Status: He is alert and oriented to person, place, and time.     GCS: GCS eye subscore is 4. GCS verbal subscore is 5. GCS motor subscore is 6.     Coordination: Coordination normal.     Comments: Speech is suddenly slurred.  No aphasias.  No difficulty with word finding.  Cranial nerves otherwise appear intact.  Possible right tongue deviation with tongue extrusion.  Normal visual fields.  Normal finger-nose exam.  Motor strength 5\5 upper and lower extremities.  Sensation intact to light touch x4.  Psychiatric:        Mood and Affect: Mood normal.     ED Results / Procedures / Treatments   Labs (all labs ordered are listed, but only abnormal results are displayed) Labs Reviewed  COMPREHENSIVE METABOLIC PANEL - Abnormal; Notable for the following components:      Result Value   Glucose, Bld 136 (*)    All  other components within normal limits  SARS CORONAVIRUS 2 BY RT PCR (HOSPITAL ORDER, PERFORMED IN [facility])  PROTIME-INR  APTT  CBC  DIFFERENTIAL  SEDIMENTATION RATE  CBG MONITORING, ED    EKG EKG Interpretation  Date/Time:  Wednesday June 01 2020 17:53:42 EDT Ventricular Rate:  67 PR Interval:  144 QRS Duration: 98 QT Interval:  418 QTC Calculation: 441 R Axis:   87 Text Interpretation: Sinus rhythm with Premature atrial complexes Otherwise normal ECG no sig change since previous Confirmed by Arby Barrette [phone]) on 06/02/2020 8:07:34 AM   Radiology DG Chest 2 View  Result Date: 06/01/2020 CLINICAL DATA:  Shortness of breath EXAM: CHEST - 2 VIEW COMPARISON:  03/07/2018 FINDINGS: Hyperinflation with emphysematous disease. No acute opacity, pleural effusion or pneumothorax. Normal cardiomediastinal silhouette. IMPRESSION: No active cardiopulmonary disease. Hyperinflation with emphysematous disease. Electronically Signed   By: Jasmine Pang M.D.   On: 06/01/2020 19:04   CT HEAD WO CONTRAST  Result Date: 06/01/2020 CLINICAL DATA:  Intermittent slurred speech EXAM: CT HEAD WITHOUT CONTRAST TECHNIQUE: Contiguous axial images were obtained from the base of the skull through the vertex without intravenous contrast. COMPARISON:  None. FINDINGS: Brain: There is no acute intracranial hemorrhage, mass effect, or edema. Gray-white differentiation is preserved. There is no extra-axial fluid collection. Ventricles and sulci are within normal limits in size and configuration. Patchy hypoattenuation in the supratentorial white matter is nonspecific may reflect mild chronic microvascular ischemic changes. Vascular: There is atherosclerotic calcification at the skull base. Skull: Calvarium is unremarkable. Sinuses/Orbits: Lobular mucosal thickening greatest in the maxillary sinuses. Orbits are unremarkable. Other: None. IMPRESSION: No acute intracranial abnormality. Mild chronic  microvascular ischemic changes. Electronically Signed   By: Guadlupe Spanish M.D.   On: 06/01/2020 20:19   MR Brain Wo Contrast (neuro protocol)  Result Date: 06/02/2020 CLINICAL DATA:  Intermittent slurred speech beginning 4  days ago. EXAM: MRI HEAD WITHOUT CONTRAST TECHNIQUE: Multiplanar, multiecho pulse sequences of the brain and surrounding structures were obtained without intravenous contrast. COMPARISON:  Head CT yesterday. FINDINGS: Brain: Diffusion imaging shows a cluster of small acute infarctions at the left frontoparietal junction region. No large confluent infarction. No swelling or hemorrhage. Other region of acute insult. The brainstem and cerebellum are normal. Cerebral hemispheres otherwise show chronic small-vessel ischemic changes of the white matter of a mild-to-moderate degree. No hydrocephalus, mass or extra-axial collection. Vascular: Major vessels at the base of the brain show flow. Skull and upper cervical spine: Negative Sinuses/Orbits: Clear/normal Other: None IMPRESSION: 1. Cluster of small acute infarctions at the left frontoparietal junction region, consistent with left MCA branch vessel insult. No large confluent infarction. No swelling or hemorrhage. 2. Mild to moderate chronic small-vessel ischemic changes of the cerebral hemispheric white matter. Electronically Signed   By: Paulina Fusi M.D.   On: 06/02/2020 09:32    Procedures Procedures (including critical care time)  Medications Ordered in ED Medications   stroke: mapping our early stages of recovery book (has no administration in time range)  atorvastatin (LIPITOR) tablet 80 mg (has no administration in time range)  apixaban (ELIQUIS) tablet 5 mg (has no administration in time range)    ED Course  I have reviewed the triage vital signs and the nursing notes.  Pertinent labs & imaging results that were available during my care of the patient were reviewed by me and considered in my medical decision making (see  chart for details).    MDM Rules/Calculators/A&P                         Consult: Reviewed with neurology Dr. Derry Lory Consult: Dr. Ophelia Charter for admission.  Patient presents as outlined above.  MRI shows small subacute infarcts.  Patient has been symptomatic now for 3 days.  Cognitive function and motor function intact.  Only objective positive finding at this time is slurred speech.  Speech is however very intelligible.  Will admit for medical evaluation for acute stroke.  Final Clinical Impression(s) / ED Diagnoses Final diagnoses:  Cerebrovascular accident (CVA), unspecified mechanism (HCC)  Slurred speech    Rx / DC Orders ED Discharge Orders    None       Arby Barrette, MD 06/02/20 1038

## 2020-06-02 NOTE — Progress Notes (Signed)
Carotid study completed.   See CVProc for preliminary results.   Parrie Rasco, RDMS, RVT 

## 2020-06-02 NOTE — Consult Note (Addendum)
NEUROLOGY CONSULTATION NOTE   Date of service: June 02, 2020 Patient Name: Nicholas Camacho MRN:  [id number] DOB:  1950/12/08 Reason for consult: "Slurred speech, stroke on MRI"  History of Present Illness  Nicholas Camacho is a 69 y.o. male with PMH significant for HLD, p Afibb on Eliquis, HTN, COPD, small AAA, lung nodule who presents with intermittent slurring of his speech. He has had 5 episodes over the last 3 days. The first one happened Tuesday morning. In addition, he also had an episode of flushing down his R arm on Monday night.  Spoke to PCP office and was told to go to ED. Endorses missing 1-2 doses of Eliquis a week, was a former smoker, no EtOH use. + family hx of stroke. No prior personal hx of stroke except for an episode of transient finger numbness 8 years ago.  NIHSS: 0 MRS:0 Tpa: no deficit Thrombectomy: no deficit   ROS   Constitutional Denies weight loss, fever and chills.  HEENT Denies changes in vision and hearing.  Respiratory Denies SOB and cough.  CV Denies palpitations and CP  GI Denies abdominal pain, nausea, vomiting and diarrhea.  GU Denies dysuria and urinary frequency.  MSK Denies myalgia and joint pain.  Skin Denies rash and pruritus.  Neurological Denies headache and syncope.  Psychiatric Denies recent changes in mood. Denies anxiety and depression.   Past History   Past Medical History:  Diagnosis Date  . Disturbance of skin sensation 06/24/2013  . Elevated cholesterol   . History of echocardiogram    Echo 4/17: EF 60-65%, no RWMA, Gr 2 DD  . Sleep disturbance, unspecified 06/24/2013   Past Surgical History:  Procedure Laterality Date  . ESOPHAGOGASTRODUODENOSCOPY N/A 01/08/2016   Procedure: ESOPHAGOGASTRODUODENOSCOPY (EGD);  Surgeon: Carman Ching, MD;  Location[facility] ENDOSCOPY;  Service: Endoscopy;  Laterality: N/A;  . fractured pelvis    . INGUINAL HERNIA REPAIR Bilateral   . LEFT HEART CATH AND CORONARY ANGIOGRAPHY N/A 08/30/2017    Procedure: LEFT HEART CATH AND CORONARY ANGIOGRAPHY;  Surgeon: Kathleene Hazel, MD;  Location: [facility] INVASIVE CV LAB;  Service: Cardiovascular;  Laterality: N/A;  . reconstruct left heel  2002  . TONSILLECTOMY     Family History  Problem Relation Age of Onset  . Diabetes Mother   . Prostate cancer Father   . Kidney failure Father   . Alcoholism Sister        history of  . Drug abuse Sister        history of   Social History   Socioeconomic History  . Marital status: Married    Spouse name: Not on file  . Number of children: 2  . Years of education: COLLEGE  . Highest education level: Not on file  Occupational History  . Occupation: pest control  Tobacco Use  . Smoking status: Former Smoker    Packs/day: 1.50    Years: 45.00    Pack years: 67.50    Types: Cigarettes    Quit date: 11/19/2015    Years since quitting: 4.5  . Smokeless tobacco: Never Used  . Tobacco comment: pt quit smoking!! 11/19/15  Vaping Use  . Vaping Use: Never used  Substance and Sexual Activity  . Alcohol use: No    Alcohol/week: 0.0 standard drinks  . Drug use: No  . Sexual activity: Not on file  Other Topics Concern  . Not on file  Social History Narrative  . Not on file   Social Determinants  of Health   Financial Resource Strain:   . Difficulty of Paying Living Expenses: Not on file  Food Insecurity:   . Worried About Programme researcher, broadcasting/film/video in the Last Year: Not on file  . Ran Out of Food in the Last Year: Not on file  Transportation Needs:   . Lack of Transportation (Medical): Not on file  . Lack of Transportation (Non-Medical): Not on file  Physical Activity:   . Days of Exercise per Week: Not on file  . Minutes of Exercise per Session: Not on file  Stress:   . Feeling of Stress : Not on file  Social Connections:   . Frequency of Communication with Friends and Family: Not on file  . Frequency of Social Gatherings with Friends and Family: Not on file  . Attends Religious  Services: Not on file  . Active Member of Clubs or Organizations: Not on file  . Attends Banker Meetings: Not on file  . Marital Status: Not on file   Allergies  Allergen Reactions  . Morphine And Related     HALLUCINATIONS    Medications  (Not in a hospital admission)    Vitals  Temp:  [98 F (36.7 C)-99.1 F (37.3 C)] 98 F (36.7 C) (09/02 0629) Pulse Rate:  [63-78] 64 (09/02 0629) Resp:  [16-18] 18 (09/02 0629) BP: (140-156)/(89-113) 145/93 (09/02 0629) SpO2:  [92 %-99 %] 94 % (09/02 0629) Weight:  [99.8 kg] 99.8 kg (09/01 1802)  Body mass index is 32.49 kg/m.  Physical Exam   General: Laying comfortably in bed; in no acute distress.  HENT: Normal oropharynx and mucosa. Normal external appearance of ears and nose. Neck: Supple, no pain or tenderness CV: No JVD. No peripheral edema. Pulmonary: Symmetric Chest rise. Normal respiratory effort. Abdomen: Soft to touch, non-tender Ext: No cyanosis, edema, or deformity  Skin: No rash. Normal palpation of skin.   Musculoskeletal: Normal digits and nails by inspection. No clubbing.  Neurologic Examination  Mental status/Cognition: Alert, oriented to self, place, month and year, good attention. Speech/language: Fluent, comprehension intact, object naming intact, repetition intact. Cranial nerves:   CN II Pupils equal and reactive to light, no VF deficits   CN III,IV,VI EOM intact, no gaze preference or deviation, no nystagmus   CN V normal sensation in V1, V2, and V3 segments bilaterally    CN VII no asymmetry, no nasolabial fold flattening    CN VIII normal hearing to speech    CN IX & X normal palatal elevation, no uvular deviation    CN XI 5/5 head turn and 5/5 shoulder shrug bilaterally    CN XII midline tongue protrusion    Motor:  Muscle bulk: normal , tone normal, pronator drift: none Mvmt Root Nerve  Muscle Right Left Comments  SA C5/6 Ax Deltoid 5 5   EF C5/6 Mc Biceps 5 5   EE C6/7/8 Rad  Triceps 5 5   WF C6/7 Med FCR 5 5   WE C7/8 PIN ECU 5 5   F Ab C8/T1 U ADM/FDI 5 5   HF L1/2/3 Fem Illopsoas 5 5   KE L2/3/4 Fem Quad 5 5   DF L4/5 D Peron Tib Ant 5 5   PF S1/2 Tibial Grc/Sol 5 5    Reflexes:  Right Left Comments  Pectoralis      Biceps (C5/6) 1 1   Brachioradialis (C5/6) 1 1    Triceps (C6/7) 1 1    Patellar (L3/4)  1 1    Achilles (S1) 1 1    Hoffman      Plantar     Jaw jerk    Sensation:  Light touch intact   Pin prick intact   Temperature intact   Vibration   Proprioception    Coordination/Complex Motor:  - Finger to Nose with tremor but no ataxia - Heel to shin no ataxia. - Rapid alternating movement normal - Gait: Deferred  Labs   Lab Results  Component Value Date   NA 139 06/01/2020   K 4.2 06/01/2020   CL 104 06/01/2020   CO2 26 06/01/2020   GLUCOSE 136 (H) 06/01/2020   BUN 12 06/01/2020   CREATININE 1.02 06/01/2020   CALCIUM 9.5 06/01/2020   ALBUMIN 3.6 06/01/2020   AST 19 06/01/2020   ALT 21 06/01/2020   ALKPHOS 103 06/01/2020   BILITOT 0.6 06/01/2020   GFRNONAA >60 06/01/2020   GFRAA >60 06/01/2020   Imaging and Diagnostic studies   MRI Brain without contrast: 1. Cluster of small acute infarctions at the left frontoparietal junction region, consistent with left MCA branch vessel insult. No large confluent infarction. No swelling or hemorrhage. 2. Mild to moderate chronic small-vessel ischemic changes of the cerebral hemispheric white matter.  Impression   Nicholas Camacho is a 69 y.o. male with PMH significant for HLD, p Afibb on Eliquis, HTN, COPD, small AAA, lung nodule who presents with 5 distinct episodes of slurred speech over the last 3 days and an episode of flushing of hir R arm. No deficit on exam. MRI Brain demonstrates  Small cluster of acute L MCA infarcts.  Recommendations  - Brain imaging- MRI Brain with a cluster of small L frontoparietal infarcts. - Vascular imaging- MRA and Carotid doppler ordered  and pending. - TTE - ordered - Lipid panel - ordered  - Statin - continue home Atorvastatin - ordered  - A1C - ordered  - continue home Eliquis - ordered - DVT ppx - SQ Heparin - SBP goal - permissive hypertension first 24 h < 220/110. Hold home meds.  - Telemetry monitoring for arrythmia- ordered - Swallow screen - ordered - Stroke education - ordered  - PT/OT/SLP consulted ______________________________________________________________________   Thank you for the opportunity to take part in the care of this patient. If you have any further questions, please contact the neurology consultation attending.  Signed,  Erick Blinks [facility] Pager Number [phone]

## 2020-06-02 NOTE — Progress Notes (Signed)
  Echocardiogram 2D Echocardiogram has been performed.  Nicholas Camacho 06/02/2020, 11:24 AM

## 2020-06-02 NOTE — ED Notes (Signed)
Patient transported to MRI 

## 2020-06-02 NOTE — Evaluation (Signed)
Physical Therapy Evaluation Patient Details Name: Nicholas Camacho MRN: [id number] DOB: 1950-12-13 Today's Date: 06/02/2020   History of Present Illness  Pt is 69 yo male with PMH including OSA, COPD, afib, and HLD.  He presented with slurred speech.  Pt has had on/off symptoms of slurred speech since MOnday night.  Also had a warm sensation down R arm that resolved quickly on Monday night.  Pt found to have Cluster of small acute infarctions at the left frontoparietal junction region, consistent with left MCA branch vessel insult.  Clinical Impression  Pt admitted with CVA.  He presents with 5/5 bil LE strength with good coordination.  Pt was able to ambulate community distances with steady gait pattern.  He scored a 21 on the DGI indicating low fall risk.  Pt did have DOE of 2/4 and mild antalgic gait but these are baseline.  He has good support at home. Does live in 2 level home but was able to do stairs.  No further skilled PT indicated.      Follow Up Recommendations No PT follow up    Equipment Recommendations  None recommended by PT    Recommendations for Other Services       Precautions / Restrictions Precautions Precautions: Fall      Mobility  Bed Mobility Overal bed mobility: Independent                Transfers Overall transfer level: Needs assistance   Transfers: Sit to/from Stand Sit to Stand: Supervision            Ambulation/Gait Ambulation/Gait assistance: Min guard;Supervision Gait Distance (Feet): 400 Feet Assistive device: None Gait Pattern/deviations: WFL(Within Functional Limits)     General Gait Details: Mild antalgic pattern on R - reports his normal due to R knee pain; min guard progressed to supervision  Stairs Stairs: Yes Stairs assistance: Supervision Stair Management: One rail Left;Step to pattern Number of Stairs: 5 General stair comments: reports does step to at home due to R knee  Wheelchair Mobility    Modified Rankin  (Stroke Patients Only)       Balance Overall balance assessment: Needs assistance   Sitting balance-Leahy Scale: Normal     Standing balance support: No upper extremity supported Standing balance-Leahy Scale: Normal                   Standardized Balance Assessment Standardized Balance Assessment : Dynamic Gait Index   Dynamic Gait Index Level Surface: Normal Change in Gait Speed: Mild Impairment Gait with Horizontal Head Turns: Normal Gait with Vertical Head Turns: Normal Gait and Pivot Turn: Normal Step Over Obstacle: Normal Step Around Obstacles: Normal Steps: Moderate Impairment Total Score: 21       Pertinent Vitals/Pain Pain Assessment: No/denies pain    Home Living Family/patient expects to be discharged to:: Private residence Living Arrangements: Spouse/significant other Available Help at Discharge: Family;Available 24 hours/day Type of Home: House Home Access: Stairs to enter Entrance Stairs-Rails: Left Entrance Stairs-Number of Steps: 2 Home Layout: Two level;Bed/bath upstairs Home Equipment: Cane - single point      Prior Function Level of Independence: Independent with assistive device(s)         Comments: Uses cane for community ambulation due to R knee OA, otherwise independent     Hand Dominance        Extremity/Trunk Assessment   Upper Extremity Assessment Upper Extremity Assessment: Overall WFL for tasks assessed (ROM WFL, MMT grossly 5/5; coordination grossly [facility])  Lower Extremity Assessment Lower Extremity Assessment: Overall WFL for tasks assessed (ROM WFL, MMT 5/5, heel/shin coordination WNL)    Cervical / Trunk Assessment Cervical / Trunk Assessment: Normal  Communication   Communication: Expressive difficulties (occasional slurred speech)  Cognition Arousal/Alertness: Awake/alert Behavior During Therapy: WFL for tasks assessed/performed Overall Cognitive Status: Within Functional Limits for tasks assessed                                  General Comments: Pt was a and O x 4; able to follow multistep commands      General Comments General comments (skin integrity, edema, etc.): Pt reports feels near baseline in regards to mobility, reports slurred speech still comes and goes.  He did have DOE of 2/4 with ambulation with sats 93% but reports this is normal due to COPD.    Exercises     Assessment/Plan    PT Assessment Patent does not need any further PT services  PT Problem List         PT Treatment Interventions      PT Goals (Current goals can be found in the Care Plan section)  Acute Rehab PT Goals Patient Stated Goal: return home PT Goal Formulation: All assessment and education complete, DC therapy Potential to Achieve Goals: Good    Frequency     Barriers to discharge        Co-evaluation               AM-PAC PT "6 Clicks" Mobility  Outcome Measure Help needed turning from your back to your side while in a flat bed without using bedrails?: None Help needed moving from lying on your back to sitting on the side of a flat bed without using bedrails?: None Help needed moving to and from a bed to a chair (including a wheelchair)?: None Help needed standing up from a chair using your arms (e.g., wheelchair or bedside chair)?: None Help needed to walk in hospital room?: None Help needed climbing 3-5 steps with a railing? : None 6 Click Score: 24    End of Session Equipment Utilized During Treatment: Gait belt Activity Tolerance: Patient tolerated treatment well Patient left: in bed;with call bell/phone within reach;with bed alarm set Nurse Communication: Mobility status      Time: 1731-1753 PT Time Calculation (min) (ACUTE ONLY): 22 min   Charges:   PT Evaluation $PT Eval Low Complexity: 1 Low          Nijae Doyel, PT Acute Rehab Services Pager [phone] [facility] [phone]    Rayetta Humphrey 06/02/2020, 6:07 PM

## 2020-06-02 NOTE — H&P (Signed)
History and Physical    Nicholas Camacho ZOX:[id number] DOB: 09/05/1951 DOA: 06/01/2020  PCP: [name], [name], MD Consultants:  Chestine Sporelark- pulmonology; Delton SeeNelson- cardiology Patient coming from:  Home - lives with wife; Jackey LogeOK: Wife, [phone]  Chief Complaint: Slurred speech  HPI: Nicholas Camacho is a 69 y.o. male with medical history significant of OSA; COPD; afib on Eliquis; and HLD presenting with slurred speech.  He reports that Monday night he was sitting on the commode about 11PM and felt a feeling or warmth down his R arm, resolved quickly.  Twice Tuesday, he had to repeat himself because his wife couldn't understand him.  He did ok that night and it was uneventful.  Wednesday AM, he drove himself to an appointment.  He did have some issue with speech in the AM and again in the afternoon, like he had marbles in his mouth.  They called Dr. Kateri PlummerMorrow for an appointment today, and his nurse called and they were concerned and encouraged him to call 911.  This AM, he had some trouble talking with his doctor but by the time she left if was normal.    ED Course:  Slurred speech, no aphasia.  MRI shows cluster of CVA territory.  Neurology consulted.  Review of Systems: As per HPI; otherwise review of systems reviewed and negative.   Ambulatory Status:  Ambulates without assistance  COVID Vaccine Status:   Complete  Past Medical History:  Diagnosis Date  . Disturbance of skin sensation 06/24/2013  . Elevated cholesterol   . History of echocardiogram    Echo 4/17: EF 60-65%, no RWMA, Gr 2 DD  . Sleep disturbance, unspecified 06/24/2013    Past Surgical History:  Procedure Laterality Date  . ESOPHAGOGASTRODUODENOSCOPY N/A 01/08/2016   Procedure: ESOPHAGOGASTRODUODENOSCOPY (EGD);  Surgeon: [name], MD;  [facility];  Service: Endoscopy;  Laterality: N/A;  . fractured pelvis    . INGUINAL HERNIA REPAIR Bilateral   . LEFT HEART CATH AND CORONARY ANGIOGRAPHY N/A 08/30/2017    Procedure: LEFT HEART CATH AND CORONARY ANGIOGRAPHY;  Surgeon: Kathleene HazelMcAlhany, Christopher D, MD;  Location: [facility] INVASIVE CV LAB;  Service: Cardiovascular;  Laterality: N/A;  . reconstruct left heel  2002  . TONSILLECTOMY      Social History   Socioeconomic History  . Marital status: Married    Spouse name: Not on file  . Number of children: 2  . Years of education: COLLEGE  . Highest education level: Not on file  Occupational History  . Occupation: retired  Tobacco Use  . Smoking status: Former Smoker    Packs/day: 1.50    Years: 45.00    Pack years: 67.50    Types: Cigarettes    Quit date: 11/19/2015    Years since quitting: 4.5  . Smokeless tobacco: Never Used  . Tobacco comment: pt quit smoking!! 11/19/15  Vaping Use  . Vaping Use: Never used  Substance and Sexual Activity  . Alcohol use: No    Alcohol/week: 0.0 standard drinks  . Drug use: No  . Sexual activity: Not on file  Other Topics Concern  . Not on file  Social History Narrative  . Not on file   Social Determinants of Health   Financial Resource Strain:   . Difficulty of Paying Living Expenses: Not on file  Food Insecurity:   . Worried About Programme researcher, broadcasting/film/videounning Out of Food in the Last Year: Not on file  . Ran Out of Food in the Last Year: Not on file  Transportation  Needs:   . Lack of Transportation (Medical): Not on file  . Lack of Transportation (Non-Medical): Not on file  Physical Activity:   . Days of Exercise per Week: Not on file  . Minutes of Exercise per Session: Not on file  Stress:   . Feeling of Stress : Not on file  Social Connections:   . Frequency of Communication with Friends and Family: Not on file  . Frequency of Social Gatherings with Friends and Family: Not on file  . Attends Religious Services: Not on file  . Active Member of Clubs or Organizations: Not on file  . Attends Banker Meetings: Not on file  . Marital Status: Not on file  Intimate Partner Violence:   . Fear of Current or  Ex-Partner: Not on file  . Emotionally Abused: Not on file  . Physically Abused: Not on file  . Sexually Abused: Not on file    Allergies  Allergen Reactions  . Morphine And Related     HALLUCINATIONS    Family History  Problem Relation Age of Onset  . Diabetes Mother   . Prostate cancer Father   . Kidney failure Father   . Alcoholism Sister        history of  . Drug abuse Sister        history of  . Stroke Neg Hx     Prior to Admission medications   Medication Sig Start Date End Date Taking? Authorizing Provider  atorvastatin (LIPITOR) 80 MG tablet TAKE 1 TABLET BY MOUTH EVERY DAY 04/26/20  Yes Lars Masson, MD  Cyanocobalamin 1000 MCG CAPS Take 1,000 mcg by mouth daily.   Yes [provider]  diltiazem (CARDIZEM CD) 120 MG 24 hr capsule Take 1 capsule (120 mg total) by mouth at bedtime. 11/23/19  Yes Lars Masson, MD  ELIQUIS 5 MG TABS tablet TAKE 1 TABLET BY MOUTH TWICE DAILY( OVERDUE FOR FOLLOW UP, NEEDS TO SCHEDULE FOR FURTHER REFILLS) Patient taking differently: Take 5 mg by mouth 2 (two) times daily.  12/31/19  Yes Lars Masson, MD  fexofenadine (ALLEGRA) 180 MG tablet Take 180 mg by mouth daily.   Yes [provider]  fluticasone (FLONASE) 50 MCG/ACT nasal spray Place 2 sprays into both nostrils 2 (two) times daily. 05/26/20  Yes Coral Ceo, NP  Fluticasone-Umeclidin-Vilant (TRELEGY ELLIPTA) 100-62.5-25 MCG/INH AEPB Inhale 1 puff into the lungs daily. 11/18/19  Yes Karie Fetch P, DO  ipratropium-albuterol (DUONEB) 0.5-2.5 (3) MG/3ML SOLN Take 3 mLs by nebulization 3 (three) times daily. 11/18/19  Yes Karie Fetch P, DO  losartan (COZAAR) 50 MG tablet TAKE 1 TABLET(50 MG) BY MOUTH DAILY Patient taking differently: Take 50 mg by mouth daily.  12/01/19  Yes Lars Masson, MD  nitroGLYCERIN (NITROSTAT) 0.4 MG SL tablet Place 1 tablet (0.4 mg total) under the tongue every 5 (five) minutes as needed for chest pain. 06/19/19 06/02/20 Yes Lars Masson, MD  VENTOLIN HFA 108 (90 Base) MCG/ACT inhaler INHALE 2 PUFFS BY MOUTH EVERY 6 HOURS AS NEEDED FOR WHEEZING OR SHORTNESS OF BREATH Patient taking differently: Inhale 2 puffs into the lungs every 6 (six) hours as needed for wheezing or shortness of breath.  05/10/20  Yes Steffanie Dunn, DO  Respiratory Therapy Supplies (FLUTTER) DEVI 1 Device by Does not apply route in the morning and at bedtime. 11/18/19   Steffanie Dunn, DO    Physical Exam: Vitals:   06/01/20 2103 06/02/20 0036  06/02/20 0423 06/02/20 0629  BP: 140/89 (!) 155/99 (!) 140/113 (!) 145/93  Pulse: 63 78 67 64  Resp: 16 18 17 18   Temp: 98.6 F (37 C) 98.4 F (36.9 C) 98.4 F (36.9 C) 98 F (36.7 C)  TempSrc: Oral Oral Oral Oral  SpO2: 93% 99% 92% 94%  Weight:      Height:         . General:  Appears calm and comfortable and is NAD . Eyes:  PERRL, EOMI, normal lids, iris . ENT:  grossly normal hearing, lips & tongue, mmm; poor dentition; right tongue deviation . Neck:  no LAD, masses or thyromegaly; no carotid bruits . Cardiovascular:  RRR, no m/r/g. No LE edema.  Respiratory:   CTA bilaterally with no wheezes/rales/rhonchi.  Normal respiratory effort. . Abdomen:  soft, NT, ND, NABS . Skin:  no rash or induration seen on limited exam . Musculoskeletal:  grossly normal tone BUE/BLE, good ROM, no bony abnormality . Psychiatric:  grossly normal mood and affect, speech fluent and appropriate but mildly dysarthric, AOx3 . Neurologic:  CN 2-12 grossly intact, moves all extremities in coordinated fashion, sensation intact    Radiological Exams on Admission: DG Chest 2 View  Result Date: 06/01/2020 CLINICAL DATA:  Shortness of breath EXAM: CHEST - 2 VIEW COMPARISON:  03/07/2018 FINDINGS: Hyperinflation with emphysematous disease. No acute opacity, pleural effusion or pneumothorax. Normal cardiomediastinal silhouette. IMPRESSION: No active cardiopulmonary disease. Hyperinflation with emphysematous disease.  Electronically Signed   By: 05/07/2018 M.D.   On: 06/01/2020 19:04   CT HEAD WO CONTRAST  Result Date: 06/01/2020 CLINICAL DATA:  Intermittent slurred speech EXAM: CT HEAD WITHOUT CONTRAST TECHNIQUE: Contiguous axial images were obtained from the base of the skull through the vertex without intravenous contrast. COMPARISON:  None. FINDINGS: Brain: There is no acute intracranial hemorrhage, mass effect, or edema. Gray-white differentiation is preserved. There is no extra-axial fluid collection. Ventricles and sulci are within normal limits in size and configuration. Patchy hypoattenuation in the supratentorial white matter is nonspecific may reflect mild chronic microvascular ischemic changes. Vascular: There is atherosclerotic calcification at the skull base. Skull: Calvarium is unremarkable. Sinuses/Orbits: Lobular mucosal thickening greatest in the maxillary sinuses. Orbits are unremarkable. Other: None. IMPRESSION: No acute intracranial abnormality. Mild chronic microvascular ischemic changes. Electronically Signed   By: 08/01/2020 M.D.   On: 06/01/2020 20:19   MR Brain Wo Contrast (neuro protocol)  Result Date: 06/02/2020 CLINICAL DATA:  Intermittent slurred speech beginning 4 days ago. EXAM: MRI HEAD WITHOUT CONTRAST TECHNIQUE: Multiplanar, multiecho pulse sequences of the brain and surrounding structures were obtained without intravenous contrast. COMPARISON:  Head CT yesterday. FINDINGS: Brain: Diffusion imaging shows a cluster of small acute infarctions at the left frontoparietal junction region. No large confluent infarction. No swelling or hemorrhage. Other region of acute insult. The brainstem and cerebellum are normal. Cerebral hemispheres otherwise show chronic small-vessel ischemic changes of the white matter of a mild-to-moderate degree. No hydrocephalus, mass or extra-axial collection. Vascular: Major vessels at the base of the brain show flow. Skull and upper cervical spine: Negative  Sinuses/Orbits: Clear/normal Other: None IMPRESSION: 1. Cluster of small acute infarctions at the left frontoparietal junction region, consistent with left MCA branch vessel insult. No large confluent infarction. No swelling or hemorrhage. 2. Mild to moderate chronic small-vessel ischemic changes of the cerebral hemispheric white matter. Electronically Signed   By: 08/02/2020 M.D.   On: 06/02/2020 09:32    EKG: Independently reviewed.  NSR with rate 67; no evidence of acute ischemia   Labs on Admission: I have personally reviewed the available labs and imaging studies at the time of the admission.  Pertinent labs:   Glucose 136 Normal CBC INR 1.1   Assessment/Plan Principal Problem:   CVA (cerebral vascular accident) (HCC) Active Problems:   COPD GOLD II B   Essential hypertension   Hyperlipidemia   Atrial fibrillation (HCC)   CAD (coronary artery disease)   Class 1 obesity due to excess calories with body mass index (BMI) of 32.0 to 32.9 in adult   CVA -Acute onset several days ago of transient RUE numbness followed by dysarthria -Concerning for CVA -MRI confirms a cluster of acute CVA in the L MCA territory -Patient acknowledges inconsistent dosing with Eliquis, missing several doses a week -Will admit for further CVA evaluation -Telemetry monitoring -Carotid dopplers -Echo -Risk stratification with FLP, A1c; will also check TSH and UDS -Resume Eliquis but take religiously -Neurology consult -PT/OT/ST/Nutrition Consults  HTN -Allow permissive HTN for now -Treat BP only if >220/120, and then with goal of 15% reduction -Hold Diltiazem, Cozaar and plan to restart in 48-72 hours   HLD -Check FLP -Continue Lipitor 80 mg daily   Afib -Currently rate controlled -Usually takes Diltiazem but holding for permissive HTN -Resume Eliquis but improve compliance  COPD  -Continue Allegra, Flonase, Trelegy Ellipta, Duoneb, Albuterol, and flutter valve -Appears compensated  at this time  Obesity -Body mass index is 32.49 kg/m. -Weight loss should be encouraged -Outpatient PCP/bariatric medicine f/u encouraged    Note: This patient has been tested and is pending for the novel coronavirus COVID-19.     DVT prophylaxis:  Eliquis  Code Status: Full - confirmed with patient Family Communication: None present Disposition Plan:  The patient is from: home  Anticipated d/c is to: home without [facility] services   Anticipated d/c date will depend on clinical response to treatment, likely 1-2 days  Patient is currently: acutely ill Consults called: Neurology; PT/OT/ST/Nutrition  Admission status: Admit - It is my clinical opinion that admission to INPATIENT is reasonable and necessary because of the expectation that this patient will require hospital care that crosses at least 2 midnights to treat this condition based on the medical complexity of the problems presented.  Given the aforementioned information, the predictability of an adverse outcome is felt to be significant.    Jonah Blue MD [facility]   How to contact the [facility] Attending or Consulting provider 7A - 7P or covering provider during after hours 7P -7A, for this patient?  1. Check the care team in [facility] and look for a) attending/consulting [facility] provider listed and b) the [facility] team listed 2. Log into [email or web address] and use [facility]'s universal password to access. If you do not have the password, please contact the hospital operator. 3. Locate the [facility] provider you are looking for under [facility] and page to a number that you can be directly reached. 4. If you still have difficulty reaching the provider, please page the [facility] (Director on Call) for the Hospitalists listed on amion for assistance.   06/02/2020, 11:14 AM

## 2020-06-03 DIAGNOSIS — I480 Paroxysmal atrial fibrillation: Secondary | ICD-10-CM | POA: Diagnosis not present

## 2020-06-03 DIAGNOSIS — I63412 Cerebral infarction due to embolism of left middle cerebral artery: Secondary | ICD-10-CM | POA: Diagnosis not present

## 2020-06-03 DIAGNOSIS — I10 Essential (primary) hypertension: Secondary | ICD-10-CM | POA: Diagnosis not present

## 2020-06-03 DIAGNOSIS — J449 Chronic obstructive pulmonary disease, unspecified: Secondary | ICD-10-CM

## 2020-06-03 DIAGNOSIS — E785 Hyperlipidemia, unspecified: Secondary | ICD-10-CM

## 2020-06-03 DIAGNOSIS — I639 Cerebral infarction, unspecified: Secondary | ICD-10-CM | POA: Diagnosis present

## 2020-06-03 LAB — HEMOGLOBIN A1C
Hgb A1c MFr Bld: 6.4 % — ABNORMAL HIGH (ref 4.8–5.6)
Mean Plasma Glucose: 136.98 mg/dL

## 2020-06-03 LAB — LIPID PANEL
Cholesterol: 88 mg/dL (ref 0–200)
HDL: 24 mg/dL — ABNORMAL LOW (ref >40)
LDL Cholesterol: 48 mg/dL (ref 0–99)
Total CHOL/HDL Ratio: 3.7 RATIO
Triglycerides: 82 mg/dL (ref <150)
VLDL: 16 mg/dL (ref 0–40)

## 2020-06-03 LAB — HIV ANTIBODY (ROUTINE TESTING W REFLEX): HIV Screen 4th Generation wRfx: NONREACTIVE

## 2020-06-03 NOTE — Evaluation (Signed)
Occupational Therapy Evaluation Patient Details Name: Nicholas Camacho MRN: [id number] DOB: Jul 05, 1951 Today's Date: 06/03/2020    History of Present Illness Pt is 69 yo male with PMH including OSA, COPD, afib, and HLD.  He presented with slurred speech.  Pt has had on/off symptoms of slurred speech since MOnday night.  Also had a warm sensation down R arm that resolved quickly on Monday night.  Pt found to have Cluster of small acute infarctions at the left frontoparietal junction region, consistent with left MCA branch vessel insult.   Clinical Impression   PTA patient independent and driving. Admitted for above and presenting at baseline independent level for ADLs, transfers and in room mobility. Patient reports using a cane a times in community, and uses IV pole in room to simulate.  Discussed safety, energy conservation (due to COPD tolerance).  Cognition WFL.  No further OT needs identified at this time.  OT will sign off.     Follow Up Recommendations  No OT follow up    Equipment Recommendations  None recommended by OT    Recommendations for Other Services Speech consult     Precautions / Restrictions Precautions Precautions: Fall Restrictions Weight Bearing Restrictions: No      Mobility Bed Mobility Overal bed mobility: Independent                Transfers Overall transfer level: Independent                    Balance Overall balance assessment: Mild deficits observed, not formally tested                                         ADL either performed or assessed with clinical judgement   ADL Overall ADL's : Modified independent                                       General ADL Comments: modified independent for simulated toilet and tub transfers, LB dressing; discussed energy conservation techniques and activity tolerance progression      Vision   Vision Assessment?: No apparent visual deficits      Perception     Praxis      Pertinent Vitals/Pain Pain Assessment: No/denies pain     Hand Dominance Right   Extremity/Trunk Assessment Upper Extremity Assessment Upper Extremity Assessment: Overall WFL for tasks assessed   Lower Extremity Assessment Lower Extremity Assessment: Defer to PT evaluation   Cervical / Trunk Assessment Cervical / Trunk Assessment: Normal   Communication Communication Communication: No difficulties (pt reports slurred speech at times )   Cognition Arousal/Alertness: Awake/alert Behavior During Therapy: WFL for tasks assessed/performed Overall Cognitive Status: Within Functional Limits for tasks assessed                                 General Comments: pt following multistep commands, good safety awareness, short blessed test reveals 0/28 normal cog   General Comments  pt writing WFL, spouse present at end of session and supportive     Exercises     Shoulder Instructions      Home Living Family/patient expects to be discharged to:: Private residence Living Arrangements: Spouse/significant other Available Help at Discharge: Family;Available 24  hours/day Type of Home: House Home Access: Stairs to enter [employer] of Steps: 2 Entrance Stairs-Rails: Left Home Layout: Two level;Bed/bath upstairs Alternate Level Stairs-Number of Steps: flight Alternate Level Stairs-Rails: Left Bathroom Shower/Tub: Walk-in shower;Tub only   Bathroom Toilet: Standard     Home Equipment: Cane - single point          Prior Functioning/Environment Level of Independence: Independent with assistive device(s)        Comments: Uses cane for community ambulation due to R knee OA, otherwise independent        OT Problem List:        OT Treatment/Interventions:      OT Goals(Current goals can be found in the care plan section) Acute Rehab OT Goals Patient Stated Goal: return home OT Goal Formulation: With patient  OT  Frequency:     Barriers to D/C:            Co-evaluation              AM-PAC OT "6 Clicks" Daily Activity     Outcome Measure Help from another person eating meals?: None Help from another person taking care of personal grooming?: None Help from another person toileting, which includes using toliet, bedpan, or urinal?: None Help from another person bathing (including washing, rinsing, drying)?: None Help from another person to put on and taking off regular upper body clothing?: None Help from another person to put on and taking off regular lower body clothing?: None 6 Click Score: 24   End of Session Nurse Communication: Mobility status  Activity Tolerance: Patient tolerated treatment well Patient left: with call bell/phone within reach;Other (comment) (seated EOB )  OT Visit Diagnosis: Other abnormalities of gait and mobility (R26.89)                Time: 2947-6546 OT Time Calculation (min): 17 min Charges:  OT General Charges $OT Visit: 1 Visit OT Evaluation $OT Eval Low Complexity: 1 Low  Barry Brunner, OT Acute Rehabilitation Services Pager [phone] Office [phone]   Chancy Milroy 06/03/2020, 8:57 AM

## 2020-06-03 NOTE — Progress Notes (Signed)
SLP Cancellation Note  Patient Details Name: Nicholas Camacho FULL MRN: [id number] DOB: [birth year]   Cancelled:        Pt's communication screened - he has returned to baseline. Reviewed BE-FAST acronym with pt and his wife. No formal SLP assessment is warranted.  Pt is D/Cing home today. No SLP f/u.  Lea Baine L. Samson Frederic, MA CCC/SLP Acute Rehabilitation Services Office number [phone] Pager [phone]    Blenda Mounts Laurice 06/03/2020, 10:45 AM

## 2020-06-03 NOTE — Discharge Instructions (Addendum)
 Hospital Discharge After a Stroke  Being discharged from the hospital after a stroke can feel overwhelming. Many things may be different, and it is normal to feel scared or anxious. Some stroke survivors may be able to return to their homes, and others may need more specialized care on a temporary or permanent basis. Your stroke care team will work with you to develop a discharge plan that is best for you. Ask questions if you do not understand something. Invite a friend or family member to participate in discharge planning. Understanding and following your discharge plan can help to prevent another stroke or other problems. Understanding your medicines After a stroke, your health care provider may prescribe one or more types of medicine. It is important to take medicines exactly as told by your health care provider. Serious harm, such as another stroke, can happen if you are unable to take your medicine exactly as prescribed. Make sure you understand:  What medicine to take.  Why you are taking the medicine.  How and when to take it.  If it can be taken with your other medicines and herbal supplements.  Possible side effects.  When to call your health care provider if you have any side effects.  How you will get and pay for your medicines. Medical assistance programs may be able to help you pay for prescription medicines if you cannot afford them. If you are taking an anticoagulant, be sure to take it exactly as told by your health care provider. This type of medicine can increase the risk of bleeding because it works to prevent blood from clotting. You may need to take certain precautions to prevent bleeding. You should contact your health care provider if you have:  Bleeding or bruising.  A fall or other injury to your head.  Blood in your urine or stool (feces). Planning for home safety  Take steps to prevent falls, such as installing grab bars or using a shower chair. Ask a  friend or family member to get needed things in place before you go home if possible. A therapist can come to your home to make recommendations for safety equipment. Ask your health care provider if you would benefit from this service or from home care. Getting needed equipment Ask your health care provider for a list of any medical equipment and supplies you will need at home. These may include items such as:  Walkers.  Canes.  Wheelchairs.  Hand-strengthening devices.  Special eating utensils. Medical equipment can be rented or purchased, depending on your insurance coverage. Check with your insurance company about what is covered. Keeping follow-up visits After a stroke, you will need to follow up regularly with a health care provider. You may also need rehabilitation, which can include physical therapy, occupational therapy, or speech-language therapy. Keeping these appointments is very important to your recovery after a stroke. Be sure to bring your medicine list and discharge papers with you to your appointments. If you need help to keep track of your schedule, use a calendar or appointment reminder. Preventing another stroke Having a stroke puts you at risk for another stroke in the future. Ask your health care provider what actions you can take to lower the risk. These may include:  Increasing how much you exercise.  Making a healthy eating plan.  Quitting smoking.  Managing other health conditions, such as high blood pressure, high cholesterol, or diabetes.  Limiting alcohol use. Knowing the warning signs of a stroke  Make   sure you understand the signs of a stroke. Before you leave the hospital, you will receive information outlining the stroke warning signs. Share these with your friends and family members. "BE FAST" is an easy way to remember the main warning signs of a stroke:  B - Balance. Signs are dizziness, sudden trouble walking, or loss of balance.  E - Eyes.  Signs are trouble seeing or a sudden change in vision.  F - Face. Signs are sudden weakness or numbness of the face, or the face or eyelid drooping on one side.  A - Arms. Signs are weakness or numbness in an arm. This happens suddenly and usually on one side of the body.  S - Speech. Signs are sudden trouble speaking, slurred speech, or trouble understanding what people say.  T - Time. Time to call emergency services. Write down what time symptoms started. Other signs of stroke may include:  A sudden, severe headache with no known cause.  Nausea or vomiting.  Seizure. These symptoms may represent a serious problem that is an emergency. Do not wait to see if the symptoms will go away. Get medical help right away. Call your local emergency services (911 in the [place] Do not drive yourself to the hospital. Make note of the time that you had your first symptoms. Your emergency responders or emergency room staff will need to know this information. Summary  Being discharged from the hospital after a stroke can feel overwhelming. It is normal to feel scared or anxious.  Make sure you take medicines exactly as told by your health care provider.  Know the warning signs of a stroke, and get help right way if you have any of these symptoms. "BE FAST" is an easy way to remember the main warning signs of a stroke. This information is not intended to replace advice given to you by your health care provider. Make sure you discuss any questions you have with your health care provider. Document Revised: 06/10/2019 Document Reviewed: 12/21/2016 Elsevier Patient Education  2020 [employer] Information on my medicine - ELIQUIS (apixaban)  This medication education was reviewed with me or my healthcare representative as part of my discharge preparation.    Why was Eliquis prescribed for you? Eliquis was prescribed for you to reduce the risk of a blood clot forming that can cause a stroke if you  have a medical condition called atrial fibrillation (a type of irregular heartbeat).  What do You need to know about Eliquis ? Take your Eliquis TWICE DAILY - one tablet in the morning and one tablet in the evening with or without food. If you have difficulty swallowing the tablet whole please discuss with your pharmacist how to take the medication safely.  Take Eliquis exactly as prescribed by your doctor and DO NOT stop taking Eliquis without talking to the doctor who prescribed the medication.  Stopping may increase your risk of developing a stroke.  Refill your prescription before you run out.  After discharge, you should have regular check-up appointments with your healthcare provider that is prescribing your Eliquis.  In the future your dose may need to be changed if your kidney function or weight changes by a significant amount or as you get older.  What do you do if you miss a dose? If you miss a dose, take it as soon as you remember on the same day and resume taking twice daily.  Do not take more than one dose of ELIQUIS at the same   time to make up a missed dose.  Important Safety Information A possible side effect of Eliquis is bleeding. You should call your healthcare provider right away if you experience any of the following: ? Bleeding from an injury or your nose that does not stop. ? Unusual colored urine (red or dark brown) or unusual colored stools (red or black). ? Unusual bruising for unknown reasons. ? A serious fall or if you hit your head (even if there is no bleeding).  Some medicines may interact with Eliquis and might increase your risk of bleeding or clotting while on Eliquis. To help avoid this, consult your healthcare provider or pharmacist prior to using any new prescription or non-prescription medications, including herbals, vitamins, non-steroidal anti-inflammatory drugs (NSAIDs) and supplements.  This website has more information on Eliquis (apixaban):  http://www.eliquis.com/eliquis/home 

## 2020-06-03 NOTE — Care Management Obs Status (Signed)
MEDICARE OBSERVATION STATUS NOTIFICATION   Patient Details  Name: Nicholas Camacho MRN: [id number] Date of Birth: Jul 30, 1951   Medicare Observation Status Notification Given:  Yes    Baldemar Lenis, LCSW 06/03/2020, 10:47 AM

## 2020-06-03 NOTE — Progress Notes (Signed)
Refer to dc summary from today

## 2020-06-03 NOTE — Progress Notes (Signed)
Nutrition Brief Note  RD consulted for assessment of nutrition requirements/ status.   Wt Readings from Last 15 Encounters:  06/01/20 99.8 kg  03/09/20 100.9 kg  12/03/19 100.4 kg  11/23/19 100.9 kg  11/18/19 99.8 kg  06/19/19 98.4 kg  07/22/18 101.2 kg  04/21/18 94.7 kg  03/07/18 91.6 kg  12/23/17 92 kg  09/17/17 92.4 kg  08/30/17 93 kg  08/27/17 93 kg  07/25/17 92.5 kg  05/28/17 91.4 kg   Nicholas Camacho is a 69 y.o. male with medical history significant of OSA; COPD; afib on Eliquis; and HLD presenting with slurred speech.  Pt admitted with CVA.   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   Spoke with pt and wife at bedside; both pleasant and in good spirits today. Pt reports he always has a good appetite- he typically consumes 2 meals per day (meat, starch, and vegetable) in the late morning/early afternoon and dinner. He also has a afternoon snack (chips and guacamole) and HS snack (chocolate or oreos). He denies any difficulty chewing or swallowing. Pt wife reports he really likes the hospital food and consumed all of dinner and breakfast.   Pt denies any weight loss. Per wt hx; wt has been stable over the past year.   Nutrition-Focused physical exam completed. Findings are no fat depletion, no muscle depletion, and no edema.   Pt and wife deny any nutrition concerns of questions, but express appreciation for visit. Pt hopeful for discharge later on today.   Medications reviewed and 0.9% sodium chloride infusion @ 50 ml/hr.   Labs reviewed.   Body mass index is 32.49 kg/m. Patient meets criteria for obesity, class I based on current BMI. Obesity is a complex, chronic medical condition that is optimally managed by a multidisciplinary care team. Weight loss is not an ideal goal for an acute inpatient hospitalization. However, if further work-up for obesity is  warranted, consider outpatient referral to outpatient bariatric service and/or [facility].   Current diet order is Heart Healthy, patient is consuming approximately 100% of meals at this time. Labs and medications reviewed.   No nutrition interventions warranted at this time. If nutrition issues arise, please consult RD.   Levada Schilling, RD, LDN, CDCES Registered Dietitian II Certified Diabetes Care and Education Specialist Please refer to [facility] for RD and/or RD on-call/weekend/after hours pager

## 2020-06-03 NOTE — Care Management CC44 (Signed)
Condition Code 44 Documentation Completed  Patient Details  Name: Nicholas Camacho MRN: [id number] Date of Birth: [birth year]   Condition Code 44 given:  Yes Patient signature on Condition Code 44 notice:  Yes Documentation of 2 MD's agreement:  Yes Code 44 added to claim:  Yes    Baldemar Lenis, LCSW 06/03/2020, 10:47 AM

## 2020-06-03 NOTE — Progress Notes (Addendum)
STROKE TEAM PROGRESS NOTE   INTERVAL HISTORY His  wife is at the bedside.  Patient in bed. Discussed with wife and patient the need to be compliant with eliquis. Both verbalized an understanding.  I personally reviewed history of presenting illness, electronic medical records and imaging films in PACS.  He presented with recurrent transient episodes of expressive speech difficulties.  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  MRI of the brain is unremarkable.  Carotid ultrasound shows no significant extracranial stenosis and 2D echo is unremarkable.  Vitals:   06/03/20 0333 06/03/20 0718 06/03/20 0912 06/03/20 1020  BP: (!) 160/103 (!) 145/92  126/81  Pulse: 68 69  70  Resp: 15 18  18   Temp: 98.6 F (37 C) 98.7 F (37.1 C)  99 F (37.2 C)  TempSrc:  Oral  Oral  SpO2: 91% 91% 93% 90%  Weight:      Height:       CBC:  Recent Labs  Lab 06/01/20 1807  WBC 8.6  NEUTROABS 5.8  HGB 14.2  HCT 46.3  MCV 88.7  PLT 265   Basic Metabolic Panel:  Recent Labs  Lab 06/01/20 1807  NA 139  K 4.2  CL 104  CO2 26  GLUCOSE 136*  BUN 12  CREATININE 1.02  CALCIUM 9.5   IMAGING past 24 hours MR ANGIO HEAD WO CONTRAST  Result Date: 06/02/2020 CLINICAL DATA:  Acute left MCA territory infarctions. EXAM: MRA HEAD WITHOUT CONTRAST TECHNIQUE: Angiographic images of the Circle of Willis were obtained using MRA technique without intravenous contrast. COMPARISON:  MRI same day. FINDINGS: Both internal carotid arteries are widely patent into the brain. No siphon stenosis. The anterior and middle cerebral vessels are patent without proximal stenosis, aneurysm or vascular malformation. No discernible large or medium vessel occlusion in the region of the acute infarctions at the left frontoparietal junction. Both vertebral arteries are widely patent to the basilar. No basilar stenosis. Posterior circulation branch vessels appear normal. IMPRESSION: Normal intracranial MR angiography  of the large and medium size vessels. No evidence of large or medium vessel occlusion or correctable proximal stenosis at this time. Electronically Signed   By: 08/02/2020 M.D.   On: 06/02/2020 11:27   ECHOCARDIOGRAM COMPLETE  Result Date: 06/02/2020    ECHOCARDIOGRAM REPORT   Patient Name:   Nicholas Camacho Date of Exam: 06/02/2020 Medical Rec #:  08/02/2020           Height:       69.0 in Accession #:    [id number]          Weight:       220.0 lb Date of Birth:  1951/04/10            BSA:          2.151 m Patient Age:    69 years            BP:           145/93 mmHg Patient Gender: M                   HR:           69 bpm. Exam Location:  Inpatient Procedure: 2D Echo, Cardiac Doppler and Color Doppler Indications:    Stroke 434.91  History:        Patient has prior history of Echocardiogram examinations, most  recent 11/26/2019. CAD, COPD and TIA, Arrythmias:Atrial                 Fibrillation, Signs/Symptoms:Chest Pain; Risk                 Factors:Hypertension, Dyslipidemia and Former Smoker.  Sonographer:    Renella Cunas RDCS Referring Phys: [id number] [facility] IMPRESSIONS  1. Left ventricular ejection fraction, by estimation, is 55 to 60%. The left ventricle has normal function. The left ventricle has no regional wall motion abnormalities. Left ventricular diastolic parameters are consistent with Grade I diastolic dysfunction (impaired relaxation).  2. Right ventricular systolic function is normal. The right ventricular size is normal.  3. The mitral valve is normal in structure. Trivial mitral valve regurgitation. No evidence of mitral stenosis.  4. The aortic valve was not well visualized. Aortic valve regurgitation is mild. Mild aortic valve sclerosis is present, with no evidence of aortic valve stenosis.  5. The inferior vena cava is normal in size with greater than 50% respiratory variability, suggesting right atrial pressure of 3 mmHg. FINDINGS  Left Ventricle: Left ventricular  ejection fraction, by estimation, is 55 to 60%. The left ventricle has normal function. The left ventricle has no regional wall motion abnormalities. The left ventricular internal cavity size was normal in size. There is  no left ventricular hypertrophy. Left ventricular diastolic parameters are consistent with Grade I diastolic dysfunction (impaired relaxation). Right Ventricle: The right ventricular size is normal. No increase in right ventricular wall thickness. Right ventricular systolic function is normal. Left Atrium: Left atrial size was normal in size. Right Atrium: Right atrial size was normal in size. Pericardium: There is no evidence of pericardial effusion. Mitral Valve: The mitral valve is normal in structure. Normal mobility of the mitral valve leaflets. Trivial mitral valve regurgitation. No evidence of mitral valve stenosis. Tricuspid Valve: The tricuspid valve is normal in structure. Tricuspid valve regurgitation is mild . No evidence of tricuspid stenosis. Aortic Valve: The aortic valve was not well visualized. Aortic valve regurgitation is mild. Mild aortic valve sclerosis is present, with no evidence of aortic valve stenosis. Pulmonic Valve: The pulmonic valve was normal in structure. Pulmonic valve regurgitation is not visualized. No evidence of pulmonic stenosis. Aorta: The aortic root is normal in size and structure. Venous: The inferior vena cava is normal in size with greater than 50% respiratory variability, suggesting right atrial pressure of 3 mmHg. IAS/Shunts: The interatrial septum was not well visualized.  LEFT VENTRICLE PLAX 2D LVIDd:         5.50 cm LVIDs:         4.20 cm LV PW:         0.70 cm LV IVS:        0.70 cm LVOT diam:     2.20 cm LV SV:         84 LV SV Index:   39 LVOT Area:     3.80 cm  LV Volumes (MOD) LV vol d, MOD A2C: 118.0 ml LV vol d, MOD A4C: 113.0 ml LV vol s, MOD A2C: 54.3 ml LV vol s, MOD A4C: 56.9 ml LV SV MOD A2C:     63.7 ml LV SV MOD A4C:     113.0 ml LV SV  MOD BP:      57.5 ml RIGHT VENTRICLE TAPSE (M-mode): 2.1 cm LEFT ATRIUM             Index       RIGHT ATRIUM  Index LA Vol (A2C):   32.7 ml 15.20 ml/m RA Area:     15.40 cm LA Vol (A4C):   25.2 ml 11.71 ml/m RA Volume:   41.60 ml  19.34 ml/m LA Biplane Vol: 29.8 ml 13.85 ml/m  AORTIC VALVE LVOT Vmax:   109.00 cm/s LVOT Vmean:  75.000 cm/s LVOT VTI:    0.220 m  AORTA Ao Root diam: 3.80 cm  SHUNTS Systemic VTI:  0.22 m Systemic Diam: 2.20 cm Charlton HawsPeter Nishan MD Electronically signed by Charlton HawsPeter Nishan MD Signature Date/Time: 06/02/2020/12:51:33 PM    Final    V[facility] US CAROTID (at [facility] and [facility] only)  Result Date: 06/02/2020 Carotid Arterial Duplex Study Indications:  Speech disturbance and Syncope. Risk Factors: Hypertension. Performing Technologist: Jannet AskewVernon Matacale RCT RDMS  Examination Guidelines: A complete evaluation includes B-mode imaging, spectral Doppler, color Doppler, and power Doppler as needed of all accessible portions of each vessel. Bilateral testing is considered an integral part of a complete examination. Limited examinations for reoccurring indications may be performed as noted.  Right Carotid Findings: +----------+--------+--------+--------+------------------+------------------+           PSV cm/sEDV cm/sStenosisPlaque DescriptionComments           +----------+--------+--------+--------+------------------+------------------+ CCA Prox  97      20                                                   +----------+--------+--------+--------+------------------+------------------+ CCA Distal85      19                                intimal thickening +----------+--------+--------+--------+------------------+------------------+ ICA Prox  70      26      1-39%                     intimal thickening +----------+--------+--------+--------+------------------+------------------+ ICA Distal85      27                                                    +----------+--------+--------+--------+------------------+------------------+ ECA       92      20                                                   +----------+--------+--------+--------+------------------+------------------+ +----------+--------+-------+--------+-------------------+           PSV cm/sEDV cmsDescribeArm Pressure (mmHG) +----------+--------+-------+--------+-------------------+ [id number]     3                                  +----------+--------+-------+--------+-------------------+ +---------+--------+--+--------+--+---------+ VertebralPSV cm/s68EDV cm/s19Antegrade +---------+--------+--+--------+--+---------+  Left Carotid Findings: +----------+--------+--------+--------+------------------+------------------+           PSV cm/sEDV cm/sStenosisPlaque DescriptionComments           +----------+--------+--------+--------+------------------+------------------+ CCA Prox  93      27                                                   +----------+--------+--------+--------+------------------+------------------+  CCA Distal95      29                                intimal thickening +----------+--------+--------+--------+------------------+------------------+ ICA Prox  88      22      1-39%                     intimal thickening +----------+--------+--------+--------+------------------+------------------+ ICA Distal72      27                                                   +----------+--------+--------+--------+------------------+------------------+ ECA       97      20                                                   +----------+--------+--------+--------+------------------+------------------+ +----------+--------+--------+--------+-------------------+           PSV cm/sEDV cm/sDescribeArm Pressure (mmHG) +----------+--------+--------+--------+-------------------+ [id number]     1                                    +----------+--------+--------+--------+-------------------+ +---------+--------+--+--------+--+---------+ VertebralPSV cm/s67EDV cm/s20Antegrade +---------+--------+--+--------+--+---------+   Summary: Right Carotid: Velocities in the right ICA are consistent with a 1-39% stenosis. Left Carotid: Velocities in the left ICA are consistent with a 1-39% stenosis. Vertebrals: Bilateral vertebral arteries demonstrate antegrade flow. *See table(s) above for measurements and observations.  Electronically signed by Delia Heady MD on 06/02/2020 at 12:39:33 PM.    Final     PHYSICAL EXAM Pleasant middle-aged Caucasian male not in distress. . Afebrile. Head is nontraumatic. Neck is supple without bruit.    Cardiac exam no murmur or gallop. Lungs are clear to auscultation. Distal pulses are well felt. Neurological Exam ;  Awake  Alert oriented x 3. Normal speech and language.eye movements full without nystagmus.fundi were not visualized. Vision acuity and fields appear normal. Hearing is normal. Palatal movements are normal. Face symmetric. Tongue midline. Normal strength, tone, reflexes and coordination. Normal sensation. Gait deferred.  ASSESSMENT/PLAN [name] is a 69 y.o.  male with PMH significant for HLD, p Afibb on Eliquis, HTN, COPD, small AAA, lung nodule who presents with intermittent slurring of his speech. He has had 5 episodes over the last 3 days. The first one happened Tuesday morning. In addition, he also had an episode of flushing down his R arm on Monday night. 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   CT head No acute abnormality.    MRI  1. Cluster of small acute infarctions at the left frontoparietal junction region, consistent with left MCA branch vessel insult. No large confluent infarction. No swelling or hemorrhage. 2. Mild to moderate chronic small-vessel ischemic changes of the cerebral hemispheric white  matter.  MRA  Normal intracranial MR angiography of the large and medium size  vessels  Carotid Doppler   Right Carotid: Velocities in the right ICA are consistent with a 1-39%  stenosis.  Left Carotid: Velocities in the left ICA are consistent with a 1-39%  stenosis.  Vertebrals: Bilateral vertebral arteries demonstrate antegrade flow.   2D Echo Left Ventricle: Left ventricular ejection fraction, by estimation, is 55  to 60%. The left ventricle has normal function. The left ventricle has no  regional wall motion abnormalities. The left ventricular internal cavity  size was normal in size. There is  no left ventricular hypertrophy. Left ventricular diastolic parameters  are consistent with Grade I diastolic dysfunction (impaired relaxation).   Right Ventricle: The right ventricular size is normal. No increase in  right ventricular wall thickness. Right ventricular systolic function is  normal.   LDL 41  HgbA1c No results found for requested labs within last 03212 hours.  VTE prophylaxis - Eliquis    Diet   Diet Heart Room service appropriate? Yes; Fluid consistency: Thin     Eliquis (apixaban) daily prior to admission, now on Eliquis (apixaban) daily.   Therapy recommendations:  none  Disposition:  Home  Hypertension  Home meds:  Losartan 50  Stable . Permissive hypertension (OK if < 220/120) but gradually normalize in 5-7 days . Long-term BP goal normotensive  Hyperlipidemia  Home meds:  Atorvastatin 80 mg, resumed in hospital  LDL pending, goal < 70  Diabetes type II Controlled  Home meds:  none  HgbA1c pending goal < 7.0  Other Stroke Risk Factors  Obesity, Body mass index is 32.49 kg/m., recommend weight loss, diet and exercise as appropriate   Other Active Problems  Atrial fibrillation: re-start eliquis  Hospital day # 1  Valentina Lucks, MSN, NP-C [facility] [phone] I have personally obtained history,examined this  patient, reviewed notes, independently viewed imaging studies, participated in medical decision making and plan of care.ROS completed by me personally and pertinent positives fully documented  I have made any additions or clarifications directly to the above note. Agree with note above.  He presented with recurrent transient episodes of expressive aphasia due to embolic left MCA branch infarct from atrial fibrillation and he was likely noncompliant with his Eliquis.  I had a long discussion with the patient and wife and encouraged him to be compliant with his medications.  Maintain aggressive risk factor modification.  Greater than 50% time during this 25-minute visit was spent in counseling and coordination of care and answering questions.  Delia Heady, MD Medical Director [facility] Pager: [phone] 06/03/2020 3:16 PM   To contact Stroke Continuity provider, please refer to [email or web address]. After hours, contact General Neurology

## 2020-06-03 NOTE — Discharge Summary (Signed)
Physician Discharge Summary  Nicholas Camacho JIR:[id number] DOB: 1951/04/11 DOA: 06/01/2020  PCP: Farris Has, MD  Admit date: 06/01/2020 Discharge date: 06/03/2020  Admitted From: Home Disposition:  Home  Recommendations for Outpatient Follow-up:  1. Follow up with PCP in 1-2 weeks 2. Please obtain BMP/CBC in one week your next doctors visit.  3. Advised to remain compliant with home Eliquis 4. Follow-up outpatient neurology in 3 weeks   Discharge Condition: Stable CODE STATUS: Full Diet recommendation: Heart healthy diet  Brief/Interim Summary: 69 year old with history of OSA, COPD, A. fib on Eliquis, HLD presented with slurred speech and right arm weakness.  Her symptoms are somewhat intermittent prior to admission went to go see his PCP who sent him to the hospital.  MRI showed acute CVA.  Suspicion patient had a stroke secondary to Eliquis noncompliance.  Rest of the work-up as below. Patient's wife at bedside during my evaluation on the day of discharge, he appears to be at baseline and no complaints.   Assessment & Plan:   Principal Problem:   CVA (cerebral vascular accident) (HCC) Active Problems:   COPD GOLD II B   Essential hypertension   Hyperlipidemia   Atrial fibrillation (HCC)   CAD (coronary artery disease)   Class 1 obesity due to excess calories with body mass index (BMI) of 32.0 to 32.9 in adult  Acute CVA in the left MCA territory with dysarthria, symptoms have resolved --MRI brain-acute CVA in MCA territory -MRA brain-negative for any large or medium vessel occlusion, carotid Dopplers-bilateral 1/39% stenosis -CT head-negative -Echocardiogram-EF 55 to 60%, grade 1 diastolic dysfunction -A1c-, lipid panel- -TSH-normal -Neurology consulted -Already on Eliquis, advised to remain compliant with this -PT-no follow-up needed  Essential hypertension -Advised to resume home blood pressure medicine over next 3-5 days.  Chronic atrial fibrillation,  rate controlled -Resume Eliquis.    History of COPD -Bronchodilators, supportive care  Hyperlipidemia -Lipitor 80 mg daily  Nonobstructive coronary artery disease -Currently chest pain-free.  Lung nodule -Supposed to get repeat outpatient CT later this month  Ascending aortic aneurysm -Follows outpatient cardiology.  Diagnosed in February 2021, repeat annually.    Body mass index is 32.49 kg/m.         Discharge Diagnoses:  Principal Problem:   CVA (cerebral vascular accident) (HCC) Active Problems:   COPD GOLD II B   Essential hypertension   Hyperlipidemia   Atrial fibrillation (HCC)   CAD (coronary artery disease)   Class 1 obesity due to excess calories with body mass index (BMI) of 32.0 to 32.9 in adult   Acute CVA (cerebrovascular accident) [facility])      Consultations:  Neurology  Subjective: Feels great no complaints.  Almost feels back to baseline.  Admits of medication noncompliance including Eliquis.  Wife is present at the bedside.  He is also not compliant with his diet.  Discharge Exam: Vitals:   06/03/20 0912 06/03/20 1020  BP:  126/81  Pulse:  70  Resp:  18  Temp:  99 F (37.2 C)  SpO2: 93% 90%   Vitals:   06/03/20 0333 06/03/20 0718 06/03/20 0912 06/03/20 1020  BP: (!) 160/103 (!) 145/92  126/81  Pulse: 68 69  70  Resp: 15 18  18   Temp: 98.6 F (37 C) 98.7 F (37.1 C)  99 F (37.2 C)  TempSrc:  Oral  Oral  SpO2: 91% 91% 93% 90%  Weight:      Height:        General: Pt  is alert, awake, not in acute distress Cardiovascular: RRR, S1/S2 +, no rubs, no gallops Respiratory: CTA bilaterally, no wheezing, no rhonchi Abdominal: Soft, NT, ND, bowel sounds + Extremities: no edema, no cyanosis  Discharge Instructions   Allergies as of 06/03/2020      Reactions   Morphine And Related    HALLUCINATIONS      Medication List    TAKE these medications   atorvastatin 80 MG tablet Commonly known as: LIPITOR TAKE 1 TABLET  BY MOUTH EVERY DAY   Cyanocobalamin 1000 MCG Caps Take 1,000 mcg by mouth daily.   diltiazem 120 MG 24 hr capsule Commonly known as: Cardizem CD Take 1 capsule (120 mg total) by mouth at bedtime.   Eliquis 5 MG Tabs tablet Generic drug: apixaban TAKE 1 TABLET BY MOUTH TWICE DAILY( OVERDUE FOR FOLLOW UP, NEEDS TO SCHEDULE FOR FURTHER REFILLS) What changed: See the new instructions.   fexofenadine 180 MG tablet Commonly known as: ALLEGRA Take 180 mg by mouth daily.   fluticasone 50 MCG/ACT nasal spray Commonly known as: FLONASE Place 2 sprays into both nostrils 2 (two) times daily.   Flutter Devi 1 Device by Does not apply route in the morning and at bedtime.   ipratropium-albuterol 0.5-2.5 (3) MG/3ML Soln Commonly known as: DUONEB Take 3 mLs by nebulization 3 (three) times daily.   losartan 50 MG tablet Commonly known as: COZAAR TAKE 1 TABLET(50 MG) BY MOUTH DAILY What changed: See the new instructions.   nitroGLYCERIN 0.4 MG SL tablet Commonly known as: NITROSTAT Place 1 tablet (0.4 mg total) under the tongue every 5 (five) minutes as needed for chest pain.   Trelegy Ellipta 100-62.5-25 MCG/INH Aepb Generic drug: Fluticasone-Umeclidin-Vilant Inhale 1 puff into the lungs daily.   Ventolin HFA 108 (90 Base) MCG/ACT inhaler Generic drug: albuterol INHALE 2 PUFFS BY MOUTH EVERY 6 HOURS AS NEEDED FOR WHEEZING OR SHORTNESS OF BREATH What changed: See the new instructions.       Follow-up Information    Farris Has, MD. Schedule an appointment as soon as possible for a visit in 1 week(s).   Specialty: Family Medicine Contact information: [address]  [address] [place] [address] [phone]        Julio Sicks, NP .   Specialty: Pulmonary Disease Contact information: [address] [place] [address] [phone]        Lars Masson, MD .   Specialty: Cardiology Contact information: [address] [place] [address] [phone]        Micki Riley, MD. Schedule an appointment as soon as possible for a visit in 3 week(s).   Specialties: Neurology, Radiology Contact information: [address] [place] [address] [phone]              Allergies  Allergen Reactions  . Morphine And Related     HALLUCINATIONS    You were cared for by a hospitalist during your hospital stay. If you have any questions about your discharge medications or the care you received while you were in the hospital after you are discharged, you can call the unit and asked to speak with the hospitalist on call if the hospitalist that took care of you is not available. Once you are discharged, your primary care physician will handle any further medical issues. Please note that no refills for any discharge medications will be authorized once you are discharged, as it is  imperative that you return to your primary care physician (or establish a relationship with a primary care physician if you do not have one) for your aftercare needs so that they can reassess your need for medications and monitor your lab values.   Procedures/Studies: DG Chest 2 View  Result Date: 06/01/2020 CLINICAL DATA:  Shortness of breath EXAM: CHEST - 2 VIEW COMPARISON:  03/07/2018 FINDINGS: Hyperinflation with emphysematous disease. No acute opacity, pleural effusion or pneumothorax. Normal cardiomediastinal silhouette. IMPRESSION: No active cardiopulmonary disease. Hyperinflation with emphysematous disease. Electronically Signed   By: Jasmine Pang M.D.   On: 06/01/2020 19:04   CT HEAD WO CONTRAST  Result Date: 06/01/2020 CLINICAL DATA:  Intermittent slurred speech EXAM: CT HEAD WITHOUT CONTRAST TECHNIQUE: Contiguous axial images were obtained from the base of the skull through the vertex without intravenous contrast. COMPARISON:  None. FINDINGS: Brain: There is no acute intracranial hemorrhage, mass  effect, or edema. Gray-white differentiation is preserved. There is no extra-axial fluid collection. Ventricles and sulci are within normal limits in size and configuration. Patchy hypoattenuation in the supratentorial white matter is nonspecific may reflect mild chronic microvascular ischemic changes. Vascular: There is atherosclerotic calcification at the skull base. Skull: Calvarium is unremarkable. Sinuses/Orbits: Lobular mucosal thickening greatest in the maxillary sinuses. Orbits are unremarkable. Other: None. IMPRESSION: No acute intracranial abnormality. Mild chronic microvascular ischemic changes. Electronically Signed   By: Guadlupe Spanish M.D.   On: 06/01/2020 20:19   MR ANGIO HEAD WO CONTRAST  Result Date: 06/02/2020 CLINICAL DATA:  Acute left MCA territory infarctions. EXAM: MRA HEAD WITHOUT CONTRAST TECHNIQUE: Angiographic images of the Circle of Willis were obtained using MRA technique without intravenous contrast. COMPARISON:  MRI same day. FINDINGS: Both internal carotid arteries are widely patent into the brain. No siphon stenosis. The anterior and middle cerebral vessels are patent without proximal stenosis, aneurysm or vascular malformation. No discernible large or medium vessel occlusion in the region of the acute infarctions at the left frontoparietal junction. Both vertebral arteries are widely patent to the basilar. No basilar stenosis. Posterior circulation branch vessels appear normal. IMPRESSION: Normal intracranial MR angiography of the large and medium size vessels. No evidence of large or medium vessel occlusion or correctable proximal stenosis at this time. Electronically Signed   By: Paulina Fusi M.D.   On: 06/02/2020 11:27   MR Brain Wo Contrast (neuro protocol)  Result Date: 06/02/2020 CLINICAL DATA:  Intermittent slurred speech beginning 4 days ago. EXAM: MRI HEAD WITHOUT CONTRAST TECHNIQUE: Multiplanar, multiecho pulse sequences of the brain and surrounding structures were  obtained without intravenous contrast. COMPARISON:  Head CT yesterday. FINDINGS: Brain: Diffusion imaging shows a cluster of small acute infarctions at the left frontoparietal junction region. No large confluent infarction. No swelling or hemorrhage. Other region of acute insult. The brainstem and cerebellum are normal. Cerebral hemispheres otherwise show chronic small-vessel ischemic changes of the white matter of a mild-to-moderate degree. No hydrocephalus, mass or extra-axial collection. Vascular: Major vessels at the base of the brain show flow. Skull and upper cervical spine: Negative Sinuses/Orbits: Clear/normal Other: None IMPRESSION: 1. Cluster of small acute infarctions at the left frontoparietal junction region, consistent with left MCA branch vessel insult. No large confluent infarction. No swelling or hemorrhage. 2. Mild to moderate chronic small-vessel ischemic changes of the cerebral hemispheric white matter. Electronically Signed   By: Paulina Fusi M.D.   On: 06/02/2020 09:32   ECHOCARDIOGRAM COMPLETE  Result Date: 06/02/2020    ECHOCARDIOGRAM REPORT   Patient  Name:   Melrose NakayamaHARLES M [place]un[place]a Date of Exam: 06/02/2020 Medical Rec #:  [id number]           Height:       69.0 in Accession #:    [id number]          Weight:       220.0 lb Date of Birth:  06/21/1951            BSA:          2.151 m Patient Age:    69 years            BP:           145/93 mmHg Patient Gender: M                   HR:           69 bpm. Exam Location:  Inpatient Procedure: 2D Echo, Cardiac Doppler and Color Doppler Indications:    Stroke 434.91  History:        Patient has prior history of Echocardiogram examinations, most                 recent 11/26/2019. CAD, COPD and TIA, Arrythmias:Atrial                 Fibrillation, Signs/Symptoms:Chest Pain; Risk                 Factors:Hypertension, Dyslipidemia and Former Smoker.  Sonographer:    Renella CunasJulia Swaim RDCS Referring Phys: [id number] [facility] Hospital[facility] KHALIQDINA IMPRESSIONS  1. Left ventricular  ejection fraction, by estimation, is 55 to 60%. The left ventricle has normal function. The left ventricle has no regional wall motion abnormalities. Left ventricular diastolic parameters are consistent with Grade I diastolic dysfunction (impaired relaxation).  2. Right ventricular systolic function is normal. The right ventricular size is normal.  3. The mitral valve is normal in structure. Trivial mitral valve regurgitation. No evidence of mitral stenosis.  4. The aortic valve was not well visualized. Aortic valve regurgitation is mild. Mild aortic valve sclerosis is present, with no evidence of aortic valve stenosis.  5. The inferior vena cava is normal in size with greater than 50% respiratory variability, suggesting right atrial pressure of 3 mmHg. FINDINGS  Left Ventricle: Left ventricular ejection fraction, by estimation, is 55 to 60%. The left ventricle has normal function. The left ventricle has no regional wall motion abnormalities. The left ventricular internal cavity size was normal in size. There is  no left ventricular hypertrophy. Left ventricular diastolic parameters are consistent with Grade I diastolic dysfunction (impaired relaxation). Right Ventricle: The right ventricular size is normal. No increase in right ventricular wall thickness. Right ventricular systolic function is normal. Left Atrium: Left atrial size was normal in size. Right Atrium: Right atrial size was normal in size. Pericardium: There is no evidence of pericardial effusion. Mitral Valve: The mitral valve is normal in structure. Normal mobility of the mitral valve leaflets. Trivial mitral valve regurgitation. No evidence of mitral valve stenosis. Tricuspid Valve: The tricuspid valve is normal in structure. Tricuspid valve regurgitation is mild . No evidence of tricuspid stenosis. Aortic Valve: The aortic valve was not well visualized. Aortic valve regurgitation is mild. Mild aortic valve sclerosis is present, with no evidence of  aortic valve stenosis. Pulmonic Valve: The pulmonic valve was normal in structure. Pulmonic valve regurgitation is not visualized. No evidence of pulmonic stenosis. Aorta: The aortic root is normal in size and structure. Venous: The inferior  vena cava is normal in size with greater than 50% respiratory variability, suggesting right atrial pressure of 3 mmHg. IAS/Shunts: The interatrial septum was not well visualized.  LEFT VENTRICLE PLAX 2D LVIDd:         5.50 cm LVIDs:         4.20 cm LV PW:         0.70 cm LV IVS:        0.70 cm LVOT diam:     2.20 cm LV SV:         84 LV SV Index:   39 LVOT Area:     3.80 cm  LV Volumes (MOD) LV vol d, MOD A2C: 118.0 ml LV vol d, MOD A4C: 113.0 ml LV vol s, MOD A2C: 54.3 ml LV vol s, MOD A4C: 56.9 ml LV SV MOD A2C:     63.7 ml LV SV MOD A4C:     113.0 ml LV SV MOD BP:      57.5 ml RIGHT VENTRICLE TAPSE (M-mode): 2.1 cm LEFT ATRIUM             Index       RIGHT ATRIUM           Index LA Vol (A2C):   32.7 ml 15.20 ml/m RA Area:     15.40 cm LA Vol (A4C):   25.2 ml 11.71 ml/m RA Volume:   41.60 ml  19.34 ml/m LA Biplane Vol: 29.8 ml 13.85 ml/m  AORTIC VALVE LVOT Vmax:   109.00 cm/s LVOT Vmean:  75.000 cm/s LVOT VTI:    0.220 m  AORTA Ao Root diam: 3.80 cm  SHUNTS Systemic VTI:  0.22 m Systemic Diam: 2.20 cm Charlton Haws MD Electronically signed by Charlton Haws MD Signature Date/Time: 06/02/2020/12:51:33 PM    Final    VAS US CAROTID (at [facility] only)  Result Date: 06/02/2020 Carotid Arterial Duplex Study Indications:  Speech disturbance and Syncope. Risk Factors: Hypertension. Performing Technologist: Jannet Askew RCT RDMS  Examination Guidelines: A complete evaluation includes B-mode imaging, spectral Doppler, color Doppler, and power Doppler as needed of all accessible portions of each vessel. Bilateral testing is considered an integral part of a complete examination. Limited examinations for reoccurring indications may be performed as noted.  Right Carotid Findings:  +----------+--------+--------+--------+------------------+------------------+           PSV cm/sEDV cm/sStenosisPlaque DescriptionComments           +----------+--------+--------+--------+------------------+------------------+ CCA Prox  97      20                                                   +----------+--------+--------+--------+------------------+------------------+ CCA Distal85      19                                intimal thickening +----------+--------+--------+--------+------------------+------------------+ ICA Prox  70      26      1-39%                     intimal thickening +----------+--------+--------+--------+------------------+------------------+ ICA Distal85      27                                                   +----------+--------+--------+--------+------------------+------------------+  ECA       92      20                                                   +----------+--------+--------+--------+------------------+------------------+ +----------+--------+-------+--------+-------------------+           PSV cm/sEDV cmsDescribeArm Pressure (mmHG) +----------+--------+-------+--------+-------------------+ [id number]     3                                  +----------+--------+-------+--------+-------------------+ +---------+--------+--+--------+--+---------+ VertebralPSV cm/s68EDV cm/s19Antegrade +---------+--------+--+--------+--+---------+  Left Carotid Findings: +----------+--------+--------+--------+------------------+------------------+           PSV cm/sEDV cm/sStenosisPlaque DescriptionComments           +----------+--------+--------+--------+------------------+------------------+ CCA Prox  93      27                                                   +----------+--------+--------+--------+------------------+------------------+ CCA Distal95      29                                intimal thickening  +----------+--------+--------+--------+------------------+------------------+ ICA Prox  88      22      1-39%                     intimal thickening +----------+--------+--------+--------+------------------+------------------+ ICA Distal72      27                                                   +----------+--------+--------+--------+------------------+------------------+ ECA       97      20                                                   +----------+--------+--------+--------+------------------+------------------+ +----------+--------+--------+--------+-------------------+           PSV cm/sEDV cm/sDescribeArm Pressure (mmHG) +----------+--------+--------+--------+-------------------+ [id number]     1                                   +----------+--------+--------+--------+-------------------+ +---------+--------+--+--------+--+---------+ VertebralPSV cm/s67EDV cm/s20Antegrade +---------+--------+--+--------+--+---------+   Summary: Right Carotid: Velocities in the right ICA are consistent with a 1-39% stenosis. Left Carotid: Velocities in the left ICA are consistent with a 1-39% stenosis. Vertebrals: Bilateral vertebral arteries demonstrate antegrade flow. *See table(s) above for measurements and observations.  Electronically signed by Delia Heady MD on 06/02/2020 at 12:39:33 PM.    Final       The results of significant diagnostics from this hospitalization (including imaging, microbiology, ancillary and laboratory) are listed below for reference.     Microbiology: Recent Results (from the past 240 hour(s))  SARS Coronavirus 2 by RT PCR (hospital order, performed in [facility] hospital lab) Nasopharyngeal Nasopharyngeal  Swab     Status: None   Collection Time: 06/02/20 10:58 AM   Specimen: Nasopharyngeal Swab  Result Value Ref Range Status   SARS Coronavirus 2 NEGATIVE NEGATIVE Final    Comment: (NOTE) SARS-CoV-2 target nucleic acids are NOT  DETECTED.  The SARS-CoV-2 RNA is generally detectable in upper and lower respiratory specimens during the acute phase of infection. The lowest concentration of SARS-CoV-2 viral copies this assay can detect is 250 copies / mL. A negative result does not preclude SARS-CoV-2 infection and should not be used as the sole basis for treatment or other patient management decisions.  A negative result may occur with improper specimen collection / handling, submission of specimen other than nasopharyngeal swab, presence of viral mutation(s) within the areas targeted by this assay, and inadequate number of viral copies (<250 copies / mL). A negative result must be combined with clinical observations, patient history, and epidemiological information.  Fact Sheet for Patients:   BoilerBrush.com.cy  Fact Sheet for Healthcare Providers: https://pope.com/  This test is not yet approved or  cleared by the [place] FDA and has been authorized for detection and/or diagnosis of SARS-CoV-2 by FDA under an Emergency Use Authorization (EUA).  This EUA will remain in effect (meaning this test can be used) for the duration of the COVID-19 declaration under Section 564(b)(1) of the Act, 21 U.S.C. section 360bbb-3(b)(1), unless the authorization is terminated or revoked sooner.  Performed at [facility], [address] [place], [place] [address]      Labs: BNP (last 3 results) No results for input(s): BNP in the last 8760 hours. Basic Metabolic Panel: Recent Labs  Lab 06/01/20 1807  NA 139  K 4.2  CL 104  CO2 26  GLUCOSE 136*  BUN 12  CREATININE 1.02  CALCIUM 9.5   Liver Function Tests: Recent Labs  Lab 06/01/20 1807  AST 19  ALT 21  ALKPHOS 103  BILITOT 0.6  PROT 6.8  ALBUMIN 3.6   No results for input(s): LIPASE, AMYLASE in the last 168 hours. No results for input(s): AMMONIA in the last 168 hours. CBC: Recent Labs   Lab 06/01/20 1807  WBC 8.6  NEUTROABS 5.8  HGB 14.2  HCT 46.3  MCV 88.7  PLT 265   Cardiac Enzymes: No results for input(s): CKTOTAL, CKMB, CKMBINDEX, TROPONINI in the last 168 hours. BNP: Invalid input(s): POCBNP CBG: No results for input(s): GLUCAP in the last 168 hours. D-Dimer No results for input(s): DDIMER in the last 72 hours. Hgb A1c No results for input(s): HGBA1C in the last 72 hours. Lipid Profile No results for input(s): CHOL, HDL, LDLCALC, TRIG, CHOLHDL, LDLDIRECT in the last 72 hours. Thyroid function studies Recent Labs    06/02/20 1445  TSH 3.962   Anemia work up No results for input(s): VITAMINB12, FOLATE, FERRITIN, TIBC, IRON, RETICCTPCT in the last 72 hours. Urinalysis No results found for: COLORURINE, APPEARANCEUR, LABSPEC, PHURINE, GLUCOSEU, HGBUR, BILIRUBINUR, KETONESUR, PROTEINUR, UROBILINOGEN, NITRITE, LEUKOCYTESUR Sepsis Labs Invalid input(s): PROCALCITONIN,  WBC,  LACTICIDVEN Microbiology Recent Results (from the past 240 hour(s))  SARS Coronavirus 2 by RT PCR (hospital order, performed in [facility] hospital lab) Nasopharyngeal Nasopharyngeal Swab     Status: None   Collection Time: 06/02/20 10:58 AM   Specimen: Nasopharyngeal Swab  Result Value Ref Range Status   SARS Coronavirus 2 NEGATIVE NEGATIVE Final    Comment: (NOTE) SARS-CoV-2 target nucleic acids are NOT DETECTED.  The SARS-CoV-2 RNA is generally detectable in upper  and lower respiratory specimens during the acute phase of infection. The lowest concentration of SARS-CoV-2 viral copies this assay can detect is 250 copies / mL. A negative result does not preclude SARS-CoV-2 infection and should not be used as the sole basis for treatment or other patient management decisions.  A negative result may occur with improper specimen collection / handling, submission of specimen other than nasopharyngeal swab, presence of viral mutation(s) within the areas targeted by this assay, and  inadequate number of viral copies (<250 copies / mL). A negative result must be combined with clinical observations, patient history, and epidemiological information.  Fact Sheet for Patients:   BoilerBrush.com.cy  Fact Sheet for Healthcare Providers: https://pope.com/  This test is not yet approved or  cleared by the [place] FDA and has been authorized for detection and/or diagnosis of SARS-CoV-2 by FDA under an Emergency Use Authorization (EUA).  This EUA will remain in effect (meaning this test can be used) for the duration of the COVID-19 declaration under Section 564(b)(1) of the Act, 21 U.S.C. section 360bbb-3(b)(1), unless the authorization is terminated or revoked sooner.  Performed at [facility], [address], [place], [place] [address]      Time coordinating discharge:  I have spent 35 minutes face to face with the patient and on the ward discussing the patients care, assessment, plan and disposition with other care givers. >50% of the time was devoted counseling the patient about the risks and benefits of treatment/Discharge disposition and coordinating care.   SIGNED:   Dimple Nanas, MD  [facility] 06/03/2020, 10:24 AM   If 7PM-7AM, please contact night-coverage

## 2020-06-04 DIAGNOSIS — J449 Chronic obstructive pulmonary disease, unspecified: Secondary | ICD-10-CM | POA: Diagnosis not present

## 2020-06-07 ENCOUNTER — Telehealth: Payer: Self-pay | Admitting: Neurology

## 2020-06-07 NOTE — Telephone Encounter (Signed)
Will defer to Dr Pearlean Brownie to advise.

## 2020-06-07 NOTE — Telephone Encounter (Signed)
Pt was just discharged from [facility] notes say for patient to follow up with Dr. Pearlean Brownie in 3 weeks. Dr. Marlis Edelson next available is November. Please advise if you would like pt to continue to see you in November or if patient can follow up with Shanda Bumps?  Thank you

## 2020-06-07 NOTE — Telephone Encounter (Signed)
Keep Nov F/U and earlier incase of openings

## 2020-06-08 ENCOUNTER — Ambulatory Visit: Payer: Medicare Other | Admitting: Critical Care Medicine

## 2020-06-08 ENCOUNTER — Encounter: Payer: Self-pay | Admitting: Critical Care Medicine

## 2020-06-08 ENCOUNTER — Ambulatory Visit (INDEPENDENT_AMBULATORY_CARE_PROVIDER_SITE_OTHER): Payer: Medicare Other

## 2020-06-08 ENCOUNTER — Other Ambulatory Visit: Payer: Self-pay

## 2020-06-08 VITALS — BP 138/80 | HR 83 | Temp 98.0°F | Ht 70.0 in | Wt 223.0 lb

## 2020-06-08 DIAGNOSIS — R059 Cough, unspecified: Secondary | ICD-10-CM

## 2020-06-08 DIAGNOSIS — J449 Chronic obstructive pulmonary disease, unspecified: Secondary | ICD-10-CM

## 2020-06-08 DIAGNOSIS — R05 Cough: Secondary | ICD-10-CM | POA: Diagnosis not present

## 2020-06-08 MED ORDER — DOXYCYCLINE HYCLATE 100 MG PO TABS: 100.0000 mg | ORAL_TABLET | Freq: Two times a day (BID) | ORAL | 0 refills | Status: DC

## 2020-06-08 MED ORDER — PREDNISONE 20 MG PO TABS: 40.0000 mg | ORAL_TABLET | Freq: Every day | ORAL | 0 refills | Status: DC

## 2020-06-08 NOTE — Patient Instructions (Addendum)
Thank you for visiting Dr. Chestine Spore at [facility]. We recommend the following: Orders Placed This Encounter  Procedures  . DG Chest 2 View   Orders Placed This Encounter  Procedures  . DG Chest 2 View    Standing Status:   Future    Number of Occurrences:   1    Standing Expiration Date:   06/08/2021    Order Specific Question:   Reason for Exam (SYMPTOM  OR DIAGNOSIS REQUIRED)    Answer:   cough, sputum, concern for aspiration pneumonia    Order Specific Question:   Preferred imaging location?    Answer:   Internal    Order Specific Question:   Radiology Contrast Protocol - do NOT remove file path    Answer:   [email or web address]    Meds ordered this encounter  Medications  . doxycycline (VIBRA-TABS) 100 MG tablet    Sig: Take 1 tablet (100 mg total) by mouth 2 (two) times daily.    Dispense:  14 tablet    Refill:  0  . predniSONE (DELTASONE) 20 MG tablet    Sig: Take 2 tablets (40 mg total) by mouth daily with breakfast.    Dispense:  10 tablet    Refill:  0    Return in about 6 months (around 12/06/2020). with Dr. Francine Graven.    Please do your part to reduce the spread of COVID-19.

## 2020-06-08 NOTE — Progress Notes (Signed)
Synopsis: Referred in 2014 for COPD by Farris Has, MD.  Formerly a patient of Nicholas Camacho.  Subjective:   PATIENT ID: Nicholas Camacho GENDER: male DOB: 05/26/1951, MRN: [id number]  Chief Complaint  Patient presents with  . Follow-up    Stroke last week Cough with green sputum which has increased since stroke, shortness of breath about the same since last visit.     Nicholas Camacho is a 69 year old gentleman with a history of COPD and nocturnal hypoxia who presents for follow-up.  He was recently admitted for acute stroke with full resolution of symptoms prior to discharge on 06/03/2020.  Since he left the hospital he has had increased coughing with green sputum production.  No fevers or increased dyspnea.  He has been treating his cough with Mucinex and Robitussin.  He continues to use Trelegy daily for his COPD.  His shortness of breath is similar to his baseline.  He still gets winded when he walks to the top of the stairs.  He is able to do some light yard work, but does not weed eating or mowing anymore.  With significant coughing he still notices a warm sensation going down his right upper extremity, which was one of his initial symptoms with a stroke.  His strength is fully improved.  During his hospital admission he was evaluated by SLP and did not meet criteria for formal swallow evaluation based on his symptoms.  His dysarthria is fully resolved.    OV 11/18/19: Nicholas Camacho is a 69 year old gentleman who presents for follow-up for COPD and chronic hypoxic respiratory failure.  He is on maintenance Trelegy, uses albuterol nebs twice daily, and uses albuterol rescue inhaler multiple times per day, mostly for wheezing.  His symptoms are at baseline.  He uses supplemental oxygen only at night, not during the day.  He has a home pulse ox where he has measured saturations as low as 86%.  His dyspnea on exertion is stable, mostly with stairs or yard work.  He can walk around fairly easily,  but at the grocery store he will lean on a cart by the end of the shopping trip.  He has a baseline cough with clear or green sputum, he frequently has a wet sounding cough, but does not wheeze expectorated.  This mostly happens after using nebulizers.  He has never required hospitalization or emergency department visits for COPD and very infrequently needs antibiotics as an outpatient.  He previously has had significant lower extremity swelling with prednisone and prefers not to take it.  He no longer smokes since quitting in 2017 after 67.5 pack- years.  He follows with Kandice Robinsons for his lung nodules.   Past Medical History:  Diagnosis Date  . COPD (chronic obstructive pulmonary disease) (HCC)   . Disturbance of skin sensation 06/24/2013  . Dysrhythmia   . Elevated cholesterol   . History of echocardiogram    Echo 4/17: EF 60-65%, no RWMA, Gr 2 DD  . Hypertension   . Sleep disturbance, unspecified 06/24/2013     Family History  Problem Relation Age of Onset  . Diabetes Mother   . Prostate cancer Father   . Kidney failure Father   . Alcoholism Sister        history of  . Drug abuse Sister        history of  . Stroke Neg Hx      Past Surgical History:  Procedure Laterality Date  . ESOPHAGOGASTRODUODENOSCOPY N/A 01/08/2016  Procedure: ESOPHAGOGASTRODUODENOSCOPY (EGD);  Surgeon: Carman Ching, MD;  Location: [facility] ENDOSCOPY;  Service: Endoscopy;  Laterality: N/A;  . fractured pelvis    . INGUINAL HERNIA REPAIR Bilateral   . LEFT HEART CATH AND CORONARY ANGIOGRAPHY N/A 08/30/2017   Procedure: LEFT HEART CATH AND CORONARY ANGIOGRAPHY;  Surgeon: Kathleene Hazel, MD;  Location: [facility] INVASIVE CV LAB;  Service: Cardiovascular;  Laterality: N/A;  . reconstruct left heel  2002  . TONSILLECTOMY      Social History   Socioeconomic History  . Marital status: Married    Spouse name: Not on file  . Number of children: 2  . Years of education: COLLEGE  . Highest education level: Not  on file  Occupational History  . Occupation: retired  Tobacco Use  . Smoking status: Former Smoker    Packs/day: 1.50    Years: 45.00    Pack years: 67.50    Types: Cigarettes    Quit date: 11/19/2015    Years since quitting: 4.5  . Smokeless tobacco: Never Used  . Tobacco comment: pt quit smoking!! 11/19/15  Vaping Use  . Vaping Use: Never used  Substance and Sexual Activity  . Alcohol use: No    Alcohol/week: 0.0 standard drinks  . Drug use: No  . Sexual activity: Not on file  Other Topics Concern  . Not on file  Social History Narrative  . Not on file   Social Determinants of Health   Financial Resource Strain:   . Difficulty of Paying Living Expenses: Not on file  Food Insecurity:   . Worried About Programme researcher, broadcasting/film/video in the Last Year: Not on file  . Ran Out of Food in the Last Year: Not on file  Transportation Needs:   . Lack of Transportation (Medical): Not on file  . Lack of Transportation (Non-Medical): Not on file  Physical Activity:   . Days of Exercise per Week: Not on file  . Minutes of Exercise per Session: Not on file  Stress:   . Feeling of Stress : Not on file  Social Connections:   . Frequency of Communication with Friends and Family: Not on file  . Frequency of Social Gatherings with Friends and Family: Not on file  . Attends Religious Services: Not on file  . Active Member of Clubs or Organizations: Not on file  . Attends Banker Meetings: Not on file  . Marital Status: Not on file  Intimate Partner Violence:   . Fear of Current or Ex-Partner: Not on file  . Emotionally Abused: Not on file  . Physically Abused: Not on file  . Sexually Abused: Not on file     Allergies  Allergen Reactions  . Morphine And Related     HALLUCINATIONS     Immunization History  Administered Date(s) Administered  . Influenza, High Dose Seasonal PF 07/22/2018, 08/20/2019  . Influenza,inj,Quad PF,6+ Mos 06/20/2015, 11/02/2016, 07/25/2017  .  Influenza-Unspecified 08/24/2019  . PFIZER SARS-COV-2 Vaccination 10/20/2019, 11/10/2019  . Pneumococcal Conjugate-13 10/06/2013    Outpatient Medications Prior to Visit  Medication Sig Dispense Refill  . atorvastatin (LIPITOR) 80 MG tablet TAKE 1 TABLET BY MOUTH EVERY DAY 90 tablet 2  . Cyanocobalamin 1000 MCG CAPS Take 1,000 mcg by mouth daily.    [name] diltiazem (CARDIZEM CD) 120 MG 24 hr capsule Take 1 capsule (120 mg total) by mouth at bedtime. 90 capsule 3  . ELIQUIS 5 MG TABS tablet TAKE 1 TABLET BY MOUTH TWICE DAILY( OVERDUE  FOR FOLLOW UP, NEEDS TO SCHEDULE FOR FURTHER REFILLS) (Patient taking differently: Take 5 mg by mouth 2 (two) times daily. ) 60 tablet 6  . fexofenadine (ALLEGRA) 180 MG tablet Take 180 mg by mouth daily.    . fluticasone (FLONASE) 50 MCG/ACT nasal spray Place 2 sprays into both nostrils 2 (two) times daily. 16 g 3  . Fluticasone-Umeclidin-Vilant (TRELEGY ELLIPTA) 100-62.5-25 MCG/INH AEPB Inhale 1 puff into the lungs daily. 1 each 11  . ipratropium-albuterol (DUONEB) 0.5-2.5 (3) MG/3ML SOLN Take 3 mLs by nebulization 3 (three) times daily. 360 mL 11  . losartan (COZAAR) 50 MG tablet TAKE 1 TABLET(50 MG) BY MOUTH DAILY (Patient taking differently: Take 50 mg by mouth daily. ) 90 tablet 3  . nitroGLYCERIN (NITROSTAT) 0.4 MG SL tablet Place 1 tablet (0.4 mg total) under the tongue every 5 (five) minutes as needed for chest pain. 25 tablet 2  . Respiratory Therapy Supplies (FLUTTER) DEVI 1 Device by Does not apply route in the morning and at bedtime. 1 each 0  . VENTOLIN HFA 108 (90 Base) MCG/ACT inhaler INHALE 2 PUFFS BY MOUTH EVERY 6 HOURS AS NEEDED FOR WHEEZING OR SHORTNESS OF BREATH (Patient taking differently: Inhale 2 puffs into the lungs every 6 (six) hours as needed for wheezing or shortness of breath. ) 54 g 2   No facility-administered medications prior to visit.    Review of Systems  Constitutional: Negative.   Respiratory: Positive for cough, sputum  production, shortness of breath and wheezing.      Objective:   Vitals:   06/08/20 1119  BP: 138/80  Pulse: 83  Temp: 98 F (36.7 C)  TempSrc: Temporal  SpO2: 91%  Weight: 223 lb (101.2 kg)  Height: 5\' 10"  (1.778 m)   91% on   RA BMI Readings from Last 3 Encounters:  06/08/20 32.00 kg/m  06/01/20 32.49 kg/m  03/09/20 31.91 kg/m   Wt Readings from Last 3 Encounters:  06/08/20 223 lb (101.2 kg)  06/01/20 220 lb (99.8 kg)  03/09/20 222 lb 6.4 oz (100.9 kg)    Physical Exam Vitals reviewed.  Constitutional:      General: He is not in acute distress.    Appearance: Normal appearance. He is not ill-appearing.  HENT:     Head: Normocephalic and atraumatic.  Eyes:     General: No scleral icterus. Cardiovascular:     Rate and Rhythm: Normal rate. Rhythm irregular.  Pulmonary:     Comments: Mild expiratory wheezing, right greater than left.  No inspiratory rales.  Mild egophony right base. Abdominal:     Palpations: Abdomen is soft.     Tenderness: There is no abdominal tenderness.  Musculoskeletal:        General: No swelling or deformity.     Cervical back: Neck supple.  Lymphadenopathy:     Cervical: No cervical adenopathy.  Skin:    General: Skin is warm and dry.     Findings: No rash.  Neurological:     General: No focal deficit present.     Mental Status: He is alert.     Coordination: Coordination normal.  Psychiatric:        Mood and Affect: Mood normal.        Behavior: Behavior normal.      CBC    Component Value Date/Time   WBC 8.6 06/01/2020 1807   RBC 5.22 06/01/2020 1807   HGB 14.2 06/01/2020 1807   HGB 14.7 11/23/2019 1156   HCT  46.3 06/01/2020 1807   HCT 43.8 11/23/2019 1156   PLT 265 06/01/2020 1807   PLT 373 11/23/2019 1156   MCV 88.7 06/01/2020 1807   MCV 83 11/23/2019 1156   MCH 27.2 06/01/2020 1807   MCHC 30.7 06/01/2020 1807   RDW 14.3 06/01/2020 1807   RDW 13.2 11/23/2019 1156   LYMPHSABS 1.4 06/01/2020 1807   LYMPHSABS  1.7 11/23/2019 1156   MONOABS 0.9 06/01/2020 1807   EOSABS 0.4 06/01/2020 1807   EOSABS 0.3 11/23/2019 1156   BASOSABS 0.0 06/01/2020 1807   BASOSABS 0.0 11/23/2019 1156    CHEMISTRY Recent Labs  Lab 06/01/20 1807  NA 139  K 4.2  CL 104  CO2 26  GLUCOSE 136*  BUN 12  CREATININE 1.02  CALCIUM 9.5   Estimated Creatinine Clearance: 82.6 mL/min (by C-G formula based on SCr of 1.02 mg/dL).   Chest Imaging- films reviewed: CT chest low dose 09/07/2019- apical scarring, emphysema,  airway thickening, mucus impactions, linear scars in LL. Fissural RML nodule. Lung RADS 2  CXR, 2 view 06/01/2020- mild hyperinflation, no opacities.  CXR 2 view 06/08/20: Mildly increased opacification lateral right lower lobe  Pulmonary Functions Testing Results: PFT Results Latest Ref Rng & Units 11/18/2019 10/23/2013  FVC-Pre L 3.12 4.11  FVC-Predicted Pre % 74 93  FVC-Post L 2.85 4.12  FVC-Predicted Post % 67 94  Pre FEV1/FVC % % 53 58  Post FEV1/FCV % % 54 58  FEV1-Pre L 1.64 2.38  FEV1-Predicted Pre % 52 72  FEV1-Post L 1.54 2.41  DLCO uncorrected ml/min/mmHg 19.63 21.40  DLCO UNC% % 79 72  DLVA Predicted % 99 72  TLC L 7.38 8.64  TLC % Predicted % 111 130  RV % Predicted % 187 206    2021: Moderate obstruction without bronchodilator reversibility. Significant air trapping without hyperinflation.  Mildly reduced diffusion.  2015: Moderate obstruction with hyperinflation, no significant bronchodilator reversibility.  Mild diffusion impairment.      Assessment & Plan:     ICD-10-CM   1. Chronic obstructive pulmonary disease, unspecified COPD type (HCC)  J44.9 DG Chest 2 View  2. Cough  R05 DG Chest 2 View     COPD, GOLD B. Acute exacerbation today, concern for possible aspiration vs hospital related infection. No significant exacerbations in the past. -Continue Trelegy once daily; rinse after every use -Continue duonebs BID and as needed with flutter valve. -CXR today.  If concern  for aspiration in the future, would recommend formal SLP evaluation -Continue albuterol every 4 hours as needed -Up-to-date on Covid vaccine and pneumococcal 13 vaccine.  Needs annual flu vaccine.  Planning to get a Covid booster when available to him. -Continue regular physical activity to maintain exercise tolerance.  Chronic hypoxic respiratory failure -Continue supplemental oxygen at night.  Pulmonary nodules (Lung RADS 2), history of tobacco abuse -12 month follow up CT Dec 2021  RTC in 6 months with Dr. Francine Graven.   Current Outpatient Medications:  .  atorvastatin (LIPITOR) 80 MG tablet, TAKE 1 TABLET BY MOUTH EVERY DAY, Disp: 90 tablet, Rfl: 2 .  Cyanocobalamin 1000 MCG CAPS, Take 1,000 mcg by mouth daily., Disp: , Rfl:  .  diltiazem (CARDIZEM CD) 120 MG 24 hr capsule, Take 1 capsule (120 mg total) by mouth at bedtime., Disp: 90 capsule, Rfl: 3 .  ELIQUIS 5 MG TABS tablet, TAKE 1 TABLET BY MOUTH TWICE DAILY( OVERDUE FOR FOLLOW UP, NEEDS TO SCHEDULE FOR FURTHER REFILLS) (Patient taking differently: Take 5  mg by mouth 2 (two) times daily. ), Disp: 60 tablet, Rfl: 6 .  fexofenadine (ALLEGRA) 180 MG tablet, Take 180 mg by mouth daily., Disp: , Rfl:  .  fluticasone (FLONASE) 50 MCG/ACT nasal spray, Place 2 sprays into both nostrils 2 (two) times daily., Disp: 16 g, Rfl: 3 .  Fluticasone-Umeclidin-Vilant (TRELEGY ELLIPTA) 100-62.5-25 MCG/INH AEPB, Inhale 1 puff into the lungs daily., Disp: 1 each, Rfl: 11 .  ipratropium-albuterol (DUONEB) 0.5-2.5 (3) MG/3ML SOLN, Take 3 mLs by nebulization 3 (three) times daily., Disp: 360 mL, Rfl: 11 .  losartan (COZAAR) 50 MG tablet, TAKE 1 TABLET(50 MG) BY MOUTH DAILY (Patient taking differently: Take 50 mg by mouth daily. ), Disp: 90 tablet, Rfl: 3 .  nitroGLYCERIN (NITROSTAT) 0.4 MG SL tablet, Place 1 tablet (0.4 mg total) under the tongue every 5 (five) minutes as needed for chest pain., Disp: 25 tablet, Rfl: 2 .  Respiratory Therapy Supplies (FLUTTER)  DEVI, 1 Device by Does not apply route in the morning and at bedtime., Disp: 1 each, Rfl: 0 .  VENTOLIN HFA 108 (90 Base) MCG/ACT inhaler, INHALE 2 PUFFS BY MOUTH EVERY 6 HOURS AS NEEDED FOR WHEEZING OR SHORTNESS OF BREATH (Patient taking differently: Inhale 2 puffs into the lungs every 6 (six) hours as needed for wheezing or shortness of breath. ), Disp: 54 g, Rfl: 2 .  doxycycline (VIBRA-TABS) 100 MG tablet, Take 1 tablet (100 mg total) by mouth 2 (two) times daily., Disp: 14 tablet, Rfl: 0 .  predniSONE (DELTASONE) 20 MG tablet, Take 2 tablets (40 mg total) by mouth daily with breakfast., Disp: 10 tablet, Rfl: 0    Steffanie Dunn, DO [facility] 06/08/2020 12:06 PM

## 2020-06-10 DIAGNOSIS — I10 Essential (primary) hypertension: Secondary | ICD-10-CM | POA: Diagnosis not present

## 2020-06-10 DIAGNOSIS — I639 Cerebral infarction, unspecified: Secondary | ICD-10-CM | POA: Diagnosis not present

## 2020-06-10 DIAGNOSIS — E785 Hyperlipidemia, unspecified: Secondary | ICD-10-CM | POA: Diagnosis not present

## 2020-06-10 DIAGNOSIS — J449 Chronic obstructive pulmonary disease, unspecified: Secondary | ICD-10-CM | POA: Diagnosis not present

## 2020-06-14 NOTE — Progress Notes (Signed)
Cardiology Office Note  Date:  06/14/2020   ID:  Nicholas Camacho, DOB 24-Apr-1951, MRN [id number]  PCP:  Nicholas Has, MD  Cardiologist:  Dr. Delton Camacho  _____________  Hospital f/u for stroke  _____________   History of Present Illness: Nicholas Camacho is a 69 y.o. male with pmh of PAF with CHADSVASC of at least 3 on Eliquis, GI bleed on Xarelto, HTN, HLD, COPD, OSA, small AAA, prior tobacco use, and iliac aneurysm who is being seen for hospital follow-up for stroke.   Patient had a Coronary CT 08/2017 for mild fatigue showing calcium score of 3244, moderate stenosis in the LAD and RCA with suspicion for severe stenosis in the mid RCA. Cardiac cath 08/30/2017 showed moderate nonobstructive calcific stenosis in the RCA with mild nonobstructive calcific stenosis in the LAD and circumflex with normal LV function and medical management was recommended.   11/2017 the patient was complaining of dyspnea thought to be related to COPD. Dilt and aspirin were stopped.   The patient was last seen 03/2020 for 3 month follow-up. He was feeling well. No cardiac symptoms.   The patient was admitted 9/1-9/3 with slurred speech and rt arm weakness. MRI brain showed CVA, suspected it was from noncompliance of Eliquis. Echo showed EF 55-60% with G1DD.   Today, he presents for hospital follow-up. Patient reported he was occasionally missing PM dose of Eliquis for the last 1-2 months. He didn't understand why it was important to take, this was discussed.  He Camacho been recovering well since discharge. No deficits on exam. Did not qualify for PT/OT/Speech therapy at discharge. He was recently seen by pulm for cough and prescribed doxy and prednisone for early PNA. He finished 2 days ago and is feeling better. Still Camacho chronic sob with occasional cough. He uses a cane but this is for chronic Rt knee pain/arthrtis. He lives with his wife at home, able to perform all ADLs. He Camacho a follow-up in November with  neurology although needs to be seen earlier. BP good today. EKG shows SR with PACs and rare PVCs. denies symptoms of palpitations, chest pain, orthopnea, PND, lower extremity edema, claudication, dizziness, presyncope, syncope, bleeding, or neurologic sequela. The patient is tolerating medications without difficulties and is otherwise without complaint today.  _____________   Past Medical History:  Diagnosis Date  . COPD (chronic obstructive pulmonary disease) (HCC)   . Disturbance of skin sensation 06/24/2013  . Dysrhythmia   . Elevated cholesterol   . History of echocardiogram    Echo 4/17: EF 60-65%, no RWMA, Gr 2 DD  . Hypertension   . Sleep disturbance, unspecified 06/24/2013   Past Surgical History:  Procedure Laterality Date  . ESOPHAGOGASTRODUODENOSCOPY N/A 01/08/2016   Procedure: ESOPHAGOGASTRODUODENOSCOPY (EGD);  Surgeon: Carman Ching, MD;  Location: [facility] ENDOSCOPY;  Service: Endoscopy;  Laterality: N/A;  . fractured pelvis    . INGUINAL HERNIA REPAIR Bilateral   . LEFT HEART CATH AND CORONARY ANGIOGRAPHY N/A 08/30/2017   Procedure: LEFT HEART CATH AND CORONARY ANGIOGRAPHY;  Surgeon: Kathleene Hazel, MD;  Location: [facility] INVASIVE CV LAB;  Service: Cardiovascular;  Laterality: N/A;  . reconstruct left heel  2002  . TONSILLECTOMY     _____________  Current Outpatient Medications  Medication Sig Dispense Refill  . atorvastatin (LIPITOR) 80 MG tablet TAKE 1 TABLET BY MOUTH EVERY DAY 90 tablet 2  . Cyanocobalamin 1000 MCG CAPS Take 1,000 mcg by mouth daily.    [name] diltiazem (CARDIZEM CD) 120 MG 24  hr capsule Take 1 capsule (120 mg total) by mouth at bedtime. 90 capsule 3  . doxycycline (VIBRA-TABS) 100 MG tablet Take 1 tablet (100 mg total) by mouth 2 (two) times daily. 14 tablet 0  . ELIQUIS 5 MG TABS tablet TAKE 1 TABLET BY MOUTH TWICE DAILY( OVERDUE FOR FOLLOW UP, NEEDS TO SCHEDULE FOR FURTHER REFILLS) (Patient taking differently: Take 5 mg by mouth 2 (two) times daily. ) 60  tablet 6  . fexofenadine (ALLEGRA) 180 MG tablet Take 180 mg by mouth daily.    . fluticasone (FLONASE) 50 MCG/ACT nasal spray Place 2 sprays into both nostrils 2 (two) times daily. 16 g 3  . Fluticasone-Umeclidin-Vilant (TRELEGY ELLIPTA) 100-62.5-25 MCG/INH AEPB Inhale 1 puff into the lungs daily. 1 each 11  . ipratropium-albuterol (DUONEB) 0.5-2.5 (3) MG/3ML SOLN Take 3 mLs by nebulization 3 (three) times daily. 360 mL 11  . losartan (COZAAR) 50 MG tablet TAKE 1 TABLET(50 MG) BY MOUTH DAILY (Patient taking differently: Take 50 mg by mouth daily. ) 90 tablet 3  . nitroGLYCERIN (NITROSTAT) 0.4 MG SL tablet Place 1 tablet (0.4 mg total) under the tongue every 5 (five) minutes as needed for chest pain. 25 tablet 2  . predniSONE (DELTASONE) 20 MG tablet Take 2 tablets (40 mg total) by mouth daily with breakfast. 10 tablet 0  . Respiratory Therapy Supplies (FLUTTER) DEVI 1 Device by Does not apply route in the morning and at bedtime. 1 each 0  . VENTOLIN HFA 108 (90 Base) MCG/ACT inhaler INHALE 2 PUFFS BY MOUTH EVERY 6 HOURS AS NEEDED FOR WHEEZING OR SHORTNESS OF BREATH (Patient taking differently: Inhale 2 puffs into the lungs every 6 (six) hours as needed for wheezing or shortness of breath. ) 54 g 2   No current facility-administered medications for this visit.   _____________   Allergies:   Morphine and related  _____________   Social History:  The patient  reports that he quit smoking about 4 years ago. His smoking use included cigarettes. He Camacho a 67.50 pack-year smoking history. He Camacho never used smokeless tobacco. He reports that he does not drink alcohol and does not use drugs.  _____________   Family History:  The patient's family history includes Alcoholism in his sister; Diabetes in his mother; Drug abuse in his sister; Kidney failure in his father; Prostate cancer in his father.  _____________   ROS:  Please Camacho the history of present illness.   Positive for chronic sob, occasional  cough,   All other systems are reviewed and negative.  _____________   PHYSICAL EXAM: VS:  There were no vitals taken for this visit. , BMI There is no height or weight on file to calculate BMI. GEN: Well nourished, well developed, in no acute distress  HEENT: normal  Neck: no JVD, carotid bruits, or masses Cardiac: RR, tachycardia, no murmurs, rubs, or gallops. No clubbing, cyanosis, edema.  Radials/DP/PT 2+ and equal bilaterally.  Respiratory:  Mild rhonchi at bases GI: soft, nontender, nondistended, + BS MS: no deformity or atrophy  Skin: warm and dry, no rash Neuro:  Strength and sensation are intact Psych: euthymic mood, full affect _____________  EKG:   The ekg ordered today shows NSR, PACs, rare PVC, 109bpm  Recent Labs: 11/23/2019: NT-Pro BNP 70 06/01/2020: ALT 21; BUN 12; Creatinine, Ser 1.02; Hemoglobin 14.2; Platelets 265; Potassium 4.2; Sodium 139 06/02/2020: TSH 3.962  06/03/2020: Cholesterol 88; HDL 24; LDL Cholesterol 48; Total CHOL/HDL Ratio 3.7; Triglycerides 82; VLDL 16  Estimated Creatinine Clearance: 82.6 mL/min (by C-G formula based on SCr of 1.02 mg/dL).  Wt Readings from Last 3 Encounters:  06/08/20 223 lb (101.2 kg)  06/01/20 220 lb (99.8 kg)  03/09/20 222 lb 6.4 oz (100.9 kg)    Echo 06/02/20 1. Left ventricular ejection fraction, by estimation, is 55 to 60%. The  left ventricle Camacho normal function. The left ventricle Camacho no regional  wall motion abnormalities. Left ventricular diastolic parameters are  consistent with Grade I diastolic  dysfunction (impaired relaxation).  2. Right ventricular systolic function is normal. The right ventricular  size is normal.  3. The mitral valve is normal in structure. Trivial mitral valve  regurgitation. No evidence of mitral stenosis.  4. The aortic valve was not well visualized. Aortic valve regurgitation  is mild. Mild aortic valve sclerosis is present, with no evidence of  aortic valve stenosis.  5. The inferior  vena cava is normal in size with greater than 50%  respiratory variability, suggesting right atrial pressure of 3 mmHg.   Cardiac cath 08/2017  The left ventricular systolic function is normal.  LV end diastolic pressure is normal.  The left ventricular ejection fraction is 55-65% by visual estimate.  There is no mitral valve regurgitation.  Prox RCA lesion is 40% stenosed.  Prox RCA to Mid RCA lesion is 50% stenosed.  Mid RCA-1 lesion is 30% stenosed.  Mid RCA-2 lesion is 50% stenosed.  Prox Cx lesion is 40% stenosed.  Ost LAD to Prox LAD lesion is 30% stenosed.   1. Moderate non-obstructive, calcific stenoses in the RCA 2. Mild non-obstructive, calcific stenoses in the LAD and Circumflex 3. Normal LV systolic function  Recommendations: Medical management of CAD. Tobacco cessation.    _____________   ASSESSMENT AND PLAN:  Recent CVA Patient recently admitted for stroke, presented with slurred speech and possible Rt arm weakness. Apparently he had been forgetting PM doses of Eliquis occasionally for the last 1-2 months. He Camacho no deficits on exam today. Speech completely recovered. Did not qualify for PT/Ot/speech therapy at discharge. Continue Eliquis. Camacho a follow-up with neurology in November. Message sent to try and get a sooner appointment for patient.   Paroxysmal Afib CHADSVASC now at least 6(CAD, HTN, CVAx2, agex1) In SR with PACs today. Says goes in and out of afib however is asymptomatic. Stressed the importance of not missing Eliquis doses.   Chronic SOB/COPD Recent cough with suspected PNA and placed on doxy and prednisone per pulmonology. Finished the course 2 days ago. Feels better, still Camacho chronic SOB. Follows with pulmonology.   CAD with moderate nonobstructive disease No chest pain. No Aspirin with Eliquis. Apparently had anemia in the past with both. Continue statin  HLD Lipitor daily. Recent LDL 48  HTN BP well controlled with Cardizem 120mg   daily and losartan 50mg  daily  Ascending Aortic Aneurysm  Yearly follow-up. Most recent echo did not comment on this. Echo from 11/2019 showed mild aortic dilaton of 20mm.   Disposition:   FU with 6 months with MD/APP   Signed, Felecity Lemaster 11/2019, PA-C 06/14/2020 3:15 PM    _____________ [facility] [address] [place]  [phone] (office) [phone] (fax)

## 2020-06-16 ENCOUNTER — Encounter: Payer: Self-pay | Admitting: Cardiology

## 2020-06-16 ENCOUNTER — Other Ambulatory Visit: Payer: Self-pay

## 2020-06-16 ENCOUNTER — Ambulatory Visit: Payer: Medicare Other | Admitting: Cardiology

## 2020-06-16 VITALS — BP 136/74 | HR 84 | Ht 69.0 in | Wt 222.0 lb

## 2020-06-16 DIAGNOSIS — I7121 Aneurysm of the ascending aorta, without rupture: Secondary | ICD-10-CM

## 2020-06-16 DIAGNOSIS — E785 Hyperlipidemia, unspecified: Secondary | ICD-10-CM | POA: Diagnosis not present

## 2020-06-16 DIAGNOSIS — I63419 Cerebral infarction due to embolism of unspecified middle cerebral artery: Secondary | ICD-10-CM

## 2020-06-16 DIAGNOSIS — I10 Essential (primary) hypertension: Secondary | ICD-10-CM | POA: Diagnosis not present

## 2020-06-16 DIAGNOSIS — I712 Thoracic aortic aneurysm, without rupture: Secondary | ICD-10-CM | POA: Diagnosis not present

## 2020-06-16 DIAGNOSIS — I480 Paroxysmal atrial fibrillation: Secondary | ICD-10-CM

## 2020-06-16 DIAGNOSIS — I2510 Atherosclerotic heart disease of native coronary artery without angina pectoris: Secondary | ICD-10-CM

## 2020-06-16 NOTE — Patient Instructions (Signed)
Medication Instructions:  Your physician recommends that you continue on your current medications as directed. Please refer to the Current Medication list given to you today.  *If you need a refill on your cardiac medications before your next appointment, please call your pharmacy*   Lab Work: None ordered  If you have labs (blood work) drawn today and your tests are completely normal, you will receive your results only by: . MyChart Message (if you have MyChart) OR . A paper copy in the mail If you have any lab test that is abnormal or we need to change your treatment, we will call you to review the results.   Testing/Procedures: None ordered   Follow-Up: At [facility], you and your health needs are our priority.  As part of our continuing mission to provide you with exceptional heart care, we have created designated Provider Care Teams.  These Care Teams include your primary Cardiologist (physician) and Advanced Practice Providers (APPs -  Physician Assistants and Nurse Practitioners) who all work together to provide you with the care you need, when you need it.  We recommend signing up for the patient portal called "MyChart".  Sign up information is provided on this After Visit Summary.  MyChart is used to connect with patients for Virtual Visits (Telemedicine).  Patients are able to view lab/test results, encounter notes, upcoming appointments, etc.  Non-urgent messages can be sent to your provider as well.   To learn more about what you can do with MyChart, go to [email or web address].com.    Your next appointment:   6 month(s)  The format for your next appointment:   In Person  Provider:   You may see Katarina Nelson, MD or one of the following Advanced Practice Providers on your designated Care Team:    Dayna Dunn, PA-C  Michele Lenze, PA-C    Other Instructions   

## 2020-06-17 DIAGNOSIS — Z09 Encounter for follow-up examination after completed treatment for conditions other than malignant neoplasm: Secondary | ICD-10-CM | POA: Diagnosis not present

## 2020-06-17 DIAGNOSIS — I10 Essential (primary) hypertension: Secondary | ICD-10-CM | POA: Diagnosis not present

## 2020-06-17 DIAGNOSIS — I4891 Unspecified atrial fibrillation: Secondary | ICD-10-CM | POA: Diagnosis not present

## 2020-06-23 ENCOUNTER — Encounter: Payer: Self-pay | Admitting: Neurology

## 2020-06-23 ENCOUNTER — Ambulatory Visit: Payer: Medicare Other | Admitting: Neurology

## 2020-06-23 VITALS — BP 109/70 | HR 87 | Ht 69.0 in | Wt 220.0 lb

## 2020-06-23 DIAGNOSIS — I63412 Cerebral infarction due to embolism of left middle cerebral artery: Secondary | ICD-10-CM

## 2020-06-23 DIAGNOSIS — R4789 Other speech disturbances: Secondary | ICD-10-CM | POA: Diagnosis not present

## 2020-06-23 NOTE — Patient Instructions (Signed)
I had a long d/w patient about his recent embolic stroke,atrial fibrillation, risk for recurrent stroke/TIAs, personally independently reviewed imaging studies and stroke evaluation results and answered questions.Continue Eliquis (apixaban) daily  for secondary stroke prevention and maintain strict control of hypertension with blood pressure goal below 130/90, diabetes with hemoglobin A1c goal below 6.5% and lipids with LDL cholesterol goal below 70 mg/dL. I also advised the patient to eat a healthy diet with plenty of whole grains, cereals, fruits and vegetables, exercise regularly and maintain ideal body weight.  I stressed the need for medication compliance and advised him to eat healthy and lose weight and be more active.  Followup in the future with my nurse practitioner Shanda Bumps in 6 months or call earlier if necessary.  Stroke Prevention Some medical conditions and behaviors are associated with a higher chance of having a stroke. You can help prevent a stroke by making nutrition, lifestyle, and other changes, including managing any medical conditions you may have. What nutrition changes can be made?   Eat healthy foods. You can do this by: ? Choosing foods high in fiber, such as fresh fruits and vegetables and whole grains. ? Eating at least 5 or more servings of fruits and vegetables a day. Try to fill half of your plate at each meal with fruits and vegetables. ? Choosing lean protein foods, such as lean cuts of meat, poultry without skin, fish, tofu, beans, and nuts. ? Eating low-fat dairy products. ? Avoiding foods that are high in salt (sodium). This can help lower blood pressure. ? Avoiding foods that have saturated fat, trans fat, and cholesterol. This can help prevent high cholesterol. ? Avoiding processed and premade foods.  Follow your health care provider's specific guidelines for losing weight, controlling high blood pressure (hypertension), lowering high cholesterol, and managing  diabetes. These may include: ? Reducing your daily calorie intake. ? Limiting your daily sodium intake to 1,500 milligrams (mg). ? Using only healthy fats for cooking, such as olive oil, canola oil, or sunflower oil. ? Counting your daily carbohydrate intake. What lifestyle changes can be made?  Maintain a healthy weight. Talk to your health care provider about your ideal weight.  Get at least 30 minutes of moderate physical activity at least 5 days a week. Moderate activity includes brisk walking, biking, and swimming.  Do not use any products that contain nicotine or tobacco, such as cigarettes and e-cigarettes. If you need help quitting, ask your health care provider. It may also be helpful to avoid exposure to secondhand smoke.  Limit alcohol intake to no more than 1 drink a day for nonpregnant women and 2 drinks a day for men. One drink equals 12 oz of beer, 5 oz of wine, or 1 oz of hard liquor.  Stop any illegal drug use.  Avoid taking birth control pills. Talk to your health care provider about the risks of taking birth control pills if: ? You are over 81 years old. ? You smoke. ? You get migraines. ? You have ever had a blood clot. What other changes can be made?  Manage your cholesterol levels. ? Eating a healthy diet is important for preventing high cholesterol. If cholesterol cannot be managed through diet alone, you may also need to take medicines. ? Take any prescribed medicines to control your cholesterol as told by your health care provider.  Manage your diabetes. ? Eating a healthy diet and exercising regularly are important parts of managing your blood sugar. If your blood sugar  cannot be managed through diet and exercise, you may need to take medicines. ? Take any prescribed medicines to control your diabetes as told by your health care provider.  Control your hypertension. ? To reduce your risk of stroke, try to keep your blood pressure below 130/80. ? Eating a  healthy diet and exercising regularly are an important part of controlling your blood pressure. If your blood pressure cannot be managed through diet and exercise, you may need to take medicines. ? Take any prescribed medicines to control hypertension as told by your health care provider. ? Ask your health care provider if you should monitor your blood pressure at home. ? Have your blood pressure checked every year, even if your blood pressure is normal. Blood pressure increases with age and some medical conditions.  Get evaluated for sleep disorders (sleep apnea). Talk to your health care provider about getting a sleep evaluation if you snore a lot or have excessive sleepiness.  Take over-the-counter and prescription medicines only as told by your health care provider. Aspirin or blood thinners (antiplatelets or anticoagulants) may be recommended to reduce your risk of forming blood clots that can lead to stroke.  Make sure that any other medical conditions you have, such as atrial fibrillation or atherosclerosis, are managed. What are the warning signs of a stroke? The warning signs of a stroke can be easily remembered as BEFAST.  B is for balance. Signs include: ? Dizziness. ? Loss of balance or coordination. ? Sudden trouble walking.  E is for eyes. Signs include: ? A sudden change in vision. ? Trouble seeing.  F is for face. Signs include: ? Sudden weakness or numbness of the face. ? The face or eyelid drooping to one side.  A is for arms. Signs include: ? Sudden weakness or numbness of the arm, usually on one side of the body.  S is for speech. Signs include: ? Trouble speaking (aphasia). ? Trouble understanding.  T is for time. ? These symptoms may represent a serious problem that is an emergency. Do not wait to see if the symptoms will go away. Get medical help right away. Call your local emergency services (911 in the [place] Do not drive yourself to the hospital.  Other  signs of stroke may include: ? A sudden, severe headache with no known cause. ? Nausea or vomiting. ? Seizure. Where to find more information For more information, visit:  American Stroke Association: www.strokeassociation.org  National Stroke Association: www.stroke.org Summary  You can prevent a stroke by eating healthy, exercising, not smoking, limiting alcohol intake, and managing any medical conditions you may have.  Do not use any products that contain nicotine or tobacco, such as cigarettes and e-cigarettes. If you need help quitting, ask your health care provider. It may also be helpful to avoid exposure to secondhand smoke.  Remember BEFAST for warning signs of stroke. Get help right away if you or a loved one has any of these signs. This information is not intended to replace advice given to you by your health care provider. Make sure you discuss any questions you have with your health care provider. Document Revised: 08/30/2017 Document Reviewed: 10/23/2016 Elsevier Patient Education  2020 [employer].

## 2020-06-23 NOTE — Progress Notes (Signed)
[facility] [address] [place]. [place] [address] [phone]       OFFICE FOLLOW-UP NOTE  [name] Date of Birth:  [birth year] Medical Record Number:  [id number]   HPI: [name] is a 69 year old Caucasian male seen today for initial office follow-up visit following hospital consultation for stroke.  History is obtained from the patient, review of electronic medical records and I personally reviewed available pertinent imaging films in PACS.  He has past medical history of hyperlipidemia, hypertension, COPD, atrial fibrillation on Eliquis who presented to [facility] on 06/02/2020 with 3-day history of intermittent slurring of speech with 5 brief episodes.  Patient was supposed to be on Eliquis but admitted that he had missed a few doses in the prior week to his symptoms.  CT scan that was unremarkable MRI scan showed patchy left frontal MCA branch infarcts.  MRI of the brain showed no large vessel stenosis and carotid ultrasound was unremarkable.  2D echo also showed normal ejection fraction.  LDL cholesterol 41 mg percent and hemoglobin A1c was borderline at 6.4.  Patient states is done well since then he has been compliant with Eliquis and not missed a single dose.  He has had no further recurrent stroke or TIA symptoms.  He has been having some intermittent cough possibly from pneumonia versus bronchitis and following a bout of coughing is noted some transient numbness on the side of his right neck and shoulder which does not stay long.  He quit smoking 4 years ago.  Is tolerating his blood pressure medications and blood pressure today is 109/70.  He remains on Lipitor 80 mg and tolerating it well with muscle aches and pains.  He has no complaints today. ROS:   14 system review of systems is positive for speech difficulties only all other systems negative  PMH:  Past Medical History:  Diagnosis Date  . COPD (chronic obstructive pulmonary disease)  (HCC)   . Disturbance of skin sensation 06/24/2013  . Dysrhythmia   . Elevated cholesterol   . History of echocardiogram    Echo 4/17: EF 60-65%, no RWMA, Gr 2 DD  . Hypertension   . Sleep disturbance, unspecified 06/24/2013    Social History:  Social History   Socioeconomic History  . Marital status: Married    Spouse name: Not on file  . Number of children: 2  . Years of education: COLLEGE  . Highest education level: Not on file  Occupational History  . Occupation: retired  Tobacco Use  . Smoking status: Former Smoker    Packs/day: 1.50    Years: 45.00    Pack years: 67.50    Types: Cigarettes    Quit date: 11/19/2015    Years since quitting: 4.5  . Smokeless tobacco: Never Used  . Tobacco comment: pt quit smoking!! 11/19/15  Vaping Use  . Vaping Use: Never used  Substance and Sexual Activity  . Alcohol use: No    Alcohol/week: 0.0 standard drinks  . Drug use: No  . Sexual activity: Not on file  Other Topics Concern  . Not on file  Social History Narrative  . Not on file   Social Determinants of Health   Financial Resource Strain:   . Difficulty of Paying Living Expenses: Not on file  Food Insecurity:   . Worried About Programme researcher, broadcasting/film/video in the Last Year: Not on file  . Ran Out of Food in the Last Year: Not on file  Transportation  Needs:   . Lack of Transportation (Medical): Not on file  . Lack of Transportation (Non-Medical): Not on file  Physical Activity:   . Days of Exercise per Week: Not on file  . Minutes of Exercise per Session: Not on file  Stress:   . Feeling of Stress : Not on file  Social Connections:   . Frequency of Communication with Friends and Family: Not on file  . Frequency of Social Gatherings with Friends and Family: Not on file  . Attends Religious Services: Not on file  . Active Member of Clubs or Organizations: Not on file  . Attends Banker Meetings: Not on file  . Marital Status: Not on file  Intimate Partner  Violence:   . Fear of Current or Ex-Partner: Not on file  . Emotionally Abused: Not on file  . Physically Abused: Not on file  . Sexually Abused: Not on file    Medications:   Current Outpatient Medications on File Prior to Visit  Medication Sig Dispense Refill  . atorvastatin (LIPITOR) 80 MG tablet TAKE 1 TABLET BY MOUTH EVERY DAY 90 tablet 2  . Cyanocobalamin 1000 MCG CAPS Take 1,000 mcg by mouth daily.    [name] diltiazem (CARDIZEM CD) 120 MG 24 hr capsule Take 1 capsule (120 mg total) by mouth at bedtime. 90 capsule 3  . ELIQUIS 5 MG TABS tablet TAKE 1 TABLET BY MOUTH TWICE DAILY( OVERDUE FOR FOLLOW UP, NEEDS TO SCHEDULE FOR FURTHER REFILLS) (Patient taking differently: Take 5 mg by mouth 2 (two) times daily. ) 60 tablet 6  . fexofenadine (ALLEGRA) 180 MG tablet Take 180 mg by mouth daily.    . fluticasone (FLONASE) 50 MCG/ACT nasal spray Place 2 sprays into both nostrils 2 (two) times daily. 16 g 3  . Fluticasone-Umeclidin-Vilant (TRELEGY ELLIPTA) 100-62.5-25 MCG/INH AEPB Inhale 1 puff into the lungs daily. 1 each 11  . ipratropium-albuterol (DUONEB) 0.5-2.5 (3) MG/3ML SOLN Take 3 mLs by nebulization 3 (three) times daily. 360 mL 11  . losartan (COZAAR) 50 MG tablet TAKE 1 TABLET(50 MG) BY MOUTH DAILY (Patient taking differently: Take 50 mg by mouth daily. ) 90 tablet 3  . Respiratory Therapy Supplies (FLUTTER) DEVI 1 Device by Does not apply route in the morning and at bedtime. 1 each 0  . VENTOLIN HFA 108 (90 Base) MCG/ACT inhaler INHALE 2 PUFFS BY MOUTH EVERY 6 HOURS AS NEEDED FOR WHEEZING OR SHORTNESS OF BREATH (Patient taking differently: Inhale 2 puffs into the lungs every 6 (six) hours as needed for wheezing or shortness of breath. ) 54 g 2  . nitroGLYCERIN (NITROSTAT) 0.4 MG SL tablet Place 1 tablet (0.4 mg total) under the tongue every 5 (five) minutes as needed for chest pain. 25 tablet 2   No current facility-administered medications on file prior to visit.    Allergies:    Allergies  Allergen Reactions  . Morphine And Related     HALLUCINATIONS    Physical Exam General: Mildly obese middle-aged Caucasian male, seated, in no evident distress Head: head normocephalic and atraumatic.  Neck: supple with no carotid or supraclavicular bruits Cardiovascular: regular rate and rhythm, no murmurs Musculoskeletal: no deformity Skin:  no rash/petichiae Vascular:  Normal pulses all extremities Vitals:   06/23/20 1053  BP: 109/70  Pulse: 87   Neurologic Exam Mental Status: Awake and fully alert. Oriented to place and time. Recent and remote memory intact. Attention span, concentration and fund of knowledge appropriate. Mood and affect appropriate.  Cranial Nerves: Fundoscopic exam reveals sharp disc margins. Pupils equal, briskly reactive to light. Extraocular movements full without nystagmus. Visual fields full to confrontation. Hearing intact. Facial sensation intact. Face, tongue, palate moves normally and symmetrically.  Motor: Normal bulk and tone. Normal strength in all tested extremity muscles. Sensory.: intact to touch ,pinprick .position and vibratory sensation.  Coordination: Rapid alternating movements normal in all extremities. Finger-to-nose and heel-to-shin performed accurately bilaterally. Gait and Station: Arises from chair without difficulty. Stance is normal. Gait demonstrates normal stride length and balance .  Unable to to heel, toe and tandem walk without difficulty.  Reflexes: 1+ and symmetric. Toes downgoing.   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  Additional vascular risk factors of hypertension and hyperlipidemia     PLAN: I had a long d/w patient about his recent embolic stroke,atrial fibrillation, risk for recurrent stroke/TIAs, personally independently reviewed imaging studies and  stroke evaluation results and answered questions.Continue Eliquis (apixaban) daily  for secondary stroke prevention and maintain strict control of hypertension with blood pressure goal below 130/90, diabetes with hemoglobin A1c goal below 6.5% and lipids with LDL cholesterol goal below 70 mg/dL. I also advised the patient to eat a healthy diet with plenty of whole grains, cereals, fruits and vegetables, exercise regularly and maintain ideal body weight.  I stressed the need for medication compliance and advised him to eat healthy and lose weight and be more active.  Followup in the future with my nurse practitioner Shanda Bumps in 6 months or call earlier if necessary. Greater than 50% of time during this 25 minute visit was spent on counseling,explanation of diagnosis embolic stroke and atrial fibrillation, planning of further management, discussion with patient and family and coordination of care Delia Heady, MD  [facility] [address] [place], [place] [address]  Phone [phone] Fax [phone] Note: This document was prepared with digital dictation and possible smart phrase technology. Any transcriptional errors that result from this process are unintentional

## 2020-06-28 DIAGNOSIS — Z23 Encounter for immunization: Secondary | ICD-10-CM | POA: Diagnosis not present

## 2020-07-04 DIAGNOSIS — J449 Chronic obstructive pulmonary disease, unspecified: Secondary | ICD-10-CM | POA: Diagnosis not present

## 2020-07-28 ENCOUNTER — Other Ambulatory Visit: Payer: Self-pay | Admitting: Cardiology

## 2020-07-28 NOTE — Telephone Encounter (Signed)
Eliquis 5mg  refill request received. Patient is 69 years old, weight-99.8kg, Crea-1.02 on 06/01/2020, Diagnosis-Afib, and last seen by 08/01/2020 on 06/16/2020. Dose is appropriate based on dosing criteria. Will send in refill to requested pharmacy.

## 2020-08-02 ENCOUNTER — Inpatient Hospital Stay: Payer: Medicare Other | Admitting: Neurology

## 2020-08-04 DIAGNOSIS — J449 Chronic obstructive pulmonary disease, unspecified: Secondary | ICD-10-CM | POA: Diagnosis not present

## 2020-09-01 ENCOUNTER — Telehealth: Payer: Self-pay | Admitting: *Deleted

## 2020-09-01 NOTE — Telephone Encounter (Signed)
ct of chest Received: Today Janett Billow, LPN Spoke with Misty Stanley to schedule - per her patient is having Lung scan done by Wayne Sever in the Lung Screening program.  She ask me to cancel this order since they are following the patient.

## 2020-09-03 DIAGNOSIS — J449 Chronic obstructive pulmonary disease, unspecified: Secondary | ICD-10-CM | POA: Diagnosis not present

## 2020-09-05 NOTE — Telephone Encounter (Signed)
SG please advise. Thanks   

## 2020-09-13 ENCOUNTER — Ambulatory Visit: Payer: Medicare Other | Admitting: Cardiology

## 2020-09-13 ENCOUNTER — Other Ambulatory Visit: Payer: Self-pay

## 2020-09-13 ENCOUNTER — Ambulatory Visit (INDEPENDENT_AMBULATORY_CARE_PROVIDER_SITE_OTHER)
Admission: RE | Admit: 2020-09-13 | Discharge: 2020-09-13 | Disposition: A | Discharge status: Home / Self Care | Payer: Medicare Other | Source: Ambulatory Visit | Attending: Acute Care | Admitting: Acute Care

## 2020-09-13 ENCOUNTER — Encounter: Payer: Self-pay | Admitting: Cardiology

## 2020-09-13 VITALS — BP 140/82 | HR 71 | Ht 69.0 in | Wt 220.4 lb

## 2020-09-13 DIAGNOSIS — I10 Essential (primary) hypertension: Secondary | ICD-10-CM

## 2020-09-13 DIAGNOSIS — I63419 Cerebral infarction due to embolism of unspecified middle cerebral artery: Secondary | ICD-10-CM | POA: Diagnosis not present

## 2020-09-13 DIAGNOSIS — Z87891 Personal history of nicotine dependence: Secondary | ICD-10-CM

## 2020-09-13 DIAGNOSIS — E785 Hyperlipidemia, unspecified: Secondary | ICD-10-CM | POA: Diagnosis not present

## 2020-09-13 DIAGNOSIS — I480 Paroxysmal atrial fibrillation: Secondary | ICD-10-CM

## 2020-09-13 MED ORDER — DILTIAZEM HCL ER COATED BEADS 180 MG PO CP24: 180.0000 mg | ORAL_CAPSULE | Freq: Every day | ORAL | 2 refills | Status: DC

## 2020-09-13 NOTE — Progress Notes (Signed)
Cardiology Office Note:    Date:  09/13/2020   ID:  Nicholas Camacho, DOB Apr 15, 1951, MRN [id number]  PCP:  Farris Has, MD  Cardiologist:  Tobias Alexander, MD  Electrophysiologist:  None   Referring MD: Farris Has, MD   Reason for visit: Posthospitalization follow-up, paroxysmal atrial fibrillation, hypertension  History of Present Illness:    Nicholas Camacho is a 69 y.o. male with a hx of PAF chads BASC equals 3 on Eliquis (had GI bleed on Xarelto), hypertension, HLD, COPD, OSA, small AAA and iliac aneurysm.  Coronary CTA 08/20/17 for mild fatigue on exertion showed calcium score of 3244 which was the 99th percentile for age and sex.  Moderate stenosis in the LAD and RCA with suspicion for severe stenosis in the mid RCA.  Cardiac catheterization 08/30/17 showed moderate nonobstructive calcific stenosis in the RCA with mild nonobstructive calcific stenosis in the LAD and circumflex normal LV function.  Medical management was recommended as well as smoking cessation.  See below for complete details.  06/19/2019 - 18 months follow up, he is doing well from cardiac standpoint, he has retired a year ago and helps with his grandkids. He has been evaluated for hematochezia, underwent colonoscopy with findings of hemorrhoids only. Dr Kateri Plummer obtained his labs and he has no anemia (we will obtain those). He also underwent chest CTA that showed:  1. Lung-RADS 4A, suspicious. Follow up low-dose chest CT without contrast in 3 months (please use the following order, "CT CHEST LCS NODULE FOLLOW-UP W/O CM") is recommended. 2. Clustered tree-in-bud nodularity in the left upper lobe/lingula and medial right lower lobe, favoring infection. Dominant lingular nodules measure up to 9.8 mm. He has been started on Augmentin for presumed pneumonia. Denies chest pain, LE edema, he is complaint with his meds.  09/13/2020 -the patient is coming after 6 months, unfortunately he had a stroke and was  hospitalized in September 2021, this was secondary to missed doses of Eliquis.  His head CT did not show any changes other than chronic microvascular changes.  Brain MRI showed Cluster of small acute infarctions at the left frontoparietal junction region, consistent with left MCA branch vessel insult. No large confluent infarction. No swelling or hemorrhage. 2. Mild to moderate chronic small-vessel ischemic changes of the cerebral hemispheric white matter.  He is recovering from his stroke, he also struggled with COPD, otherwise denies any chest pain shortness of breath, no palpitation dizziness syncope or falls.  He is active around his house and now very strict about taking his medications on time.   Past Medical History:  Diagnosis Date  . COPD (chronic obstructive pulmonary disease) (HCC)   . Disturbance of skin sensation 06/24/2013  . Dysrhythmia   . Elevated cholesterol   . History of echocardiogram    Echo 4/17: EF 60-65%, no RWMA, Gr 2 DD  . Hypertension   . Sleep disturbance, unspecified 06/24/2013    Past Surgical History:  Procedure Laterality Date  . ESOPHAGOGASTRODUODENOSCOPY N/A 01/08/2016   Procedure: ESOPHAGOGASTRODUODENOSCOPY (EGD);  Surgeon: Carman Ching, MD;  Location: [facility] ENDOSCOPY;  Service: Endoscopy;  Laterality: N/A;  . fractured pelvis    . INGUINAL HERNIA REPAIR Bilateral   . LEFT HEART CATH AND CORONARY ANGIOGRAPHY N/A 08/30/2017   Procedure: LEFT HEART CATH AND CORONARY ANGIOGRAPHY;  Surgeon: Kathleene Hazel, MD;  Location: [facility] INVASIVE CV LAB;  Service: Cardiovascular;  Laterality: N/A;  . reconstruct left heel  2002  . TONSILLECTOMY     Current Medications: Current  Meds  Medication Sig  . apixaban (ELIQUIS) 5 MG TABS tablet Take 1 tablet (5 mg total) by mouth 2 (two) times daily.  [name] atorvastatin (LIPITOR) 80 MG tablet TAKE 1 TABLET BY MOUTH EVERY DAY  . Cyanocobalamin 1000 MCG CAPS Take 1,000 mcg by mouth daily.  . fluticasone (FLONASE) 50 MCG/ACT  nasal spray Place 2 sprays into both nostrils 2 (two) times daily.  . Fluticasone-Umeclidin-Vilant (TRELEGY ELLIPTA) 100-62.5-25 MCG/INH AEPB Inhale 1 puff into the lungs daily.  [name] ipratropium-albuterol (DUONEB) 0.5-2.5 (3) MG/3ML SOLN Take 3 mLs by nebulization 3 (three) times daily.  [name] losartan (COZAAR) 50 MG tablet TAKE 1 TABLET(50 MG) BY MOUTH DAILY  . Respiratory Therapy Supplies (FLUTTER) DEVI 1 Device by Does not apply route in the morning and at bedtime.  . VENTOLIN HFA 108 (90 Base) MCG/ACT inhaler INHALE 2 PUFFS BY MOUTH EVERY 6 HOURS AS NEEDED FOR WHEEZING OR SHORTNESS OF BREATH  . [DISCONTINUED] diltiazem (CARDIZEM CD) 120 MG 24 hr capsule Take 1 capsule (120 mg total) by mouth at bedtime.    Allergies:   Morphine and related   Social History   Socioeconomic History  . Marital status: Married    Spouse name: Not on file  . Number of children: 2  . Years of education: COLLEGE  . Highest education level: Not on file  Occupational History  . Occupation: retired  Tobacco Use  . Smoking status: Former Smoker    Packs/day: 1.50    Years: 45.00    Pack years: 67.50    Types: Cigarettes    Quit date: 11/19/2015    Years since quitting: 4.8  . Smokeless tobacco: Never Used  . Tobacco comment: pt quit smoking!! 11/19/15  Vaping Use  . Vaping Use: Never used  Substance and Sexual Activity  . Alcohol use: No    Alcohol/week: 0.0 standard drinks  . Drug use: No  . Sexual activity: Not on file  Other Topics Concern  . Not on file  Social History Narrative  . Not on file   Social Determinants of Health   Financial Resource Strain: Not on file  Food Insecurity: Not on file  Transportation Needs: Not on file  Physical Activity: Not on file  Stress: Not on file  Social Connections: Not on file    Family History: The patient's family history includes Alcoholism in his sister; Diabetes in his mother; Drug abuse in his sister; Kidney failure in his father; Prostate cancer in  his father. There is no history of Stroke.  ROS:   Please see the history of present illness.    All other systems reviewed and are negative.  EKGs/Labs/Other Studies Reviewed:    The following studies were reviewed today:  EKG:  EKG is ordered today.  The ekg ordered today demonstrates atrial fibrillation with RVR, 127 bpm, previously in sinus rhythm, this was personally reviewed.  TTE 11/26/2019  1. Left ventricular ejection fraction, by estimation, is 55 to 60%. The  left ventricle has normal function. The left ventricle has no regional  wall motion abnormalities. Left ventricular diastolic parameters are  consistent with Grade I diastolic  dysfunction (impaired relaxation).  2. Right ventricular systolic function is normal. The right ventricular  size is normal. Tricuspid regurgitation signal is inadequate for assessing  PA pressure.  3. The mitral valve is grossly normal. No evidence of mitral valve  regurgitation. No evidence of mitral stenosis.  4. The aortic valve is tricuspid. Aortic valve regurgitation is mild. No  aortic stenosis is present. Aortic regurgitation PHT measures 658 msec.  5. Aortic dilatation noted. There is mild dilatation of the aortic root  measuring 41 mm.  6. The inferior vena cava is normal in size with greater than 50%  respiratory variability, suggesting right atrial pressure of 3 mmHg.   Recent Labs: 11/23/2019: NT-Pro BNP 70 06/01/2020: ALT 21; BUN 12; Creatinine, Ser 1.02; Hemoglobin 14.2; Platelets 265; Potassium 4.2; Sodium 139 06/02/2020: TSH 3.962  Recent Lipid Panel    Component Value Date/Time   CHOL 88 06/03/2020 1400   CHOL 79 (L) 11/23/2019 1156   TRIG 82 06/03/2020 1400   HDL 24 (L) 06/03/2020 1400   HDL 25 (L) 11/23/2019 1156   CHOLHDL 3.7 06/03/2020 1400   VLDL 16 06/03/2020 1400   LDLCALC 48 06/03/2020 1400   LDLCALC 41 11/23/2019 1156   Physical Exam:    VS:  BP 140/82   Pulse 71   Ht 5\' 9"  (1.753 m)   Wt 220 lb  6.4 oz (100 kg)   SpO2 92%   BMI 32.55 kg/m     Wt Readings from Last 3 Encounters:  09/13/20 220 lb 6.4 oz (100 kg)  06/23/20 220 lb (99.8 kg)  06/16/20 222 lb (100.7 kg)     GEN:  Well nourished, well developed in no acute distress HEENT: Normal NECK: No JVD; No carotid bruits LYMPHATICS: No lymphadenopathy CARDIAC: iRRR, no murmurs, rubs, gallops RESPIRATORY:  Clear to auscultation without rales, wheezing or rhonchi  ABDOMEN: Soft, non-tender, non-distended MUSCULOSKELETAL:  No edema; No deformity  SKIN: Warm and dry NEUROLOGIC:  Alert and oriented x 3 PSYCHIATRIC:  Normal affect    ASSESSMENT:    1. PAF (paroxysmal atrial fibrillation) (HCC)   2. Essential hypertension   3. Hyperlipidemia, unspecified hyperlipidemia type   4. Cerebrovascular accident (CVA) due to embolism of middle cerebral artery, unspecified blood vessel laterality (HCC)   5. Former smoker    PLAN:    In order of problems listed above:  1. Paroxysmal atrial fibrillation -in sinus rhythm today, on Eliquis with normal hemoglobin of 14.3 in September 2021.  Unfortunately he missed some doses of Eliquis and had an ischemic stroke in September 2021, he has been compliant since then. 2. Shortness of breath and dyspnea on exertion -secondary to COPD, he is already on Trelegy and DuoNeb.   3. CAD with moderate nonobstructive disease and CAD, mild in LAD and circumflex, he is quit smoking 4 years ago.  We will continue atorvastatin, Eliquis, and losartan. 4. Hyperlipidemia on Lipitor 80 mg daily, will repeat lipids and liver function test today. 5. Hypertension -elevated in recent stroke I will increase his Cardizem CD from 120 to 180 mg daily. 6. Lung nodule -follow-up chest CT today. 7. Ascending aortic aneurysm measuring 41 mm on most recent echo in February 2021, will repeat annually.  Medication Adjustments/Labs and Tests Ordered: Current medicines are reviewed at length with the patient today.   Concerns regarding medicines are outlined above.  Orders Placed This Encounter  Procedures  . EKG 12-Lead   Meds ordered this encounter  Medications  . diltiazem (CARDIZEM CD) 180 MG 24 hr capsule    Sig: Take 1 capsule (180 mg total) by mouth daily.    Dispense:  90 capsule    Refill:  2    Increased dose   Patient Instructions  Medication Instructions:   INCREASE YOUR CARDIZEM CD TO 180 MG BY MOUTH DAILY  *If you need a refill  on your cardiac medications before your next appointment, please call your pharmacy*   Follow-Up: At [facility], you and your health needs are our priority.  As part of our continuing mission to provide you with exceptional heart care, we have created designated Provider Care Teams.  These Care Teams include your primary Cardiologist (physician) and Advanced Practice Providers (APPs -  Physician Assistants and Nurse Practitioners) who all work together to provide you with the care you need, when you need it.  We recommend signing up for the patient portal called "MyChart".  Sign up information is provided on this After Visit Summary.  MyChart is used to connect with patients for Virtual Visits (Telemedicine).  Patients are able to view lab/test results, encounter notes, upcoming appointments, etc.  Non-urgent messages can be sent to your provider as well.   To learn more about what you can do with MyChart, go to [email or web address].    Your next appointment:   6 month(s)  The format for your next appointment:   In Person  Provider:   Tobias AlexanderKatarina Thula Stewart, MD        Signed, Tobias AlexanderKatarina Marylin Lathon, MD  09/13/2020 10:57 AM    [facility]

## 2020-09-13 NOTE — Patient Instructions (Signed)
Medication Instructions:   INCREASE YOUR CARDIZEM CD TO 180 MG BY MOUTH DAILY  *If you need a refill on your cardiac medications before your next appointment, please call your pharmacy*   Follow-Up: At [facility], you and your health needs are our priority.  As part of our continuing mission to provide you with exceptional heart care, we have created designated Provider Care Teams.  These Care Teams include your primary Cardiologist (physician) and Advanced Practice Providers (APPs -  Physician Assistants and Nurse Practitioners) who all work together to provide you with the care you need, when you need it.  We recommend signing up for the patient portal called "MyChart".  Sign up information is provided on this After Visit Summary.  MyChart is used to connect with patients for Virtual Visits (Telemedicine).  Patients are able to view lab/test results, encounter notes, upcoming appointments, etc.  Non-urgent messages can be sent to your provider as well.   To learn more about what you can do with MyChart, go to [email or web address].    Your next appointment:   6 month(s)  The format for your next appointment:   In Person  Provider:   Tobias Alexander, MD

## 2020-09-20 DIAGNOSIS — I10 Essential (primary) hypertension: Secondary | ICD-10-CM | POA: Diagnosis not present

## 2020-09-20 DIAGNOSIS — E785 Hyperlipidemia, unspecified: Secondary | ICD-10-CM | POA: Diagnosis not present

## 2020-09-20 DIAGNOSIS — J449 Chronic obstructive pulmonary disease, unspecified: Secondary | ICD-10-CM | POA: Diagnosis not present

## 2020-09-20 DIAGNOSIS — E1169 Type 2 diabetes mellitus with other specified complication: Secondary | ICD-10-CM | POA: Diagnosis not present

## 2020-09-26 ENCOUNTER — Other Ambulatory Visit: Payer: Self-pay | Admitting: *Deleted

## 2020-09-26 DIAGNOSIS — Z87891 Personal history of nicotine dependence: Secondary | ICD-10-CM

## 2020-09-26 NOTE — Progress Notes (Signed)
I have called the patient with the results of his low dose CT. I explained they his scan was read as a Lung RADS 4 A : suspicious findings, either short term follow up in 3 months or alternatively  PET Scan evaluation may be considered when there is a solid component of  8 mm or larger. I discussed the scan with Dr. Tonia Brooms, who agreed wit the 3 month follow up. The patient states he has had a flare of his COPD recently which mat have contributed to his COPD finding. The patient has had PFT's recently. 2021: Moderate obstruction without bronchodilator reversibility. Significant air trapping without hyperinflation.  Mildly reduced diffusion.  Angelique Blonder, please place order for 3 month follow up, and fax results to PCP . Thanks so much.

## 2020-09-27 ENCOUNTER — Other Ambulatory Visit: Payer: Self-pay | Admitting: Pulmonary Disease

## 2020-09-27 DIAGNOSIS — J449 Chronic obstructive pulmonary disease, unspecified: Secondary | ICD-10-CM

## 2020-11-15 ENCOUNTER — Other Ambulatory Visit: Payer: Self-pay | Admitting: Cardiology

## 2020-11-25 ENCOUNTER — Other Ambulatory Visit: Payer: Self-pay | Admitting: Cardiology

## 2020-11-25 ENCOUNTER — Other Ambulatory Visit: Payer: Self-pay | Admitting: Critical Care Medicine

## 2020-11-25 ENCOUNTER — Telehealth: Payer: Self-pay | Admitting: Acute Care

## 2020-11-25 NOTE — Telephone Encounter (Signed)
Denise I see where you placed an order for the CT scan.  Pt is wanting to set this up for March?   Is this when he needs to come in?  thanks

## 2020-11-28 NOTE — Telephone Encounter (Signed)
I have left a message for the patient to return my call to schedule his CT [phone]

## 2020-11-28 NOTE — Telephone Encounter (Signed)
Will forward to [place] to contact pt to get CT scheduled.

## 2020-11-30 NOTE — Telephone Encounter (Signed)
I have spoke with Nicholas Camacho and his LCS CT has been scheduled at [facility] on 12/07/20 @ 4:00pm. He is aware of the appt

## 2020-12-07 ENCOUNTER — Other Ambulatory Visit: Payer: Self-pay

## 2020-12-07 ENCOUNTER — Ambulatory Visit (INDEPENDENT_AMBULATORY_CARE_PROVIDER_SITE_OTHER)
Admission: RE | Admit: 2020-12-07 | Discharge: 2020-12-07 | Disposition: A | Discharge status: Home / Self Care | Payer: Medicare Other | Source: Ambulatory Visit | Attending: Cardiovascular Disease | Admitting: Cardiovascular Disease

## 2020-12-07 DIAGNOSIS — Z87891 Personal history of nicotine dependence: Secondary | ICD-10-CM | POA: Diagnosis not present

## 2020-12-07 DIAGNOSIS — Z122 Encounter for screening for malignant neoplasm of respiratory organs: Secondary | ICD-10-CM

## 2020-12-07 DIAGNOSIS — J432 Centrilobular emphysema: Secondary | ICD-10-CM | POA: Diagnosis not present

## 2020-12-07 DIAGNOSIS — J9811 Atelectasis: Secondary | ICD-10-CM | POA: Diagnosis not present

## 2020-12-07 DIAGNOSIS — I2510 Atherosclerotic heart disease of native coronary artery without angina pectoris: Secondary | ICD-10-CM | POA: Diagnosis not present

## 2020-12-07 DIAGNOSIS — J984 Other disorders of lung: Secondary | ICD-10-CM | POA: Diagnosis not present

## 2020-12-13 NOTE — Progress Notes (Signed)
Please call patient and let them  know their  low dose Ct was read as a Lung RADS 2: nodules that are benign in appearance and behavior with a very low likelihood of becoming a clinically active cancer due to size or lack of growth. Recommendation per radiology is for a repeat LDCT in 12 months. .Please let them  know we will order and schedule their  annual screening scan for 11/2021 Please let them  know there was notation of CAD on their  scan.  Please remind the patient  that this is a non-gated exam therefore degree or severity of disease  cannot be determined. Please have them  follow up with their PCP regarding potential risk factor modification, dietary therapy or pharmacologic therapy if clinically indicated. Pt.  is  currently on statin therapy. Please place order for annual  screening scan for  11/2021 and fax results to PCP. Thanks so much.  Dr. 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 You see the patient 12/16/2020. Thanks so much

## 2020-12-14 ENCOUNTER — Other Ambulatory Visit: Payer: Self-pay | Admitting: *Deleted

## 2020-12-14 DIAGNOSIS — Z87891 Personal history of nicotine dependence: Secondary | ICD-10-CM

## 2020-12-16 ENCOUNTER — Encounter: Payer: Self-pay | Admitting: Pulmonary Disease

## 2020-12-16 ENCOUNTER — Ambulatory Visit: Payer: Medicare Other | Admitting: Pulmonary Disease

## 2020-12-16 ENCOUNTER — Other Ambulatory Visit: Payer: Self-pay

## 2020-12-16 VITALS — BP 124/76 | HR 59 | Temp 97.5°F | Ht 69.0 in | Wt 221.6 lb

## 2020-12-16 DIAGNOSIS — J441 Chronic obstructive pulmonary disease with (acute) exacerbation: Secondary | ICD-10-CM

## 2020-12-16 MED ORDER — PREDNISONE 10 MG PO TABS: 10.0000 mg | ORAL_TABLET | Freq: Every day | ORAL | 0 refills | Status: DC

## 2020-12-16 MED ORDER — AZITHROMYCIN 250 MG PO TABS: ORAL_TABLET | ORAL | 0 refills | Status: DC

## 2020-12-16 NOTE — Patient Instructions (Signed)
Start prednisone 40mg  daily for 5 days  Start Z-pack for 5 days.  Continue trelegy ellipta daily  Continue as needed albuterol

## 2020-12-16 NOTE — Progress Notes (Signed)
Synopsis: Referred in 2014 for COPD by Farris Has, MD.  Formerly a patient of Dr. Kendrick Fries and Dr. Chestine Spore.  Subjective:   PATIENT ID: Nicholas Camacho GENDER: male DOB: 05/23/1951, MRN: [id number]   HPI  Chief Complaint  Patient presents with  . Establish Care    Former PC patient for COPD. States his breathing has been stable since last visit. Has noticed an increase in coughing, productive with green phlegm.     Nathin Saran is a 70 year old male, former smoker with COPD and nocturnal hypoxemia who returns to clinic for follow up.   He has been on trelegy ellipta 1 puff daily and as needed albuterol. He reports an increase in his cough and sputum production over recent days. He does not notice an increase in shortness of breath.   He is being followed in the lung cancer screening clinic with Kandice Robinsons, NP. Last scan was 11/30/20 which showed stable findings compared to CT Chest on 09/13/20.  OV 06/08/20: Mr. Stfleur is a 70 year old gentleman with a history of COPD and nocturnal hypoxia who presents for follow-up.  He was recently admitted for acute stroke with full resolution of symptoms prior to discharge on 06/03/2020.  Since he left the hospital he has had increased coughing with green sputum production.  No fevers or increased dyspnea.  He has been treating his cough with Mucinex and Robitussin.  He continues to use Trelegy daily for his COPD.  His shortness of breath is similar to his baseline.  He still gets winded when he walks to the top of the stairs.  He is able to do some light yard work, but does not weed eating or mowing anymore.  With significant coughing he still notices a warm sensation going down his right upper extremity, which was one of his initial symptoms with a stroke.  His strength is fully improved.  During his hospital admission he was evaluated by SLP and did not meet criteria for formal swallow evaluation based on his symptoms.  His dysarthria is fully  resolved.  Past Medical History:  Diagnosis Date  . COPD (chronic obstructive pulmonary disease) (HCC)   . Disturbance of skin sensation 06/24/2013  . Dysrhythmia   . Elevated cholesterol   . History of echocardiogram    Echo 4/17: EF 60-65%, no RWMA, Gr 2 DD  . Hypertension   . Sleep disturbance, unspecified 06/24/2013     Family History  Problem Relation Age of Onset  . Diabetes Mother   . Prostate cancer Father   . Kidney failure Father   . Alcoholism Sister        history of  . Drug abuse Sister        history of  . Stroke Neg Hx      Social History   Socioeconomic History  . Marital status: Married    Spouse name: Not on file  . Number of children: 2  . Years of education: COLLEGE  . Highest education level: Not on file  Occupational History  . Occupation: retired  Tobacco Use  . Smoking status: Former Smoker    Packs/day: 1.50    Years: 45.00    Pack years: 67.50    Types: Cigarettes    Quit date: 11/19/2015    Years since quitting: 5.0  . Smokeless tobacco: Never Used  . Tobacco comment: pt quit smoking!! 11/19/15  Vaping Use  . Vaping Use: Never used  Substance and Sexual Activity  .  Alcohol use: No    Alcohol/week: 0.0 standard drinks  . Drug use: No  . Sexual activity: Not on file  Other Topics Concern  . Not on file  Social History Narrative  . Not on file   Social Determinants of Health   Financial Resource Strain: Not on file  Food Insecurity: Not on file  Transportation Needs: Not on file  Physical Activity: Not on file  Stress: Not on file  Social Connections: Not on file  Intimate Partner Violence: Not on file     Allergies  Allergen Reactions  . Morphine And Related     HALLUCINATIONS     Outpatient Medications Prior to Visit  Medication Sig Dispense Refill  . apixaban (ELIQUIS) 5 MG TABS tablet Take 1 tablet (5 mg total) by mouth 2 (two) times daily. 60 tablet 6  . atorvastatin (LIPITOR) 80 MG tablet TAKE 1 TABLET BY MOUTH  EVERY DAY 90 tablet 2  . cetirizine (ZYRTEC) 10 MG tablet Take 10 mg by mouth daily.    . Cyanocobalamin 1000 MCG CAPS Take 1,000 mcg by mouth daily.    [name] diltiazem (CARDIZEM CD) 180 MG 24 hr capsule Take 1 capsule (180 mg total) by mouth daily. 90 capsule 2  . fluticasone (FLONASE) 50 MCG/ACT nasal spray SHAKE LIQUID AND USE 2 SPRAYS IN EACH NOSTRIL TWICE DAILY 16 g 3  . ipratropium-albuterol (DUONEB) 0.5-2.5 (3) MG/3ML SOLN Take 3 mLs by nebulization 3 (three) times daily. 360 mL 11  . losartan (COZAAR) 50 MG tablet TAKE 1 TABLET(50 MG) BY MOUTH DAILY 90 tablet 3  . Respiratory Therapy Supplies (FLUTTER) DEVI 1 Device by Does not apply route in the morning and at bedtime. 1 each 0  . TRELEGY ELLIPTA 100-62.5-25 MCG/INH AEPB INHALE 1 PUFF INTO THE LUNGS DAILY 60 each 5  . VENTOLIN HFA 108 (90 Base) MCG/ACT inhaler INHALE 2 PUFFS BY MOUTH EVERY 6 HOURS AS NEEDED FOR WHEEZING OR SHORTNESS OF BREATH 54 g 2  . nitroGLYCERIN (NITROSTAT) 0.4 MG SL tablet Place 1 tablet (0.4 mg total) under the tongue every 5 (five) minutes as needed for chest pain. 25 tablet 2   No facility-administered medications prior to visit.    Review of Systems  Constitutional: Negative for chills, fever, malaise/fatigue and weight loss.  HENT: Negative for congestion, sinus pain and sore throat.   Eyes: Negative.   Respiratory: Positive for cough, sputum production, shortness of breath and wheezing. Negative for hemoptysis.   Cardiovascular: Negative for chest pain, palpitations, orthopnea, claudication and leg swelling.  Gastrointestinal: Negative for abdominal pain, heartburn, nausea and vomiting.  Genitourinary: Negative.   Musculoskeletal: Negative for joint pain and myalgias.  Skin: Negative for rash.  Neurological: Negative for weakness.  Endo/Heme/Allergies: Negative.   Psychiatric/Behavioral: Negative.       Objective:   Vitals:   12/16/20 1025  BP: 124/76  Pulse: (!) 59  Temp: (!) 97.5 F (36.4 C)   TempSrc: Temporal  SpO2: 96%  Weight: 221 lb 9.6 oz (100.5 kg)  Height: 5\' 9"  (1.753 m)     Physical Exam Constitutional:      General: He is not in acute distress. HENT:     Head: Normocephalic and atraumatic.     Nose: Nose normal.     Mouth/Throat:     Mouth: Mucous membranes are moist.     Pharynx: Oropharynx is clear.  Eyes:     Extraocular Movements: Extraocular movements intact.     Conjunctiva/sclera: Conjunctivae normal.  Pupils: Pupils are equal, round, and reactive to light.  Cardiovascular:     Rate and Rhythm: Normal rate and regular rhythm.     Pulses: Normal pulses.     Heart sounds: Normal heart sounds. No murmur heard.   Pulmonary:     Effort: Pulmonary effort is normal.     Breath sounds: Wheezing and rhonchi present.  Abdominal:     General: Bowel sounds are normal.     Palpations: Abdomen is soft.  Musculoskeletal:     Right lower leg: No edema.     Left lower leg: No edema.  Lymphadenopathy:     Cervical: No cervical adenopathy.  Skin:    General: Skin is warm and dry.  Neurological:     General: No focal deficit present.     Mental Status: He is alert.  Psychiatric:        Mood and Affect: Mood normal.        Behavior: Behavior normal.        Thought Content: Thought content normal.        Judgment: Judgment normal.     CBC    Component Value Date/Time   WBC 8.6 06/01/2020 1807   RBC 5.22 06/01/2020 1807   HGB 14.2 06/01/2020 1807   HGB 14.7 11/23/2019 1156   HCT 46.3 06/01/2020 1807   HCT 43.8 11/23/2019 1156   PLT 265 06/01/2020 1807   PLT 373 11/23/2019 1156   MCV 88.7 06/01/2020 1807   MCV 83 11/23/2019 1156   MCH 27.2 06/01/2020 1807   MCHC 30.7 06/01/2020 1807   RDW 14.3 06/01/2020 1807   RDW 13.2 11/23/2019 1156   LYMPHSABS 1.4 06/01/2020 1807   LYMPHSABS 1.7 11/23/2019 1156   MONOABS 0.9 06/01/2020 1807   EOSABS 0.4 06/01/2020 1807   EOSABS 0.3 11/23/2019 1156   BASOSABS 0.0 06/01/2020 1807   BASOSABS 0.0  11/23/2019 1156   BMP Latest Ref Rng & Units 06/01/2020 11/23/2019 08/27/2017  Glucose 70 - 99 mg/dL 161(W) 960(A) 92  BUN 8 - 23 mg/dL Creatinine 0.61 - 1.24 mg/dL 5.40 9.81 1.91  BUN/Creat Ratio 10 - 24 - 12 16  Sodium 135 - 145 mmol/L 139 140 141  Potassium 3.5 - 5.1 mmol/L 4.2 4.3 4.8  Chloride 98 - 111 mmol/L 104 104 102  CO2 22 - 32 mmol/L Calcium 8.9 - 10.3 mg/dL 9.5 9.3 9.6   Chest imaging: CT Chest 12/07/20 Lung-RADS 2, benign appearance or behavior. Continue annual screening with low-dose chest CT without contrast in 12 months.  Stable tree-in-bud nodularity in the upper lobes likely reflect chronic atypical mycobacterial infection or post infectious inflammatory scarring.  Aortic Atherosclerosis (ICD10-I70.0) and Emphysema (ICD10-J43.9).  PFT: PFT Results Latest Ref Rng & Units 11/18/2019 10/23/2013  FVC-Pre L 3.12 4.11  FVC-Predicted Pre % 74 93  FVC-Post L 2.85 4.12  FVC-Predicted Post % 67 94  Pre FEV1/FVC % % 53 58  Post FEV1/FCV % % 54 58  FEV1-Pre L 1.64 2.38  FEV1-Predicted Pre % 52 72  FEV1-Post L 1.54 2.41  DLCO uncorrected ml/min/mmHg 19.63 21.40  DLCO UNC% % 79 72  DLVA Predicted % 99 72  TLC L 7.38 8.64  TLC % Predicted % 111 130  RV % Predicted % 187 206    2021: Moderate obstruction without bronchodilator reversibility. Significant air trapping without hyperinflation.  Mildly reduced diffusion.  2015: Moderate obstruction with hyperinflation, no significant bronchodilator reversibility.  Mild diffusion impairment.  Echo 06/02/20: 1. Left ventricular ejection fraction, by estimation, is 55 to 60%. The  left ventricle has normal function. The left ventricle has no regional  wall motion abnormalities. Left ventricular diastolic parameters are  consistent with Grade I diastolic  dysfunction (impaired relaxation).  2. Right ventricular systolic function is normal. The right ventricular  size is normal.  3. The mitral valve is  normal in structure. Trivial mitral valve  regurgitation. No evidence of mitral stenosis.  4. The aortic valve was not well visualized. Aortic valve regurgitation  is mild. Mild aortic valve sclerosis is present, with no evidence of  aortic valve stenosis.  5. The inferior vena cava is normal in size with greater than 50%  respiratory variability, suggesting right atrial pressure of 3 mmHg.   Heart Catheterization 08/30/2017:  The left ventricular systolic function is normal.  LV end diastolic pressure is normal.  The left ventricular ejection fraction is 55-65% by visual estimate.  There is no mitral valve regurgitation.  Prox RCA lesion is 40% stenosed.  Prox RCA to Mid RCA lesion is 50% stenosed.  Mid RCA-1 lesion is 30% stenosed.  Mid RCA-2 lesion is 50% stenosed.  Prox Cx lesion is 40% stenosed.  Ost LAD to Prox LAD lesion is 30% stenosed.   1. Moderate non-obstructive, calcific stenoses in the RCA 2. Mild non-obstructive, calcific stenoses in the LAD and Circumflex 3. Normal LV systolic function  Recommendations: Medical management of CAD. Tobacco cessation.   Assessment & Plan:   COPD with acute exacerbation (HCC) - Plan: predniSONE (DELTASONE) 10 MG tablet, azithromycin (ZITHROMAX) 250 MG tablet  Discussion: Merrel Crabbe is a 70 year old male, former smoker with COPD and nocturnal hypoxemia who returns to clinic for follow up.   He currently is having a COPD exacerbation with increase in cough and sputum production along with rhonchi/wheezing on exam.  He is to start 5 days of prednisone 40mg  daily and 5 days of azithromycin.   He is to continue trelegy ellipta 1 puff daily and as needed albuterol.   He is to follow up in 1 month.  , MD [facility] Office: [phone]    Current Outpatient Medications:  .  apixaban (ELIQUIS) 5 MG TABS tablet, Take 1 tablet (5 mg total) by mouth 2 (two) times daily., Disp:  60 tablet, Rfl: 6 .  atorvastatin (LIPITOR) 80 MG tablet, TAKE 1 TABLET BY MOUTH EVERY DAY, Disp: 90 tablet, Rfl: 2 .  azithromycin (ZITHROMAX) 250 MG tablet, Take as directed, Disp: 6 tablet, Rfl: 0 .  cetirizine (ZYRTEC) 10 MG tablet, Take 10 mg by mouth daily., Disp: , Rfl:  .  Cyanocobalamin 1000 MCG CAPS, Take 1,000 mcg by mouth daily., Disp: , Rfl:  .  diltiazem (CARDIZEM CD) 180 MG 24 hr capsule, Take 1 capsule (180 mg total) by mouth daily., Disp: 90 capsule, Rfl: 2 .  fluticasone (FLONASE) 50 MCG/ACT nasal spray, SHAKE LIQUID AND USE 2 SPRAYS IN EACH NOSTRIL TWICE DAILY, Disp: 16 g, Rfl: 3 .  ipratropium-albuterol (DUONEB) 0.5-2.5 (3) MG/3ML SOLN, Take 3 mLs by nebulization 3 (three) times daily., Disp: 360 mL, Rfl: 11 .  losartan (COZAAR) 50 MG tablet, TAKE 1 TABLET(50 MG) BY MOUTH DAILY, Disp: 90 tablet, Rfl: 3 .  predniSONE (DELTASONE) 10 MG tablet, Take 1 tablet (10 mg total) by mouth daily with breakfast., Disp: 20 tablet, Rfl: 0 .  Respiratory Therapy Supplies (FLUTTER) DEVI, 1 Device by Does  not apply route in the morning and at bedtime., Disp: 1 each, Rfl: 0 .  TRELEGY ELLIPTA 100-62.5-25 MCG/INH AEPB, INHALE 1 PUFF INTO THE LUNGS DAILY, Disp: 60 each, Rfl: 5 .  VENTOLIN HFA 108 (90 Base) MCG/ACT inhaler, INHALE 2 PUFFS BY MOUTH EVERY 6 HOURS AS NEEDED FOR WHEEZING OR SHORTNESS OF BREATH, Disp: 54 g, Rfl: 2 .  nitroGLYCERIN (NITROSTAT) 0.4 MG SL tablet, Place 1 tablet (0.4 mg total) under the tongue every 5 (five) minutes as needed for chest pain., Disp: 25 tablet, Rfl: 2

## 2020-12-20 ENCOUNTER — Other Ambulatory Visit: Payer: Self-pay | Admitting: Critical Care Medicine

## 2020-12-21 ENCOUNTER — Encounter: Payer: Self-pay | Admitting: Pulmonary Disease

## 2020-12-22 ENCOUNTER — Encounter: Payer: Self-pay | Admitting: Adult Health

## 2020-12-22 ENCOUNTER — Other Ambulatory Visit: Payer: Self-pay

## 2020-12-22 ENCOUNTER — Ambulatory Visit: Payer: Medicare Other | Admitting: Adult Health

## 2020-12-22 VITALS — BP 139/79 | HR 66 | Ht 69.0 in | Wt 221.0 lb

## 2020-12-22 DIAGNOSIS — I63412 Cerebral infarction due to embolism of left middle cerebral artery: Secondary | ICD-10-CM

## 2020-12-22 NOTE — Patient Instructions (Signed)
Continue Eliquis (apixaban) daily  and atorvastatin 80mg  daily  for secondary stroke prevention  Continue to follow up with PCP regarding cholesterol and blood pressure management  Maintain strict control of hypertension with blood pressure goal below 130/90 and cholesterol with LDL cholesterol (bad cholesterol) goal below 70 mg/dL.     Followup in the future with me in 6 months or call earlier if needed      Thank you for coming to see at [facility]. I hope we have been able to provide you high quality care today.  You may receive a patient satisfaction survey over the next few weeks. We would appreciate your feedback and comments so that we may continue to improve ourselves and the health of our patients.

## 2020-12-22 NOTE — Progress Notes (Signed)
[facility] [address] [place]. [place] [address] [phone]       OFFICE FOLLOW-UP NOTE  [name] Date of Birth:  1951-03-14 Medical Record Number:  [id number]    Reason for visit: Stroke follow-up  Chief Complaint  Patient presents with  . Follow-up    RM 14 alone  Pt is well, no complaints        HPI:   Today, 12/19/2020, [name] returns for 62-month stroke follow-up unaccompanied  Mentions 2 occurrences of delayed word finding as well as mild short-term memory loss Denies any other associated symptoms or stroke/TIA symptoms Maintains ADLs and IADLs independently without difficulty Is routinely doing memory exercises at home with his family  Compliant on Eliquis 5 mg twice daily without any missed dosages (his daughter helps assist) -denies associated side effects Compliant on atorvastatin 80 mg daily -denies associated side effects Blood pressure today 139/79 - monitored at home and typically 120-130s/70s  No further concerns at this time     History provided for reference purposes only Initial visit 06/23/2020 Dr. Pearlean Camacho: [name] is a 70 year old Caucasian male seen today for initial office follow-up visit following hospital consultation for stroke.  History is obtained from the patient, review of electronic medical records and I personally reviewed available pertinent imaging films in PACS.  He has past medical history of hyperlipidemia, hypertension, COPD, atrial fibrillation on Eliquis who presented to [facility] on 06/02/2020 with 3-day history of intermittent slurring of speech with 5 brief episodes.  Patient was supposed to be on Eliquis but admitted that he had missed a few doses in the prior week to his symptoms.  CT scan that was unremarkable MRI scan showed patchy left frontal MCA branch infarcts.  MRI of the brain showed no large vessel stenosis and carotid ultrasound was unremarkable.  2D echo also showed  normal ejection fraction.  LDL cholesterol 41 mg percent and hemoglobin A1c was borderline at 6.4.  Patient states is done well since then he has been compliant with Eliquis and not missed a single dose.  He has had no further recurrent stroke or TIA symptoms.  He has been having some intermittent cough possibly from pneumonia versus bronchitis and following a bout of coughing is noted some transient numbness on the side of his right neck and shoulder which does not stay long.  He quit smoking 4 years ago.  Is tolerating his blood pressure medications and blood pressure today is 109/70.  He remains on Lipitor 80 mg and tolerating it well with muscle aches and pains.  He has no complaints today.   ROS:   14 system review of systems is positive for speech difficulties only all other systems negative  PMH:  Past Medical History:  Diagnosis Date  . COPD (chronic obstructive pulmonary disease) (HCC)   . Disturbance of skin sensation 06/24/2013  . Dysrhythmia   . Elevated cholesterol   . History of echocardiogram    Echo 4/17: EF 60-65%, no RWMA, Gr 2 DD  . Hypertension   . Sleep disturbance, unspecified 06/24/2013    Social History:  Social History   Socioeconomic History  . Marital status: Married    Spouse name: Not on file  . Number of children: 2  . Years of education: COLLEGE  . Highest education level: Not on file  Occupational History  . Occupation: retired  Tobacco Use  . Smoking status: Former Smoker    Packs/day: 1.50  Years: 45.00    Pack years: 67.50    Types: Cigarettes    Quit date: 11/19/2015    Years since quitting: 5.0  . Smokeless tobacco: Never Used  . Tobacco comment: pt quit smoking!! 11/19/15  Vaping Use  . Vaping Use: Never used  Substance and Sexual Activity  . Alcohol use: No    Alcohol/week: 0.0 standard drinks  . Drug use: No  . Sexual activity: Not on file  Other Topics Concern  . Not on file  Social History Narrative  . Not on file   Social  Determinants of Health   Financial Resource Strain: Not on file  Food Insecurity: Not on file  Transportation Needs: Not on file  Physical Activity: Not on file  Stress: Not on file  Social Connections: Not on file  Intimate Partner Violence: Not on file    Medications:   Current Outpatient Medications on File Prior to Visit  Medication Sig Dispense Refill  . albuterol (VENTOLIN HFA) 108 (90 Base) MCG/ACT inhaler INHALE 2 PUFFS INTO THE LUNGS EVERY 6 HOURS AS NEEDED FOR WHEEZING OR SHORTNESS OF BREATH 18 g 2  . apixaban (ELIQUIS) 5 MG TABS tablet Take 1 tablet (5 mg total) by mouth 2 (two) times daily. 60 tablet 6  . atorvastatin (LIPITOR) 80 MG tablet TAKE 1 TABLET BY MOUTH EVERY DAY 90 tablet 2  . azithromycin (ZITHROMAX) 250 MG tablet Take as directed 6 tablet 0  . cetirizine (ZYRTEC) 10 MG tablet Take 10 mg by mouth daily.    . Cyanocobalamin 1000 MCG CAPS Take 1,000 mcg by mouth daily.    [name] diltiazem (CARDIZEM CD) 180 MG 24 hr capsule Take 1 capsule (180 mg total) by mouth daily. 90 capsule 2  . fluticasone (FLONASE) 50 MCG/ACT nasal spray SHAKE LIQUID AND USE 2 SPRAYS IN EACH NOSTRIL TWICE DAILY 16 g 3  . ipratropium-albuterol (DUONEB) 0.5-2.5 (3) MG/3ML SOLN Take 3 mLs by nebulization 3 (three) times daily. 360 mL 11  . losartan (COZAAR) 50 MG tablet TAKE 1 TABLET(50 MG) BY MOUTH DAILY 90 tablet 3  . predniSONE (DELTASONE) 10 MG tablet Take 1 tablet (10 mg total) by mouth daily with breakfast. 20 tablet 0  . Respiratory Therapy Supplies (FLUTTER) DEVI 1 Device by Does not apply route in the morning and at bedtime. 1 each 0  . TRELEGY ELLIPTA 100-62.5-25 MCG/INH AEPB INHALE 1 PUFF INTO THE LUNGS DAILY 60 each 5  . nitroGLYCERIN (NITROSTAT) 0.4 MG SL tablet Place 1 tablet (0.4 mg total) under the tongue every 5 (five) minutes as needed for chest pain. 25 tablet 2   No current facility-administered medications on file prior to visit.    Allergies:   Allergies  Allergen Reactions   . Morphine And Related     HALLUCINATIONS    Physical Exam Today's Vitals   12/22/20 1046  BP: 139/79  Pulse: 66  Weight: 221 lb (100.2 kg)  Height: 5\' 9"  (1.753 m)   Body mass index is 32.64 kg/m.  General: Mildly obese middle-aged Caucasian male, seated, in no evident distress Head: head normocephalic and atraumatic.  Neck: supple with no carotid or supraclavicular bruits Cardiovascular: regular rate and rhythm, no murmurs Musculoskeletal: no deformity Skin:  no rash/petichiae Vascular:  Normal pulses all extremities  Neurologic Exam Mental Status: Awake and fully alert.  Fluent speech and language.  Oriented to place and time. Recent and remote memory intact with occasional mild short-term memory loss. Attention span, concentration and fund  of knowledge appropriate. Mood and affect appropriate.  Cranial Nerves: Pupils equal, briskly reactive to light. Extraocular movements full without nystagmus. Visual fields full to confrontation. Hearing intact. Facial sensation intact. Face, tongue, palate moves normally and symmetrically.  Motor: Normal bulk and tone. Normal strength in all tested extremity muscles. Sensory.: intact to touch ,pinprick .position and vibratory sensation.  Coordination: Rapid alternating movements normal in all extremities. Finger-to-nose and heel-to-shin performed accurately bilaterally. Gait and Station: Arises from chair without difficulty. Stance is normal. Gait demonstrates normal stride length and balance .  Difficulty with heel, toe and tandem walk Reflexes: 1+ and symmetric. Toes downgoing.       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  Additional vascular risk factors of hypertension and hyperlipidemia    L MCA stroke -Recovered well without residual deficits - does report 2 occurrences of delayed word finding difficulty  and occasional mild short-term memory loss.  Likely residual stroke deficit.  Advised him to continue to monitor and to notify office with any worsening.  Encouraged continued memory exercises, ensuring daily compliance with medications and managing stroke risk factors -Continue Eliquis (apixaban) daily  for secondary stroke prevention  -Discussed secondary stroke prevention measures and importance of close PCP follow-up for aggressive stroke risk factor management including HTN with BP goal<130/90 and HLD with LDL goal<70  Atrial fibrillation -On Eliquis 5 mg twice daily for CHA2DS2-VASc score of at least 5 -Understands importance of continued compliance with ensuring no dosages are missed and routine follow-up with cardiology    Follow-up in 6 months or call earlier if needed    CC:  [facility] provider: Dr. Dannielle Karvonen, Clifton Custard, MD   I spent 30 minutes of face-to-face and non-face-to-face time with patient.  This included previsit chart review, lab review, study review, order entry, electronic health record documentation, patient education and discussion regarding history of prior stroke, delayed word finding difficulty and short-term memory loss,  importance of managing stroke risk factors and answered all other questions to patient satisfaction  Ihor Austin, [facility]  [facility] [address] [place], [place] [address]  Phone [phone] Fax [phone] Note: This document was prepared with digital dictation and possible smart phrase technology. Any transcriptional errors that result from this process are unintentional.

## 2020-12-22 NOTE — Progress Notes (Signed)
I agree with the above plan 

## 2020-12-29 ENCOUNTER — Encounter: Payer: Self-pay | Admitting: Cardiology

## 2021-01-24 ENCOUNTER — Other Ambulatory Visit: Payer: Self-pay

## 2021-01-24 ENCOUNTER — Ambulatory Visit: Payer: Medicare Other | Admitting: Pulmonary Disease

## 2021-01-24 ENCOUNTER — Encounter: Payer: Self-pay | Admitting: Pulmonary Disease

## 2021-01-24 ENCOUNTER — Other Ambulatory Visit: Payer: Self-pay | Admitting: Cardiology

## 2021-01-24 VITALS — BP 126/84 | HR 71 | Temp 97.2°F | Ht 69.0 in | Wt 226.4 lb

## 2021-01-24 DIAGNOSIS — J449 Chronic obstructive pulmonary disease, unspecified: Secondary | ICD-10-CM

## 2021-01-24 DIAGNOSIS — J309 Allergic rhinitis, unspecified: Secondary | ICD-10-CM | POA: Diagnosis not present

## 2021-01-24 LAB — CBC WITH DIFFERENTIAL/PLATELET
Basophils Absolute: 0 10*3/uL (ref 0.0–0.1)
Basophils Relative: 0.4 % (ref 0.0–3.0)
Eosinophils Absolute: 0.2 10*3/uL (ref 0.0–0.7)
Eosinophils Relative: 3.1 % (ref 0.0–5.0)
HCT: 41.9 % (ref 39.0–52.0)
Hemoglobin: 13.7 g/dL (ref 13.0–17.0)
Lymphocytes Relative: 17.4 % (ref 12.0–46.0)
Lymphs Abs: 1.3 10*3/uL (ref 0.7–4.0)
MCHC: 32.6 g/dL (ref 30.0–36.0)
MCV: 84.9 fl (ref 78.0–100.0)
Monocytes Absolute: 0.7 10*3/uL (ref 0.1–1.0)
Monocytes Relative: 8.8 % (ref 3.0–12.0)
Neutro Abs: 5.4 10*3/uL (ref 1.4–7.7)
Neutrophils Relative %: 70.3 % (ref 43.0–77.0)
Platelets: 251 10*3/uL (ref 150.0–400.0)
RBC: 4.94 Mil/uL (ref 4.22–5.81)
RDW: 16 % — ABNORMAL HIGH (ref 11.5–15.5)
WBC: 7.7 10*3/uL (ref 4.0–10.5)

## 2021-01-24 MED ORDER — MONTELUKAST SODIUM 10 MG PO TABS: 10.0000 mg | ORAL_TABLET | Freq: Every day | ORAL | 11 refills | Status: DC

## 2021-01-24 MED ORDER — IPRATROPIUM BROMIDE 0.03 % NA SOLN: 2.0000 | Freq: Two times a day (BID) | NASAL | 12 refills | Status: DC

## 2021-01-24 NOTE — Patient Instructions (Addendum)
Start montelukast 10mg  daily for allergies and breathing  Use flonase 2 sprays per day per nostril  Start using ipratropium nasal spray 2 sprays twice daily per nostril  Continue with trelegy ellipta 1 puff daily  Continue using albuterol 1-2 puffs every 4-6 hours as needed.   We will check lab work today

## 2021-01-24 NOTE — Progress Notes (Signed)
Synopsis: Referred in 2014 for COPD by Farris Has, MD.  Formerly a patient of Dr. Kendrick Fries and Dr. Chestine Spore.  Subjective:   PATIENT ID: Nicholas Camacho GENDER: male DOB: Feb 14, 1951, MRN: [id number]   HPI  Chief Complaint  Patient presents with  . Follow-up    1 mo f/u for COPD. States he still has a productive cough with a greenish tint. Has started to occur at night now.     Nicholas Camacho is a 70 year old male, former smoker with COPD and nocturnal hypoxemia who returns to clinic for follow up after COPD exacerbation at last visit.   He has been doing well since last visit. He continues to have a cough. He does complain of sinus congestion and drainage.  He is currently using zyrtec and flonase for allergies.   He is using trelegy ellipta once daily and albuterol 3 times per day. He is using his duoneb nebulizer treatments twice daily.   OV 12/16/20 He has been on trelegy ellipta 1 puff daily and as needed albuterol. He reports an increase in his cough and sputum production over recent days. He does not notice an increase in shortness of breath.   He is being followed in the lung cancer screening clinic with Kandice Robinsons, NP. Last scan was 11/30/20 which showed stable findings compared to CT Chest on 09/13/20.  OV 06/08/20: He was recently admitted for acute stroke with full resolution of symptoms prior to discharge on 06/03/2020.  Since he left the hospital he has had increased coughing with green sputum production.  No fevers or increased dyspnea.  He has been treating his cough with Mucinex and Robitussin.  He continues to use Trelegy daily for his COPD.  His shortness of breath is similar to his baseline.  He still gets winded when he walks to the top of the stairs.  He is able to do some light yard work, but does not weed eating or mowing anymore.  With significant coughing he still notices a warm sensation going down his right upper extremity, which was one of his initial symptoms  with a stroke.  His strength is fully improved.  During his hospital admission he was evaluated by SLP and did not meet criteria for formal swallow evaluation based on his symptoms.  His dysarthria is fully resolved.  Past Medical History:  Diagnosis Date  . COPD (chronic obstructive pulmonary disease) (HCC)   . Disturbance of skin sensation 06/24/2013  . Dysrhythmia   . Elevated cholesterol   . History of echocardiogram    Echo 4/17: EF 60-65%, no RWMA, Gr 2 DD  . Hypertension   . Sleep disturbance, unspecified 06/24/2013     Family History  Problem Relation Age of Onset  . Diabetes Mother   . Prostate cancer Father   . Kidney failure Father   . Alcoholism Sister        history of  . Drug abuse Sister        history of  . Stroke Neg Hx      Social History   Socioeconomic History  . Marital status: Married    Spouse name: Not on file  . Number of children: 2  . Years of education: COLLEGE  . Highest education level: Not on file  Occupational History  . Occupation: retired  Tobacco Use  . Smoking status: Former Smoker    Packs/day: 1.50    Years: 45.00    Pack years: 67.50  Types: Cigarettes    Quit date: 11/19/2015    Years since quitting: 5.2  . Smokeless tobacco: Never Used  . Tobacco comment: pt quit smoking!! 11/19/15  Vaping Use  . Vaping Use: Never used  Substance and Sexual Activity  . Alcohol use: No    Alcohol/week: 0.0 standard drinks  . Drug use: No  . Sexual activity: Not on file  Other Topics Concern  . Not on file  Social History Narrative  . Not on file   Social Determinants of Health   Financial Resource Strain: Not on file  Food Insecurity: Not on file  Transportation Needs: Not on file  Physical Activity: Not on file  Stress: Not on file  Social Connections: Not on file  Intimate Partner Violence: Not on file     Allergies  Allergen Reactions  . Morphine And Related     HALLUCINATIONS     Outpatient Medications Prior to Visit   Medication Sig Dispense Refill  . albuterol (VENTOLIN HFA) 108 (90 Base) MCG/ACT inhaler INHALE 2 PUFFS INTO THE LUNGS EVERY 6 HOURS AS NEEDED FOR WHEEZING OR SHORTNESS OF BREATH 18 g 2  . apixaban (ELIQUIS) 5 MG TABS tablet Take 1 tablet (5 mg total) by mouth 2 (two) times daily. 60 tablet 6  . cetirizine (ZYRTEC) 10 MG tablet Take 10 mg by mouth daily.    . Cyanocobalamin 1000 MCG CAPS Take 1,000 mcg by mouth daily.    [name] diltiazem (CARDIZEM CD) 180 MG 24 hr capsule Take 1 capsule (180 mg total) by mouth daily. 90 capsule 2  . fluticasone (FLONASE) 50 MCG/ACT nasal spray SHAKE LIQUID AND USE 2 SPRAYS IN EACH NOSTRIL TWICE DAILY 16 g 3  . ipratropium-albuterol (DUONEB) 0.5-2.5 (3) MG/3ML SOLN Take 3 mLs by nebulization 3 (three) times daily. 360 mL 11  . losartan (COZAAR) 50 MG tablet TAKE 1 TABLET(50 MG) BY MOUTH DAILY 90 tablet 3  . Respiratory Therapy Supplies (FLUTTER) DEVI 1 Device by Does not apply route in the morning and at bedtime. 1 each 0  . TRELEGY ELLIPTA 100-62.5-25 MCG/INH AEPB INHALE 1 PUFF INTO THE LUNGS DAILY 60 each 5  . atorvastatin (LIPITOR) 80 MG tablet TAKE 1 TABLET BY MOUTH EVERY DAY 90 tablet 2  . nitroGLYCERIN (NITROSTAT) 0.4 MG SL tablet Place 1 tablet (0.4 mg total) under the tongue every 5 (five) minutes as needed for chest pain. 25 tablet 2  . azithromycin (ZITHROMAX) 250 MG tablet Take as directed 6 tablet 0  . predniSONE (DELTASONE) 10 MG tablet Take 1 tablet (10 mg total) by mouth daily with breakfast. 20 tablet 0   No facility-administered medications prior to visit.    Review of Systems  Constitutional: Negative for chills, fever, malaise/fatigue and weight loss.  HENT: Positive for congestion. Negative for sinus pain and sore throat.   Eyes: Negative.   Respiratory: Positive for cough, sputum production and shortness of breath. Negative for hemoptysis and wheezing.   Cardiovascular: Negative for chest pain, palpitations, orthopnea, claudication and leg  swelling.  Gastrointestinal: Negative for abdominal pain, heartburn, nausea and vomiting.  Genitourinary: Negative.   Musculoskeletal: Negative for joint pain and myalgias.  Skin: Negative for rash.  Neurological: Negative for weakness.  Endo/Heme/Allergies: Negative.   Psychiatric/Behavioral: Negative.       Objective:   Vitals:   01/24/21 1158  BP: 126/84  Pulse: 71  Temp: (!) 97.2 F (36.2 C)  TempSrc: Temporal  SpO2: 96%  Weight: 226 lb 6.4 oz (  102.7 kg)  Height: 5\' 9"  (1.753 m)     Physical Exam Constitutional:      General: He is not in acute distress. HENT:     Head: Normocephalic and atraumatic.     Nose: Nose normal.     Mouth/Throat:     Mouth: Mucous membranes are moist.     Pharynx: Oropharynx is clear.  Eyes:     Extraocular Movements: Extraocular movements intact.     Conjunctiva/sclera: Conjunctivae normal.     Pupils: Pupils are equal, round, and reactive to light.  Cardiovascular:     Rate and Rhythm: Normal rate and regular rhythm.     Pulses: Normal pulses.     Heart sounds: Normal heart sounds. No murmur heard.   Pulmonary:     Effort: Pulmonary effort is normal. No respiratory distress.     Breath sounds: No wheezing or rales.  Abdominal:     General: Bowel sounds are normal.     Palpations: Abdomen is soft.  Musculoskeletal:     Right lower leg: No edema.     Left lower leg: No edema.  Lymphadenopathy:     Cervical: No cervical adenopathy.  Skin:    General: Skin is warm and dry.  Neurological:     General: No focal deficit present.     Mental Status: He is alert.  Psychiatric:        Mood and Affect: Mood normal.        Behavior: Behavior normal.        Thought Content: Thought content normal.        Judgment: Judgment normal.     CBC    Component Value Date/Time   WBC 7.7 01/24/2021 1243   RBC 4.94 01/24/2021 1243   HGB 13.7 01/24/2021 1243   HGB 14.7 11/23/2019 1156   HCT 41.9 01/24/2021 1243   HCT 43.8 11/23/2019  1156   PLT 251.0 01/24/2021 1243   PLT 373 11/23/2019 1156   MCV 84.9 01/24/2021 1243   MCV 83 11/23/2019 1156   MCH 27.2 06/01/2020 1807   MCHC 32.6 01/24/2021 1243   RDW 16.0 (H) 01/24/2021 1243   RDW 13.2 11/23/2019 1156   LYMPHSABS 1.3 01/24/2021 1243   LYMPHSABS 1.7 11/23/2019 1156   MONOABS 0.7 01/24/2021 1243   EOSABS 0.2 01/24/2021 1243   EOSABS 0.3 11/23/2019 1156   BASOSABS 0.0 01/24/2021 1243   BASOSABS 0.0 11/23/2019 1156   BMP Latest Ref Rng & Units 06/01/2020 11/23/2019 08/27/2017  Glucose 70 - 99 mg/dL 528(U136(H) 132(G132(H) 92  BUN 8 - 23 mg/dL 12 13 15   Creatinine 0.61 - 1.24 mg/dL 4.011.02 0.271.13 2.530.92  BUN/Creat Ratio 10 - 24 - 12 16  Sodium 135 - 145 mmol/L 139 140 141  Potassium 3.5 - 5.1 mmol/L 4.2 4.3 4.8  Chloride 98 - 111 mmol/L 104 104 102  CO2 22 - 32 mmol/L 26 21 26   Calcium 8.9 - 10.3 mg/dL 9.5 9.3 9.6   Chest imaging: CT Chest 12/07/20 Lung-RADS 2, benign appearance or behavior. Continue annual screening with low-dose chest CT without contrast in 12 months.  Stable tree-in-bud nodularity in the upper lobes likely reflect chronic atypical mycobacterial infection or post infectious inflammatory scarring.  Aortic Atherosclerosis (ICD10-I70.0) and Emphysema (ICD10-J43.9).  PFT: PFT Results Latest Ref Rng & Units 11/18/2019 10/23/2013  FVC-Pre L 3.12 4.11  FVC-Predicted Pre % 74 93  FVC-Post L 2.85 4.12  FVC-Predicted Post % 67 94  Pre FEV1/FVC % %  53 58  Post FEV1/FCV % % 54 58  FEV1-Pre L 1.64 2.38  FEV1-Predicted Pre % 52 72  FEV1-Post L 1.54 2.41  DLCO uncorrected ml/min/mmHg 19.63 21.40  DLCO UNC% % 79 72  DLVA Predicted % 99 72  TLC L 7.38 8.64  TLC % Predicted % 111 130  RV % Predicted % 187 206    2021: Moderate obstruction without bronchodilator reversibility. Significant air trapping without hyperinflation.  Mildly reduced diffusion.  2015: Moderate obstruction with hyperinflation, no significant bronchodilator reversibility.  Mild diffusion  impairment.  Echo 06/02/20: 1. Left ventricular ejection fraction, by estimation, is 55 to 60%. The  left ventricle has normal function. The left ventricle has no regional  wall motion abnormalities. Left ventricular diastolic parameters are  consistent with Grade I diastolic  dysfunction (impaired relaxation).  2. Right ventricular systolic function is normal. The right ventricular  size is normal.  3. The mitral valve is normal in structure. Trivial mitral valve  regurgitation. No evidence of mitral stenosis.  4. The aortic valve was not well visualized. Aortic valve regurgitation  is mild. Mild aortic valve sclerosis is present, with no evidence of  aortic valve stenosis.  5. The inferior vena cava is normal in size with greater than 50%  respiratory variability, suggesting right atrial pressure of 3 mmHg.   Heart Catheterization 08/30/2017:  The left ventricular systolic function is normal.  LV end diastolic pressure is normal.  The left ventricular ejection fraction is 55-65% by visual estimate.  There is no mitral valve regurgitation.  Prox RCA lesion is 40% stenosed.  Prox RCA to Mid RCA lesion is 50% stenosed.  Mid RCA-1 lesion is 30% stenosed.  Mid RCA-2 lesion is 50% stenosed.  Prox Cx lesion is 40% stenosed.  Ost LAD to Prox LAD lesion is 30% stenosed.   1. Moderate non-obstructive, calcific stenoses in the RCA 2. Mild non-obstructive, calcific stenoses in the LAD and Circumflex 3. Normal LV systolic function  Recommendations: Medical management of CAD. Tobacco cessation.   Assessment & Plan:   Chronic obstructive pulmonary disease, unspecified COPD type (HCC) - Plan: CBC with Differential, IgE, IgE, CBC with Differential  Allergic rhinitis, unspecified seasonality, unspecified trigger - Plan: ipratropium (ATROVENT) 0.03 % nasal spray, montelukast (SINGULAIR) 10 MG tablet  Discussion: Nicholas Camacho is a 70 year old male, former smoker with COPD  and nocturnal hypoxemia who returns to clinic for follow up.   He was treated for COPD exacerbation at last office visit on 12/09/20. He has been doing better but continues with cough and sputum production. His allergies are bothering him with sinus congestion and drainage.   He is to continue flonase 2 sprays per nostril daily and start ipratropium nasal spray, 2 sprays per nostril twice daily. He is to continue zyrtec daily and we will start him on montelukast  daily.   He is to continue trelegy ellipta 1 puff daily and as needed albuterol and duoneb treatments.   He is to follow up in 3 months.  Melody Comas, MD [facility] Office: [phone]    Current Outpatient Medications:  .  albuterol (VENTOLIN HFA) 108 (90 Base) MCG/ACT inhaler, INHALE 2 PUFFS INTO THE LUNGS EVERY 6 HOURS AS NEEDED FOR WHEEZING OR SHORTNESS OF BREATH, Disp: 18 g, Rfl: 2 .  apixaban (ELIQUIS) 5 MG TABS tablet, Take 1 tablet (5 mg total) by mouth 2 (two) times daily., Disp: 60 tablet, Rfl: 6 .  cetirizine (ZYRTEC) 10 MG  tablet, Take 10 mg by mouth daily., Disp: , Rfl:  .  Cyanocobalamin 1000 MCG CAPS, Take 1,000 mcg by mouth daily., Disp: , Rfl:  .  diltiazem (CARDIZEM CD) 180 MG 24 hr capsule, Take 1 capsule (180 mg total) by mouth daily., Disp: 90 capsule, Rfl: 2 .  fluticasone (FLONASE) 50 MCG/ACT nasal spray, SHAKE LIQUID AND USE 2 SPRAYS IN EACH NOSTRIL TWICE DAILY, Disp: 16 g, Rfl: 3 .  ipratropium (ATROVENT) 0.03 % nasal spray, Place 2 sprays into both nostrils every 12 (twelve) hours., Disp: 30 mL, Rfl: 12 .  ipratropium-albuterol (DUONEB) 0.5-2.5 (3) MG/3ML SOLN, Take 3 mLs by nebulization 3 (three) times daily., Disp: 360 mL, Rfl: 11 .  losartan (COZAAR) 50 MG tablet, TAKE 1 TABLET(50 MG) BY MOUTH DAILY, Disp: 90 tablet, Rfl: 3 .  montelukast (SINGULAIR) 10 MG tablet, Take 1 tablet (10 mg total) by mouth at bedtime., Disp: 30 tablet, Rfl: 11 .  Respiratory Therapy Supplies  (FLUTTER) DEVI, 1 Device by Does not apply route in the morning and at bedtime., Disp: 1 each, Rfl: 0 .  TRELEGY ELLIPTA 100-62.5-25 MCG/INH AEPB, INHALE 1 PUFF INTO THE LUNGS DAILY, Disp: 60 each, Rfl: 5 .  atorvastatin (LIPITOR) 80 MG tablet, Take 1 tablet (80 mg total) by mouth daily. Pt needs to keep upcoming appt in July for further refills, Disp: 90 tablet, Rfl: 0 .  nitroGLYCERIN (NITROSTAT) 0.4 MG SL tablet, Place 1 tablet (0.4 mg total) under the tongue every 5 (five) minutes as needed for chest pain., Disp: 25 tablet, Rfl: 2

## 2021-01-26 LAB — IGE: IgE (Immunoglobulin E), Serum: 104 kU/L (ref <=114)

## 2021-02-02 ENCOUNTER — Encounter: Payer: Self-pay | Admitting: Pulmonary Disease

## 2021-02-07 ENCOUNTER — Other Ambulatory Visit: Payer: Self-pay | Admitting: Critical Care Medicine

## 2021-02-08 ENCOUNTER — Telehealth: Payer: Self-pay | Admitting: Pulmonary Disease

## 2021-02-08 ENCOUNTER — Other Ambulatory Visit: Payer: Self-pay | Admitting: Critical Care Medicine

## 2021-02-08 NOTE — Telephone Encounter (Signed)
I called and spoke with patient regarding message. Patient stated pharmacy told patient that Dr denied refill on duonebs. In looking in chart, it looks like script was sent in to pharmacy for duonebs with 11 refills at 2pm. Patient will call phamacy and see what they say and call us back if needed. Patient verbalized understanding, nothing further needed.

## 2021-02-23 ENCOUNTER — Other Ambulatory Visit: Payer: Self-pay

## 2021-02-23 MED ORDER — ELIQUIS 5 MG PO TABS: 5.0000 mg | ORAL_TABLET | Freq: Two times a day (BID) | ORAL | 6 refills | Status: DC

## 2021-02-23 NOTE — Telephone Encounter (Signed)
Pt last saw Dr Delton See 09/13/20, last labs 06/01/20 Creat 1.02, age 70, weight 102.7kg, based on specified criteria pt is on appropriate dosage of Eliquis 5mg  BID.  Will refill rx.

## 2021-03-02 ENCOUNTER — Other Ambulatory Visit: Payer: Self-pay | Admitting: Critical Care Medicine

## 2021-03-23 ENCOUNTER — Ambulatory Visit: Payer: Medicare Other | Admitting: Cardiology

## 2021-03-30 ENCOUNTER — Other Ambulatory Visit: Payer: Self-pay

## 2021-03-30 ENCOUNTER — Other Ambulatory Visit (HOSPITAL_COMMUNITY): Payer: Self-pay

## 2021-03-30 ENCOUNTER — Telehealth: Payer: Self-pay | Admitting: Pharmacist

## 2021-03-30 ENCOUNTER — Encounter: Payer: Self-pay | Admitting: Pulmonary Disease

## 2021-03-30 ENCOUNTER — Ambulatory Visit: Payer: Medicare Other | Admitting: Pulmonary Disease

## 2021-03-30 VITALS — BP 132/76 | HR 75 | Ht 69.0 in | Wt 223.0 lb

## 2021-03-30 DIAGNOSIS — J441 Chronic obstructive pulmonary disease with (acute) exacerbation: Secondary | ICD-10-CM | POA: Diagnosis not present

## 2021-03-30 DIAGNOSIS — J449 Chronic obstructive pulmonary disease, unspecified: Secondary | ICD-10-CM | POA: Diagnosis not present

## 2021-03-30 DIAGNOSIS — J4489 Other specified chronic obstructive pulmonary disease: Secondary | ICD-10-CM

## 2021-03-30 MED ORDER — PREDNISONE 10 MG PO TABS: 10.0000 mg | ORAL_TABLET | Freq: Every day | ORAL | 0 refills | Status: DC

## 2021-03-30 MED ORDER — AZITHROMYCIN 250 MG PO TABS: ORAL_TABLET | ORAL | 0 refills | Status: DC

## 2021-03-30 NOTE — Telephone Encounter (Signed)
Received notification from [facility] regarding a prior authorization for [facility]. Authorization has been APPROVED from 03/30/2021 to 03/30/2022.   Patient can fill through [facility]: [phone]   Authorization # [id number]   Test claim revealed that insurance covers $4,405.36, leaving patient with a copay of $1,036.49. $955.49 applied to coverage gap.

## 2021-03-30 NOTE — Progress Notes (Signed)
Synopsis: Referred in 2014 for COPD by Farris Has, MD.  Formerly a patient of Dr. Kendrick Fries and Dr. Chestine Spore.  Subjective:   PATIENT ID: Nicholas Camacho GENDER: male DOB: [birth year], MRN: [id number]  HPI  Chief Complaint  Patient presents with   COPD    Increased productive cough   Nicholas Camacho is a 70 year old male, former smoker with COPD and nocturnal hypoxemia who returns to clinic for follow up.  He reports over the last couple of days he has increased cough with sputum production, wheezing and shortness of breath.  He reports he was not able to walk to his mailbox yesterday due to the dyspnea.  CBC with differential in April 2022 shows an absolute eosinophil count of 200 and has been 400 2021.   He has required frequent courses of prednisone for COPD exacerbations.  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  His sinus congestion and drainage has improved using Zyrtec, fluticasone and ipratropium nasal sprays.  He is also taking montelukast daily.  Is using Trelegy Ellipta 1 puff daily and DuoNeb nebulizer treatments twice daily with flutter valve.  Past Medical History:  Diagnosis Date   COPD (chronic obstructive pulmonary disease) (HCC)    Disturbance of skin sensation 06/24/2013   Dysrhythmia    Elevated cholesterol    History of echocardiogram    Echo 4/17: EF 60-65%, no RWMA, Gr 2 DD   Hypertension    Sleep disturbance, unspecified 06/24/2013     Family History  Problem Relation Age of Onset   Diabetes Mother    Prostate cancer Father    Kidney failure Father    Alcoholism Sister        history of   Drug abuse Sister        history of   Stroke Neg Hx      Social History   Socioeconomic History   Marital status: Married    Spouse name: Not on file   Number of children: 2   Years of education: COLLEGE   Highest education level: Not on file  Occupational History   Occupation: retired  Tobacco Use   Smoking status:  Former    Packs/day: 1.50    Years: 45.00    Pack years: 67.50    Types: Cigarettes    Quit date: 11/19/2015    Years since quitting: 5.3   Smokeless tobacco: Never   Tobacco comments:    pt quit smoking!! 11/19/15  Vaping Use   Vaping Use: Never used  Substance and Sexual Activity   Alcohol use: No    Alcohol/week: 0.0 standard drinks   Drug use: No   Sexual activity: Not on file  Other Topics Concern   Not on file  Social History Narrative   Not on file   Social Determinants of Health   Financial Resource Strain: Not on file  Food Insecurity: Not on file  Transportation Needs: Not on file  Physical Activity: Not on file  Stress: Not on file  Social Connections: Not on file  Intimate Partner Violence: Not on file     Allergies  Allergen Reactions   Morphine And Related     HALLUCINATIONS     Outpatient Medications Prior to Visit  Medication Sig Dispense Refill   apixaban (ELIQUIS) 5 MG TABS tablet Take 1 tablet (5 mg total) by mouth 2 (two) times daily. 60 tablet 6   atorvastatin (LIPITOR) 80 MG tablet Take 1 tablet (80 mg  total) by mouth daily. Pt needs to keep upcoming appt in July for further refills 90 tablet 0   cetirizine (ZYRTEC) 10 MG tablet Take 10 mg by mouth daily.     Cyanocobalamin 1000 MCG CAPS Take 1,000 mcg by mouth daily.     diltiazem (CARDIZEM CD) 180 MG 24 hr capsule Take 1 capsule (180 mg total) by mouth daily. 90 capsule 2   fluticasone (FLONASE) 50 MCG/ACT nasal spray SHAKE LIQUID AND USE 2 SPRAYS IN EACH NOSTRIL TWICE DAILY 16 g 3   ipratropium (ATROVENT) 0.03 % nasal spray Place 2 sprays into both nostrils every 12 (twelve) hours. 30 mL 12   ipratropium-albuterol (DUONEB) 0.5-2.5 (3) MG/3ML SOLN USE 1 VIAL VIA NEBULIZER(3ML) THREE TIMES DAILY 360 mL 11   losartan (COZAAR) 50 MG tablet TAKE 1 TABLET(50 MG) BY MOUTH DAILY 90 tablet 3   montelukast (SINGULAIR) 10 MG tablet Take 1 tablet (10 mg total) by mouth at bedtime. 30 tablet 11    Respiratory Therapy Supplies (FLUTTER) DEVI 1 Device by Does not apply route in the morning and at bedtime. 1 each 0   TRELEGY ELLIPTA 100-62.5-25 MCG/INH AEPB INHALE 1 PUFF INTO THE LUNGS DAILY 60 each 5   VENTOLIN HFA 108 (90 Base) MCG/ACT inhaler INHALE 2 PUFFS BY MOUTH EVERY 6 HOURS AS NEEDED FOR WHEEZING OR SHORTNESS OF BREATH 54 g 0   nitroGLYCERIN (NITROSTAT) 0.4 MG SL tablet Place 1 tablet (0.4 mg total) under the tongue every 5 (five) minutes as needed for chest pain. 25 tablet 2   No facility-administered medications prior to visit.    Review of Systems  Constitutional:  Negative for chills, fever, malaise/fatigue and weight loss.  HENT:  Positive for congestion. Negative for sinus pain and sore throat.   Eyes: Negative.   Respiratory:  Positive for cough, sputum production, shortness of breath and wheezing. Negative for hemoptysis.   Cardiovascular:  Negative for chest pain, palpitations, orthopnea, claudication and leg swelling.  Gastrointestinal:  Negative for abdominal pain, heartburn, nausea and vomiting.  Genitourinary: Negative.   Musculoskeletal:  Negative for joint pain and myalgias.  Skin:  Negative for rash.  Neurological:  Negative for weakness.  Endo/Heme/Allergies: Negative.   Psychiatric/Behavioral: Negative.       Objective:   Vitals:   03/30/21 1101  BP: 132/76  Pulse: 75  SpO2: 93%  Weight: 223 lb (101.2 kg)  Height: 5\' 9"  (1.753 m)     Physical Exam Constitutional:      General: He is not in acute distress. HENT:     Head: Normocephalic and atraumatic.  Eyes:     Extraocular Movements: Extraocular movements intact.     Conjunctiva/sclera: Conjunctivae normal.     Pupils: Pupils are equal, round, and reactive to light.  Cardiovascular:     Rate and Rhythm: Normal rate and regular rhythm.     Pulses: Normal pulses.     Heart sounds: Normal heart sounds. No murmur heard. Pulmonary:     Breath sounds: Wheezing and rhonchi present. No rales.   Abdominal:     General: Bowel sounds are normal.     Palpations: Abdomen is soft.  Musculoskeletal:     Right lower leg: No edema.     Left lower leg: No edema.  Lymphadenopathy:     Cervical: No cervical adenopathy.  Skin:    General: Skin is warm and dry.  Neurological:     General: No focal deficit present.     Mental Status:  He is alert.  Psychiatric:        Mood and Affect: Mood normal.        Behavior: Behavior normal.        Thought Content: Thought content normal.        Judgment: Judgment normal.    CBC    Component Value Date/Time   WBC 7.7 01/24/2021 1243   RBC 4.94 01/24/2021 1243   HGB 13.7 01/24/2021 1243   HGB 14.7 11/23/2019 1156   HCT 41.9 01/24/2021 1243   HCT 43.8 11/23/2019 1156   PLT 251.0 01/24/2021 1243   PLT 373 11/23/2019 1156   MCV 84.9 01/24/2021 1243   MCV 83 11/23/2019 1156   MCH 27.2 06/01/2020 1807   MCHC 32.6 01/24/2021 1243   RDW 16.0 (H) 01/24/2021 1243   RDW 13.2 11/23/2019 1156   LYMPHSABS 1.3 01/24/2021 1243   LYMPHSABS 1.7 11/23/2019 1156   MONOABS 0.7 01/24/2021 1243   EOSABS 0.2 01/24/2021 1243   EOSABS 0.3 11/23/2019 1156   BASOSABS 0.0 01/24/2021 1243   BASOSABS 0.0 11/23/2019 1156   BMP Latest Ref Rng & Units 06/01/2020 11/23/2019 08/27/2017  Glucose 70 - 99 mg/dL 937(J) 696(V) 92  BUN 8 - 23 mg/dL 12 13 15   Creatinine 0.61 - 1.24 mg/dL 8.93 8.10  BUN/Creat Ratio 10 - 24 - 12 16  Sodium 135 - 145 mmol/L 139 140 141  Potassium 3.5 - 5.1 mmol/L 4.2 4.3 4.8  Chloride 98 - 111 mmol/L 104 104 102  CO2 22 - 32 mmol/L 26 21 26   Calcium 8.9 - 10.3 mg/dL 9.5 9.3 9.6   Chest imaging: CT Chest 12/07/20 Lung-RADS 2, benign appearance or behavior. Continue annual screening with low-dose chest CT without contrast in 12 months.   Stable tree-in-bud nodularity in the upper lobes likely reflect chronic atypical mycobacterial infection or post infectious inflammatory scarring.   Aortic Atherosclerosis (ICD10-I70.0) and  Emphysema (ICD10-J43.9).  PFT: PFT Results Latest Ref Rng & Units 11/18/2019 10/23/2013  FVC-Pre L 3.12 4.11  FVC-Predicted Pre % 74 93  FVC-Post L 2.85 4.12  FVC-Predicted Post % 67 94  Pre FEV1/FVC % % 53 58  Post FEV1/FCV % % 54 58  FEV1-Pre L 1.64 2.38  FEV1-Predicted Pre % 52 72  FEV1-Post L 1.54 2.41  DLCO uncorrected ml/min/mmHg 19.63 21.40  DLCO UNC% % 79 72  DLVA Predicted % 99 72  TLC L 7.38 8.64  TLC % Predicted % 111 130  RV % Predicted % 187 206    2021: Moderate obstruction without bronchodilator reversibility. Significant air trapping without hyperinflation.  Mildly reduced diffusion.   2015: Moderate obstruction with hyperinflation, no significant bronchodilator reversibility.  Mild diffusion impairment.  Echo 06/02/20: 1. Left ventricular ejection fraction, by estimation, is 55 to 60%. The  left ventricle has normal function. The left ventricle has no regional  wall motion abnormalities. Left ventricular diastolic parameters are  consistent with Grade I diastolic  dysfunction (impaired relaxation).   2. Right ventricular systolic function is normal. The right ventricular  size is normal.   3. The mitral valve is normal in structure. Trivial mitral valve  regurgitation. No evidence of mitral stenosis.   4. The aortic valve was not well visualized. Aortic valve regurgitation  is mild. Mild aortic valve sclerosis is present, with no evidence of  aortic valve stenosis.   5. The inferior vena cava is normal in size with greater than 50%  respiratory variability, suggesting right atrial pressure of  3 mmHg.   Heart Catheterization 08/30/2017: The left ventricular systolic function is normal. LV end diastolic pressure is normal. The left ventricular ejection fraction is 55-65% by visual estimate. There is no mitral valve regurgitation. Prox RCA lesion is 40% stenosed. Prox RCA to Mid RCA lesion is 50% stenosed. Mid RCA-1 lesion is 30% stenosed. Mid RCA-2 lesion  is 50% stenosed. Prox Cx lesion is 40% stenosed. Ost LAD to Prox LAD lesion is 30% stenosed.   1. Moderate non-obstructive, calcific stenoses in the RCA 2. Mild non-obstructive, calcific stenoses in the LAD and Circumflex 3. Normal LV systolic function   Recommendations: Medical management of CAD. Tobacco cessation.   Assessment & Plan:   Asthma-COPD overlap syndrome (HCC)  COPD with acute exacerbation (HCC) - Plan: azithromycin (ZITHROMAX) 250 MG tablet, predniSONE (DELTASONE) 10 MG tablet  Discussion: Nicholas Camacho is a 70 year old male, former smoker with COPD and nocturnal hypoxemia who returns to clinic for follow up.   Given his frequent exacerbations of COPD I am concerned that there is a component of asthma that is leading to his increased cough, shortness of breath and sputum production.  He has elevated peripheral eosinophil levels of 204 100 in the past.  He has required frequent courses of prednisone over the last year for his breathing.  We will treat him with prednisone taper and course of azithromycin for the asthma-COPD exacerbation.  I discussed with him the options of biologic therapy for asthma-COPD overlap syndrome.  He is amenable to starting Harrington Challenger and I will work with our pharmacy team to get him approved for this therapy.  He is to continue Trelegy Ellipta 1 puff daily and as needed albuterol and DuoNeb solutions.  I encouraged him to increase his flutter valve usage throughout the day to aid in further secretion clearance.  He is to continue montelukast daily as well as Zyrtec.  He is to also continue fluticasone nasal spray as well as ipratropium nasal spray.  He is to follow up in 3 months.  Melody Comas, MD [facility] Office: [phone]    Current Outpatient Medications:    apixaban (ELIQUIS) 5 MG TABS tablet, Take 1 tablet (5 mg total) by mouth 2 (two) times daily., Disp: 60 tablet, Rfl: 6   atorvastatin (LIPITOR) 80 MG  tablet, Take 1 tablet (80 mg total) by mouth daily. Pt needs to keep upcoming appt in July for further refills, Disp: 90 tablet, Rfl: 0   azithromycin (ZITHROMAX) 250 MG tablet, Take as directed, Disp: 6 tablet, Rfl: 0   cetirizine (ZYRTEC) 10 MG tablet, Take 10 mg by mouth daily., Disp: , Rfl:    Cyanocobalamin 1000 MCG CAPS, Take 1,000 mcg by mouth daily., Disp: , Rfl:    diltiazem (CARDIZEM CD) 180 MG 24 hr capsule, Take 1 capsule (180 mg total) by mouth daily., Disp: 90 capsule, Rfl: 2   fluticasone (FLONASE) 50 MCG/ACT nasal spray, SHAKE LIQUID AND USE 2 SPRAYS IN EACH NOSTRIL TWICE DAILY, Disp: 16 g, Rfl: 3   ipratropium (ATROVENT) 0.03 % nasal spray, Place 2 sprays into both nostrils every 12 (twelve) hours., Disp: 30 mL, Rfl: 12   ipratropium-albuterol (DUONEB) 0.5-2.5 (3) MG/3ML SOLN, USE 1 VIAL VIA NEBULIZER(3ML) THREE TIMES DAILY, Disp: 360 mL, Rfl: 11   losartan (COZAAR) 50 MG tablet, TAKE 1 TABLET(50 MG) BY MOUTH DAILY, Disp: 90 tablet, Rfl: 3   montelukast (SINGULAIR) 10 MG tablet, Take 1 tablet (10 mg total) by mouth at bedtime.,  Disp: 30 tablet, Rfl: 11   predniSONE (DELTASONE) 10 MG tablet, Take 1 tablet (10 mg total) by mouth daily with breakfast., Disp: 30 tablet, Rfl: 0   Respiratory Therapy Supplies (FLUTTER) DEVI, 1 Device by Does not apply route in the morning and at bedtime., Disp: 1 each, Rfl: 0   TRELEGY ELLIPTA 100-62.5-25 MCG/INH AEPB, INHALE 1 PUFF INTO THE LUNGS DAILY, Disp: 60 each, Rfl: 5   VENTOLIN HFA 108 (90 Base) MCG/ACT inhaler, INHALE 2 PUFFS BY MOUTH EVERY 6 HOURS AS NEEDED FOR WHEEZING OR SHORTNESS OF BREATH, Disp: 54 g, Rfl: 0   nitroGLYCERIN (NITROSTAT) 0.4 MG SL tablet, Place 1 tablet (0.4 mg total) under the tongue every 5 (five) minutes as needed for chest pain., Disp: 25 tablet, Rfl: 2

## 2021-03-30 NOTE — Telephone Encounter (Addendum)
Please start Fasenra BIV.  Dose: 30mg  at Week 0, Week 4,  Week 8 every 8 weeks thereafter  Dx: J44.9 (asthma-COPD overlap)  - recurrent prednisone bursts   ----- Message from , CMA sent at 03/30/2021 11:21 AM EDT ----- Regarding: 04/01/2021 Dr Harrington Challenger would like to start patient on this medication.  Please assist.

## 2021-03-30 NOTE — Patient Instructions (Addendum)
Continue montelukast 10mg  daily for allergies and breathing   Use flonase 2 sprays per day per nostril   Continue using ipratropium nasal spray 2 sprays twice daily per nostril   Continue with trelegy ellipta 1 puff daily   Continue using albuterol 1-2 puffs every 4-6 hours as needed.  Continued as needed DuoNeb nebulizer treatments 2-3 times per day.  Continue to use her flutter valve after each nebulizer treatment and you can use it periodically throughout the day to aid in further sputum clearance.  We will talk with our pharmacy team about getting you qualified for Fasenra injections.  This is due for asthma-COPD overlap syndrome due to your frequent need for prednisone due to exacerbations and breathing.

## 2021-03-30 NOTE — Telephone Encounter (Signed)
Submitted a Prior Authorization request to [employer] for [facility] via CoverMyMeds. Will update once we receive a response.   Key: [id number]

## 2021-04-04 NOTE — Telephone Encounter (Signed)
Sent patient portion via DocuSign. Awaiting f/u.

## 2021-04-04 NOTE — Telephone Encounter (Signed)
Provider portion of [place] application for Fasenra placed in Dr. Lanora Manis office. Patient portion will be emailed to patient via Docusign by [facility]. Will route to Dana to do so. Email on file confirmed to be correct by patient

## 2021-04-05 NOTE — Telephone Encounter (Signed)
Provider portion received, still awaiting patient portion.

## 2021-04-10 NOTE — Telephone Encounter (Signed)
LVM with pt inquiring on status of PAP forms. Direct office line provided, will continue to f/u.

## 2021-04-11 NOTE — Telephone Encounter (Signed)
Submitted Patient Assistance Application to [place] for [facility] along with provider portion, PA and copies of insurance cards. Will update patient when we receive a response.  Fax# [phone] Phone# [phone]

## 2021-04-12 NOTE — Progress Notes (Signed)
Cardiology Office Note:    Date:  04/17/2021   ID:  Nicholas Camacho, DOB Mar 06, 1951, MRN [id number]  PCP:  Meriam Sprague, MD   [facility] Providers Cardiologist:  Meriam Sprague, MD {   Referring MD: Farris Has, MD    History of Present Illness:    Nicholas Camacho is a 70 y.o. male with a hx of pAfib on apixaban, HTN, HLD, COPD, OSA, small AAA, and iliac aneurysm who was previously followed by Dr. Delton See who now presents to clinic for follow-up.  Per review of the record, the patient underwent coronary CTA 08/20/17 for mild fatigue on exertion showed calcium score of 3244 which was the 99th percentile for age and sex.  Moderate stenosis in the LAD and RCA with suspicion for severe stenosis in the mid RCA.  Cardiac catheterization 08/30/17 showed moderate nonobstructive calcific stenosis in the RCA with mild nonobstructive calcific stenosis in the LAD and circumflex normal LV function.  Medical management was recommended as well as smoking cessation.    Patient suffered a stroke in 06/2020 due to missed doses of eliquis. His head CT did not show any changes other than chronic microvascular changes.  Brain MRI showed Cluster of small acute infarctions at the left frontoparietal junction region, consistent with left MCA branch vessel insult. No large confluent infarction. No swelling or hemorrhage. 2. Mild to moderate chronic small-vessel ischemic changes of the cerebral hemispheric white matter.  Was last seen 09/13/20 where he was recovering from his stroke. No CV issues at that time.  Today, the patient is feeling better. Recovered from recent CVA with no residual deficits. Has been taking his medications as prescribed without missing doses. No chest pain, lightheadedness, palpitations. Has chronic dyspnea due to COPD which has worsened in the summer. No LE edema, PND, orthopnea. Will wake up needing to cough. Uses concentrator at night and sleeps at an incline which  helps. No bleeding issues with apixaban.  Past Medical History:  Diagnosis Date   COPD (chronic obstructive pulmonary disease) (HCC)    Disturbance of skin sensation 06/24/2013   Dysrhythmia    Elevated cholesterol    History of echocardiogram    Echo 4/17: EF 60-65%, no RWMA, Gr 2 DD   Hypertension    Sleep disturbance, unspecified 06/24/2013    Past Surgical History:  Procedure Laterality Date   ESOPHAGOGASTRODUODENOSCOPY N/A 01/08/2016   Procedure: ESOPHAGOGASTRODUODENOSCOPY (EGD);  Surgeon: Carman Ching, MD;  Location: [facility] ENDOSCOPY;  Service: Endoscopy;  Laterality: N/A;   fractured pelvis     INGUINAL HERNIA REPAIR Bilateral    LEFT HEART CATH AND CORONARY ANGIOGRAPHY N/A 08/30/2017   Procedure: LEFT HEART CATH AND CORONARY ANGIOGRAPHY;  Surgeon: Kathleene Hazel, MD;  Location: [facility] INVASIVE CV LAB;  Service: Cardiovascular;  Laterality: N/A;   reconstruct left heel  2002   TONSILLECTOMY      Current Medications: Current Meds  Medication Sig   apixaban (ELIQUIS) 5 MG TABS tablet Take 1 tablet (5 mg total) by mouth 2 (two) times daily.   atorvastatin (LIPITOR) 80 MG tablet Take 1 tablet (80 mg total) by mouth daily. Pt needs to keep upcoming appt in July for further refills   cetirizine (ZYRTEC) 10 MG tablet Take 10 mg by mouth daily.   Cyanocobalamin 1000 MCG CAPS Take 1,000 mcg by mouth daily.   diltiazem (CARDIZEM CD) 180 MG 24 hr capsule Take 1 capsule (180 mg total) by mouth daily.   fluticasone (FLONASE) 50 MCG/ACT  nasal spray SHAKE LIQUID AND USE 2 SPRAYS IN EACH NOSTRIL TWICE DAILY   ipratropium (ATROVENT) 0.03 % nasal spray Place 2 sprays into both nostrils every 12 (twelve) hours.   ipratropium-albuterol (DUONEB) 0.5-2.5 (3) MG/3ML SOLN USE 1 VIAL VIA NEBULIZER(3ML) THREE TIMES DAILY   losartan (COZAAR) 50 MG tablet TAKE 1 TABLET(50 MG) BY MOUTH DAILY   montelukast (SINGULAIR) 10 MG tablet Take 1 tablet (10 mg total) by mouth at bedtime.   predniSONE  (DELTASONE) 10 MG tablet Take 1 tablet (10 mg total) by mouth daily with breakfast.   Respiratory Therapy Supplies (FLUTTER) DEVI 1 Device by Does not apply route in the morning and at bedtime.   TRELEGY ELLIPTA 100-62.5-25 MCG/INH AEPB INHALE 1 PUFF INTO THE LUNGS DAILY   VENTOLIN HFA 108 (90 Base) MCG/ACT inhaler INHALE 2 PUFFS BY MOUTH EVERY 6 HOURS AS NEEDED FOR WHEEZING OR SHORTNESS OF BREATH     Allergies:   Morphine and related   Social History   Socioeconomic History   Marital status: Married    Spouse name: Not on file   Number of children: 2   Years of education: COLLEGE   Highest education level: Not on file  Occupational History   Occupation: retired  Tobacco Use   Smoking status: Former    Packs/day: 1.50    Years: 45.00    Pack years: 67.50    Types: Cigarettes    Quit date: 11/19/2015    Years since quitting: 5.4   Smokeless tobacco: Never   Tobacco comments:    pt quit smoking!! 11/19/15  Vaping Use   Vaping Use: Never used  Substance and Sexual Activity   Alcohol use: No    Alcohol/week: 0.0 standard drinks   Drug use: No   Sexual activity: Not on file  Other Topics Concern   Not on file  Social History Narrative   Not on file   Social Determinants of Health   Financial Resource Strain: Not on file  Food Insecurity: Not on file  Transportation Needs: Not on file  Physical Activity: Not on file  Stress: Not on file  Social Connections: Not on file     Family History: The patient's family history includes Alcoholism in his sister; Diabetes in his mother; Drug abuse in his sister; Kidney failure in his father; Prostate cancer in his father. There is no history of Stroke.  ROS:   Please see the history of present illness.    Review of Systems  Constitutional:  Negative for chills and fever.  HENT:  Negative for sore throat.   Eyes:  Negative for blurred vision.  Respiratory:  Positive for cough, shortness of breath and wheezing.    Cardiovascular:  Negative for chest pain, palpitations, orthopnea, claudication, leg swelling and PND.  Gastrointestinal:  Negative for blood in stool, melena, nausea and vomiting.  Genitourinary:  Negative for hematuria.  Musculoskeletal:  Negative for falls.  Neurological:  Negative for dizziness and loss of consciousness.  Psychiatric/Behavioral:  Negative for substance abuse.     EKGs/Labs/Other Studies Reviewed:    The following studies were reviewed today: CT Chest 12/07/20: IMPRESSION: Lung-RADS 2, benign appearance or behavior. Continue annual screening with low-dose chest CT without contrast in 12 months.   Stable tree-in-bud nodularity in the upper lobes likely reflect chronic atypical mycobacterial infection or post infectious inflammatory scarring.   Aortic Atherosclerosis (ICD10-I70.0) and Emphysema (ICD10-J43.9).  TTE 11/26/2019   1. Left ventricular ejection fraction, by estimation, is 55 to  60%. The  left ventricle has normal function. The left ventricle has no regional  wall motion abnormalities. Left ventricular diastolic parameters are  consistent with Grade I diastolic  dysfunction (impaired relaxation).   2. Right ventricular systolic function is normal. The right ventricular  size is normal. Tricuspid regurgitation signal is inadequate for assessing  PA pressure.   3. The mitral valve is grossly normal. No evidence of mitral valve  regurgitation. No evidence of mitral stenosis.   4. The aortic valve is tricuspid. Aortic valve regurgitation is mild. No  aortic stenosis is present. Aortic regurgitation PHT measures 658 msec.   5. Aortic dilatation noted. There is mild dilatation of the aortic root  measuring 41 mm.   6. The inferior vena cava is normal in size with greater than 50%  respiratory variability, suggesting right atrial pressure of 3 mmHg.  Carotid ultrasound 06/2020: Summary:  Right Carotid: Velocities in the right ICA are consistent with a  1-39%  stenosis.   Left Carotid: Velocities in the left ICA are consistent with a 1-39%  stenosis.   Vertebrals: Bilateral vertebral arteries demonstrate antegrade flow.   Coronary Cath 08/30/17:  The left ventricular systolic function is normal. LV end diastolic pressure is normal. The left ventricular ejection fraction is 55-65% by visual estimate. There is no mitral valve regurgitation. Prox RCA lesion is 40% stenosed. Prox RCA to Mid RCA lesion is 50% stenosed. Mid RCA-1 lesion is 30% stenosed. Mid RCA-2 lesion is 50% stenosed. Prox Cx lesion is 40% stenosed. Ost LAD to Prox LAD lesion is 30% stenosed.   1. Moderate non-obstructive, calcific stenoses in the RCA 2. Mild non-obstructive, calcific stenoses in the LAD and Circumflex 3. Normal LV systolic function   Recommendations: Medical management of CAD. Tobacco cessation.  EKG:  No new ECG today  Recent Labs: 06/01/2020: ALT 21; BUN 12; Creatinine, Ser 1.02; Potassium 4.2; Sodium 139 06/02/2020: TSH 3.962 01/24/2021: Hemoglobin 13.7; Platelets 251.0  Recent Lipid Panel    Component Value Date/Time   CHOL 88 06/03/2020 1400   CHOL 79 (L) 11/23/2019 1156   TRIG 82 06/03/2020 1400   HDL 24 (L) 06/03/2020 1400   HDL 25 (L) 11/23/2019 1156   CHOLHDL 3.7 06/03/2020 1400   VLDL 16 06/03/2020 1400   LDLCALC 48 06/03/2020 1400   LDLCALC 41 11/23/2019 1156     Risk Assessment/Calculations:    CHA2DS2-VASc Score = 6  This indicates a 9.7% annual risk of stroke. The patient's score is based upon: CHF History: No HTN History: Yes Diabetes History: Yes Stroke History: Yes Vascular Disease History: Yes Age Score: 1 Gender Score: 0        Physical Exam:    VS:  BP 130/80   Pulse (!) 120   Ht 5\' 9"  (1.753 m)   Wt 220 lb 12.8 oz (100.2 kg)   SpO2 94%   BMI 32.61 kg/m     Wt Readings from Last 3 Encounters:  04/17/21 220 lb 12.8 oz (100.2 kg)  03/30/21 223 lb (101.2 kg)  01/24/21 226 lb 6.4 oz (102.7 kg)      GEN:  Well nourished, well developed in no acute distress HEENT: Normal NECK: No JVD; No carotid bruits CARDIAC: Irregular, no murmurs RESPIRATORY:  Diffuse, scattered wheezes with inspiration and expiration ABDOMEN: Soft, non-tender, non-distended MUSCULOSKELETAL:  No edema; No deformity  SKIN: Warm and dry NEUROLOGIC:  Alert and oriented x 3 PSYCHIATRIC:  Normal affect   ASSESSMENT:    1. 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 Thoracic aortic aneurysm without rupture (HCC)   3. PAF (paroxysmal atrial fibrillation) (HCC)   4. Essential hypertension   5. Hyperlipidemia, unspecified hyperlipidemia type   6. Cerebrovascular accident (CVA) due to embolism of middle cerebral artery, unspecified blood vessel laterality (HCC)   7. Chronic respiratory failure with hypoxia (HCC)   8. Ascending aortic aneurysm (HCC)   9. Former smoker    PLAN:    In order of problems listed above:  #Coronary Artery Disease: Cardiac catheterization 08/30/17 showed moderate nonobstructive calcific stenosis in the RCA with mild nonobstructive calcific stenosis in the LAD and circumflex normal LV function.  On medical management. No anginal symptoms. -[facility] -Continue losartan 50mg  daily -Continue lipitor 80mg  daily -No BB due to severe COPD  #Paroxysmal Afib: CHADs-vasc 6. Tolerating AC as prescribed with no palpitations.  -Continue apixaban 5mg  BID -Continue dilt 180mg  daily -History of GIB with xarelto  #COPD: -Continue home inhalers -Follows with Pulm -Has yearly CT screening for cancer; last 11/2020  #HLD: LDL controlled on last check at 48 (06/2020) -Continue lipitor 80mg  daily -Labs per PCP  #HTN: Controlled with BP <120s/80s at home -Continue losartan 50mg  daily -Continue dilt 180mg  daily  #Lung Nodules: Stable on last imaging 11/2020. -Continue serial monitoring 11/2021  #Ascending  aortic aneurysm: Measured 41mm on TTE 11/2019. -Check CTA aorta  #Mild Carotid Disease: 1-39% bilaterally.  -Will repeat in 06/2022 -Not on ASA due to need for apixaban -Continue lipitor 80mg  daily    Medication Adjustments/Labs and Tests Ordered: Current medicines are reviewed at length with the patient today.  Concerns regarding medicines are outlined above.  Orders Placed This Encounter  Procedures   CT ANGIO CHEST AORTA W/CM & OR WO/CM   Basic metabolic panel    No orders of the defined types were placed in this encounter.   Patient Instructions  Medication Instructions:  Your physician recommends that you continue on your current medications as directed. Please refer to the Current Medication list given to you today.  *If you need a refill on your cardiac medications before your next appointment, please call your pharmacy*   Lab Work: BMET  If you have labs (blood work) drawn today and your tests are completely normal, you will receive your results only by: MyChart Message (if you have MyChart) OR A paper copy in the mail If you have any lab test that is abnormal or we need to change your treatment, we will call you to review the results.   Testing/Procedures: Non-Cardiac CT Angiography (CTA), is a special type of CT scan that uses a computer to produce multi-dimensional views of major blood vessels throughout the body. In CT angiography, a contrast material is injected through an IV to help visualize the blood vessels    Follow-Up: At [facility], you and your health needs are our priority.  As part of our continuing mission to provide you with exceptional heart care, we have created designated Provider Care Teams.  These Care Teams include your primary Cardiologist (physician) and Advanced Practice Providers (APPs -  Physician Assistants and Nurse Practitioners) who all work together to provide you with the care you need, when you need it.  We recommend signing  up for the patient portal called "MyChart".  Sign up information is provided on this After Visit Summary.  MyChart is used to connect with patients for Virtual  Visits (Telemedicine).  Patients are able to view lab/test results, encounter notes, upcoming appointments, etc.  Non-urgent messages can be sent to your provider as well.   To learn more about what you can do with MyChart, go to [email or web address].    Your next appointment:   6 month(s)  The format for your next appointment:   In Person  Provider:   You may see Laurance Flatten, MD or one of the following Advanced Practice Providers on your designated Care Team:   Tereso Newcomer, PA-C Chelsea Aus, [place]   Other Instructions    Signed, Meriam Sprague, MD  04/17/2021 1:27 PM    [facility]

## 2021-04-13 NOTE — Telephone Encounter (Signed)
Received email from patient that [employer] had reached out to him regarding his approval for patient assistance. They are in the process of mailing the enrollment forms to him. Requested pt reach back out to [place] here at the clinic if he had any additional questions or concerns, but otherwise nothing additional needed at this time.

## 2021-04-14 ENCOUNTER — Telehealth: Payer: Self-pay | Admitting: Pulmonary Disease

## 2021-04-17 ENCOUNTER — Encounter: Payer: Self-pay | Admitting: Cardiology

## 2021-04-17 ENCOUNTER — Ambulatory Visit: Payer: Medicare Other | Admitting: Cardiology

## 2021-04-17 ENCOUNTER — Other Ambulatory Visit: Payer: Self-pay

## 2021-04-17 VITALS — BP 130/80 | HR 120 | Ht 69.0 in | Wt 220.8 lb

## 2021-04-17 DIAGNOSIS — I10 Essential (primary) hypertension: Secondary | ICD-10-CM

## 2021-04-17 DIAGNOSIS — I712 Thoracic aortic aneurysm, without rupture, unspecified: Secondary | ICD-10-CM

## 2021-04-17 DIAGNOSIS — J9611 Chronic respiratory failure with hypoxia: Secondary | ICD-10-CM

## 2021-04-17 DIAGNOSIS — I2510 Atherosclerotic heart disease of native coronary artery without angina pectoris: Secondary | ICD-10-CM

## 2021-04-17 DIAGNOSIS — E785 Hyperlipidemia, unspecified: Secondary | ICD-10-CM

## 2021-04-17 DIAGNOSIS — Z87891 Personal history of nicotine dependence: Secondary | ICD-10-CM

## 2021-04-17 DIAGNOSIS — I480 Paroxysmal atrial fibrillation: Secondary | ICD-10-CM | POA: Diagnosis not present

## 2021-04-17 DIAGNOSIS — I63419 Cerebral infarction due to embolism of unspecified middle cerebral artery: Secondary | ICD-10-CM

## 2021-04-17 DIAGNOSIS — I7121 Aneurysm of the ascending aorta, without rupture: Secondary | ICD-10-CM

## 2021-04-17 LAB — BASIC METABOLIC PANEL
BUN/Creatinine Ratio: 18 (ref 10–24)
BUN: 19 mg/dL (ref 8–27)
CO2: 23 mmol/L (ref 20–29)
Calcium: 9.7 mg/dL (ref 8.6–10.2)
Chloride: 101 mmol/L (ref 96–106)
Creatinine, Ser: 1.03 mg/dL (ref 0.76–1.27)
Glucose: 157 mg/dL — ABNORMAL HIGH (ref 65–99)
Potassium: 4.3 mmol/L (ref 3.5–5.2)
Sodium: 140 mmol/L (ref 134–144)
eGFR: 79 mL/min/{1.73_m2} (ref >59)

## 2021-04-17 NOTE — Patient Instructions (Addendum)
Medication Instructions:  Your physician recommends that you continue on your current medications as directed. Please refer to the Current Medication list given to you today.  *If you need a refill on your cardiac medications before your next appointment, please call your pharmacy*   Lab Work: BMET  If you have labs (blood work) drawn today and your tests are completely normal, you will receive your results only by: MyChart Message (if you have MyChart) OR A paper copy in the mail If you have any lab test that is abnormal or we need to change your treatment, we will call you to review the results.   Testing/Procedures: Non-Cardiac CT Angiography (CTA), is a special type of CT scan that uses a computer to produce multi-dimensional views of major blood vessels throughout the body. In CT angiography, a contrast material is injected through an IV to help visualize the blood vessels    Follow-Up: At [facility], you and your health needs are our priority.  As part of our continuing mission to provide you with exceptional heart care, we have created designated Provider Care Teams.  These Care Teams include your primary Cardiologist (physician) and Advanced Practice Providers (APPs -  Physician Assistants and Nurse Practitioners) who all work together to provide you with the care you need, when you need it.  We recommend signing up for the patient portal called "MyChart".  Sign up information is provided on this After Visit Summary.  MyChart is used to connect with patients for Virtual Visits (Telemedicine).  Patients are able to view lab/test results, encounter notes, upcoming appointments, etc.  Non-urgent messages can be sent to your provider as well.   To learn more about what you can do with MyChart, go to [email or web address].    Your next appointment:   6 month(s)  The format for your next appointment:   In Person  Provider:   You may see Laurance Flatten, MD or one of the  following Advanced Practice Providers on your designated Care Team:   Tereso Newcomer, PA-C Chelsea Aus, [place]   Other Instructions

## 2021-04-17 NOTE — Telephone Encounter (Signed)
Called [employer] for approval letter for [facility] patient assistance. Patient approved from 04/11/21 through 09/30/21.  Requested rep fax approval letter to pharmacy team to file in patient's chart.  Patient scheduled for Harrington Challenger new start on 04/19/21  Chesley Mires, PharmD, MPH Clinical Pharmacist (Rheumatology and Pulmonology)

## 2021-04-17 NOTE — Telephone Encounter (Signed)
Patient scheduled for Fasenra new start on 04/19/21 @ 1:20pm with pharmacy team. Closing encounter  Chesley Mires, PharmD, MPH Clinical Pharmacist (Rheumatology and Pulmonology)

## 2021-04-17 NOTE — Telephone Encounter (Signed)
Patient reached out via email stating that he had received his injection at home and was under the impression that Dr. Francine Graven told him his first injection would be in the clinic. Spoke to pt via telephone and confirmed that his first injection would be in office and that my pharmacist would be reaching out to him to get him scheduled.

## 2021-04-19 ENCOUNTER — Ambulatory Visit: Payer: Medicare Other | Admitting: Pharmacist

## 2021-04-19 ENCOUNTER — Other Ambulatory Visit: Payer: Self-pay

## 2021-04-19 DIAGNOSIS — J449 Chronic obstructive pulmonary disease, unspecified: Secondary | ICD-10-CM

## 2021-04-19 MED ORDER — FASENRA PEN 30 MG/ML ~~LOC~~ SOAJ: SUBCUTANEOUS | 2 refills | Status: DC

## 2021-04-19 NOTE — Patient Instructions (Addendum)
Your next Harrington Challenger dose is due on 05/17/21, 06/14/21, and every 8 weeks thereafter (starting on 08/09/21)  Your prescription will be shipped from [facility]. Their phone number is [phone] Please call to schedule shipment and confirm address. They will mail medication to your home.  Remember the 5 C's: COUNTER - leave on the counter at least 30 minutes but up to overnight to bring medication to room temperature. This may help prevent stinging COLD - place something cold (like an ice gel pack or cold water bottle) on the injection site just before cleansing with alcohol. This may help reduce pain CLARITIN - use Claritin (generic name is loratadine) for the first two weeks of treatment or the day of, the day before, and the day after injecting. This will help to minimize injection site reactions CORTISONE CREAM - apply if injection site is irritated and itching CALL ME - if injection site reaction is bigger than the size of your fist, looks infected, blisters, or if you develop hives   Zoster Vaccine, Recombinant injection What is this medication? ZOSTER VACCINE (ZOS ter vak SEEN) is a vaccine used to reduce the risk of getting shingles. This vaccine is not used to treat shingles or nerve pain fromshingles. This medicine may be used for other purposes; ask your health care provider orpharmacist if you have questions. COMMON BRAND NAME(S): [facility] What should I tell my care team before I take this medication? They need to know if you have any of these conditions: cancer immune system problems an unusual or allergic reaction to Zoster vaccine, other medications, foods, dyes, or preservatives pregnant or trying to get pregnant breast-feeding How should I use this medication? This vaccine is injected into a muscle. It is given by a health care provider. A copy of Vaccine Information Statements will be given before each vaccination. Be sure to read this information carefully each time.  This sheet may changeoften. Talk to your health care provider about the use of this vaccine in children.This vaccine is not approved for use in children. Overdosage: If you think you have taken too much of this medicine contact apoison control center or emergency room at once. NOTE: This medicine is only for you. Do not share this medicine with others. What if I miss a dose? Keep appointments for follow-up (booster) doses. It is important not to miss your dose. Call your health care provider if you are unable to keep anappointment. What may interact with this medication? medicines that suppress your immune system medicines to treat cancer steroid medicines like prednisone or cortisone This list may not describe all possible interactions. Give your health care provider a list of all the medicines, herbs, non-prescription drugs, or dietary supplements you use. Also tell them if you smoke, drink alcohol, or use illegaldrugs. Some items may interact with your medicine. What should I watch for while using this medication? Visit your health care provider regularly. This vaccine, like all vaccines, may not fully protect everyone. What side effects may I notice from receiving this medication? Side effects that you should report to your doctor or health care professionalas soon as possible: allergic reactions (skin rash, itching or hives; swelling of the face, lips, or tongue) trouble breathing Side effects that usually do not require medical attention (report these toyour doctor or health care professional if they continue or are bothersome): chills headache fever nausea pain, redness, or irritation at site where injected tiredness vomiting This list may not describe all possible side effects. Call your  doctor for medical advice about side effects. You may report side effects to FDA at1-800-FDA-1088. Where should I keep my medication? This vaccine is only given by a health care provider. It will  not be stored athome. NOTE: This sheet is a summary. It may not cover all possible information. If you have questions about this medicine, talk to your doctor, pharmacist, orhealth care provider.  2022 Elsevier/Gold Standard (2019-10-23 16:23:07)

## 2021-04-19 NOTE — Progress Notes (Signed)
HPI Nicholas Camacho presents today to [facility] to see pharmacy team for [facility] new start.  Past medical history includes asthma-COPD overlap syndrome, allergic rhinitis, former smoker (quit in 2017). CV history significant for TIA, CVA, CAD, afib, and HTN.  He finished his Z-pack course and has about a week of prednisone remaining and continues to take as prescribed.  Respiratory Medications Current regimen: Trelegy 100/62.5/25 mcg (1 puff once daily), fluticasone nasal spray twice daily alternating with iptratropium nasal spray, montelukast 10mg  nightly Patient reports no known adherence challenges - cost is not barrier and able to tolerate medication without any side effects  OBJECTIVE Allergies  Allergen Reactions   Morphine And Related     HALLUCINATIONS    Outpatient Encounter Medications as of 04/19/2021  Medication Sig   apixaban (ELIQUIS) 5 MG TABS tablet Take 1 tablet (5 mg total) by mouth 2 (two) times daily.   atorvastatin (LIPITOR) 80 MG tablet Take 1 tablet (80 mg total) by mouth daily. Pt needs to keep upcoming appt in July for further refills   azithromycin (ZITHROMAX) 250 MG tablet Take as directed (Patient not taking: Reported on 04/17/2021)   cetirizine (ZYRTEC) 10 MG tablet Take 10 mg by mouth daily.   Cyanocobalamin 1000 MCG CAPS Take 1,000 mcg by mouth daily.   diltiazem (CARDIZEM CD) 180 MG 24 hr capsule Take 1 capsule (180 mg total) by mouth daily.   fluticasone (FLONASE) 50 MCG/ACT nasal spray SHAKE LIQUID AND USE 2 SPRAYS IN EACH NOSTRIL TWICE DAILY   ipratropium (ATROVENT) 0.03 % nasal spray Place 2 sprays into both nostrils every 12 (twelve) hours.   ipratropium-albuterol (DUONEB) 0.5-2.5 (3) MG/3ML SOLN USE 1 VIAL VIA NEBULIZER(3ML) THREE TIMES DAILY   losartan (COZAAR) 50 MG tablet TAKE 1 TABLET(50 MG) BY MOUTH DAILY   montelukast (SINGULAIR) 10 MG tablet Take 1 tablet (10 mg total) by mouth at bedtime.   nitroGLYCERIN (NITROSTAT) 0.4 MG SL tablet  Place 1 tablet (0.4 mg total) under the tongue every 5 (five) minutes as needed for chest pain.   predniSONE (DELTASONE) 10 MG tablet Take 1 tablet (10 mg total) by mouth daily with breakfast.   Respiratory Therapy Supplies (FLUTTER) DEVI 1 Device by Does not apply route in the morning and at bedtime.   TRELEGY ELLIPTA 100-62.5-25 MCG/INH AEPB INHALE 1 PUFF INTO THE LUNGS DAILY   VENTOLIN HFA 108 (90 Base) MCG/ACT inhaler INHALE 2 PUFFS BY MOUTH EVERY 6 HOURS AS NEEDED FOR WHEEZING OR SHORTNESS OF BREATH   No facility-administered encounter medications on file as of 04/19/2021.     Immunization History  Administered Date(s) Administered   Influenza Split 06/14/2015   Influenza, High Dose Seasonal PF 07/22/2018, 08/20/2019   Influenza,inj,Quad PF,6+ Mos 06/20/2015, 11/02/2016, 07/25/2017, 06/28/2020   Influenza-Unspecified 08/24/2019   PFIZER(Purple Top)SARS-COV-2 Vaccination 10/20/2019, 11/10/2019, 06/28/2020, 01/11/2021   Pneumococcal Conjugate-13 10/06/2013, 02/06/2017   Pneumococcal Polysaccharide-23 10/06/2013, 03/19/2019   Tdap 05/01/2016     PFTs PFT Results Latest Ref Rng & Units 11/18/2019 10/23/2013  FVC-Pre L 3.12 4.11  FVC-Predicted Pre % 74 93  FVC-Post L 2.85 4.12  FVC-Predicted Post % 67 94  Pre FEV1/FVC % % 53 58  Post FEV1/FCV % % 54 58  FEV1-Pre L 1.64 2.38  FEV1-Predicted Pre % 52 72  FEV1-Post L 1.54 2.41  DLCO uncorrected ml/min/mmHg 19.63 21.40  DLCO UNC% % 79 72  DLVA Predicted % 99 72  TLC L 7.38 8.64  TLC % Predicted % 111 130  RV % Predicted % 187 206     Eosinophils Most recent blood eosinophil count was 200 cells/microL taken on 01/24/21.   IgE: 104 on 01/24/21   Assessment   Biologics training for benralizumab Harrington Challenger)  Goals of therapy: Mechanism of action: humanized afucosylated, monoclonal antibody (IgG1, kappa) that binds to the alpha subunit of the interleukin-5 receptor. IL-5 is the major cytokine responsible for the growth and  differentiation, recruitment, activation, and survival of eosinophils (a cell type associated with inflammation and an important component in the pathogenesis of asthma) Reviewed that Harrington Challenger is add-on medication and patient must continue maintenance inhaler regimen. Response to therapy: may take 4 months to determine efficacy. Discussed that patients generally feel improvement sooner than 4 months.  Side effects: antibody development (13%), headache (8%), pharyngitis (5%), injection site reaction (2.2%)  Dose: 30 mg subcutaneously at Week 0 (received today on 04/19/21), Week 4, Week 8, then 30mg  every 8 weeks thereafter  Administration/Storage:   Reviewed administration sites of thigh or abdomen (at least 2-3 inches away from abdomen). Reviewed the upper arm is only appropriate if caregiver is administering injection  Do not shake the pen/syringe as this could lead to product foaming or precipitation. \ Reviewed storage of medication in refrigerator. Reviewed that can be stored at room temperature in unopened carton for up to 14 days.  Side effects: headache (19%), injection site reaction (7-15%), antibody development (6%), backache (5%), fatigue (5%)  Access: Approval of Fasenra through: patient assistance through 09/30/21. Patient advised that [employer] will mail him a new application for re-enrollment in November to complete. Reviewed that he may complete and resubmit to company on his own or return to pharmacy team at clinic once completed.  Patient able to successfully and appropriate self-administer Fasenra 30mg /mL auto-injector pen: NDC: December Lot: [id number] Exp: 11/2022  Medication Reconciliation  A drug regimen assessment was performed, including review of allergies, interactions, disease-state management, dosing and immunization history. Medications were reviewed with the patient, including name, instructions, indication, goals of therapy, potential side effects,  importance of adherence, and safe use.  Drug interaction(s): none noted  Immunizations  Patient has received 4 COVID19 vaccines. He is UTD on influenza and pneumonia vaccines. Has not yet received zoster vaccine but we reviewed importance of being UTD on vaccines since he is immunocompromised. He states he continues to discuss this with his PCP. More zoster vaccine information placed in AVS.  PLAN Continue Fasenra 30mg  at Week 4 on 05/17/21 and Week 8 on 06/14/21. Then continue Fasenra 30mg  every 8 weeks thereafter starting on 08/09/21. Rx sent to: [facility]: [phone]. Patient has already received medication at home and placed in refrigerator. He was provided with printed calendar through June 2023 with Fasenra dose due dates. Continue maintenance asthma regimen of: Trelegy 100/62.5/25 mcg (1 puff once daily), fluticasone nasal spray twice daily alternating with iptratropium nasal spray, montelukast 10mg  nightly  F/u with Dr. 13/9/22 already scheduled for 06/30/21.  All questions encouraged and answered.  Instructed patient to reach out with any further questions or concerns.  Thank you for allowing pharmacy to participate in this patient's care.  This appointment required 60 minutes of patient care (this includes precharting, chart review, review of results, face-to-face care, etc.).  July 2023, PharmD, MPH, BCPS Clinical Pharmacist (Rheumatology and Pulmonology)

## 2021-04-21 ENCOUNTER — Telehealth: Payer: Self-pay | Admitting: Cardiology

## 2021-04-21 ENCOUNTER — Ambulatory Visit (INDEPENDENT_AMBULATORY_CARE_PROVIDER_SITE_OTHER)
Admission: RE | Admit: 2021-04-21 | Discharge: 2021-04-21 | Disposition: A | Discharge status: Home / Self Care | Payer: Medicare Other | Source: Ambulatory Visit | Attending: Cardiology | Admitting: Cardiology

## 2021-04-21 ENCOUNTER — Other Ambulatory Visit: Payer: Self-pay

## 2021-04-21 DIAGNOSIS — I712 Thoracic aortic aneurysm, without rupture, unspecified: Secondary | ICD-10-CM

## 2021-04-21 MED ORDER — IOHEXOL 350 MG/ML SOLN
100.0000 mL | Freq: Once | INTRAVENOUS | Status: AC | PRN
  Administered 2021-04-21: 100 mL via INTRAVENOUS

## 2021-04-21 NOTE — Telephone Encounter (Signed)
Pt is returning a call  

## 2021-04-21 NOTE — Telephone Encounter (Signed)
Returned pt's call, pt had missed a call regarding his CT results. Nurse relayed result from Laurance Flatten, MD:  Nicholas Sprague, MD  04/21/2021  1:12 PM EDT      The CT scan of his aorta looks good and it is normal in size by their measurements. This is a much better test for looking at the aorta size and since it looks normal, we do not need to repeat testing again. Otherwise,everything else looks stable.    Patient verbalized understanding, all questions and concerns addressed at this time.

## 2021-04-24 ENCOUNTER — Other Ambulatory Visit: Payer: Self-pay

## 2021-04-24 MED ORDER — ATORVASTATIN CALCIUM 80 MG PO TABS: 80.0000 mg | ORAL_TABLET | Freq: Every day | ORAL | 3 refills | Status: DC

## 2021-04-27 DIAGNOSIS — Z0000 Encounter for general adult medical examination without abnormal findings: Secondary | ICD-10-CM | POA: Diagnosis not present

## 2021-04-27 DIAGNOSIS — E785 Hyperlipidemia, unspecified: Secondary | ICD-10-CM | POA: Diagnosis not present

## 2021-04-27 DIAGNOSIS — E1169 Type 2 diabetes mellitus with other specified complication: Secondary | ICD-10-CM | POA: Diagnosis not present

## 2021-04-27 DIAGNOSIS — I10 Essential (primary) hypertension: Secondary | ICD-10-CM | POA: Diagnosis not present

## 2021-04-27 DIAGNOSIS — E538 Deficiency of other specified B group vitamins: Secondary | ICD-10-CM | POA: Diagnosis not present

## 2021-05-24 ENCOUNTER — Other Ambulatory Visit: Payer: Self-pay | Admitting: Pulmonary Disease

## 2021-06-06 ENCOUNTER — Telehealth: Payer: Self-pay | Admitting: Pulmonary Disease

## 2021-06-06 DIAGNOSIS — J449 Chronic obstructive pulmonary disease, unspecified: Secondary | ICD-10-CM

## 2021-06-07 MED ORDER — FASENRA PEN 30 MG/ML ~~LOC~~ SOAJ: 30.0000 mg | SUBCUTANEOUS | 1 refills | Status: DC

## 2021-06-07 NOTE — Telephone Encounter (Signed)
Patient started Fasenra on 04/19/21 in clinic  Week 0 : 04/19/21 Week 4: 05/17/21 Week 8: 06/14/21 Week 16: 08/09/21  He has f/u on 06/30/21 with Dr. Francine Graven  Rx for Harrington Challenger refill sent to [facility]  Chesley Mires, PharmD, MPH, BCPS Clinical Pharmacist (Rheumatology and Pulmonology)

## 2021-06-08 ENCOUNTER — Telehealth: Payer: Self-pay | Admitting: Pulmonary Disease

## 2021-06-08 NOTE — Telephone Encounter (Signed)
ATC patient to advise that Fasenra refill was sent yesterday. Left VM to advise. Also sent MyChart message  Chesley Mires, PharmD, MPH, BCPS Clinical Pharmacist (Rheumatology and Pulmonology)

## 2021-06-21 ENCOUNTER — Encounter: Payer: Self-pay | Admitting: Adult Health

## 2021-06-21 ENCOUNTER — Ambulatory Visit: Payer: Medicare Other | Admitting: Adult Health

## 2021-06-21 VITALS — BP 126/74 | HR 70 | Ht 69.0 in | Wt 219.0 lb

## 2021-06-21 DIAGNOSIS — I63412 Cerebral infarction due to embolism of left middle cerebral artery: Secondary | ICD-10-CM | POA: Diagnosis not present

## 2021-06-21 NOTE — Progress Notes (Signed)
I agree with the above plan 

## 2021-06-21 NOTE — Progress Notes (Signed)
[facility] [address] [place]. [place] [address] [phone]       OFFICE FOLLOW-UP NOTE  Nicholas Camacho Date of Birth:  13-Jun-1951 Medical Record Number:  [id number]    Reason for visit: Stroke follow-up  Chief Complaint  Patient presents with   Follow-up    RM 3 alone  Pt is well and stable        HPI:   Today, 06/21/2021, Nicholas Camacho returns for 69-month stroke follow-up unaccompanied.  Overall doing well.  Denies new or reoccurring stroke/TIA symptoms.  He continues to live with his wife and able to maintain ADL and IADLs independently. Reports occasional short term memory difficulties but he believes this is more due to old age.  Compliant on Eliquis 5 mg twice daily and atorvastatin 80 mg daily without side effects.  Blood pressures today 126/74.  No new concerns at this time.     History provided for reference purposes only Update 12/19/2020 JM: Nicholas Camacho returns for 9-month stroke follow-up unaccompanied  Mentions 2 occurrences of delayed word finding as well as mild short-term memory loss Denies any other associated symptoms or stroke/TIA symptoms Maintains ADLs and IADLs independently without difficulty Is routinely doing memory exercises at home with his family  Compliant on Eliquis 5 mg twice daily without any missed dosages (his daughter helps assist) -denies associated side effects Compliant on atorvastatin 80 mg daily -denies associated side effects Blood pressure today 139/79 - monitored at home and typically 120-130s/70s  No further concerns at this time  Initial visit 06/23/2020 Dr. Pearlean Brownie: Nicholas Camacho is a 70 year old Caucasian male seen today for initial office follow-up visit following hospital consultation for stroke.  History is obtained from the patient, review of electronic medical records and I personally reviewed available pertinent imaging films in PACS.  He has past medical history of hyperlipidemia,  hypertension, COPD, atrial fibrillation on Eliquis who presented to [facility] on 06/02/2020 with 3-day history of intermittent slurring of speech with 5 brief episodes.  Patient was supposed to be on Eliquis but admitted that he had missed a few doses in the prior week to his symptoms.  CT scan that was unremarkable MRI scan showed patchy left frontal MCA branch infarcts.  MRI of the brain showed no large vessel stenosis and carotid ultrasound was unremarkable.  2D echo also showed normal ejection fraction.  LDL cholesterol 41 mg percent and hemoglobin A1c was borderline at 6.4.  Patient states is done well since then he has been compliant with Eliquis and not missed a single dose.  He has had no further recurrent stroke or TIA symptoms.  He has been having some intermittent cough possibly from pneumonia versus bronchitis and following a bout of coughing is noted some transient numbness on the side of his right neck and shoulder which does not stay long.  He quit smoking 4 years ago.  Is tolerating his blood pressure medications and blood pressure today is 109/70.  He remains on Lipitor 80 mg and tolerating it well with muscle aches and pains.  He has no complaints today.   ROS:   14 system review of systems is positive for those listed in HPI and all other systems negative  PMH:  Past Medical History:  Diagnosis Date   COPD (chronic obstructive pulmonary disease) (HCC)    Disturbance of skin sensation 06/24/2013   Dysrhythmia    Elevated cholesterol    History of echocardiogram    Echo 4/17: EF  60-65%, no RWMA, Gr 2 DD   Hypertension    Sleep disturbance, unspecified 06/24/2013    Social History:  Social History   Socioeconomic History   Marital status: Married    Spouse name: Not on file   Number of children: 2   Years of education: COLLEGE   Highest education level: Not on file  Occupational History   Occupation: retired  Tobacco Use   Smoking status: Former    Packs/day:  1.50    Years: 45.00    Pack years: 67.50    Types: Cigarettes    Quit date: 11/19/2015    Years since quitting: 5.5   Smokeless tobacco: Never   Tobacco comments:    pt quit smoking!! 11/19/15  Vaping Use   Vaping Use: Never used  Substance and Sexual Activity   Alcohol use: No    Alcohol/week: 0.0 standard drinks   Drug use: No   Sexual activity: Not on file  Other Topics Concern   Not on file  Social History Narrative   Not on file   Social Determinants of Health   Financial Resource Strain: Not on file  Food Insecurity: Not on file  Transportation Needs: Not on file  Physical Activity: Not on file  Stress: Not on file  Social Connections: Not on file  Intimate Partner Violence: Not on file    Medications:   Current Outpatient Medications on File Prior to Visit  Medication Sig Dispense Refill   apixaban (ELIQUIS) 5 MG TABS tablet Take 1 tablet (5 mg total) by mouth 2 (two) times daily. 60 tablet 6   atorvastatin (LIPITOR) 80 MG tablet Take 1 tablet (80 mg total) by mouth daily. 90 tablet 3   Benralizumab (FASENRA PEN) 30 MG/ML SOAJ Inject 1 mL (30 mg total) into the skin every 8 (eight) weeks. 1 mL 1   Cyanocobalamin 1000 MCG CAPS Take 1,000 mcg by mouth daily.     diltiazem (CARDIZEM CD) 180 MG 24 hr capsule Take 1 capsule (180 mg total) by mouth daily. 90 capsule 2   fluticasone (FLONASE) 50 MCG/ACT nasal spray SHAKE LIQUID AND USE 2 SPRAYS IN EACH NOSTRIL TWICE DAILY 16 g 3   ipratropium (ATROVENT) 0.03 % nasal spray Place 2 sprays into both nostrils every 12 (twelve) hours. 30 mL 12   ipratropium-albuterol (DUONEB) 0.5-2.5 (3) MG/3ML SOLN USE 1 VIAL VIA NEBULIZER(3ML) THREE TIMES DAILY (Patient taking differently: 3 mLs in the morning and at bedtime.) 360 mL 11   losartan (COZAAR) 50 MG tablet TAKE 1 TABLET(50 MG) BY MOUTH DAILY 90 tablet 3   montelukast (SINGULAIR) 10 MG tablet Take 1 tablet (10 mg total) by mouth at bedtime. 30 tablet 11   Respiratory Therapy  Supplies (FLUTTER) DEVI 1 Device by Does not apply route in the morning and at bedtime. 1 each 0   TRELEGY ELLIPTA 100-62.5-25 MCG/INH AEPB INHALE 1 PUFF INTO THE LUNGS DAILY 60 each 4   VENTOLIN HFA 108 (90 Base) MCG/ACT inhaler INHALE 2 PUFFS BY MOUTH EVERY 6 HOURS AS NEEDED FOR WHEEZING OR SHORTNESS OF BREATH 54 g 0   No current facility-administered medications on file prior to visit.    Allergies:   Allergies  Allergen Reactions   Morphine And Related     HALLUCINATIONS    Physical Exam Today's Vitals   06/21/21 1235  BP: 126/74  Pulse: 70  Weight: 219 lb (99.3 kg)  Height: 5\' 9"  (1.753 m)   Body mass index is 32.34  kg/m.  General: Mildly obese middle-aged Caucasian male, seated, in no evident distress Head: head normocephalic and atraumatic.  Neck: supple with no carotid or supraclavicular bruits Cardiovascular: regular rate and rhythm, no murmurs Musculoskeletal: no deformity Skin:  no rash/petichiae Vascular:  Normal pulses all extremities  Neurologic Exam Mental Status: Awake and fully alert.  Fluent speech and language.  Oriented to place and time. Recent and remote memory intact. Attention span, concentration and fund of knowledge appropriate. Mood and affect appropriate.  Cranial Nerves: Pupils equal, briskly reactive to light. Extraocular movements full without nystagmus. Visual fields full to confrontation. Hearing intact. Facial sensation intact. Face, tongue, palate moves normally and symmetrically.  Motor: Normal bulk and tone. Normal strength in all tested extremity muscles. Sensory.: intact to touch ,pinprick .position and vibratory sensation.  Coordination: Rapid alternating movements normal in all extremities. Finger-to-nose and heel-to-shin performed accurately bilaterally. Gait and Station: Arises from chair without difficulty. Stance is normal. Gait demonstrates normal stride length and balance .  Difficulty with heel, toe and tandem walk Reflexes: 1+  and symmetric. Toes downgoing.       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  Additional vascular risk factors of hypertension and hyperlipidemia    L MCA stroke -Recovered well without residual deficits  -Continue Eliquis (apixaban) daily and atorvastatin 80mg  daily for secondary stroke prevention  -Discussed secondary stroke prevention measures and importance of close PCP follow-up for aggressive stroke risk factor management including HTN with BP goal<130/90 and HLD with LDL goal<70  Atrial fibrillation -On Eliquis 5 mg twice daily for CHA2DS2-VASc score of at least 5 -routinely followed by cardiology    Overall stable from stroke standpoint and recommend f/u as needed with routine f/u with PCP and cardiology to continue to manage stroke risk factors. Patient agreeable to plan    CC:  [facility] provider: Dr. , Dannielle Karvonen, MD   I spent 29 minutes of face-to-face and non-face-to-face time with patient.  This included previsit chart review, lab review, study review, electronic health record documentation, patient education and discussion regarding history of prior stroke, importance of managing stroke risk factors and answered all other questions to patient satisfaction  Clifton Custard, [facility]  [facility] [address] [place], [place]  Phone [phone] Fax [phone] Note: This document was prepared with digital dictation and possible smart phrase technology. Any transcriptional errors that result from this process are unintentional.

## 2021-06-21 NOTE — Patient Instructions (Addendum)
Continue Eliquis (apixaban) daily  and atorvastatin for secondary stroke prevention  Continue to follow with cardiology for atrial fibrillation and Eliquis management  Continue to follow up with PCP regarding cholesterol and blood pressure management  Maintain strict control of hypertension with blood pressure goal below 130/90 and cholesterol with LDL cholesterol (bad cholesterol) goal below 70 mg/dL.   And able to maintain ADLs majority of and able to maintain ADLs and majority of.  And     Thank you for coming to see [place] at [facility]. I hope we have been able to provide you high quality care today.  You may receive a patient satisfaction survey over the next few weeks. We would appreciate your feedback and comments so that we may continue to improve ourselves and the health of our patients. ADLs reports occasional short-term memory difficulties reports occasional from stroke standpoint

## 2021-06-30 ENCOUNTER — Other Ambulatory Visit: Payer: Self-pay

## 2021-06-30 ENCOUNTER — Encounter: Payer: Self-pay | Admitting: Pulmonary Disease

## 2021-06-30 ENCOUNTER — Ambulatory Visit: Payer: Medicare Other | Admitting: Pulmonary Disease

## 2021-06-30 VITALS — BP 112/68 | HR 75 | Ht 69.0 in | Wt 220.0 lb

## 2021-06-30 DIAGNOSIS — J449 Chronic obstructive pulmonary disease, unspecified: Secondary | ICD-10-CM

## 2021-06-30 DIAGNOSIS — J309 Allergic rhinitis, unspecified: Secondary | ICD-10-CM

## 2021-06-30 NOTE — Patient Instructions (Addendum)
Continue Trelegy Ellipta Inhaler 1 puff daily  Continue to use duoneb nebulizer treatment or albuterol inhaler as needed for cough, wheezing, or dyspnea.   Continue Fasenra injections every 8 weeks.   Use flonase nasal spray 2 sprays per nostril each day.  Use ipratropium nasal spray as needed for sinus congestion.

## 2021-06-30 NOTE — Progress Notes (Signed)
Synopsis: Referred in 2014 for COPD by Farris Has, MD.  Formerly a patient of Dr. Kendrick Fries and Dr. Chestine Spore.  Subjective:   PATIENT ID: Nicholas Camacho GENDER: male DOB: [birth year], MRN: [id number]  HPI  Chief Complaint  Patient presents with   Follow-up    3 mo f/u for asthma. Started Fasenra back in July 2022. Still struggling with a productive cough with green phlegm.    Nicholas Camacho is a 70 year old male, former smoker with COPD and nocturnal hypoxemia who returns to clinic for follow up.  He started [place] injections in July and has had 3 shots so far.  He has not required prednisone or antibiotics since starting Fasenra.  He reports that his wife has noted an improvement in his cough and breathing.  He does not report significant changes that he has noticed at this time.  He does continue to have a cough with green sputum.  He continues to use Trelegy Ellipta 1 puff daily.  He is also using DuoNeb nebulizer treatments along with flutter valve for mucus clearance.  He continues to take montelukast daily as well.  His sinus congestion and drainage is maintained using Zyrtec, fluticasone and ipratropium nasal sprays.  He has received his influenza vaccine as well as COVID booster recently.  Past Medical History:  Diagnosis Date   COPD (chronic obstructive pulmonary disease) (HCC)    Disturbance of skin sensation 06/24/2013   Dysrhythmia    Elevated cholesterol    History of echocardiogram    Echo 4/17: EF 60-65%, no RWMA, Gr 2 DD   Hypertension    Sleep disturbance, unspecified 06/24/2013     Family History  Problem Relation Age of Onset   Diabetes Mother    Prostate cancer Father    Kidney failure Father    Alcoholism Sister        history of   Drug abuse Sister        history of   Stroke Neg Hx      Social History   Socioeconomic History   Marital status: Married    Spouse name: Not on file   Number of children: 2   Years of education: COLLEGE    Highest education level: Not on file  Occupational History   Occupation: retired  Tobacco Use   Smoking status: Former    Packs/day: 1.50    Years: 45.00    Pack years: 67.50    Types: Cigarettes    Quit date: 11/19/2015    Years since quitting: 5.6   Smokeless tobacco: Never   Tobacco comments:    pt quit smoking!! 11/19/15  Vaping Use   Vaping Use: Never used  Substance and Sexual Activity   Alcohol use: No    Alcohol/week: 0.0 standard drinks   Drug use: No   Sexual activity: Not on file  Other Topics Concern   Not on file  Social History Narrative   Not on file   Social Determinants of Health   Financial Resource Strain: Not on file  Food Insecurity: Not on file  Transportation Needs: Not on file  Physical Activity: Not on file  Stress: Not on file  Social Connections: Not on file  Intimate Partner Violence: Not on file     Allergies  Allergen Reactions   Morphine And Related     HALLUCINATIONS     Outpatient Medications Prior to Visit  Medication Sig Dispense Refill   apixaban (ELIQUIS) 5 MG TABS tablet Take  1 tablet (5 mg total) by mouth 2 (two) times daily. 60 tablet 6   atorvastatin (LIPITOR) 80 MG tablet Take 1 tablet (80 mg total) by mouth daily. 90 tablet 3   Benralizumab (FASENRA PEN) 30 MG/ML SOAJ Inject 1 mL (30 mg total) into the skin every 8 (eight) weeks. 1 mL 1   Cyanocobalamin 1000 MCG CAPS Take 1,000 mcg by mouth daily.     diltiazem (CARDIZEM CD) 180 MG 24 hr capsule Take 1 capsule (180 mg total) by mouth daily. 90 capsule 2   fluticasone (FLONASE) 50 MCG/ACT nasal spray SHAKE LIQUID AND USE 2 SPRAYS IN EACH NOSTRIL TWICE DAILY 16 g 3   ipratropium (ATROVENT) 0.03 % nasal spray Place 2 sprays into both nostrils every 12 (twelve) hours. 30 mL 12   ipratropium-albuterol (DUONEB) 0.5-2.5 (3) MG/3ML SOLN USE 1 VIAL VIA NEBULIZER(3ML) THREE TIMES DAILY (Patient taking differently: 3 mLs in the morning and at bedtime.) 360 mL 11   losartan (COZAAR) 50  MG tablet TAKE 1 TABLET(50 MG) BY MOUTH DAILY 90 tablet 3   montelukast (SINGULAIR) 10 MG tablet Take 1 tablet (10 mg total) by mouth at bedtime. 30 tablet 11   Respiratory Therapy Supplies (FLUTTER) DEVI 1 Device by Does not apply route in the morning and at bedtime. 1 each 0   TRELEGY ELLIPTA 100-62.5-25 MCG/INH AEPB INHALE 1 PUFF INTO THE LUNGS DAILY 60 each 4   VENTOLIN HFA 108 (90 Base) MCG/ACT inhaler INHALE 2 PUFFS BY MOUTH EVERY 6 HOURS AS NEEDED FOR WHEEZING OR SHORTNESS OF BREATH 54 g 0   No facility-administered medications prior to visit.    Review of Systems  Constitutional:  Negative for chills, fever, malaise/fatigue and weight loss.  HENT:  Negative for congestion, sinus pain and sore throat.   Eyes: Negative.   Respiratory:  Positive for cough, sputum production and shortness of breath. Negative for hemoptysis and wheezing.   Cardiovascular:  Negative for chest pain, palpitations, orthopnea, claudication and leg swelling.  Gastrointestinal:  Negative for abdominal pain, heartburn, nausea and vomiting.  Genitourinary: Negative.   Musculoskeletal:  Negative for joint pain and myalgias.  Skin:  Negative for rash.  Neurological:  Negative for weakness.  Endo/Heme/Allergies: Negative.   Psychiatric/Behavioral: Negative.       Objective:   Vitals:   06/30/21 1026  BP: 112/68  Pulse: 75  SpO2: 97%  Weight: 220 lb (99.8 kg)  Height: 5\' 9"  (1.753 m)     Physical Exam Constitutional:      General: He is not in acute distress. HENT:     Head: Normocephalic and atraumatic.  Eyes:     Extraocular Movements: Extraocular movements intact.     Conjunctiva/sclera: Conjunctivae normal.     Pupils: Pupils are equal, round, and reactive to light.  Cardiovascular:     Rate and Rhythm: Normal rate and regular rhythm.     Pulses: Normal pulses.     Heart sounds: Normal heart sounds. No murmur heard. Pulmonary:     Effort: Pulmonary effort is normal.     Breath sounds: No  wheezing, rhonchi or rales.  Abdominal:     General: Bowel sounds are normal.     Palpations: Abdomen is soft.  Musculoskeletal:     Right lower leg: No edema.     Left lower leg: No edema.  Skin:    General: Skin is warm and dry.  Neurological:     General: No focal deficit present.  Mental Status: He is alert.    CBC    Component Value Date/Time   WBC 7.7 01/24/2021 1243   RBC 4.94 01/24/2021 1243   HGB 13.7 01/24/2021 1243   HGB 14.7 11/23/2019 1156   HCT 41.9 01/24/2021 1243   HCT 43.8 11/23/2019 1156   PLT 251.0 01/24/2021 1243   PLT 373 11/23/2019 1156   MCV 84.9 01/24/2021 1243   MCV 83 11/23/2019 1156   MCH 27.2 06/01/2020 1807   MCHC 32.6 01/24/2021 1243   RDW 16.0 (H) 01/24/2021 1243   RDW 13.2 11/23/2019 1156   LYMPHSABS 1.3 01/24/2021 1243   LYMPHSABS 1.7 11/23/2019 1156   MONOABS 0.7 01/24/2021 1243   EOSABS 0.2 01/24/2021 1243   EOSABS 0.3 11/23/2019 1156   BASOSABS 0.0 01/24/2021 1243   BASOSABS 0.0 11/23/2019 1156   BMP Latest Ref Rng & Units 04/17/2021 06/01/2020 11/23/2019  Glucose 65 - 99 mg/dL 740(C) 144(Y) 185(U)  BUN 8 - 27 mg/dL 19 12 13   Creatinine 0.76 - 1.27 mg/dL 3.14 9.70  BUN/Creat Ratio 10 - 24 18 - 12  Sodium 134 - 144 mmol/L 140 139 140  Potassium 3.5 - 5.2 mmol/L 4.3 4.2 4.3  Chloride 96 - 106 mmol/L 101 104 104  CO2 20 - 29 mmol/L 23 26 21   Calcium 8.6 - 10.2 mg/dL 9.7 9.5 9.3   Chest imaging: CT Chest 12/07/20 Lung-RADS 2, benign appearance or behavior. Continue annual screening with low-dose chest CT without contrast in 12 months.   Stable tree-in-bud nodularity in the upper lobes likely reflect chronic atypical mycobacterial infection or post infectious inflammatory scarring.   Aortic Atherosclerosis (ICD10-I70.0) and Emphysema (ICD10-J43.9).  PFT: PFT Results Latest Ref Rng & Units 11/18/2019 10/23/2013  FVC-Pre L 3.12 4.11  FVC-Predicted Pre % 74 93  FVC-Post L 2.85 4.12  FVC-Predicted Post % 67 94  Pre FEV1/FVC  % % 53 58  Post FEV1/FCV % % 54 58  FEV1-Pre L 1.64 2.38  FEV1-Predicted Pre % 52 72  FEV1-Post L 1.54 2.41  DLCO uncorrected ml/min/mmHg 19.63 21.40  DLCO UNC% % 79 72  DLVA Predicted % 99 72  TLC L 7.38 8.64  TLC % Predicted % 111 130  RV % Predicted % 187 206    2021: Moderate obstruction without bronchodilator reversibility. Significant air trapping without hyperinflation.  Mildly reduced diffusion.   2015: Moderate obstruction with hyperinflation, no significant bronchodilator reversibility.  Mild diffusion impairment.  Echo 06/02/20: 1. Left ventricular ejection fraction, by estimation, is 55 to 60%. The  left ventricle has normal function. The left ventricle has no regional  wall motion abnormalities. Left ventricular diastolic parameters are  consistent with Grade I diastolic  dysfunction (impaired relaxation).   2. Right ventricular systolic function is normal. The right ventricular  size is normal.   3. The mitral valve is normal in structure. Trivial mitral valve  regurgitation. No evidence of mitral stenosis.   4. The aortic valve was not well visualized. Aortic valve regurgitation  is mild. Mild aortic valve sclerosis is present, with no evidence of  aortic valve stenosis.   5. The inferior vena cava is normal in size with greater than 50%  respiratory variability, suggesting right atrial pressure of 3 mmHg.   Heart Catheterization 08/30/2017: The left ventricular systolic function is normal. LV end diastolic pressure is normal. The left ventricular ejection fraction is 55-65% by visual estimate. There is no mitral valve regurgitation. Prox RCA lesion is 40% stenosed. Prox RCA  to Mid RCA lesion is 50% stenosed. Mid RCA-1 lesion is 30% stenosed. Mid RCA-2 lesion is 50% stenosed. Prox Cx lesion is 40% stenosed. Ost LAD to Prox LAD lesion is 30% stenosed.   1. Moderate non-obstructive, calcific stenoses in the RCA 2. Mild non-obstructive, calcific stenoses in the  LAD and Circumflex 3. Normal LV systolic function   Recommendations: Medical management of CAD. Tobacco cessation.   Assessment & Plan:   Asthma-COPD overlap syndrome (HCC)  Allergic rhinitis, unspecified seasonality, unspecified trigger  Discussion: Nicholas Camacho is a 70 year old male, former smoker with asthma-COPD overlap syndrome and nocturnal hypoxemia who returns to clinic for follow up.   He is currently being treated with [place] Challenger and Trelegy Ellipta 1 puff daily.  His lungs are clear on examination today and he has not required prednisone or antibiotics since initiation of Fasenra in early July.  We will continue these therapies along with as needed DuoNeb nebulizer treatments and flutter valve therapy for airway clearance.  He is being followed with our lung cancer screening program for annual CT scans.  He is to continue Flonase nasal spray 2 sprays per nostril each day for nasal congestion and he can use ipratropium nasal spray as needed.  He is to follow up in  6 months.  Melody Comas, MD [facility] Office: [phone]    Current Outpatient Medications:    apixaban (ELIQUIS) 5 MG TABS tablet, Take 1 tablet (5 mg total) by mouth 2 (two) times daily., Disp: 60 tablet, Rfl: 6   atorvastatin (LIPITOR) 80 MG tablet, Take 1 tablet (80 mg total) by mouth daily., Disp: 90 tablet, Rfl: 3   Benralizumab (FASENRA PEN) 30 MG/ML SOAJ, Inject 1 mL (30 mg total) into the skin every 8 (eight) weeks., Disp: 1 mL, Rfl: 1   Cyanocobalamin 1000 MCG CAPS, Take 1,000 mcg by mouth daily., Disp: , Rfl:    diltiazem (CARDIZEM CD) 180 MG 24 hr capsule, Take 1 capsule (180 mg total) by mouth daily., Disp: 90 capsule, Rfl: 2   fluticasone (FLONASE) 50 MCG/ACT nasal spray, SHAKE LIQUID AND USE 2 SPRAYS IN EACH NOSTRIL TWICE DAILY, Disp: 16 g, Rfl: 3   ipratropium (ATROVENT) 0.03 % nasal spray, Place 2 sprays into both nostrils every 12 (twelve) hours., Disp: 30 mL,  Rfl: 12   ipratropium-albuterol (DUONEB) 0.5-2.5 (3) MG/3ML SOLN, USE 1 VIAL VIA NEBULIZER(3ML) THREE TIMES DAILY (Patient taking differently: 3 mLs in the morning and at bedtime.), Disp: 360 mL, Rfl: 11   losartan (COZAAR) 50 MG tablet, TAKE 1 TABLET(50 MG) BY MOUTH DAILY, Disp: 90 tablet, Rfl: 3   montelukast (SINGULAIR) 10 MG tablet, Take 1 tablet (10 mg total) by mouth at bedtime., Disp: 30 tablet, Rfl: 11   Respiratory Therapy Supplies (FLUTTER) DEVI, 1 Device by Does not apply route in the morning and at bedtime., Disp: 1 each, Rfl: 0   TRELEGY ELLIPTA 100-62.5-25 MCG/INH AEPB, INHALE 1 PUFF INTO THE LUNGS DAILY, Disp: 60 each, Rfl: 4   VENTOLIN HFA 108 (90 Base) MCG/ACT inhaler, INHALE 2 PUFFS BY MOUTH EVERY 6 HOURS AS NEEDED FOR WHEEZING OR SHORTNESS OF BREATH, Disp: 54 g, Rfl: 0

## 2021-07-20 ENCOUNTER — Other Ambulatory Visit: Payer: Self-pay | Admitting: Pulmonary Disease

## 2021-07-21 ENCOUNTER — Telehealth: Payer: Self-pay | Admitting: Pulmonary Disease

## 2021-07-21 ENCOUNTER — Other Ambulatory Visit: Payer: Self-pay

## 2021-07-21 MED ORDER — VENTOLIN HFA 108 (90 BASE) MCG/ACT IN AERS: INHALATION_SPRAY | RESPIRATORY_TRACT | 12 refills | Status: DC

## 2021-07-21 NOTE — Telephone Encounter (Signed)
Called and spoke with the pharmacy, I was advised that they did receive the script for the Ventolin inhaler, they have one ready and the others will be in on Monday.  Called patient and made him aware that one inhaler is ready and the others will be ready on Monday.  He verbalized understanding.  Nothing further needed.

## 2021-08-21 DIAGNOSIS — Z1211 Encounter for screening for malignant neoplasm of colon: Secondary | ICD-10-CM | POA: Diagnosis not present

## 2021-08-30 DIAGNOSIS — E1169 Type 2 diabetes mellitus with other specified complication: Secondary | ICD-10-CM | POA: Diagnosis not present

## 2021-08-30 DIAGNOSIS — E785 Hyperlipidemia, unspecified: Secondary | ICD-10-CM | POA: Diagnosis not present

## 2021-08-30 DIAGNOSIS — I10 Essential (primary) hypertension: Secondary | ICD-10-CM | POA: Diagnosis not present

## 2021-08-30 DIAGNOSIS — I4891 Unspecified atrial fibrillation: Secondary | ICD-10-CM | POA: Diagnosis not present

## 2021-09-12 ENCOUNTER — Other Ambulatory Visit: Payer: Self-pay

## 2021-09-12 DIAGNOSIS — I10 Essential (primary) hypertension: Secondary | ICD-10-CM

## 2021-09-12 DIAGNOSIS — I480 Paroxysmal atrial fibrillation: Secondary | ICD-10-CM

## 2021-09-12 MED ORDER — DILTIAZEM HCL ER COATED BEADS 180 MG PO CP24
180.0000 mg | ORAL_CAPSULE | Freq: Every day | ORAL | 2 refills | Status: DC
Start: 2021-09-12 — End: 2022-06-11

## 2021-09-21 ENCOUNTER — Other Ambulatory Visit: Payer: Self-pay | Admitting: *Deleted

## 2021-09-21 ENCOUNTER — Other Ambulatory Visit: Payer: Self-pay | Admitting: Critical Care Medicine

## 2021-09-21 DIAGNOSIS — J449 Chronic obstructive pulmonary disease, unspecified: Secondary | ICD-10-CM

## 2021-09-21 DIAGNOSIS — I480 Paroxysmal atrial fibrillation: Secondary | ICD-10-CM

## 2021-09-21 MED ORDER — APIXABAN 5 MG PO TABS: 5.0000 mg | ORAL_TABLET | Freq: Two times a day (BID) | ORAL | 6 refills | Status: DC

## 2021-09-21 NOTE — Telephone Encounter (Signed)
Eliquis 5mg  refill request received. Patient is 70 years old, weight-99.8kg, Crea-1.03 on 04/17/2021, Diagnosis-Afib, and last seen by Dr. 04/19/2021 on 04/17/2021. Dose is appropriate based on dosing criteria. Will send in refill to requested pharmacy.

## 2021-09-28 ENCOUNTER — Telehealth: Payer: Self-pay | Admitting: Pharmacist

## 2021-09-28 DIAGNOSIS — J449 Chronic obstructive pulmonary disease, unspecified: Secondary | ICD-10-CM

## 2021-09-29 ENCOUNTER — Telehealth: Payer: Self-pay | Admitting: Pulmonary Disease

## 2021-09-29 ENCOUNTER — Encounter: Payer: Self-pay | Admitting: Pulmonary Disease

## 2021-09-29 ENCOUNTER — Telehealth (INDEPENDENT_AMBULATORY_CARE_PROVIDER_SITE_OTHER): Payer: Medicare Other | Admitting: Pulmonary Disease

## 2021-09-29 DIAGNOSIS — R051 Acute cough: Secondary | ICD-10-CM | POA: Diagnosis not present

## 2021-09-29 DIAGNOSIS — R509 Fever, unspecified: Secondary | ICD-10-CM

## 2021-09-29 DIAGNOSIS — J069 Acute upper respiratory infection, unspecified: Secondary | ICD-10-CM

## 2021-09-29 LAB — POC COVID19 BINAXNOW: SARS Coronavirus 2 Ag: NEGATIVE

## 2021-09-29 LAB — POCT INFLUENZA A/B
Influenza A, POC: NEGATIVE
Influenza B, POC: NEGATIVE

## 2021-09-29 MED ORDER — PREDNISONE 10 MG PO TABS: ORAL_TABLET | ORAL | 0 refills | Status: DC

## 2021-09-29 MED ORDER — AZITHROMYCIN 250 MG PO TABS: ORAL_TABLET | ORAL | 0 refills | Status: DC

## 2021-09-29 NOTE — Progress Notes (Signed)
Virtual Visit via Video Note  I connected with Nicholas Camacho on 09/29/21 at 10:15 AM EST by a video enabled telemedicine application and verified that I am speaking with the correct person using two identifiers.  Location: Patient: Home Provider: Clinic   I discussed the limitations of evaluation and management by telemedicine and the availability of in person appointments. The patient expressed understanding and agreed to proceed.  History of Present Illness: Nicholas Camacho is a 70 year old male, former smoker with COPD and nocturnal hypoxemia who returns to clinic for acute visit.  He reports developing fever up to 102.94 nights ago.  He was still running a fever last night of 100.9.  He has a dry cough and denies any congestion.  He denies any wheezing.  He does have increased shortness of breath and his SPO2 is 89 to 90% on room air.  He feels puny in general.  He has maintained good appetite.  No changes to his smell or taste.  He does report a positive sick contact who had COVID.  He has tested negative for COVID on multiple home tests.  Observations/Objective: Elderly male, no acute distress Tired/fatigued appearing  Assessment and Plan: Nicholas Camacho is a 70 year old male, former smoker with COPD and nocturnal hypoxemia who returns to clinic for acute visit.  He was instructed to come into the clinic for rapid flu/COVID testing which was negative.  He does not require any antiviral therapy at this time.  We will send in prednisone and azithromycin for concern of COPD exacerbation due to upper respiratory viral infection.  He has sent a message to [facility] as he reports he is out of fasenra refills.  Follow Up Instructions: Please contact our clinic if your symptoms worsen.    I discussed the assessment and treatment plan with the patient. The patient was provided an opportunity to ask questions and all were answered. The patient agreed with the plan and demonstrated an  understanding of the instructions.   The patient was advised to call back or seek an in-person evaluation if the symptoms worsen or if the condition fails to improve as anticipated.  I provided 25 minutes of non-face-to-face time during this encounter.   Martina Sinner, MD

## 2021-09-29 NOTE — Patient Instructions (Signed)
We will arrange for you to have a covid/flu test to determine further treatment management.  Follow up as scheduled

## 2021-09-29 NOTE — Telephone Encounter (Signed)
Called and spoke with pt about his call and stated to him that we need to schedule a mychart visit. Pt verbalized understanding. Appt has been scheduled for pt. Nothing further needed.

## 2021-10-03 MED ORDER — FASENRA PEN 30 MG/ML ~~LOC~~ SOAJ: 30.0000 mg | SUBCUTANEOUS | 0 refills | Status: DC

## 2021-10-03 NOTE — Telephone Encounter (Signed)
Refill sent for [facility] to [facility]  Dose: 30 mg SQ every 8 weeks  Last OV: 06/30/21 Provider: Dr. Francine Graven  Next OV: not scheduled  Chesley Mires, PharmD, MPH, BCPS Clinical Pharmacist (Rheumatology and Pulmonology)

## 2021-10-04 ENCOUNTER — Other Ambulatory Visit: Payer: Self-pay | Admitting: Pulmonary Disease

## 2021-10-04 DIAGNOSIS — J449 Chronic obstructive pulmonary disease, unspecified: Secondary | ICD-10-CM

## 2021-10-05 MED ORDER — FASENRA PEN 30 MG/ML ~~LOC~~ SOAJ: 30.0000 mg | SUBCUTANEOUS | 0 refills | Status: DC

## 2021-10-09 DIAGNOSIS — E1169 Type 2 diabetes mellitus with other specified complication: Secondary | ICD-10-CM | POA: Diagnosis not present

## 2021-10-09 DIAGNOSIS — I10 Essential (primary) hypertension: Secondary | ICD-10-CM | POA: Diagnosis not present

## 2021-10-09 DIAGNOSIS — E785 Hyperlipidemia, unspecified: Secondary | ICD-10-CM | POA: Diagnosis not present

## 2021-10-09 DIAGNOSIS — J449 Chronic obstructive pulmonary disease, unspecified: Secondary | ICD-10-CM | POA: Diagnosis not present

## 2021-10-21 ENCOUNTER — Other Ambulatory Visit: Payer: Self-pay | Admitting: Pulmonary Disease

## 2021-10-24 ENCOUNTER — Telehealth: Payer: Self-pay | Admitting: Pulmonary Disease

## 2021-10-24 DIAGNOSIS — I4891 Unspecified atrial fibrillation: Secondary | ICD-10-CM | POA: Diagnosis not present

## 2021-10-24 DIAGNOSIS — E1169 Type 2 diabetes mellitus with other specified complication: Secondary | ICD-10-CM | POA: Diagnosis not present

## 2021-10-24 DIAGNOSIS — R400 Somnolence: Secondary | ICD-10-CM | POA: Diagnosis not present

## 2021-10-24 DIAGNOSIS — E785 Hyperlipidemia, unspecified: Secondary | ICD-10-CM | POA: Diagnosis not present

## 2021-10-24 DIAGNOSIS — N390 Urinary tract infection, site not specified: Secondary | ICD-10-CM | POA: Diagnosis not present

## 2021-10-24 DIAGNOSIS — I10 Essential (primary) hypertension: Secondary | ICD-10-CM | POA: Diagnosis not present

## 2021-10-24 NOTE — Telephone Encounter (Signed)
Trelegy prescription was sent earlier today to requested [facility]. Patient is aware prescription has been sent to pharmacy. Nothing further at this time.

## 2021-11-15 ENCOUNTER — Telehealth: Payer: Self-pay | Admitting: Pulmonary Disease

## 2021-11-15 DIAGNOSIS — J449 Chronic obstructive pulmonary disease, unspecified: Secondary | ICD-10-CM

## 2021-11-17 MED ORDER — FASENRA PEN 30 MG/ML ~~LOC~~ SOAJ: 30.0000 mg | SUBCUTANEOUS | 1 refills | Status: DC

## 2021-11-17 NOTE — Telephone Encounter (Signed)
Refill sent for [facility] to  [facility]  Dose: 30 mg SQ every 8 weeks  Last OV: 06/30/21 Provider: Dr. Francine Graven  Next OV: not scheduled, but supposed to be in March/April 2023  Chesley Mires, PharmD, MPH, BCPS Clinical Pharmacist (Rheumatology and Pulmonology)

## 2021-11-20 ENCOUNTER — Other Ambulatory Visit: Payer: Self-pay

## 2021-11-20 MED ORDER — LOSARTAN POTASSIUM 50 MG PO TABS: ORAL_TABLET | ORAL | 1 refills | Status: DC

## 2021-11-24 NOTE — Telephone Encounter (Signed)
Patient is scheduled on 12/26/2021 at 10:30am with Dr. Francine Graven.

## 2021-12-26 ENCOUNTER — Encounter: Payer: Self-pay | Admitting: Pulmonary Disease

## 2021-12-26 ENCOUNTER — Other Ambulatory Visit: Payer: Self-pay

## 2021-12-26 ENCOUNTER — Ambulatory Visit: Payer: Medicare Other | Admitting: Pulmonary Disease

## 2021-12-26 VITALS — BP 120/64 | HR 75 | Ht 69.0 in | Wt 212.2 lb

## 2021-12-26 DIAGNOSIS — J449 Chronic obstructive pulmonary disease, unspecified: Secondary | ICD-10-CM

## 2021-12-26 NOTE — Progress Notes (Signed)
? ?Synopsis: Referred in 2014 for COPD by Nicholas Has, MD.  Formerly a patient of Dr. Kendrick Camacho and Dr. Chestine Camacho. ? ?Subjective:  ? ?PATIENT ID: Nicholas Camacho GENDER: male DOB: Nov 01, 1950, MRN: [id number] ? ?HPI ? ?Chief Complaint  ?Patient presents with  ? Follow-up  ?  2 mo f/u for asthma. States he Camacho been doing well since last visit. Still Fasenra.   ? ?Nicholas Camacho is a 71 year old male, former smoker with asthma-COPD overlap syndrome and nocturnal hypoxemia who returns to clinic for follow up. ? ?He reports doing well since starting fasenra. He Camacho only required prednisone once since starting fasenra. He denies any acute issues at this time. He continues on trelegy ellipta daily and singulair.  ? ?OV 06/30/21 ?He started [place] injections in July and Camacho had 3 shots so far.  He Camacho not required prednisone or antibiotics since starting Fasenra.  He reports that his wife Camacho noted an improvement in his cough and breathing.  He does not report significant changes that he Camacho noticed at this time.  He does continue to have a cough with green sputum. ? ?He continues to use Trelegy Ellipta 1 puff daily.  He is also using DuoNeb nebulizer treatments along with flutter valve for mucus clearance.  He continues to take montelukast daily as well. ? ?His sinus congestion and drainage is maintained using Zyrtec, fluticasone and ipratropium nasal sprays. ? ?He Camacho received his influenza vaccine as well as COVID booster recently. ? ?Past Medical History:  ?Diagnosis Date  ? COPD (chronic obstructive pulmonary disease) (HCC)   ? Disturbance of skin sensation 06/24/2013  ? Dysrhythmia   ? Elevated cholesterol   ? History of echocardiogram   ? Echo 4/17: EF 60-65%, no RWMA, Gr 2 DD  ? Hypertension   ? Sleep disturbance, unspecified 06/24/2013  ?  ? ?Family History  ?Problem Relation Age of Onset  ? Diabetes Mother   ? Prostate cancer Father   ? Kidney failure Father   ? Alcoholism Sister   ?     history of  ? Drug abuse  Sister   ?     history of  ? Stroke Neg Hx   ?  ? ?Social History  ? ?Socioeconomic History  ? Marital status: Married  ?  Spouse name: Not on file  ? Number of children: 2  ? Years of education: COLLEGE  ? Highest education level: Not on file  ?Occupational History  ? Occupation: retired  ?Tobacco Use  ? Smoking status: Former  ?  Packs/day: 1.50  ?  Years: 45.00  ?  Pack years: 67.50  ?  Types: Cigarettes  ?  Quit date: 11/19/2015  ?  Years since quitting: 6.1  ? Smokeless tobacco: Never  ? Tobacco comments:  ?  pt quit smoking!! 11/19/15  ?Vaping Use  ? Vaping Use: Never used  ?Substance and Sexual Activity  ? Alcohol use: No  ?  Alcohol/week: 0.0 standard drinks  ? Drug use: No  ? Sexual activity: Not on file  ?Other Topics Concern  ? Not on file  ?Social History Narrative  ? Not on file  ? ?Social Determinants of Health  ? ?Financial Resource Strain: Not on file  ?Food Insecurity: Not on file  ?Transportation Needs: Not on file  ?Physical Activity: Not on file  ?Stress: Not on file  ?Social Connections: Not on file  ?Intimate Partner Violence: Not on file  ?  ? ?Allergies  ?Allergen  Reactions  ? Morphine And Related   ?  HALLUCINATIONS  ?  ? ?Outpatient Medications Prior to Visit  ?Medication Sig Dispense Refill  ? apixaban (ELIQUIS) 5 MG TABS tablet Take 1 tablet (5 mg total) by mouth 2 (two) times daily. 60 tablet 6  ? atorvastatin (LIPITOR) 80 MG tablet Take 1 tablet (80 mg total) by mouth daily. 90 tablet 3  ? Benralizumab (FASENRA PEN) 30 MG/ML SOAJ Inject 1 mL (30 mg total) into the skin every 8 (eight) weeks. 1 mL 1  ? Cyanocobalamin 1000 MCG CAPS Take 1,000 mcg by mouth daily.    ? diltiazem (CARDIZEM CD) 180 MG 24 hr capsule Take 1 capsule (180 mg total) by mouth daily. 90 capsule 2  ? fluticasone (FLONASE) 50 MCG/ACT nasal spray SHAKE LIQUID AND USE 2 SPRAYS IN EACH NOSTRIL TWICE DAILY 16 g 3  ? ipratropium (ATROVENT) 0.03 % nasal spray Place 2 sprays into both nostrils every 12 (twelve) hours. 30 mL 12   ? ipratropium-albuterol (DUONEB) 0.5-2.5 (3) MG/3ML SOLN USE 1 VIAL VIA NEBULIZER(3ML) THREE TIMES DAILY (Patient taking differently: 3 mLs in the morning and at bedtime.) 360 mL 11  ? losartan (COZAAR) 50 MG tablet TAKE 1 TABLET(50 MG) BY MOUTH DAILY 90 tablet 1  ? montelukast (SINGULAIR) 10 MG tablet Take 1 tablet (10 mg total) by mouth at bedtime. 30 tablet 11  ? Respiratory Therapy Supplies (FLUTTER) DEVI 1 Device by Does not apply route in the morning and at bedtime. 1 each 0  ? TRELEGY ELLIPTA 100-62.5-25 MCG/ACT AEPB INHALE 1 PUFF INTO THE LUNGS DAILY 60 each 4  ? VENTOLIN HFA 108 (90 Base) MCG/ACT inhaler INHALE 2 PUFFS BY MOUTH EVERY 6 HOURS AS NEEDED FOR WHEEZING OR SHORTNESS OF BREATH 18 g 12  ? azithromycin (ZITHROMAX) 250 MG tablet Take 2 tablets on first day, then 1 tablet daily until finished. 6 tablet 0  ? predniSONE (DELTASONE) 10 MG tablet Take 4 tabs by mouth for 3 days, then 3 for 3 days, 2 for 3 days, 1 for 3 days and stop 30 tablet 0  ? ?No facility-administered medications prior to visit.  ? ? ?Review of Systems  ?Constitutional:  Negative for chills, fever, malaise/fatigue and weight loss.  ?HENT:  Negative for congestion, sinus pain and sore throat.   ?Eyes: Negative.   ?Respiratory:  Negative for cough, hemoptysis, sputum production, shortness of breath and wheezing.   ?Cardiovascular:  Negative for chest pain, palpitations, orthopnea, claudication and leg swelling.  ?Gastrointestinal:  Negative for abdominal pain, heartburn, nausea and vomiting.  ?Genitourinary: Negative.   ?Musculoskeletal:  Negative for joint pain and myalgias.  ?Skin:  Negative for rash.  ?Neurological:  Negative for weakness.  ?Endo/Heme/Allergies: Negative.   ?Psychiatric/Behavioral: Negative.    ? ? ? ?Objective:  ? ?Vitals:  ? 12/26/21 1028  ?BP: 120/64  ?Pulse: 75  ?SpO2: 96%  ?Weight: 212 lb 3.2 oz (96.3 kg)  ?Height: 5\' 9"  (1.753 m)  ? ?Physical Exam ?Constitutional:   ?   General: He is not in acute  distress. ?HENT:  ?   Head: Normocephalic and atraumatic.  ?Eyes:  ?   Conjunctiva/sclera: Conjunctivae normal.  ?Cardiovascular:  ?   Rate and Rhythm: Normal rate and regular rhythm.  ?   Pulses: Normal pulses.  ?   Heart sounds: Normal heart sounds. No murmur heard. ?Pulmonary:  ?   Effort: Pulmonary effort is normal.  ?   Breath sounds: No wheezing, rhonchi or rales.  ?  Musculoskeletal:  ?   Right lower leg: No edema.  ?   Left lower leg: No edema.  ?Skin: ?   General: Skin is warm and dry.  ?Neurological:  ?   General: No focal deficit present.  ?   Mental Status: He is alert.  ? ? ?CBC ?   ?Component Value Date/Time  ? WBC 7.7 01/24/2021 1243  ? RBC 4.94 01/24/2021 1243  ? HGB 13.7 01/24/2021 1243  ? HGB 14.7 11/23/2019 1156  ? HCT 41.9 01/24/2021 1243  ? HCT 43.8 11/23/2019 1156  ? PLT 251.0 01/24/2021 1243  ? PLT 373 11/23/2019 1156  ? MCV 84.9 01/24/2021 1243  ? MCV 83 11/23/2019 1156  ? MCH 27.2 06/01/2020 1807  ? MCHC 32.6 01/24/2021 1243  ? RDW 16.0 (H) 01/24/2021 1243  ? RDW 13.2 11/23/2019 1156  ? LYMPHSABS 1.3 01/24/2021 1243  ? LYMPHSABS 1.7 11/23/2019 1156  ? MONOABS 0.7 01/24/2021 1243  ? EOSABS 0.2 01/24/2021 1243  ? EOSABS 0.3 11/23/2019 1156  ? BASOSABS 0.0 01/24/2021 1243  ? BASOSABS 0.0 11/23/2019 1156  ? ? ?  Latest Ref Rng & Units 04/17/2021  ? 12:01 PM 06/01/2020  ?  6:07 PM 11/23/2019  ? 11:56 AM  ?BMP  ?Glucose 65 - 99 mg/dL 161157   096136   045132    ?BUN 8 - 27 mg/dL 19   12   13     ?Creatinine 0.76 - 1.27 mg/dL 4.091.03   8.111.02   9.141.13    ?BUN/Creat Ratio 10 - 24 18    12     ?Sodium 134 - 144 mmol/L 140   139   140    ?Potassium 3.5 - 5.2 mmol/L 4.3   4.2   4.3    ?Chloride 96 - 106 mmol/L 101   104   104    ?CO2 20 - 29 mmol/L 23   26   21     ?Calcium 8.6 - 10.2 mg/dL 9.7   9.5   9.3    ? ?Chest imaging: ?CT Chest 12/07/20 ?Lung-RADS 2, benign appearance or behavior. Continue annual ?screening with low-dose chest CT without contrast in 12 months. ?  ?Stable tree-in-bud nodularity in the upper lobes likely  reflect ?chronic atypical mycobacterial infection or post infectious ?inflammatory scarring. ?  ?Aortic Atherosclerosis (ICD10-I70.0) and Emphysema (ICD10-J43.9). ? ?PFT: ? ?  Latest Ref Rng & Units 11/18/2019  ?

## 2021-12-26 NOTE — Patient Instructions (Addendum)
Continue Trelegy Ellipta Inhaler 1 puff daily ?  ?Continue to use duoneb nebulizer treatment or albuterol inhaler as needed for cough, wheezing, or dyspnea.  ?  ?Continue Fasenra injections every 8 weeks.  ?  ?Use flonase nasal spray 2 sprays per nostril each day. ?  ?Use ipratropium nasal spray as needed for sinus congestion.  ? ?Follow up in 4 months with Spirometry ?

## 2022-01-09 ENCOUNTER — Encounter: Payer: Self-pay | Admitting: Pulmonary Disease

## 2022-01-12 ENCOUNTER — Telehealth: Payer: Self-pay | Admitting: Pulmonary Disease

## 2022-01-12 ENCOUNTER — Other Ambulatory Visit: Payer: Self-pay | Admitting: Pulmonary Disease

## 2022-01-12 DIAGNOSIS — J309 Allergic rhinitis, unspecified: Secondary | ICD-10-CM

## 2022-01-16 ENCOUNTER — Telehealth: Payer: Self-pay | Admitting: Pulmonary Disease

## 2022-01-16 DIAGNOSIS — J449 Chronic obstructive pulmonary disease, unspecified: Secondary | ICD-10-CM

## 2022-01-16 DIAGNOSIS — U071 COVID-19: Secondary | ICD-10-CM | POA: Diagnosis not present

## 2022-01-19 MED ORDER — FASENRA PEN 30 MG/ML ~~LOC~~ SOAJ: 30.0000 mg | SUBCUTANEOUS | 2 refills | Status: DC

## 2022-01-19 NOTE — Telephone Encounter (Signed)
Refill sent for [facility] to  [facility] ? ?Dose: 30 mg SQ every 8 weeks ? ?Last OV: 12/26/21 ?Provider: Dr. Erin Fulling ? ?Next OV: 05/04/22 ? ?Knox Saliva, PharmD, MPH, BCPS ?Clinical Pharmacist (Rheumatology and Pulmonology)  ?

## 2022-01-19 NOTE — Telephone Encounter (Signed)
Rx for montelukast was sent to pharmacy for pt 4/14. Attempted to call pt to let him know this had been done but unable to reach. Left pt a detailed message letting him know this and stated to him in the VM to call office if there were any problems trying to get the med. Nothing further needed. ?

## 2022-02-06 NOTE — Progress Notes (Deleted)
?Cardiology Office Note:   ? ?Date:  02/06/2022  ? ?ID:  Nicholas Camacho, DOB 08/28/1951, MRN [id number] ? ?PCP:  Farris Has, MD ?  ?[facility] Providers ?Cardiologist:  Meriam Sprague, MD { ? ? ?Referring MD: Farris Has, MD  ? ? ?History of Present Illness:   ? ?Nicholas Camacho is a 71 y.o. male with a hx of pAfib on apixaban, HTN, HLD, COPD, OSA, small AAA, and iliac aneurysm who was previously followed by Dr. Delton See who now presents to clinic for follow-up. ? ?Per review of the record, the patient underwent coronary CTA 08/20/17 for mild fatigue on exertion showed calcium score of 3244 which was the 99th percentile for age and sex.  Moderate stenosis in the LAD and RCA with suspicion for severe stenosis in the mid RCA.  Cardiac catheterization 08/30/17 showed moderate nonobstructive calcific stenosis in the RCA with mild nonobstructive calcific stenosis in the LAD and circumflex normal LV function.  Medical management was recommended as well as smoking cessation.   ? ?Patient suffered a stroke in 06/2020 due to missed doses of eliquis. His head CT did not show any changes other than chronic microvascular changes.  Brain MRI showed Cluster of small acute infarctions at the left frontoparietal junction region, consistent with left MCA branch vessel insult. No large confluent infarction. No swelling or hemorrhage. 2. Mild to moderate chronic small-vessel ischemic changes of the cerebral hemispheric white matter. ? ?Was last seen 04/21/21 where he was doing well. Recovered from his CVA. No CV symptoms. ? ?Today, *** ? ?Past Medical History:  ?Diagnosis Date  ? COPD (chronic obstructive pulmonary disease) (HCC)   ? Disturbance of skin sensation 06/24/2013  ? Dysrhythmia   ? Elevated cholesterol   ? History of echocardiogram   ? Echo 4/17: EF 60-65%, no RWMA, Gr 2 DD  ? Hypertension   ? Sleep disturbance, unspecified 06/24/2013  ? ? ?Past Surgical History:  ?Procedure Laterality Date  ?  ESOPHAGOGASTRODUODENOSCOPY N/A 01/08/2016  ? Procedure: ESOPHAGOGASTRODUODENOSCOPY (EGD);  Surgeon: Carman Ching, MD;  Location: [facility] ENDOSCOPY;  Service: Endoscopy;  Laterality: N/A;  ? fractured pelvis    ? INGUINAL HERNIA REPAIR Bilateral   ? LEFT HEART CATH AND CORONARY ANGIOGRAPHY N/A 08/30/2017  ? Procedure: LEFT HEART CATH AND CORONARY ANGIOGRAPHY;  Surgeon: Kathleene Hazel, MD;  Location: [facility] INVASIVE CV LAB;  Service: Cardiovascular;  Laterality: N/A;  ? reconstruct left heel  2002  ? TONSILLECTOMY    ? ? ?Current Medications: ?No outpatient medications have been marked as taking for the 02/09/22 encounter (Appointment) with Meriam Sprague, MD.  ?  ? ?Allergies:   Morphine and related  ? ?Social History  ? ?Socioeconomic History  ? Marital status: Married  ?  Spouse name: Not on file  ? Number of children: 2  ? Years of education: COLLEGE  ? Highest education level: Not on file  ?Occupational History  ? Occupation: retired  ?Tobacco Use  ? Smoking status: Former  ?  Packs/day: 1.50  ?  Years: 45.00  ?  Pack years: 67.50  ?  Types: Cigarettes  ?  Quit date: 11/19/2015  ?  Years since quitting: 6.2  ? Smokeless tobacco: Never  ? Tobacco comments:  ?  pt quit smoking!! 11/19/15  ?Vaping Use  ? Vaping Use: Never used  ?Substance and Sexual Activity  ? Alcohol use: No  ?  Alcohol/week: 0.0 standard drinks  ? Drug use: No  ? Sexual activity: Not  on file  ?Other Topics Concern  ? Not on file  ?Social History Narrative  ? Not on file  ? ?Social Determinants of Health  ? ?Financial Resource Strain: Not on file  ?Food Insecurity: Not on file  ?Transportation Needs: Not on file  ?Physical Activity: Not on file  ?Stress: Not on file  ?Social Connections: Not on file  ?  ? ?Family History: ?The patient's family history includes Alcoholism in his sister; Diabetes in his mother; Drug abuse in his sister; Kidney failure in his father; Prostate cancer in his father. There is no history of Stroke. ? ?ROS:   ?Please  see the history of present illness.    ?Review of Systems  ?Constitutional:  Negative for chills and fever.  ?HENT:  Negative for sore throat.   ?Eyes:  Negative for blurred vision.  ?Respiratory:  Positive for cough, shortness of breath and wheezing.   ?Cardiovascular:  Negative for chest pain, palpitations, orthopnea, claudication, leg swelling and PND.  ?Gastrointestinal:  Negative for blood in stool, melena, nausea and vomiting.  ?Genitourinary:  Negative for hematuria.  ?Musculoskeletal:  Negative for falls.  ?Neurological:  Negative for dizziness and loss of consciousness.  ?Psychiatric/Behavioral:  Negative for substance abuse.    ? ?EKGs/Labs/Other Studies Reviewed:   ? ?The following studies were reviewed today: ?CT Chest 12/07/20: ?IMPRESSION: ?Lung-RADS 2, benign appearance or behavior. Continue annual ?screening with low-dose chest CT without contrast in 12 months. ?  ?Stable tree-in-bud nodularity in the upper lobes likely reflect ?chronic atypical mycobacterial infection or post infectious ?inflammatory scarring. ?  ?Aortic Atherosclerosis (ICD10-I70.0) and Emphysema (ICD10-J43.9). ? ?TTE 11/26/2019 ? ? 1. Left ventricular ejection fraction, by estimation, is 55 to 60%. The  ?left ventricle has normal function. The left ventricle has no regional  ?wall motion abnormalities. Left ventricular diastolic parameters are  ?consistent with Grade I diastolic  ?dysfunction (impaired relaxation).  ? 2. Right ventricular systolic function is normal. The right ventricular  ?size is normal. Tricuspid regurgitation signal is inadequate for assessing  ?PA pressure.  ? 3. The mitral valve is grossly normal. No evidence of mitral valve  ?regurgitation. No evidence of mitral stenosis.  ? 4. The aortic valve is tricuspid. Aortic valve regurgitation is mild. No  ?aortic stenosis is present. Aortic regurgitation PHT measures 658 msec.  ? 5. Aortic dilatation noted. There is mild dilatation of the aortic root  ?measuring 41  mm.  ? 6. The inferior vena cava is normal in size with greater than 50%  ?respiratory variability, suggesting right atrial pressure of 3 mmHg. ? ?Carotid ultrasound 06/2020: ?Summary:  ?Right Carotid: Velocities in the right ICA are consistent with a 1-39%  ?stenosis.  ? ?Left Carotid: Velocities in the left ICA are consistent with a 1-39%  ?stenosis.  ? ?Vertebrals: Bilateral vertebral arteries demonstrate antegrade flow.  ? ?Coronary Cath 08/30/17: ? ?The left ventricular systolic function is normal. ?LV end diastolic pressure is normal. ?The left ventricular ejection fraction is 55-65% by visual estimate. ?There is no mitral valve regurgitation. ?Prox RCA lesion is 40% stenosed. ?Prox RCA to Mid RCA lesion is 50% stenosed. ?Mid RCA-1 lesion is 30% stenosed. ?Mid RCA-2 lesion is 50% stenosed. ?Prox Cx lesion is 40% stenosed. ?Ost LAD to Prox LAD lesion is 30% stenosed. ?  ?1. Moderate non-obstructive, calcific stenoses in the RCA ?2. Mild non-obstructive, calcific stenoses in the LAD and Circumflex ?3. Normal LV systolic function ?  ?Recommendations: Medical management of CAD. Tobacco cessation. ? ?EKG:  No new ECG today ? ?Recent Labs: ?04/17/2021: BUN 19; Creatinine, Ser 1.03; Potassium 4.3; Sodium 140  ?Recent Lipid Panel ?   ?Component Value Date/Time  ? CHOL 88 06/03/2020 1400  ? CHOL 79 (L) 11/23/2019 1156  ? TRIG 82 06/03/2020 1400  ? HDL 24 (L) 06/03/2020 1400  ? HDL 25 (L) 11/23/2019 1156  ? CHOLHDL 3.7 06/03/2020 1400  ? VLDL 16 06/03/2020 1400  ? LDLCALC 48 06/03/2020 1400  ? LDLCALC 41 11/23/2019 1156  ? ? ? ?Risk Assessment/Calculations:   ? ?CHA2DS2-VASc Score = 6  ?This indicates a 9.7% annual risk of stroke. ?The patient's score is based upon: ?CHF History: 0 ?HTN History: 1 ?Diabetes History: 1 ?Stroke History: 2 ?Vascular Disease History: 1 ?Age Score: 1 ?Gender Score: 0 ?  ? ?    ? ?Physical Exam:   ? ?VS:  There were no vitals taken for this visit.   ? ?Wt Readings from Last 3 Encounters:   ?12/26/21 212 lb 3.2 oz (96.3 kg)  ?06/30/21 220 lb (99.8 kg)  ?06/21/21 219 lb (99.3 kg)  ?  ? ?GEN:  Well nourished, well developed in no acute distress ?HEENT: Normal ?NECK: No JVD; No carotid bruits ?CARDIAC: Irregular

## 2022-02-08 DIAGNOSIS — E1169 Type 2 diabetes mellitus with other specified complication: Secondary | ICD-10-CM | POA: Diagnosis not present

## 2022-02-08 DIAGNOSIS — J449 Chronic obstructive pulmonary disease, unspecified: Secondary | ICD-10-CM | POA: Diagnosis not present

## 2022-02-08 DIAGNOSIS — I10 Essential (primary) hypertension: Secondary | ICD-10-CM | POA: Diagnosis not present

## 2022-02-08 DIAGNOSIS — I4891 Unspecified atrial fibrillation: Secondary | ICD-10-CM | POA: Diagnosis not present

## 2022-02-09 ENCOUNTER — Encounter: Payer: Self-pay | Admitting: Cardiology

## 2022-02-09 ENCOUNTER — Ambulatory Visit: Payer: Medicare Other | Admitting: Cardiology

## 2022-02-09 VITALS — BP 122/72 | HR 91 | Ht 69.0 in | Wt 207.4 lb

## 2022-02-09 DIAGNOSIS — I2510 Atherosclerotic heart disease of native coronary artery without angina pectoris: Secondary | ICD-10-CM

## 2022-02-09 DIAGNOSIS — J9611 Chronic respiratory failure with hypoxia: Secondary | ICD-10-CM

## 2022-02-09 DIAGNOSIS — I10 Essential (primary) hypertension: Secondary | ICD-10-CM | POA: Diagnosis not present

## 2022-02-09 DIAGNOSIS — I63419 Cerebral infarction due to embolism of unspecified middle cerebral artery: Secondary | ICD-10-CM

## 2022-02-09 DIAGNOSIS — E785 Hyperlipidemia, unspecified: Secondary | ICD-10-CM

## 2022-02-09 DIAGNOSIS — Z87891 Personal history of nicotine dependence: Secondary | ICD-10-CM

## 2022-02-09 DIAGNOSIS — I6523 Occlusion and stenosis of bilateral carotid arteries: Secondary | ICD-10-CM

## 2022-02-09 DIAGNOSIS — I480 Paroxysmal atrial fibrillation: Secondary | ICD-10-CM | POA: Diagnosis not present

## 2022-02-09 NOTE — Patient Instructions (Signed)
Medication Instructions:  ? ?Your physician recommends that you continue on your current medications as directed. Please refer to the Current Medication list given to you today. ? ?*If you need a refill on your cardiac medications before your next appointment, please call your pharmacy* ? ? ?Testing/Procedures: ? ?Your physician has requested that you have a carotid duplex. This test is an ultrasound of the carotid arteries in your neck. It looks at blood flow through these arteries that supply the brain with blood. Allow one hour for this exam. There are no restrictions or special instructions. ? ? ?Follow-Up: ?At [facility], you and your health needs are our priority.  As part of our continuing mission to provide you with exceptional heart care, we have created designated Provider Care Teams.  These Care Teams include your primary Cardiologist (physician) and Advanced Practice Providers (APPs -  Physician Assistants and Nurse Practitioners) who all work together to provide you with the care you need, when you need it. ? ?We recommend signing up for the patient portal called "MyChart".  Sign up information is provided on this After Visit Summary.  MyChart is used to connect with patients for Virtual Visits (Telemedicine).  Patients are able to view lab/test results, encounter notes, upcoming appointments, etc.  Non-urgent messages can be sent to your provider as well.   ?To learn more about what you can do with MyChart, go to [email or web address].   ? ?Your next appointment:   ?6 month(s) ? ?The format for your next appointment:   ?In Person ? ?Provider:   ?Freada Bergeron, MD { ? ? ? ?Important Information About Sugar ? ? ? ? ? ? ?

## 2022-02-09 NOTE — Progress Notes (Signed)
?Cardiology Office Note:   ? ?Date:  02/09/2022  ? ?ID:  Nicholas Camacho, DOB 07/08/1951, MRN [id number] ? ?PCP:  [name], [name], MD ?  ?[facility] Providers ?Cardiologist:  Meriam SpragueHeather E Wofford Stratton, MD { ? ? ?Referring MD: [name], [name], MD  ? ? ?History of Present Illness:   ? ?Nicholas Camacho is a 71 y.o. male with a hx of pAfib on apixaban, HTN, HLD, COPD, OSA, small AAA, and iliac aneurysm who was previously followed by [name] who now presents to clinic for follow-up. ? ?Per review of the record, the patient underwent coronary CTA 08/20/17 for mild fatigue on exertion showed calcium score of 3244 which was the 99th percentile for age and sex.  Moderate stenosis in the LAD and RCA with suspicion for severe stenosis in the mid RCA.  Cardiac catheterization 08/30/17 showed moderate nonobstructive calcific stenosis in the RCA with mild nonobstructive calcific stenosis in the LAD and circumflex normal LV function.  Medical management was recommended as well as smoking cessation.   ? ?Patient suffered a stroke in 06/2020 due to missed doses of eliquis. His head CT did not show any changes other than chronic microvascular changes.  Brain MRI showed Cluster of small acute infarctions at the left frontoparietal junction region, consistent with left MCA branch vessel insult. No large confluent infarction. No swelling or hemorrhage. 2. Mild to moderate chronic small-vessel ischemic changes of the cerebral hemispheric white matter. ? ?Was last seen 04/21/21 where he was doing well. Recovered from his CVA. No CV symptoms. ? ?Today, the patient states that he is suffering from sinus congestion and seasonal allergies. Otherwise he is feeling pretty good.  ? ?Periodically he checks his blood pressure at home which is usually around 132-137/78. When he uses his pulse ox he notices heart rates as high as the 90s, which gradually decreases to 68 bpm if he rests.  ? ?He is retired and not very active. He may take naps  during the day. Of note, minimal activity will cause fatigue and shortness of breath. He attributes this to deconditioning and laziness since he retired 5 years ago. Previously he would have been able to climb the stairs without symptoms. Today he is not able to do activities such as mowing the lawn. If he does over exert, he will need to stop and rest for 10 minutes before he feels well enough to continue. ? ?He uses an oxygen concentrator overnight at home. He uses 2 L every night. Typically he is good with self medication of his inhalers. This morning he used them for wheezing, which has been improving. ? ?About 20 days ago he had a COVID infection. He states that his oxygen saturation went to high 80s at that time. Did not require hospitalization. Was vaccinated and boosted. Now fully recovered.  ? ?He denies any palpitations, chest pain, or peripheral edema. No lightheadedness, headaches, syncope, orthopnea, or PND. ? ?Past Medical History:  ?Diagnosis Date  ? COPD (chronic obstructive pulmonary disease) (HCC)   ? Disturbance of skin sensation 06/24/2013  ? Dysrhythmia   ? Elevated cholesterol   ? History of echocardiogram   ? Echo 4/17: EF 60-65%, no RWMA, Gr 2 DD  ? Hypertension   ? Sleep disturbance, unspecified 06/24/2013  ? ? ?Past Surgical History:  ?Procedure Laterality Date  ? ESOPHAGOGASTRODUODENOSCOPY N/A 01/08/2016  ? Procedure: ESOPHAGOGASTRODUODENOSCOPY (EGD);  Surgeon: [name], MD;  [facility];  Service: Endoscopy;  Laterality: N/A;  ? fractured pelvis    ?  INGUINAL HERNIA REPAIR Bilateral   ? LEFT HEART CATH AND CORONARY ANGIOGRAPHY N/A 08/30/2017  ? Procedure: LEFT HEART CATH AND CORONARY ANGIOGRAPHY;  Surgeon: Kathleene Hazel, MD;  Location: [facility];  Service: Cardiovascular;  Laterality: N/A;  ? reconstruct left heel  2002  ? TONSILLECTOMY    ? ? ?Current Medications: ?Current Meds  ?Medication Sig  ? apixaban (ELIQUIS) 5 MG TABS tablet Take 1 tablet (5 mg  total) by mouth 2 (two) times daily.  ? atorvastatin (LIPITOR) 80 MG tablet Take 1 tablet (80 mg total) by mouth daily.  ? Benralizumab (FASENRA PEN) 30 MG/ML SOAJ Inject 1 mL (30 mg total) into the skin every 8 (eight) weeks.  ? Cyanocobalamin 1000 MCG CAPS Take 1,000 mcg by mouth daily.  ? diltiazem (CARDIZEM CD) 180 MG 24 hr capsule Take 1 capsule (180 mg total) by mouth daily.  ? fluticasone (FLONASE) 50 MCG/ACT nasal spray SHAKE LIQUID AND USE 2 SPRAYS IN EACH NOSTRIL TWICE DAILY  ? ipratropium (ATROVENT) 0.03 % nasal spray Place 2 sprays into both nostrils every 12 (twelve) hours.  ? ipratropium-albuterol (DUONEB) 0.5-2.5 (3) MG/3ML SOLN USE 1 VIAL VIA NEBULIZER(3ML) THREE TIMES DAILY  ? losartan (COZAAR) 50 MG tablet TAKE 1 TABLET(50 MG) BY MOUTH DAILY  ? montelukast (SINGULAIR) 10 MG tablet TAKE 1 TABLET(10 MG) BY MOUTH AT BEDTIME  ? Respiratory Therapy Supplies (FLUTTER) DEVI 1 Device by Does not apply route in the morning and at bedtime.  ? TRELEGY ELLIPTA 100-62.5-25 MCG/ACT AEPB INHALE 1 PUFF INTO THE LUNGS DAILY  ? VENTOLIN HFA 108 (90 Base) MCG/ACT inhaler INHALE 2 PUFFS BY MOUTH EVERY 6 HOURS AS NEEDED FOR WHEEZING OR SHORTNESS OF BREATH  ?  ? ?Allergies:   Morphine and related  ? ?Social History  ? ?Socioeconomic History  ? Marital status: Married  ?  Spouse name: Not on file  ? Number of children: 2  ? Years of education: COLLEGE  ? Highest education level: Not on file  ?Occupational History  ? Occupation: retired  ?Tobacco Use  ? Smoking status: Former  ?  Packs/day: 1.50  ?  Years: 45.00  ?  Pack years: 67.50  ?  Types: Cigarettes  ?  Quit date: 11/19/2015  ?  Years since quitting: 6.2  ? Smokeless tobacco: Never  ? Tobacco comments:  ?  pt quit smoking!! 11/19/15  ?Vaping Use  ? Vaping Use: Never used  ?Substance and Sexual Activity  ? Alcohol use: No  ?  Alcohol/week: 0.0 standard drinks  ? Drug use: No  ? Sexual activity: Not on file  ?Other Topics Concern  ? Not on file  ?Social History  Narrative  ? Not on file  ? ?Social Determinants of Health  ? ?Financial Resource Strain: Not on file  ?Food Insecurity: Not on file  ?Transportation Needs: Not on file  ?Physical Activity: Not on file  ?Stress: Not on file  ?Social Connections: Not on file  ?  ? ?Family History: ?The patient's family history includes Alcoholism in his sister; Diabetes in his mother; Drug abuse in his sister; Kidney failure in his father; Prostate cancer in his father. There is no history of Stroke. ? ?ROS:   ?Please see the history of present illness.    ?Review of Systems  ?Constitutional:  Positive for malaise/fatigue. Negative for chills and fever.  ?HENT:  Positive for congestion. Negative for sore throat.   ?Eyes:  Negative for blurred vision.  ?Respiratory:  Positive  for cough, shortness of breath and wheezing.   ?Cardiovascular:  Negative for chest pain, palpitations, orthopnea, claudication, leg swelling and PND.  ?Gastrointestinal:  Negative for blood in stool, melena, nausea and vomiting.  ?Genitourinary:  Negative for hematuria.  ?Musculoskeletal:  Negative for falls.  ?Neurological:  Negative for dizziness and loss of consciousness.  ?Psychiatric/Behavioral:  Negative for substance abuse.    ? ?EKGs/Labs/Other Studies Reviewed:   ? ?The following studies were reviewed today: ? ?CTA Chest/Aorta 04/21/2021: ?FINDINGS: ?Cardiovascular: Scattered atherosclerotic change. No significant ?aneurysm or dissection. Maximal ascending thoracic aortic diameter ?3.5 cm. Patent 3 vessel arch anatomy. ?  ?Pulmonary arteries are normal in caliber and patent. Negative for ?acute pulmonary embolus. ?  ?Normal heart size. No pericardial effusion. Native coronary ?atherosclerosis noted. Central venous structures are patent. ?  ?Mediastinum/Nodes: No enlarged mediastinal, hilar, or axillary lymph ?nodes. Thyroid gland, trachea, and esophagus demonstrate no ?significant findings. ?  ?Lungs/Pleura: Mild upper lobe predominant centrilobular  emphysema ?pattern. No acute airspace process, collapse or consolidation. ?Negative for edema, or interstitial disease. ?  ?No pleural abnormality, effusion or pneumothorax. Trachea and ?central airways are patent. ?  ?Up

## 2022-02-19 ENCOUNTER — Other Ambulatory Visit: Payer: Self-pay | Admitting: *Deleted

## 2022-02-19 DIAGNOSIS — Z87891 Personal history of nicotine dependence: Secondary | ICD-10-CM

## 2022-02-19 DIAGNOSIS — Z122 Encounter for screening for malignant neoplasm of respiratory organs: Secondary | ICD-10-CM

## 2022-03-02 ENCOUNTER — Ambulatory Visit (HOSPITAL_COMMUNITY)
Admission: RE | Admit: 2022-03-02 | Discharge: 2022-03-02 | Disposition: A | Discharge status: Home / Self Care | Payer: Medicare Other | Source: Ambulatory Visit | Attending: Cardiology | Admitting: Cardiology

## 2022-03-02 DIAGNOSIS — I6523 Occlusion and stenosis of bilateral carotid arteries: Secondary | ICD-10-CM | POA: Insufficient documentation

## 2022-03-05 ENCOUNTER — Encounter: Payer: Self-pay | Admitting: Cardiology

## 2022-03-05 NOTE — Telephone Encounter (Signed)
Error

## 2022-03-16 ENCOUNTER — Telehealth: Payer: Self-pay | Admitting: Pulmonary Disease

## 2022-03-16 MED ORDER — AZITHROMYCIN 250 MG PO TABS: ORAL_TABLET | ORAL | 0 refills | Status: DC

## 2022-03-16 NOTE — Telephone Encounter (Signed)
Called and spoke with patient. He stated that he has not been feeling well for the past few days. He has been coughing up thick green phlegm. Increased SOB and wheezing. He had a low grade fever 2 days ago but it has since gone away. Denied being around anyone who has been sick recently. Confirmed that he is still using his Trelegy daily.   He is requesting an abx. Pharmacy is [facility] on [address].   Dr. Vassie Loll, can you please advise since Dr. Francine Graven is off today?

## 2022-03-16 NOTE — Telephone Encounter (Signed)
Called and spoke with patient. He verbalized understanding. RX has been sent in.   Nothing further needed at time of call.  

## 2022-03-19 ENCOUNTER — Telehealth: Payer: Self-pay | Admitting: Pulmonary Disease

## 2022-03-20 ENCOUNTER — Other Ambulatory Visit: Payer: Self-pay | Admitting: Pulmonary Disease

## 2022-03-21 ENCOUNTER — Telehealth: Payer: Self-pay

## 2022-03-21 NOTE — Telephone Encounter (Signed)
Received notification from [facility] regarding a prior authorization for [facility]. Authorization has been APPROVED from 03/19/2022 to 03/20/2023.    Authorization # [id number]

## 2022-03-22 NOTE — Telephone Encounter (Signed)
PA approval for Fasenra.

## 2022-04-10 DIAGNOSIS — J449 Chronic obstructive pulmonary disease, unspecified: Secondary | ICD-10-CM | POA: Diagnosis not present

## 2022-04-10 DIAGNOSIS — I10 Essential (primary) hypertension: Secondary | ICD-10-CM | POA: Diagnosis not present

## 2022-04-10 DIAGNOSIS — I4891 Unspecified atrial fibrillation: Secondary | ICD-10-CM | POA: Diagnosis not present

## 2022-04-10 DIAGNOSIS — E1169 Type 2 diabetes mellitus with other specified complication: Secondary | ICD-10-CM | POA: Diagnosis not present

## 2022-04-20 ENCOUNTER — Other Ambulatory Visit: Payer: Self-pay

## 2022-04-20 ENCOUNTER — Other Ambulatory Visit: Payer: Self-pay | Admitting: *Deleted

## 2022-04-20 DIAGNOSIS — I480 Paroxysmal atrial fibrillation: Secondary | ICD-10-CM

## 2022-04-20 MED ORDER — ATORVASTATIN CALCIUM 80 MG PO TABS: 80.0000 mg | ORAL_TABLET | Freq: Every day | ORAL | 3 refills | Status: DC

## 2022-04-20 MED ORDER — APIXABAN 5 MG PO TABS: 5.0000 mg | ORAL_TABLET | Freq: Two times a day (BID) | ORAL | 6 refills | Status: DC

## 2022-04-20 NOTE — Telephone Encounter (Signed)
Eliquis 5mg  refill request received. Patient is 71 years old, weight-94.1kg, Crea-0.94 on 10/24/2021 via [facility] from [place], [place], and last seen by Dr. [place] on 02/09/2022. Dose is appropriate based on dosing criteria. Will send in refill to requested pharmacy.

## 2022-04-23 ENCOUNTER — Inpatient Hospital Stay: Admission: RE | Admit: 2022-04-23 | Payer: Medicare Other | Source: Ambulatory Visit

## 2022-04-23 ENCOUNTER — Ambulatory Visit
Admission: RE | Admit: 2022-04-23 | Discharge: 2022-04-23 | Disposition: A | Discharge status: Home / Self Care | Payer: Medicare Other | Source: Ambulatory Visit | Attending: Acute Care | Admitting: Acute Care

## 2022-04-23 DIAGNOSIS — Z87891 Personal history of nicotine dependence: Secondary | ICD-10-CM

## 2022-04-23 DIAGNOSIS — Z122 Encounter for screening for malignant neoplasm of respiratory organs: Secondary | ICD-10-CM

## 2022-04-26 ENCOUNTER — Telehealth: Payer: Self-pay | Admitting: Acute Care

## 2022-04-26 DIAGNOSIS — R911 Solitary pulmonary nodule: Secondary | ICD-10-CM

## 2022-04-26 DIAGNOSIS — Z87891 Personal history of nicotine dependence: Secondary | ICD-10-CM

## 2022-04-26 NOTE — Telephone Encounter (Signed)
I have called the patient with the results of his Low dose screening CT of the chest.  I explained that his scan was read as a lung RADS 3, most likely benign but needs a 11-month follow-up.  There is a new 4.5 mm left upper lobe nodule that will require a 35-month follow-up low-dose CT to ensure stability. Denise please fax results to PCP who patient does see on 8 /4, and place order for 21-month low-dose CT follow-up. Thanks so much.  Both patient and his wife are in agreement with the plan and verbalized understanding. Dr. Francine Graven, I am including you just so you are aware that we will do a follow-up in 6 months to reevaluate the 4.5 mm new left upper lobe nodule. Please let me know if yu have any questions or concerns. I did let the patient know there was notation of CAD, he is on  statin follows with cards.  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 Felt to be slightly progressive in the right lower lobe. Additionally , there was chronic calcific pancreatitis and Cholelithiasis noted. He is asymptomatic. He has follow up with his PCP 8/4 and is going to discuss this with him then.

## 2022-04-26 NOTE — Telephone Encounter (Signed)
CT results faxed to PCP with follow up plans included. Order placed for 6 mth nodule f/u low dose CT.  

## 2022-05-03 ENCOUNTER — Ambulatory Visit: Payer: Medicare Other | Admitting: Pulmonary Disease

## 2022-05-03 DIAGNOSIS — I4891 Unspecified atrial fibrillation: Secondary | ICD-10-CM | POA: Diagnosis not present

## 2022-05-03 DIAGNOSIS — Z0000 Encounter for general adult medical examination without abnormal findings: Secondary | ICD-10-CM | POA: Diagnosis not present

## 2022-05-03 DIAGNOSIS — I10 Essential (primary) hypertension: Secondary | ICD-10-CM | POA: Diagnosis not present

## 2022-05-03 DIAGNOSIS — E538 Deficiency of other specified B group vitamins: Secondary | ICD-10-CM | POA: Diagnosis not present

## 2022-05-03 DIAGNOSIS — E785 Hyperlipidemia, unspecified: Secondary | ICD-10-CM | POA: Diagnosis not present

## 2022-05-04 ENCOUNTER — Encounter: Payer: Self-pay | Admitting: Pulmonary Disease

## 2022-05-04 ENCOUNTER — Ambulatory Visit (INDEPENDENT_AMBULATORY_CARE_PROVIDER_SITE_OTHER): Payer: Medicare Other | Admitting: Pulmonary Disease

## 2022-05-04 ENCOUNTER — Ambulatory Visit: Payer: Medicare Other | Admitting: Pulmonary Disease

## 2022-05-04 VITALS — BP 118/70 | HR 64 | Ht 69.0 in | Wt 209.2 lb

## 2022-05-04 DIAGNOSIS — J309 Allergic rhinitis, unspecified: Secondary | ICD-10-CM

## 2022-05-04 DIAGNOSIS — J449 Chronic obstructive pulmonary disease, unspecified: Secondary | ICD-10-CM | POA: Diagnosis not present

## 2022-05-04 DIAGNOSIS — G4734 Idiopathic sleep related nonobstructive alveolar hypoventilation: Secondary | ICD-10-CM

## 2022-05-04 DIAGNOSIS — R911 Solitary pulmonary nodule: Secondary | ICD-10-CM | POA: Diagnosis not present

## 2022-05-04 LAB — PULMONARY FUNCTION TEST
FEF 25-75 Pre: 0.76 L/sec
FEF2575-%Pred-Pre: 33 %
FEV1-%Pred-Pre: 52 %
FEV1-Pre: 1.58 L
FEV1FVC-%Pred-Pre: 69 %
FEV6-%Pred-Pre: 77 %
FEV6-Pre: 2.99 L
FEV6FVC-%Pred-Pre: 103 %
FVC-%Pred-Pre: 75 %
FVC-Pre: 3.09 L
Pre FEV1/FVC ratio: 51 %
Pre FEV6/FVC Ratio: 97 %

## 2022-05-04 MED ORDER — IPRATROPIUM-ALBUTEROL 0.5-2.5 (3) MG/3ML IN SOLN: RESPIRATORY_TRACT | 6 refills | Status: DC

## 2022-05-04 MED ORDER — IPRATROPIUM BROMIDE 0.03 % NA SOLN: 2.0000 | Freq: Two times a day (BID) | NASAL | 12 refills | Status: AC

## 2022-05-04 NOTE — Progress Notes (Signed)
Spirometry done today. 

## 2022-05-04 NOTE — Patient Instructions (Addendum)
Continue Trelegy Ellipta Inhaler 1 puff daily   Continue to use duoneb nebulizer treatment or albuterol inhaler as needed for cough, wheezing, or dyspnea.    Continue Fasenra injections every 8 weeks.    Use flonase nasal spray 2 sprays per nostril each day.   Use ipratropium nasal spray as needed for sinus congestion.  Follow up in 6 months

## 2022-05-04 NOTE — Progress Notes (Signed)
Synopsis: Referred in 2014 for COPD by Farris Has, MD.  Formerly a patient of Dr. Kendrick Fries and Dr. Chestine Spore.  Subjective:   PATIENT ID: Nicholas Camacho GENDER: male DOB: [birth year], MRN: [id number]  HPI  Chief Complaint  Patient presents with   Follow-up    F/U after PFT. States breathing has been stable since last visit.    Nicholas Camacho is a 71 year old male, former smoker with asthma-COPD overlap syndrome and nocturnal hypoxemia who returns to clinic for follow up.  [facility] CT Chest last month showed LLL with similar tree in bud nodularity and new LUL nodule measuring 4.42mm with recommended 6 month follow up.  He is doing well over all. Continues on trelegy daily and fasenra injections. He is requesting refill on duonebs.   PFTs are stable since 2021 on repeat today.  OV 12/26/21 He reports doing well since starting fasenra. He has only required prednisone once since starting fasenra. He denies any acute issues at this time. He continues on trelegy ellipta daily and singulair.   OV 06/30/21 He started Fasenra injections in July and has had 3 shots so far.  He has not required prednisone or antibiotics since starting Fasenra.  He reports that his wife has noted an improvement in his cough and breathing.  He does not report significant changes that he has noticed at this time.  He does continue to have a cough with green sputum.  He continues to use Trelegy Ellipta 1 puff daily.  He is also using DuoNeb nebulizer treatments along with flutter valve for mucus clearance.  He continues to take montelukast daily as well.  His sinus congestion and drainage is maintained using Zyrtec, fluticasone and ipratropium nasal sprays.  He has received his influenza vaccine as well as COVID booster recently.  Past Medical History:  Diagnosis Date   COPD (chronic obstructive pulmonary disease) (HCC)    Disturbance of skin sensation 06/24/2013   Dysrhythmia    Elevated cholesterol    History  of echocardiogram    Echo 4/17: EF 60-65%, no RWMA, Gr 2 DD   Hypertension    Sleep disturbance, unspecified 06/24/2013     Family History  Problem Relation Age of Onset   Diabetes Mother    Prostate cancer Father    Kidney failure Father    Alcoholism Sister        history of   Drug abuse Sister        history of   Stroke Neg Hx      Social History   Socioeconomic History   Marital status: Married    Spouse name: Not on file   Number of children: 2   Years of education: COLLEGE   Highest education level: Not on file  Occupational History   Occupation: retired  Tobacco Use   Smoking status: Former    Packs/day: 1.50    Years: 45.00    Total pack years: 67.50    Types: Cigarettes    Quit date: 11/19/2015    Years since quitting: 6.4   Smokeless tobacco: Never   Tobacco comments:    pt quit smoking!! 11/19/15  Vaping Use   Vaping Use: Never used  Substance and Sexual Activity   Alcohol use: No    Alcohol/week: 0.0 standard drinks of alcohol   Drug use: No   Sexual activity: Not on file  Other Topics Concern   Not on file  Social History Narrative   Not on file  Social Determinants of Health   Financial Resource Strain: Not on file  Food Insecurity: Not on file  Transportation Needs: Not on file  Physical Activity: Not on file  Stress: Not on file  Social Connections: Not on file  Intimate Partner Violence: Not on file     Allergies  Allergen Reactions   Morphine And Related     HALLUCINATIONS     Outpatient Medications Prior to Visit  Medication Sig Dispense Refill   apixaban (ELIQUIS) 5 MG TABS tablet Take 1 tablet (5 mg total) by mouth 2 (two) times daily. 60 tablet 6   atorvastatin (LIPITOR) 80 MG tablet Take 1 tablet (80 mg total) by mouth daily. 90 tablet 3   Benralizumab (FASENRA PEN) 30 MG/ML SOAJ Inject 1 mL (30 mg total) into the skin every 8 (eight) weeks. 1 mL 2   Cyanocobalamin 1000 MCG CAPS Take 1,000 mcg by mouth daily.     diltiazem  (CARDIZEM CD) 180 MG 24 hr capsule Take 1 capsule (180 mg total) by mouth daily. 90 capsule 2   fluticasone (FLONASE) 50 MCG/ACT nasal spray SHAKE LIQUID AND USE 2 SPRAYS IN EACH NOSTRIL TWICE DAILY 16 g 3   losartan (COZAAR) 50 MG tablet TAKE 1 TABLET(50 MG) BY MOUTH DAILY 90 tablet 1   montelukast (SINGULAIR) 10 MG tablet TAKE 1 TABLET(10 MG) BY MOUTH AT BEDTIME 30 tablet 11   Respiratory Therapy Supplies (FLUTTER) DEVI 1 Device by Does not apply route in the morning and at bedtime. 1 each 0   TRELEGY ELLIPTA 100-62.5-25 MCG/ACT AEPB INHALE 1 PUFF INTO THE LUNGS DAILY 60 each 4   VENTOLIN HFA 108 (90 Base) MCG/ACT inhaler INHALE 2 PUFFS BY MOUTH EVERY 6 HOURS AS NEEDED FOR WHEEZING OR SHORTNESS OF BREATH 18 g 12   azithromycin (ZITHROMAX) 250 MG tablet Take 2 tablets on first day, then 1 tablet daily until finished. 6 tablet 0   ipratropium (ATROVENT) 0.03 % nasal spray Place 2 sprays into both nostrils every 12 (twelve) hours. 30 mL 12   ipratropium-albuterol (DUONEB) 0.5-2.5 (3) MG/3ML SOLN USE 1 VIAL VIA NEBULIZER(3ML) THREE TIMES DAILY 360 mL 11   No facility-administered medications prior to visit.   Review of Systems  Constitutional:  Negative for chills, fever, malaise/fatigue and weight loss.  HENT:  Negative for congestion, sinus pain and sore throat.   Eyes: Negative.   Respiratory:  Negative for cough, hemoptysis, sputum production, shortness of breath and wheezing.   Cardiovascular:  Negative for chest pain, palpitations, orthopnea, claudication and leg swelling.  Gastrointestinal:  Negative for abdominal pain, heartburn, nausea and vomiting.  Genitourinary: Negative.   Musculoskeletal:  Negative for joint pain and myalgias.  Skin:  Negative for rash.  Neurological:  Negative for weakness.  Endo/Heme/Allergies: Negative.   Psychiatric/Behavioral: Negative.     Objective:   Vitals:   05/04/22 1054  BP: 118/70  Pulse: 64  SpO2: 96%  Weight: 209 lb 3.2 oz (94.9 kg)   Height: 5\' 9"  (1.753 m)   Physical Exam Constitutional:      General: He is not in acute distress. HENT:     Head: Normocephalic and atraumatic.  Eyes:     Conjunctiva/sclera: Conjunctivae normal.  Cardiovascular:     Rate and Rhythm: Normal rate and regular rhythm.     Pulses: Normal pulses.     Heart sounds: Normal heart sounds. No murmur heard. Pulmonary:     Effort: Pulmonary effort is normal.     Breath  sounds: Rhonchi (mild on right lung) present. No wheezing or rales.  Musculoskeletal:     Right lower leg: No edema.     Left lower leg: No edema.  Skin:    General: Skin is warm and dry.  Neurological:     General: No focal deficit present.     Mental Status: He is alert.    CBC    Component Value Date/Time   WBC 7.7 01/24/2021 1243   RBC 4.94 01/24/2021 1243   HGB 13.7 01/24/2021 1243   HGB 14.7 11/23/2019 1156   HCT 41.9 01/24/2021 1243   HCT 43.8 11/23/2019 1156   PLT 251.0 01/24/2021 1243   PLT 373 11/23/2019 1156   MCV 84.9 01/24/2021 1243   MCV 83 11/23/2019 1156   MCH 27.2 06/01/2020 1807   MCHC 32.6 01/24/2021 1243   RDW 16.0 (H) 01/24/2021 1243   RDW 13.2 11/23/2019 1156   LYMPHSABS 1.3 01/24/2021 1243   LYMPHSABS 1.7 11/23/2019 1156   MONOABS 0.7 01/24/2021 1243   EOSABS 0.2 01/24/2021 1243   EOSABS 0.3 11/23/2019 1156   BASOSABS 0.0 01/24/2021 1243   BASOSABS 0.0 11/23/2019 1156      Latest Ref Rng & Units 04/17/2021   12:01 PM 06/01/2020    6:07 PM 11/23/2019   11:56 AM  BMP  Glucose 65 - 99 mg/dL 161  096  045   BUN 8 - 27 mg/dL 19  12  13    Creatinine 0.76 - 1.27 mg/dL  4.09  8.11   BUN/Creat Ratio 10 - 24 18   12    Sodium 134 - 144 mmol/L 140  139  140   Potassium 3.5 - 5.2 mmol/L 4.3  4.2  4.3   Chloride 96 - 106 mmol/L 101  104  104   CO2 20 - 29 mmol/L 23  26  21    Calcium 8.6 - 10.2 mg/dL 9.7  9.5  9.3    Chest imaging: LCS CT Chest 04/23/22 Mediastinum/Nodes: No mediastinal or definite hilar adenopathy, given limitations  of unenhanced CT.   Lungs/Pleura: No pleural fluid. Mild centrilobular and paraseptal emphysema. Lower lobe predominant bronchial wall thickening. Again identified is relatively diffuse "tree-in-bud" micronodularity. This is primarily similar, with mild increase in the right lower lobe.   A left upper lobe larger pulmonary nodule of volume derived equivalent diameter 4.5 mm on image 53 is new.  CT Chest 12/07/20 Lung-RADS 2, benign appearance or behavior. Continue annual screening with low-dose chest CT without contrast in 12 months.   Stable tree-in-bud nodularity in the upper lobes likely reflect chronic atypical mycobacterial infection or post infectious inflammatory scarring.   Aortic Atherosclerosis (ICD10-I70.0) and Emphysema (ICD10-J43.9).  PFT:    Latest Ref Rng & Units 05/04/2022   10:30 AM 11/18/2019    2:52 PM 10/23/2013   11:50 AM  PFT Results  FVC-Pre L 3.09  P 3.12  4.11   FVC-Predicted Pre % 75  P 74  93   FVC-Post L  2.85  4.12   FVC-Predicted Post %  67  94   Pre FEV1/FVC % % 51  P 53  58   Post FEV1/FCV % %  54  58   FEV1-Pre L 1.58  P 1.64  2.38   FEV1-Predicted Pre % 52  P 52  72   FEV1-Post L  1.54  2.41   DLCO uncorrected ml/min/mmHg  19.63  21.40   DLCO UNC% %  79  72   DLVA  Predicted %  99  72   TLC L  7.38  8.64   TLC % Predicted %  111  130   RV % Predicted %  187  206     P Preliminary result    2021: Moderate obstruction without bronchodilator reversibility. Significant air trapping without hyperinflation.  Mildly reduced diffusion.   2015: Moderate obstruction with hyperinflation, no significant bronchodilator reversibility.  Mild diffusion impairment.  Echo 06/02/20: 1. Left ventricular ejection fraction, by estimation, is 55 to 60%. The  left ventricle has normal function. The left ventricle has no regional  wall motion abnormalities. Left ventricular diastolic parameters are  consistent with Grade I diastolic  dysfunction (impaired  relaxation).   2. Right ventricular systolic function is normal. The right ventricular  size is normal.   3. The mitral valve is normal in structure. Trivial mitral valve  regurgitation. No evidence of mitral stenosis.   4. The aortic valve was not well visualized. Aortic valve regurgitation  is mild. Mild aortic valve sclerosis is present, with no evidence of  aortic valve stenosis.   5. The inferior vena cava is normal in size with greater than 50%  respiratory variability, suggesting right atrial pressure of 3 mmHg.   Heart Catheterization 08/30/2017: The left ventricular systolic function is normal. LV end diastolic pressure is normal. The left ventricular ejection fraction is 55-65% by visual estimate. There is no mitral valve regurgitation. Prox RCA lesion is 40% stenosed. Prox RCA to Mid RCA lesion is 50% stenosed. Mid RCA-1 lesion is 30% stenosed. Mid RCA-2 lesion is 50% stenosed. Prox Cx lesion is 40% stenosed. Ost LAD to Prox LAD lesion is 30% stenosed.   1. Moderate non-obstructive, calcific stenoses in the RCA 2. Mild non-obstructive, calcific stenoses in the LAD and Circumflex 3. Normal LV systolic function   Recommendations: Medical management of CAD. Tobacco cessation.   Assessment & Plan:   Asthma-COPD overlap syndrome (HCC)  Nocturnal hypoxemia  Allergic rhinitis, unspecified seasonality, unspecified trigger - Plan: ipratropium (ATROVENT) 0.03 % nasal spray  Lung nodule  Discussion: Nicholas Camacho is a 71 year old male, former smoker with asthma-COPD overlap syndrome and nocturnal hypoxemia who returns to clinic for follow up.   Continue with Fasenra injections and Trelegy Ellipta 1 puff daily. His spirometry is stable on repeat today.  He is being followed with our lung cancer screening program, he has new LUL nodule that will be monitored in 6 months with repeat scan.  He is to continue Flonase nasal spray 2 sprays per nostril each day for nasal  congestion and he can use ipratropium nasal spray as needed.  He is to follow up in 6 months.  Melody Comas, MD [facility] Office: [phone]    Current Outpatient Medications:    apixaban (ELIQUIS) 5 MG TABS tablet, Take 1 tablet (5 mg total) by mouth 2 (two) times daily., Disp: 60 tablet, Rfl: 6   atorvastatin (LIPITOR) 80 MG tablet, Take 1 tablet (80 mg total) by mouth daily., Disp: 90 tablet, Rfl: 3   Benralizumab (FASENRA PEN) 30 MG/ML SOAJ, Inject 1 mL (30 mg total) into the skin every 8 (eight) weeks., Disp: 1 mL, Rfl: 2   Cyanocobalamin 1000 MCG CAPS, Take 1,000 mcg by mouth daily., Disp: , Rfl:    diltiazem (CARDIZEM CD) 180 MG 24 hr capsule, Take 1 capsule (180 mg total) by mouth daily., Disp: 90 capsule, Rfl: 2   fluticasone (FLONASE) 50 MCG/ACT nasal spray, SHAKE  LIQUID AND USE 2 SPRAYS IN EACH NOSTRIL TWICE DAILY, Disp: 16 g, Rfl: 3   losartan (COZAAR) 50 MG tablet, TAKE 1 TABLET(50 MG) BY MOUTH DAILY, Disp: 90 tablet, Rfl: 1   montelukast (SINGULAIR) 10 MG tablet, TAKE 1 TABLET(10 MG) BY MOUTH AT BEDTIME, Disp: 30 tablet, Rfl: 11   Respiratory Therapy Supplies (FLUTTER) DEVI, 1 Device by Does not apply route in the morning and at bedtime., Disp: 1 each, Rfl: 0   TRELEGY ELLIPTA 100-62.5-25 MCG/ACT AEPB, INHALE 1 PUFF INTO THE LUNGS DAILY, Disp: 60 each, Rfl: 4   VENTOLIN HFA 108 (90 Base) MCG/ACT inhaler, INHALE 2 PUFFS BY MOUTH EVERY 6 HOURS AS NEEDED FOR WHEEZING OR SHORTNESS OF BREATH, Disp: 18 g, Rfl: 12   ipratropium (ATROVENT) 0.03 % nasal spray, Place 2 sprays into both nostrils every 12 (twelve) hours., Disp: 30 mL, Rfl: 12   ipratropium-albuterol (DUONEB) 0.5-2.5 (3) MG/3ML SOLN, USE 1 VIAL VIA NEBULIZER(3ML) THREE TIMES DAILY, Disp: 360 mL, Rfl: 6

## 2022-05-21 ENCOUNTER — Other Ambulatory Visit: Payer: Self-pay

## 2022-05-21 MED ORDER — LOSARTAN POTASSIUM 50 MG PO TABS: ORAL_TABLET | ORAL | 2 refills | Status: DC

## 2022-06-11 ENCOUNTER — Other Ambulatory Visit: Payer: Self-pay

## 2022-06-11 DIAGNOSIS — I480 Paroxysmal atrial fibrillation: Secondary | ICD-10-CM

## 2022-06-11 DIAGNOSIS — I10 Essential (primary) hypertension: Secondary | ICD-10-CM

## 2022-06-11 MED ORDER — DILTIAZEM HCL ER COATED BEADS 180 MG PO CP24: 180.0000 mg | ORAL_CAPSULE | Freq: Every day | ORAL | 2 refills | Status: DC

## 2022-07-02 DIAGNOSIS — E039 Hypothyroidism, unspecified: Secondary | ICD-10-CM | POA: Diagnosis not present

## 2022-07-11 ENCOUNTER — Encounter: Payer: Self-pay | Admitting: Pulmonary Disease

## 2022-07-11 DIAGNOSIS — J4489 Other specified chronic obstructive pulmonary disease: Secondary | ICD-10-CM

## 2022-07-11 MED ORDER — FASENRA PEN 30 MG/ML ~~LOC~~ SOAJ: 30.0000 mg | SUBCUTANEOUS | 2 refills | Status: DC

## 2022-08-08 DIAGNOSIS — E1169 Type 2 diabetes mellitus with other specified complication: Secondary | ICD-10-CM | POA: Diagnosis not present

## 2022-08-08 DIAGNOSIS — I10 Essential (primary) hypertension: Secondary | ICD-10-CM | POA: Diagnosis not present

## 2022-08-08 DIAGNOSIS — J449 Chronic obstructive pulmonary disease, unspecified: Secondary | ICD-10-CM | POA: Diagnosis not present

## 2022-08-08 DIAGNOSIS — E785 Hyperlipidemia, unspecified: Secondary | ICD-10-CM | POA: Diagnosis not present

## 2022-08-12 NOTE — Progress Notes (Unsigned)
Cardiology Office Note:    Date:  08/12/2022   ID:  Nicholas Camacho, DOB [birth year], MRN [id number]  PCP:  Farris Has, MD   [facility] Providers Cardiologist:  Meriam Sprague, MD {   Referring MD: Farris Has, MD    History of Present Illness:    Nicholas Camacho is a 71 y.o. male with a hx of pAfib on apixaban, HTN, HLD, COPD, OSA, small AAA, and iliac aneurysm who was previously followed by Dr. Delton See who now presents to clinic for follow-up.  Per review of the record, the patient underwent coronary CTA 08/20/17 for mild fatigue on exertion showed calcium score of 3244 which was the 99th percentile for age and sex.  Moderate stenosis in the LAD and RCA with suspicion for severe stenosis in the mid RCA.  Cardiac catheterization 08/30/17 showed moderate nonobstructive calcific stenosis in the RCA with mild nonobstructive calcific stenosis in the LAD and circumflex normal LV function.  Medical management was recommended as well as smoking cessation.    Patient suffered a stroke in 06/2020 due to missed doses of eliquis. His head CT did not show any changes other than chronic microvascular changes.  Brain MRI showed Cluster of small acute infarctions at the left frontoparietal junction region, consistent with left MCA branch vessel insult. No large confluent infarction. No swelling or hemorrhage. 2. Mild to moderate chronic small-vessel ischemic changes of the cerebral hemispheric white matter.  Was seen 04/21/21 where he was doing well. Recovered from his CVA. No CV symptoms.  Was last seen in clinic on 01/2022 where he was stable from a CV standpoint. Had chronic dyspnea and was not very active at baseline. Had recently recovered from COVID.  Today, ***  Past Medical History:  Diagnosis Date   COPD (chronic obstructive pulmonary disease) (HCC)    Disturbance of skin sensation 06/24/2013   Dysrhythmia    Elevated cholesterol    History of echocardiogram    Echo  4/17: EF 60-65%, no RWMA, Gr 2 DD   Hypertension    Sleep disturbance, unspecified 06/24/2013    Past Surgical History:  Procedure Laterality Date   ESOPHAGOGASTRODUODENOSCOPY N/A 01/08/2016   Procedure: ESOPHAGOGASTRODUODENOSCOPY (EGD);  Surgeon: Carman Ching, MD;  Location: [facility];  Service: Endoscopy;  Laterality: N/A;   fractured pelvis     INGUINAL HERNIA REPAIR Bilateral    LEFT HEART CATH AND CORONARY ANGIOGRAPHY N/A 08/30/2017   Procedure: LEFT HEART CATH AND CORONARY ANGIOGRAPHY;  Surgeon: Kathleene Hazel, MD;  Location: [facility] INVASIVE CV LAB;  Service: Cardiovascular;  Laterality: N/A;   reconstruct left heel  2002   TONSILLECTOMY      Current Medications: No outpatient medications have been marked as taking for the 08/15/22 encounter (Appointment) with Meriam Sprague, MD.     Allergies:   Morphine and related   Social History   Socioeconomic History   Marital status: Married    Spouse name: Not on file   Number of children: 2   Years of education: COLLEGE   Highest education level: Not on file  Occupational History   Occupation: retired  Tobacco Use   Smoking status: Former    Packs/day: 1.50    Years: 45.00    Total pack years: 67.50    Types: Cigarettes    Quit date: 11/19/2015    Years since quitting: 6.7   Smokeless tobacco: Never   Tobacco comments:    pt quit smoking!! 11/19/15  Vaping Use  Vaping Use: Never used  Substance and Sexual Activity   Alcohol use: No    Alcohol/week: 0.0 standard drinks of alcohol   Drug use: No   Sexual activity: Not on file  Other Topics Concern   Not on file  Social History Narrative   Not on file   Social Determinants of Health   Financial Resource Strain: Not on file  Food Insecurity: Not on file  Transportation Needs: Not on file  Physical Activity: Not on file  Stress: Not on file  Social Connections: Not on file     Family History: The patient's family history includes Alcoholism in  his sister; Diabetes in his mother; Drug abuse in his sister; Kidney failure in his father; Prostate cancer in his father. There is no history of Stroke.  ROS:   Please see the history of present illness.    Review of Systems  Constitutional:  Positive for malaise/fatigue. Negative for chills and fever.  HENT:  Positive for congestion. Negative for sore throat.   Eyes:  Negative for blurred vision.  Respiratory:  Positive for cough, shortness of breath and wheezing.   Cardiovascular:  Negative for chest pain, palpitations, orthopnea, claudication, leg swelling and PND.  Gastrointestinal:  Negative for blood in stool, melena, nausea and vomiting.  Genitourinary:  Negative for hematuria.  Musculoskeletal:  Negative for falls.  Neurological:  Negative for dizziness and loss of consciousness.  Psychiatric/Behavioral:  Negative for substance abuse.      EKGs/Labs/Other Studies Reviewed:    The following studies were reviewed today: CT Chest 03/2022: FINDINGS: Cardiovascular: Aortic atherosclerosis. Tortuous thoracic aorta. Normal heart size with mild lipomatous hypertrophy of the interatrial septum. Left main and 3 vessel coronary artery calcification.   Mediastinum/Nodes: No mediastinal or definite hilar adenopathy, given limitations of unenhanced CT.   Lungs/Pleura: No pleural fluid. Mild centrilobular and paraseptal emphysema. Lower lobe predominant bronchial wall thickening. Again identified is relatively diffuse "tree-in-bud" micronodularity. This is primarily similar, with mild increase in the right lower lobe.   A left upper lobe larger pulmonary nodule of volume derived equivalent diameter 4.5 mm on image 53 is new.   Upper Abdomen: Incompletely imaged gallstone. Normal imaged portions of the liver, spleen, stomach, adrenal glands, kidneys. Again identified is parenchymal calcification throughout the pancreatic tail and upstream body.   Musculoskeletal: No acute osseous  abnormality.   IMPRESSION: 1. Lung-RADS 3, probably benign findings. Short-term follow-up in 6 months is recommended with repeat low-dose chest CT without contrast (please use the following order, "CT CHEST LCS NODULE FOLLOW-UP W/O CM"). New left upper lobe pulmonary nodule of volume derived equivalent diameter 4.5 mm. 2. 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 Felt to be slightly progressive in the right lower lobe. 3. Aortic atherosclerosis (ICD10-I70.0), coronary artery atherosclerosis and emphysema (ICD10-J43.9). 4. 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 5. Cholelithiasis  CTA Chest/Aorta 04/21/2021: FINDINGS: Cardiovascular: Scattered atherosclerotic change. No significant aneurysm or dissection. Maximal ascending thoracic aortic diameter 3.5 cm. Patent 3 vessel arch anatomy.   Pulmonary arteries are normal in caliber and patent. Negative for acute pulmonary embolus.   Normal heart size. No pericardial effusion. Native coronary atherosclerosis noted. Central venous structures are patent.   Mediastinum/Nodes: No enlarged mediastinal, hilar, or axillary lymph nodes. Thyroid gland, trachea, and esophagus demonstrate no significant findings.   Lungs/Pleura: Mild upper lobe predominant centrilobular emphysema pattern. No acute airspace process, collapse or consolidation. Negative for edema, or interstitial disease.  No pleural abnormality, effusion or pneumothorax. Trachea and central airways are patent.   Upper Abdomen: Cholelithiasis evident. Chronic calcifications of the distal pancreas. No acute inflammatory process. Abdominal atherosclerosis noted.   Musculoskeletal: Degenerative changes of the spine. Symmetric gynecomastia. No acute osseous finding. Sternum intact.   Review of the MIP images confirms the above findings.   IMPRESSION: Thoracic aortic atherosclerosis.  Negative for thoracic aortic aneurysm or dissection.   No other acute intrathoracic finding.   Cholelithiasis   Chronic distal pancreas calcifications.   Aortic Atherosclerosis (ICD10-I70.0) and Emphysema (ICD10-J43.9).  CT Chest 12/07/20: IMPRESSION: Lung-RADS 2, benign appearance or behavior. Continue annual screening with low-dose chest CT without contrast in 12 months.   Stable tree-in-bud nodularity in the upper lobes likely reflect chronic atypical mycobacterial infection or post infectious inflammatory scarring.   Aortic Atherosclerosis (ICD10-I70.0) and Emphysema (ICD10-J43.9).  Carotid ultrasound 06/2020: Summary:  Right Carotid: Velocities in the right ICA are consistent with a 1-39%  stenosis.   Left Carotid: Velocities in the left ICA are consistent with a 1-39%  stenosis.   Vertebrals: Bilateral vertebral arteries demonstrate antegrade flow.   Echo 06/02/2020:  1. Left ventricular ejection fraction, by estimation, is 55 to 60%. The  left ventricle has normal function. The left ventricle has no regional  wall motion abnormalities. Left ventricular diastolic parameters are  consistent with Grade I diastolic  dysfunction (impaired relaxation).   2. Right ventricular systolic function is normal. The right ventricular  size is normal.   3. The mitral valve is normal in structure. Trivial mitral valve  regurgitation. No evidence of mitral stenosis.   4. The aortic valve was not well visualized. Aortic valve regurgitation  is mild. Mild aortic valve sclerosis is present, with no evidence of  aortic valve stenosis.   5. The inferior vena cava is normal in size with greater than 50%  respiratory variability, suggesting right atrial pressure of 3 mmHg.   TTE 11/26/2019   1. Left ventricular ejection fraction, by estimation, is 55 to 60%. The  left ventricle has normal function. The left ventricle has no regional  wall motion abnormalities. Left ventricular diastolic  parameters are  consistent with Grade I diastolic  dysfunction (impaired relaxation).   2. Right ventricular systolic function is normal. The right ventricular  size is normal. Tricuspid regurgitation signal is inadequate for assessing  PA pressure.   3. The mitral valve is grossly normal. No evidence of mitral valve  regurgitation. No evidence of mitral stenosis.   4. The aortic valve is tricuspid. Aortic valve regurgitation is mild. No  aortic stenosis is present. Aortic regurgitation PHT measures 658 msec.   5. Aortic dilatation noted. There is mild dilatation of the aortic root  measuring 41 mm.   6. The inferior vena cava is normal in size with greater than 50%  respiratory variability, suggesting right atrial pressure of 3 mmHg.  Coronary Cath 08/30/17:  The left ventricular systolic function is normal. LV end diastolic pressure is normal. The left ventricular ejection fraction is 55-65% by visual estimate. There is no mitral valve regurgitation. Prox RCA lesion is 40% stenosed. Prox RCA to Mid RCA lesion is 50% stenosed. Mid RCA-1 lesion is 30% stenosed. Mid RCA-2 lesion is 50% stenosed. Prox Cx lesion is 40% stenosed. Ost LAD to Prox LAD lesion is 30% stenosed.   1. Moderate non-obstructive, calcific stenoses in the RCA 2. Mild non-obstructive, calcific stenoses in the LAD and Circumflex 3. Normal LV systolic function   Recommendations:  Medical management of CAD. Tobacco cessation.   EKG:  EKG is personally reviewed. 02/09/2022:  Sinus rhythm. Rate 91 bpm.   Recent Labs: No results found for requested labs within last 365 days.   Recent Lipid Panel    Component Value Date/Time   CHOL 88 06/03/2020 1400   CHOL 79 (L) 11/23/2019 1156   TRIG 82 06/03/2020 1400   HDL 24 (L) 06/03/2020 1400   HDL 25 (L) 11/23/2019 1156   CHOLHDL 3.7 06/03/2020 1400   VLDL 16 06/03/2020 1400   LDLCALC 48 06/03/2020 1400   LDLCALC 41 11/23/2019 1156     Risk  Assessment/Calculations:    CHA2DS2-VASc Score =    This indicates a  % annual risk of stroke. The patient's score is based upon:           Physical Exam:    VS:  There were no vitals taken for this visit.    Wt Readings from Last 3 Encounters:  05/04/22 209 lb 3.2 oz (94.9 kg)  02/09/22 207 lb 6.4 oz (94.1 kg)  12/26/21 212 lb 3.2 oz (96.3 kg)     GEN:  Well nourished, well developed in no acute distress HEENT: Normal NECK: No JVD; No carotid bruits CARDIAC: Irregular, no murmurs RESPIRATORY:  Diffuse, scattered wheezes with inspiration and expiration ABDOMEN: Soft, non-tender, non-distended MUSCULOSKELETAL:  No edema; No deformity  SKIN: Warm and dry NEUROLOGIC:  Alert and oriented x 3 PSYCHIATRIC:  Normal affect   ASSESSMENT:    No diagnosis found.   PLAN:    In order of problems listed above:  #Coronary Artery Disease: Cardiac catheterization 08/30/17 showed moderate nonobstructive calcific stenosis in the RCA with mild nonobstructive calcific stenosis in the LAD and circumflex normal LV function.  On medical management. No anginal symptoms. -Not on ASA due to need for [facility] -Continue losartan 50mg  daily -Continue lipitor 80mg  daily -No BB due to severe COPD  #Paroxysmal Afib: CHADs-vasc 6. Tolerating AC as prescribed with no palpitations.  -Continue apixaban 5mg  BID -Continue dilt 180mg  daily -History of GIB with xarelto  #COPD: -Continue home inhalers -Follows with Pulm -Has yearly CT screening for cancer; last 03/2021  #HLD: -Continue lipitor 80mg  daily -Labs per PCP  #HTN: Controlled with BP <120s/80s at home -Continue losartan 50mg  daily -Continue dilt 180mg  daily  #Lung Nodules: Followed by Pulm. Due for repeat CT chest in 52months for new LUL nodule measuring 4.62mm.  #Ascending aortic aneurysm: Normal on CTA chest. No need for further monitoring  #Mild Carotid Disease: 1-39% bilaterally.  -Will repeat for monitoring -Not on ASA due to  need for apixaban -Continue lipitor 80mg  daily  Follow-up:  6   Medication Adjustments/Labs and Tests Ordered: Current medicines are reviewed at length with the patient today.  Concerns regarding medicines are outlined above.   No orders of the defined types were placed in this encounter.  No orders of the defined types were placed in this encounter.  There are no Patient Instructions on file for this visit.    Signed, , MD  08/12/2022 8:58 PM    [facility]

## 2022-08-15 ENCOUNTER — Ambulatory Visit: Payer: Medicare Other | Attending: Cardiology | Admitting: Cardiology

## 2022-08-15 VITALS — BP 126/78 | HR 75 | Ht 69.0 in | Wt 206.0 lb

## 2022-08-15 DIAGNOSIS — I480 Paroxysmal atrial fibrillation: Secondary | ICD-10-CM | POA: Diagnosis not present

## 2022-08-15 DIAGNOSIS — I2510 Atherosclerotic heart disease of native coronary artery without angina pectoris: Secondary | ICD-10-CM | POA: Diagnosis not present

## 2022-08-15 DIAGNOSIS — E785 Hyperlipidemia, unspecified: Secondary | ICD-10-CM

## 2022-08-15 DIAGNOSIS — I6523 Occlusion and stenosis of bilateral carotid arteries: Secondary | ICD-10-CM

## 2022-08-15 DIAGNOSIS — Z87891 Personal history of nicotine dependence: Secondary | ICD-10-CM

## 2022-08-15 DIAGNOSIS — I63419 Cerebral infarction due to embolism of unspecified middle cerebral artery: Secondary | ICD-10-CM

## 2022-08-15 DIAGNOSIS — I10 Essential (primary) hypertension: Secondary | ICD-10-CM

## 2022-08-15 DIAGNOSIS — J9611 Chronic respiratory failure with hypoxia: Secondary | ICD-10-CM | POA: Diagnosis not present

## 2022-08-15 DIAGNOSIS — D6869 Other thrombophilia: Secondary | ICD-10-CM

## 2022-08-15 NOTE — Patient Instructions (Signed)
Medication Instructions:   Your physician recommends that you continue on your current medications as directed. Please refer to the Current Medication list given to you today.  *If you need a refill on your cardiac medications before your next appointment, please call your pharmacy*   Follow-Up: At  HeartCare, you and your health needs are our priority.  As part of our continuing mission to provide you with exceptional heart care, we have created designated Provider Care Teams.  These Care Teams include your primary Cardiologist (physician) and Advanced Practice Providers (APPs -  Physician Assistants and Nurse Practitioners) who all work together to provide you with the care you need, when you need it.  We recommend signing up for the patient portal called "MyChart".  Sign up information is provided on this After Visit Summary.  MyChart is used to connect with patients for Virtual Visits (Telemedicine).  Patients are able to view lab/test results, encounter notes, upcoming appointments, etc.  Non-urgent messages can be sent to your provider as well.   To learn more about what you can do with MyChart, go to [email or web address].    Your next appointment:   6 month(s)  The format for your next appointment:   In Person  Provider:   Heather E Pemberton, MD     Important Information About Sugar       

## 2022-08-15 NOTE — Progress Notes (Signed)
Cardiology Office Note:    Date:  08/15/2022   ID:  [name], DOB [birth year], MRN [id number]  PCP:  London Pepper, MD   [facility] Providers Cardiologist:  Freada Bergeron, MD {   Referring MD: London Pepper, MD    History of Present Illness:    Nicholas Camacho is a 71 y.o. male with a hx of pAfib on apixaban, HTN, HLD, COPD, OSA, small AAA, and iliac aneurysm who was previously followed by Dr. Meda Coffee who now presents to clinic for follow-up.  Per review of the record, the patient underwent coronary CTA 08/20/17 for mild fatigue on exertion showed calcium score of 3244 which was the 99th percentile for age and sex.  Moderate stenosis in the LAD and RCA with suspicion for severe stenosis in the mid RCA.  Cardiac catheterization 08/30/17 showed moderate nonobstructive calcific stenosis in the RCA with mild nonobstructive calcific stenosis in the LAD and circumflex normal LV function.  Medical management was recommended as well as smoking cessation.    Patient suffered a stroke in 06/2020 due to missed doses of eliquis. His head CT did not show any changes other than chronic microvascular changes.  Brain MRI showed Cluster of small acute infarctions at the left frontoparietal junction region, consistent with left MCA branch vessel insult. No large confluent infarction. No swelling or hemorrhage. 2. Mild to moderate chronic small-vessel ischemic changes of the cerebral hemispheric white matter.  Was seen 04/21/21 where he was doing well. Recovered from his CVA. No CV symptoms.  Was last seen in clinic on 01/2022 where he was stable from a CV standpoint. Had chronic dyspnea and was not very active at baseline. Had recently recovered from [place].  Today, he states that he is doing well overall. He reports x1 dizzy spell this morning while he was fixing his hair. He had some toast but had not yet had any water prior to this event. He had already taken his blood pressure  medications prior to the episode of dizziness. This resolved quickly and he suspects that it may be due to moving too quickly. He denies any precipitating symptoms including racing heartbeats.  He reports chronic, waxing and waning shortness of breath secondary to his COPD. He notes that he has had an intermittent, dry chronic cough. Patient denies any recent change in his breathing. He is able to walk and perform ADLs with minimal difficulty. He is no longer smoking.  He denies any bleeding problems including hematuria or bloody in his stools.  He states that his Synthroid dose was recently increased.  The patient denies palpitations, chest pain, chest pressure, PND, orthopnea, or leg swelling. Denies fever, chills, nausea, or vomiting. Denies syncope or presyncope.   Past Medical History:  Diagnosis Date   COPD (chronic obstructive pulmonary disease) ([facility])    Disturbance of skin sensation 06/24/2013   Dysrhythmia    Elevated cholesterol    History of echocardiogram    Echo 4/17: EF 60-65%, no RWMA, Gr 2 DD   Hypertension    Sleep disturbance, unspecified 06/24/2013    Past Surgical History:  Procedure Laterality Date   ESOPHAGOGASTRODUODENOSCOPY N/A 01/08/2016   Procedure: ESOPHAGOGASTRODUODENOSCOPY (EGD);  Surgeon: Laurence Spates, MD;  Location: [facility] ENDOSCOPY;  Service: Endoscopy;  Laterality: N/A;   fractured pelvis     INGUINAL HERNIA REPAIR Bilateral    LEFT HEART CATH AND CORONARY ANGIOGRAPHY N/A 08/30/2017   Procedure: LEFT HEART CATH AND CORONARY ANGIOGRAPHY;  Surgeon: Burnell Blanks, MD;  Location: [facility] INVASIVE CV LAB;  Service: Cardiovascular;  Laterality: N/A;   reconstruct left heel  2002   TONSILLECTOMY      Current Medications: Current Meds  Medication Sig   apixaban (ELIQUIS) 5 MG TABS tablet Take 1 tablet (5 mg total) by mouth 2 (two) times daily.   atorvastatin (LIPITOR) 80 MG tablet Take 1 tablet (80 mg total) by mouth daily.   Benralizumab (FASENRA PEN) 30  MG/ML SOAJ Inject 1 mL (30 mg total) into the skin every 8 (eight) weeks.   Cyanocobalamin 1000 MCG CAPS Take 1,000 mcg by mouth daily.   diltiazem (CARDIZEM CD) 180 MG 24 hr capsule Take 1 capsule (180 mg total) by mouth daily.   fluticasone (FLONASE) 50 MCG/ACT nasal spray SHAKE LIQUID AND USE 2 SPRAYS IN EACH NOSTRIL TWICE DAILY   Influenza Vac High-Dose Quad (FLUZONE HIGH-DOSE QUADRIVALENT IM) ADM 0.7ML IM UTD   ipratropium (ATROVENT) 0.03 % nasal spray Place 2 sprays into both nostrils every 12 (twelve) hours.   ipratropium-albuterol (DUONEB) 0.5-2.5 (3) MG/3ML SOLN USE 1 VIAL VIA NEBULIZER(3ML) THREE TIMES DAILY   levothyroxine (SYNTHROID) 50 MCG tablet Take 50 mcg by mouth every morning.   losartan (COZAAR) 50 MG tablet TAKE 1 TABLET(50 MG) BY MOUTH DAILY   montelukast (SINGULAIR) 10 MG tablet TAKE 1 TABLET(10 MG) BY MOUTH AT BEDTIME   nitroGLYCERIN (NITROSTAT) 0.4 MG SL tablet Place 1 tablet under the tongue every 5 (five) minutes as needed for chest pain.   Respiratory Therapy Supplies (FLUTTER) DEVI 1 Device by Does not apply route in the morning and at bedtime.   TRELEGY ELLIPTA 100-62.5-25 MCG/ACT AEPB INHALE 1 PUFF INTO THE LUNGS DAILY   VENTOLIN HFA 108 (90 Base) MCG/ACT inhaler INHALE 2 PUFFS BY MOUTH EVERY 6 HOURS AS NEEDED FOR WHEEZING OR SHORTNESS OF BREATH     Allergies:   Morphine and Morphine and related   Social History   Socioeconomic History   Marital status: Married    Spouse name: Not on file   Number of children: 2   Years of education: COLLEGE   Highest education level: Not on file  Occupational History   Occupation: retired  Tobacco Use   Smoking status: Former    Packs/day: 1.50    Years: 45.00    Total pack years: 67.50    Types: Cigarettes    Quit date: 11/19/2015    Years since quitting: 6.7   Smokeless tobacco: Never   Tobacco comments:    pt quit smoking!! 11/19/15  Vaping Use   Vaping Use: Never used  Substance and Sexual Activity   Alcohol  use: No    Alcohol/week: 0.0 standard drinks of alcohol   Drug use: No   Sexual activity: Not on file  Other Topics Concern   Not on file  Social History Narrative   Not on file   Social Determinants of Health   Financial Resource Strain: Not on file  Food Insecurity: Not on file  Transportation Needs: Not on file  Physical Activity: Not on file  Stress: Not on file  Social Connections: Not on file     Family History: The patient's family history includes Alcoholism in his sister; Diabetes in his mother; Drug abuse in his sister; Kidney failure in his father; Prostate cancer in his father. There is no history of Stroke.  ROS:   Please see the history of present illness.    Review of Systems  Constitutional:  Negative for chills and fever.  HENT:  Negative for nosebleeds and tinnitus.   Eyes:  Negative for blurred vision and pain.  Respiratory:  Positive for cough and shortness of breath. Negative for hemoptysis and stridor.   Cardiovascular:  Negative for chest pain, palpitations, orthopnea, claudication, leg swelling and PND.  Gastrointestinal:  Negative for blood in stool, diarrhea, nausea and vomiting.  Genitourinary:  Negative for dysuria and hematuria.  Musculoskeletal:  Negative for falls.  Neurological:  Positive for dizziness. Negative for loss of consciousness and headaches.  Endo/Heme/Allergies:  Does not bruise/bleed easily.  Psychiatric/Behavioral:  Negative for depression, hallucinations and substance abuse. The patient does not have insomnia.      EKGs/Labs/Other Studies Reviewed:    The following studies were reviewed today:  CT Chest 03/2022: FINDINGS: Cardiovascular: Aortic atherosclerosis. Tortuous thoracic aorta. Normal heart size with mild lipomatous hypertrophy of the interatrial septum. Left main and 3 vessel coronary artery calcification.   Mediastinum/Nodes: No mediastinal or definite hilar adenopathy, given limitations of unenhanced CT.    Lungs/Pleura: No pleural fluid. Mild centrilobular and paraseptal emphysema. Lower lobe predominant bronchial wall thickening. Again identified is relatively diffuse "tree-in-bud" micronodularity. This is primarily similar, with mild increase in the right lower lobe.   A left upper lobe larger pulmonary nodule of volume derived equivalent diameter 4.5 mm on image 53 is new.   Upper Abdomen: Incompletely imaged gallstone. Normal imaged portions of the liver, spleen, stomach, adrenal glands, kidneys. Again identified is parenchymal calcification throughout the pancreatic tail and upstream body.   Musculoskeletal: No acute osseous abnormality.   IMPRESSION: 1. Lung-RADS 3, probably benign findings. Short-term follow-up in 6 months is recommended with repeat low-dose chest CT without contrast (please use the following order, "CT CHEST LCS NODULE FOLLOW-UP W/O CM"). New left upper lobe pulmonary nodule of volume derived equivalent diameter 4.5 mm. 2. 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 Felt to be slightly progressive in the right lower lobe. 3. Aortic atherosclerosis (ICD10-I70.0), coronary artery atherosclerosis and emphysema (ICD10-J43.9). 4. 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 5. Cholelithiasis  Carotid US 03/03/2022: Right Carotid: Velocities in the right ICA are consistent with a 1-39% stenosis. Left Carotid: Velocities in the left ICA are consistent with a 1-39% stenosis. Vertebrals: Bilateral vertebral arteries demonstrate antegrade flow. Subclavians: Normal flow hemodynamics were seen in bilateral subclavian arteries.  CTA Chest/Aorta 04/21/2021: FINDINGS: Cardiovascular: Scattered atherosclerotic change. No significant aneurysm or dissection. Maximal ascending thoracic aortic diameter 3.5 cm. Patent 3 vessel arch anatomy.   Pulmonary arteries are normal in caliber and  patent. Negative for acute pulmonary embolus.   Normal heart size. No pericardial effusion. Native coronary atherosclerosis noted. Central venous structures are patent.   Mediastinum/Nodes: No enlarged mediastinal, hilar, or axillary lymph nodes. Thyroid gland, trachea, and esophagus demonstrate no significant findings.   Lungs/Pleura: Mild upper lobe predominant centrilobular emphysema pattern. No acute airspace process, collapse or consolidation. Negative for edema, or interstitial disease.   No pleural abnormality, effusion or pneumothorax. Trachea and central airways are patent.   Upper Abdomen: Cholelithiasis evident. Chronic calcifications of the distal pancreas. No acute inflammatory process. Abdominal atherosclerosis noted.   Musculoskeletal: Degenerative changes of the spine. Symmetric gynecomastia. No acute osseous finding. Sternum intact.   Review of the MIP images confirms the above findings.   IMPRESSION: Thoracic aortic atherosclerosis. Negative for thoracic aortic aneurysm or dissection.   No other acute intrathoracic finding.   Cholelithiasis   Chronic distal pancreas calcifications.   Aortic Atherosclerosis (  ICD10-I70.0) and Emphysema (ICD10-J43.9).  CT Chest 12/07/20: IMPRESSION: Lung-RADS 2, benign appearance or behavior. Continue annual screening with low-dose chest CT without contrast in 12 months.   Stable tree-in-bud nodularity in the upper lobes likely reflect chronic atypical mycobacterial infection or post infectious inflammatory scarring.   Aortic Atherosclerosis (ICD10-I70.0) and Emphysema (ICD10-J43.9).  Carotid ultrasound 06/2020: Summary:  Right Carotid: Velocities in the right ICA are consistent with a 1-39%  stenosis.   Left Carotid: Velocities in the left ICA are consistent with a 1-39%  stenosis.   Vertebrals: Bilateral vertebral arteries demonstrate antegrade flow.   Echo 06/02/2020:  1. Left ventricular ejection fraction,  by estimation, is 55 to 60%. The  left ventricle has normal function. The left ventricle has no regional  wall motion abnormalities. Left ventricular diastolic parameters are  consistent with Grade I diastolic  dysfunction (impaired relaxation).   2. Right ventricular systolic function is normal. The right ventricular  size is normal.   3. The mitral valve is normal in structure. Trivial mitral valve  regurgitation. No evidence of mitral stenosis.   4. The aortic valve was not well visualized. Aortic valve regurgitation  is mild. Mild aortic valve sclerosis is present, with no evidence of  aortic valve stenosis.   5. The inferior vena cava is normal in size with greater than 50%  respiratory variability, suggesting right atrial pressure of 3 mmHg.   TTE 11/26/2019   1. Left ventricular ejection fraction, by estimation, is 55 to 60%. The  left ventricle has normal function. The left ventricle has no regional  wall motion abnormalities. Left ventricular diastolic parameters are  consistent with Grade I diastolic  dysfunction (impaired relaxation).   2. Right ventricular systolic function is normal. The right ventricular  size is normal. Tricuspid regurgitation signal is inadequate for assessing  PA pressure.   3. The mitral valve is grossly normal. No evidence of mitral valve  regurgitation. No evidence of mitral stenosis.   4. The aortic valve is tricuspid. Aortic valve regurgitation is mild. No  aortic stenosis is present. Aortic regurgitation PHT measures 658 msec.   5. Aortic dilatation noted. There is mild dilatation of the aortic root  measuring 41 mm.   6. The inferior vena cava is normal in size with greater than 50%  respiratory variability, suggesting right atrial pressure of 3 mmHg.  Coronary Cath 08/30/17:  The left ventricular systolic function is normal. LV end diastolic pressure is normal. The left ventricular ejection fraction is 55-65% by visual estimate. There is  no mitral valve regurgitation. Prox RCA lesion is 40% stenosed. Prox RCA to Mid RCA lesion is 50% stenosed. Mid RCA-1 lesion is 30% stenosed. Mid RCA-2 lesion is 50% stenosed. Prox Cx lesion is 40% stenosed. Ost LAD to Prox LAD lesion is 30% stenosed.   1. Moderate non-obstructive, calcific stenoses in the RCA 2. Mild non-obstructive, calcific stenoses in the LAD and Circumflex 3. Normal LV systolic function   Recommendations: Medical management of CAD. Tobacco cessation.   EKG:  EKG is personally reviewed. No new tracing today 02/09/2022:  Sinus rhythm. Rate 91 bpm.   Recent Labs: No results found for requested labs within last 365 days.   Recent Lipid Panel    Component Value Date/Time   CHOL 88 06/03/2020 1400   CHOL 79 (L) 11/23/2019 1156   TRIG 82 06/03/2020 1400   HDL 24 (L) 06/03/2020 1400   HDL 25 (L) 11/23/2019 1156   CHOLHDL 3.7 06/03/2020 1400  VLDL 16 06/03/2020 1400   LDLCALC 48 06/03/2020 1400   LDLCALC 41 11/23/2019 1156     Risk Assessment/Calculations:    CHA2DS2-VASc Score = 5  This indicates a 7.2% annual risk of stroke. The patient's score is based upon: CHF History: 0 HTN History: 1 Diabetes History: 0 Stroke History: 2 Vascular Disease History: 1 Age Score: 1 Gender Score: 0          Physical Exam:    VS:  BP 126/78   Pulse 75   Ht 5\' 9"  (1.753 m)   Wt 206 lb (93.4 kg)   SpO2 93%   BMI 30.42 kg/m     Wt Readings from Last 3 Encounters:  08/15/22 206 lb (93.4 kg)  05/04/22 209 lb 3.2 oz (94.9 kg)  02/09/22 207 lb 6.4 oz (94.1 kg)     GEN:  Well nourished, well developed in no acute distress HEENT: Normal NECK: No JVD; No carotid bruits CARDIAC: Irregular, no murmurs RESPIRATORY:  Basilar rhonchi. Good air movement ABDOMEN: Soft, non-tender, non-distended MUSCULOSKELETAL:  No edema; No deformity  SKIN: Warm and dry NEUROLOGIC:  Alert and oriented x 3 PSYCHIATRIC:  Normal affect   ASSESSMENT:    1. Coronary artery  disease involving native coronary artery of native heart without angina pectoris   2. PAF (paroxysmal atrial fibrillation) ([place])   3. Essential hypertension   4. Chronic respiratory failure with hypoxia (HCC)   5. Cerebrovascular accident (CVA) due to embolism of middle cerebral artery, unspecified blood vessel laterality ([place])   6. Hyperlipidemia, unspecified hyperlipidemia type   7. Former smoker   8. Bilateral carotid artery stenosis   9. Secondary hypercoagulable state ([place])      PLAN:    In order of problems listed above:  #Coronary Artery Disease: Cardiac catheterization 08/30/17 showed moderate nonobstructive calcific stenosis in the RCA with mild nonobstructive calcific stenosis in the LAD and circumflex normal LV function.  On medical management. No anginal symptoms. -Not on ASA due to need for [facility] -Continue losartan 50mg  daily -Continue lipitor 80mg  daily -No BB due to severe COPD  #Paroxysmal Afib: CHADs-vasc 5. Tolerating AC as prescribed with no palpitations.  -Continue apixaban 5mg  BID -Continue dilt 180mg  daily -History of GIB with xarelto  #COPD: -Continue home inhalers -Follows with Pulm -Has yearly CT screening for cancer; last 03/2021  #HLD: -Continue lipitor 80mg  daily -Labs per PCP  #HTN: Controlled with BP <120s/80s at home -Continue losartan 50mg  daily -Continue dilt 180mg  daily  #Lung Nodules: Followed by Pulm. Due for repeat CT chest in 9months for new LUL nodule measuring 4.80mm.  #Ascending aortic aneurysm: Normal on CTA chest. No need for further monitoring  #Mild Carotid Disease: 1-39% bilaterally.  -Will repeat for monitoring -Not on ASA due to need for apixaban -Continue lipitor 80mg  daily  Follow-up:  6 months   Medication Adjustments/Labs and Tests Ordered: Current medicines are reviewed at length with the patient today.  Concerns regarding medicines are outlined above.   No orders of the defined types were placed in this  encounter.  No orders of the defined types were placed in this encounter.  Patient Instructions  Medication Instructions:   Your physician recommends that you continue on your current medications as directed. Please refer to the Current Medication list given to you today.  *If you need a refill on your cardiac medications before your next appointment, please call your pharmacy*    Follow-Up: At [facility], you and your health needs  are our priority.  As part of our continuing mission to provide you with exceptional heart care, we have created designated Provider Care Teams.  These Care Teams include your primary Cardiologist (physician) and Advanced Practice Providers (APPs -  Physician Assistants and Nurse Practitioners) who all work together to provide you with the care you need, when you need it.  We recommend signing up for the patient portal called "MyChart".  Sign up information is provided on this After Visit Summary.  MyChart is used to connect with patients for Virtual Visits (Telemedicine).  Patients are able to view lab/test results, encounter notes, upcoming appointments, etc.  Non-urgent messages can be sent to your provider as well.   To learn more about what you can do with MyChart, go to [email or web address].    Your next appointment:   6 month(s)  The format for your next appointment:   In Person  Provider:   Freada Bergeron, MD      Important Information About Sugar         I,Alexis Herring,acting as a scribe for Freada Bergeron, MD.,have documented all relevant documentation on the behalf of Freada Bergeron, MD,as directed by  Freada Bergeron, MD while in the presence of Freada Bergeron, MD.  I, Freada Bergeron, MD, have reviewed all documentation for this visit. The documentation on 08/15/22 for the exam, diagnosis, procedures, and orders are all accurate and complete.   Signed, Freada Bergeron, MD  08/15/2022  12:41 PM    [place]

## 2022-08-27 DIAGNOSIS — E039 Hypothyroidism, unspecified: Secondary | ICD-10-CM | POA: Diagnosis not present

## 2022-08-30 ENCOUNTER — Other Ambulatory Visit: Payer: Self-pay | Admitting: Pulmonary Disease

## 2022-08-30 ENCOUNTER — Telehealth: Payer: Self-pay | Admitting: Pulmonary Disease

## 2022-08-31 MED ORDER — TRELEGY ELLIPTA 100-62.5-25 MCG/ACT IN AEPB: 1.0000 | INHALATION_SPRAY | Freq: Every day | RESPIRATORY_TRACT | 4 refills | Status: DC

## 2022-08-31 NOTE — Telephone Encounter (Signed)
Rx for pt's Trelegy has been sent to preferred pharmacy. Called and spoke with pt letting him know this had been done and he verbalized understanding. Nothing further needed.

## 2022-09-17 DIAGNOSIS — Z1211 Encounter for screening for malignant neoplasm of colon: Secondary | ICD-10-CM | POA: Diagnosis not present

## 2022-10-02 ENCOUNTER — Telehealth: Payer: Self-pay | Admitting: Pulmonary Disease

## 2022-10-02 NOTE — Telephone Encounter (Signed)
Called and spoke with pt who stated 10 days ago he came down with something but not sure with exactly what. Pt has not had any positive covid test during this timeframe.  Pt has had complaints of increased SOB, wheezing, coughing up green phlegm. Pt has been taking tylenol cold and sinus, using his nebulizer at least 3-4 times a day. Pt has not used his rescue inhaler.   Stated to pt that it would be best for him to be evaluated in person to see what might need to be done and he verbalized understanding. Appt scheduled for pt with JD. Nothing further needed.

## 2022-10-03 ENCOUNTER — Telehealth: Payer: Self-pay | Admitting: Pulmonary Disease

## 2022-10-03 NOTE — Telephone Encounter (Signed)
Yes, ok to schedule for virtual visit.  Thanks, JD

## 2022-10-03 NOTE — Telephone Encounter (Signed)
Pt's appt has been changed to a virtual visit. Called and spoke with pt letting him know this info. Also stated to pt if he became much worse between now and the time for his appt that he should go to either UC or ED to be evaluated and he verbalized understanding. Nothing further needed.

## 2022-10-03 NOTE — Telephone Encounter (Signed)
Called and spoke with patient, he states his wife was diagnose yesterday with RSV and Covid. Today pt states he has a runny nose, scratchy throat, no fever. Pt states he is feeling run down today.   Pt is scheduled for a appointment tomorrow with Dr. Erin Fulling, he states he doesn't feel like coming in.  Dr. Erin Fulling can we offer him a my chart appointment? Please advise   Pharmacy is [facility] on [address]

## 2022-10-03 NOTE — Telephone Encounter (Signed)
Patient would like the nurse to call him regarding his appt. Tomorrow.  Please call to discuss at [phone]

## 2022-10-04 ENCOUNTER — Encounter: Payer: Self-pay | Admitting: Pulmonary Disease

## 2022-10-04 ENCOUNTER — Telehealth (INDEPENDENT_AMBULATORY_CARE_PROVIDER_SITE_OTHER): Payer: BLUE CROSS/BLUE SHIELD | Admitting: Pulmonary Disease

## 2022-10-04 DIAGNOSIS — J069 Acute upper respiratory infection, unspecified: Secondary | ICD-10-CM | POA: Diagnosis not present

## 2022-10-04 DIAGNOSIS — J441 Chronic obstructive pulmonary disease with (acute) exacerbation: Secondary | ICD-10-CM

## 2022-10-04 DIAGNOSIS — J4489 Other specified chronic obstructive pulmonary disease: Secondary | ICD-10-CM

## 2022-10-04 MED ORDER — AZITHROMYCIN 250 MG PO TABS: ORAL_TABLET | ORAL | 0 refills | Status: DC

## 2022-10-04 MED ORDER — PREDNISONE 10 MG PO TABS: ORAL_TABLET | ORAL | 0 refills | Status: AC

## 2022-10-04 NOTE — Progress Notes (Signed)
Virtual Visit via Video Note  I connected with [name] on 10/04/22 at  9:15 AM EST by a video enabled telemedicine application and verified that I am speaking with the correct person using two identifiers.  Location: Patient: home Provider: office   I discussed the limitations of evaluation and management by telemedicine and the availability of in person appointments. The patient expressed understanding and agreed to proceed.  History of Present Illness: Nicholas Camacho is a 72 year old male, former smoker with asthma-COPD overlap syndrome and nocturnal hypoxemia who returns to clinic for follow up.    Continues on trelegy daily and fasenra injections along with PRN duonebs.   His wife is sick with covid and RSV. His symptoms started prior to 12/25. He is having shortness of breath and wheezing. Cough that is productive with green phlegm. No fevers. He had chills over past couple of days. His appetite is down.   Observations/Objective: Congested sounding No acute distress  Assessment and Plan: Asthma-COPD Overlap Syndrome COPD with Exacerbation Upper respiratory viral infection  Plan: - continue trelegy daily - continue nebulizer treatments as needed - Start prednisone taper - start Zpak   Follow Up Instructions: - follow up in 6 months   I discussed the assessment and treatment plan with the patient. The patient was provided an opportunity to ask questions and all were answered. The patient agreed with the plan and demonstrated an understanding of the instructions.   The patient was advised to call back or seek an in-person evaluation if the symptoms worsen or if the condition fails to improve as anticipated.  I provided 20 minutes of non-face-to-face time during this encounter.   Freddi Starr, MD

## 2022-10-04 NOTE — Patient Instructions (Signed)
Start prednisone taper  Start Zpak  Continue trelegy ellipta 1 puff daily and as needed nebulizer treatments  Follow up in 6 months, call sooner if needed

## 2022-10-09 ENCOUNTER — Other Ambulatory Visit: Payer: Self-pay | Admitting: Pulmonary Disease

## 2022-10-09 DIAGNOSIS — J309 Allergic rhinitis, unspecified: Secondary | ICD-10-CM

## 2022-10-24 ENCOUNTER — Ambulatory Visit
Admission: RE | Admit: 2022-10-24 | Discharge: 2022-10-24 | Disposition: A | Discharge status: Home / Self Care | Payer: BLUE CROSS/BLUE SHIELD | Source: Ambulatory Visit | Attending: Acute Care | Admitting: Acute Care

## 2022-10-24 DIAGNOSIS — J9811 Atelectasis: Secondary | ICD-10-CM | POA: Diagnosis not present

## 2022-10-24 DIAGNOSIS — J439 Emphysema, unspecified: Secondary | ICD-10-CM | POA: Diagnosis not present

## 2022-10-24 DIAGNOSIS — I700 Atherosclerosis of aorta: Secondary | ICD-10-CM | POA: Diagnosis not present

## 2022-10-24 DIAGNOSIS — Z87891 Personal history of nicotine dependence: Secondary | ICD-10-CM

## 2022-10-24 DIAGNOSIS — R911 Solitary pulmonary nodule: Secondary | ICD-10-CM

## 2022-10-25 ENCOUNTER — Telehealth: Payer: Self-pay | Admitting: Acute Care

## 2022-10-25 NOTE — Telephone Encounter (Signed)
Called and spoke with Tiffany. She was calling in regards to the patient's lung cancer screening CT results. Below is a copy of the impressions:   IMPRESSION: 1. Lung-RADS 4Bs, suspicious. Additional imaging evaluation or consultation with Pulmonology or Thoracic Surgery recommended. Perifissural right middle lobe, 11.6 mm, 159/4. 2. S modifier refers to progressive bronchial wall thickening, mucoid impaction and extensive tree-in-bud nodularity involving the right lower lobe. These findings may reflect sequelae of progressive recurrent aspiration or pneumonia. Proximal endobronchial lesion is less favored but cannot be excluded with a high degree of certainty. 3. Coronary artery calcifications. 4. Gallstones. 5. Chronic pancreatitis. 6. Aortic Atherosclerosis (ICD10-I70.0) and Emphysema (ICD10-J43.9).   These results will be called to the ordering clinician or representative by the Radiologist Assistant, and communication documented in the PACS or [employer].  Will route to Judson Roch and the lung cancer screening pool.

## 2022-10-25 NOTE — Telephone Encounter (Signed)
[facility] Call Report   CT scan   Please call.

## 2022-10-26 ENCOUNTER — Other Ambulatory Visit: Payer: Self-pay | Admitting: Acute Care

## 2022-10-26 DIAGNOSIS — R911 Solitary pulmonary nodule: Secondary | ICD-10-CM

## 2022-10-26 NOTE — Telephone Encounter (Addendum)
I have called the patient with the results of his low-dose screening CT.  I explained that his scan was read as a lung RADS 4B, there is a nodule that has grown since his July 2023 scan from 8.9 mm to 11.6 mm. There was also notation of extensive mucoid impaction and tree-in-bud nodularities in the right lower lobe these may reflect sequelae of progressive recurrent aspiration or pneumonia.  Patient states that he and his wife were sick after Christmas.  He states he has no current shortness of breath, congestion, or indication that he is sick. He is in agreement with PET scan to reevaluate the nodule that has grown in the interval 6 months. Langley Gauss I have placed the order for the PET scan. Please fax results to PCP and let them know that we will be following up with a PET scan to better evaluate the area of concern. Patient should get a follow-up appointment with me once the scan has been done, I have requested that in the PET scan order. Thanks so much  Dr. Erin Fulling, including you so you know what is going on. Will let you know what PET scan shows.

## 2022-10-29 NOTE — Telephone Encounter (Signed)
Noted, thanks.   Nicholas Camacho

## 2022-10-29 NOTE — Telephone Encounter (Signed)
Spoke with pt and reviewed Dr. August Albino advice on medications and PET scans. Pt stated understanding. Nothing further needed at this time.

## 2022-10-29 NOTE — Telephone Encounter (Signed)
Results faxed to PCP with follow up plans included. 

## 2022-11-05 DIAGNOSIS — J449 Chronic obstructive pulmonary disease, unspecified: Secondary | ICD-10-CM | POA: Diagnosis not present

## 2022-11-05 DIAGNOSIS — I5030 Unspecified diastolic (congestive) heart failure: Secondary | ICD-10-CM | POA: Diagnosis not present

## 2022-11-05 DIAGNOSIS — E1169 Type 2 diabetes mellitus with other specified complication: Secondary | ICD-10-CM | POA: Diagnosis not present

## 2022-11-05 DIAGNOSIS — E785 Hyperlipidemia, unspecified: Secondary | ICD-10-CM | POA: Diagnosis not present

## 2022-11-05 DIAGNOSIS — I700 Atherosclerosis of aorta: Secondary | ICD-10-CM | POA: Diagnosis not present

## 2022-11-05 DIAGNOSIS — I4891 Unspecified atrial fibrillation: Secondary | ICD-10-CM | POA: Diagnosis not present

## 2022-11-12 ENCOUNTER — Ambulatory Visit (HOSPITAL_COMMUNITY)
Admission: RE | Admit: 2022-11-12 | Discharge: 2022-11-12 | Disposition: A | Discharge status: Home / Self Care | Payer: Medicare Other | Source: Ambulatory Visit | Attending: Acute Care | Admitting: Acute Care

## 2022-11-12 DIAGNOSIS — R911 Solitary pulmonary nodule: Secondary | ICD-10-CM | POA: Insufficient documentation

## 2022-11-12 LAB — GLUCOSE, CAPILLARY: Glucose-Capillary: 113 mg/dL — ABNORMAL HIGH (ref 70–99)

## 2022-11-12 MED ORDER — FLUDEOXYGLUCOSE F - 18 (FDG) INJECTION
10.2600 | Freq: Once | INTRAVENOUS | Status: AC
  Administered 2022-11-12: 10.26 via INTRAVENOUS

## 2022-11-16 ENCOUNTER — Other Ambulatory Visit: Payer: Self-pay

## 2022-11-16 ENCOUNTER — Encounter: Payer: Self-pay | Admitting: Acute Care

## 2022-11-16 ENCOUNTER — Telehealth: Payer: Self-pay | Admitting: Acute Care

## 2022-11-16 ENCOUNTER — Ambulatory Visit: Payer: Medicare Other | Admitting: Acute Care

## 2022-11-16 ENCOUNTER — Other Ambulatory Visit: Payer: Self-pay | Admitting: Acute Care

## 2022-11-16 VITALS — BP 132/74 | HR 74 | Temp 98.1°F | Ht 69.0 in | Wt 201.4 lb

## 2022-11-16 DIAGNOSIS — R911 Solitary pulmonary nodule: Secondary | ICD-10-CM

## 2022-11-16 DIAGNOSIS — Z87891 Personal history of nicotine dependence: Secondary | ICD-10-CM

## 2022-11-16 DIAGNOSIS — J441 Chronic obstructive pulmonary disease with (acute) exacerbation: Secondary | ICD-10-CM | POA: Diagnosis not present

## 2022-11-16 MED ORDER — PREDNISONE 10 MG PO TABS: ORAL_TABLET | ORAL | 0 refills | Status: DC

## 2022-11-16 MED ORDER — DOXYCYCLINE HYCLATE 100 MG PO TABS: 100.0000 mg | ORAL_TABLET | Freq: Two times a day (BID) | ORAL | 0 refills | Status: AC

## 2022-11-16 NOTE — Telephone Encounter (Signed)
Spoke with Saralyn Pilar at [facility] stated he needed clarification on Prednisone quantity. Advised the quantity should have been for 20 instead of 30. Prescription has been updated nothing further needed.

## 2022-11-16 NOTE — Progress Notes (Signed)
History of Present Illness Nicholas Camacho is a 72 y.o. male former smoker, (quit in 2017 with a 67  pack year smoking history) ,  with COPD  and abnormal Low Dose Ct chest 10/24/2022. He is followed by Dr. Erin Fulling. Maintenance : Trelegy Rescue : Albuterol   11/16/2022 Pt. Presents for follow up after PET scan . He had a Low Dose CT Chest 10/24/2022 which was read as a 4B.It was a 6 month follow up after a LR 3 reading.  PET scan was ordered   His PET scan shows no hypermetabolism in the RML nodule of concern.  I have asked him if he gets choked when he eats. He states he does not. He does have a very congested cough . Per his wife, he has this frequently, gets treated with antibiotic and prednisone and is usually clear for several weeks, then he gets congested again .  He has pulmonary nodules that wax and wane on CT chest imaging which makes for complicated screening.    Test Results: PET Scan 11/12/2022 The right middle lobe perifissural nodule is not well demonstrated on today's study. No hypermetabolism is identified. This is likely a benign regressing lymph node or loculated fluid in the fissure. 2. Small focus of hypermetabolism noted in the right infrahilar region without obvious CT abnormality. Possible peribronchial disease or small lymph node. 3. 4 cm infrarenal abdominal aortic aneurysm as above. 4. Recommend continued lung cancer screening CT surveillance.    10/24/2022  Low Dose CT Chest Lung-RADS 4Bs, suspicious. Additional imaging evaluation or consultation with Pulmonology or Thoracic Surgery recommended. Perifissural right middle lobe, 11.6 mm, 159/4. 2. S modifier refers to progressive bronchial wall thickening, mucoid impaction and extensive tree-in-bud nodularity involving the right lower lobe. These findings may reflect sequelae of progressive recurrent aspiration or pneumonia. Proximal endobronchial lesion is less favored but cannot be excluded with a high  degree of certainty. 3. Coronary artery calcifications. 4. Gallstones. 5. Chronic pancreatitis. 6. Aortic Atherosclerosis (ICD10-I70.0) and Emphysema (ICD10-J43.9).       Latest Ref Rng & Units 01/24/2021   12:43 PM 06/01/2020    6:07 PM 11/23/2019   11:56 AM  CBC  WBC 4.0 - 10.5 K/uL 7.7  8.6  8.6   Hemoglobin 13.0 - 17.0 g/dL 13.7  14.2  14.7   Hematocrit 39.0 - 52.0 % 41.9  46.3  43.8   Platelets 150.0 - 400.0 K/uL 251.0  265  373        Latest Ref Rng & Units 04/17/2021   12:01 PM 06/01/2020    6:07 PM 11/23/2019   11:56 AM  BMP  Glucose 65 - 99 mg/dL 157  136  132   BUN 8 - 27 mg/dL 19  12  13   $ Creatinine 0.76 - 1.27 mg/dL 1.03  1.02  1.13   BUN/Creat Ratio 10 - 24 18   12   $ Sodium 134 - 144 mmol/L 140  139  140   Potassium 3.5 - 5.2 mmol/L 4.3  4.2  4.3   Chloride 96 - 106 mmol/L 101  104  104   CO2 20 - 29 mmol/L 23  26  21   $ Calcium 8.6 - 10.2 mg/dL 9.7  9.5  9.3     BNP    Component Value Date/Time   BNP 54.0 12/22/2015 1042    ProBNP    Component Value Date/Time   PROBNP 70 11/23/2019 1156   PROBNP 55.0 01/04/2016 1204  PFT    Component Value Date/Time   FEV1PRE 1.58 05/04/2022 1030   FEV1POST 1.54 11/18/2019 1452   FVCPRE 3.09 05/04/2022 1030   FVCPOST 2.85 11/18/2019 1452   TLC 7.38 11/18/2019 1452   DLCOUNC 19.63 11/18/2019 1452   PREFEV1FVCRT 51 05/04/2022 1030   PSTFEV1FVCRT 54 11/18/2019 1452    NM PET Image Initial (PI) Skull Base To Thigh  Result Date: 11/13/2022 CLINICAL DATA:  Initial treatment strategy for pulmonary nodule. EXAM: NUCLEAR MEDICINE PET SKULL BASE TO THIGH TECHNIQUE: 10.26 mCi F-18 FDG was injected intravenously. Full-ring PET imaging was performed from the skull base to thigh after the radiotracer. CT data was obtained and used for attenuation correction and anatomic localization. Fasting blood glucose: 113 mg/dl COMPARISON:  Chest CT 10/24/2022 FINDINGS: Mediastinal blood pool activity: SUV max 2.34 Liver activity: SUV  max NA NECK: No hypermetabolic lymph nodes in the neck. Incidental CT findings: None. CHEST: The elongated perifissural nodule seen in the right middle lobe the recent chest CT is now barely visible on today's PET-CT. No hypermetabolism is demonstrated. This is likely a benign perifissural lymph node or some loculated fluid in the fissure. Small focus of hypermetabolism is noted in the right infrahilar region. SUV max is 3.54. No obvious CT abnormality. It could be a small lymph node or an area of peribronchial thickening. No enlarged or hypermetabolic mediastinal nodes. Incidental CT findings: Stable advanced atherosclerotic calcification involving the aorta and coronary arteries. Stable underlying emphysematous changes and pulmonary scarring. ABDOMEN/PELVIS: No abnormal hypermetabolic activity within the liver, pancreas, adrenal glands, or spleen. No hypermetabolic lymph nodes in the abdomen or pelvis. Incidental CT findings: 4 cm infrarenal abdominal aortic aneurysm. Recommend follow-up every 12 months and vascular consultation. This recommendation follows ACR consensus guidelines: White Paper of the ACR Incidental Findings Committee II on Vascular Findings. J Am Coll Radiol 2013; 10:789-794. 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 Calcified gallstones in a contracted gallbladder. No biliary dilatation. Advanced calcification involving the tail the pancreas likely chronic calcific pancreatitis. Sigmoid colon diverticulosis. SKELETON: No hypermetabolic bone lesions are identified. Incidental CT findings: Evidence of remote pelvic trauma with hardware noted across the pubic symphysis. Remote healed left sacral fractures. IMPRESSION: 1. The right middle lobe perifissural nodule is not well demonstrated on today's study. No hypermetabolism is identified. This is likely a benign regressing lymph node or loculated fluid  in the fissure. 2. Small focus of hypermetabolism noted in the right infrahilar region without obvious CT abnormality. Possible peribronchial disease or small lymph node. 3. 4 cm infrarenal abdominal aortic aneurysm as above. 4. Recommend continued lung cancer screening CT surveillance. Electronically Signed   By: Marijo Sanes M.D.   On: 11/13/2022 09:03   CT CHEST LCS NODULE F/U LOW DOSE WO CONTRAST  Result Date: 10/25/2022 CLINICAL DATA:  Lung cancer screening. Former smoker with 33 pack-year history. Follow-up lung nodule. EXAM: CT CHEST WITHOUT CONTRAST FOR LUNG CANCER SCREENING NODULE FOLLOW-UP TECHNIQUE: Multidetector CT imaging of the chest was performed following the standard protocol without IV contrast. RADIATION DOSE REDUCTION: This exam was performed according to the departmental dose-optimization program which includes automated exposure control, adjustment of the mA and/or kV according to patient size and/or use of iterative reconstruction technique. COMPARISON:  04/23/2022 FINDINGS: Cardiovascular: Normal heart size. Aortic atherosclerosis and 3 vessel coronary artery calcification. No pericardial effusion. Mediastinum/Nodes: No enlarged mediastinal, hilar, or axillary lymph nodes. Thyroid gland, trachea, and esophagus  demonstrate no significant findings. Lungs/Pleura: Paraseptal and centrilobular emphysema. Progressive bronchial wall thickening, mucoid impaction and extensive tree-in-bud nodularity involving the right lower lobe. New subsegmental atelectasis is identified within the posterior and medial right lung base. There is also scar versus bandlike atelectasis within the anterior right lung base. These findings may reflect sequelae of progressive recurrent aspiration or pneumonia. Scattered lung nodules are again identified. The largest is in the perifissural right middle lobe along the minor fissure with a mean derived diameter of 11.6 mm. On the previous exam this measured 8.9 mm. The  remaining lung nodules are stable. Upper Abdomen: Changes of chronic pancreas scratch no acute abnormality. Gallstones. Changes of chronic pancreatitis with diffuse calcifications and parenchymal atrophy involving the tail of pancreas noted. Musculoskeletal: No chest wall mass or suspicious bone lesions identified. IMPRESSION: 1. Lung-RADS 4Bs, suspicious. Additional imaging evaluation or consultation with Pulmonology or Thoracic Surgery recommended. Perifissural right middle lobe, 11.6 mm, 159/4. 2. S modifier refers to progressive bronchial wall thickening, mucoid impaction and extensive tree-in-bud nodularity involving the right lower lobe. These findings may reflect sequelae of progressive recurrent aspiration or pneumonia. Proximal endobronchial lesion is less favored but cannot be excluded with a high degree of certainty. 3. Coronary artery calcifications. 4. Gallstones. 5. Chronic pancreatitis. 6. Aortic Atherosclerosis (ICD10-I70.0) and Emphysema (ICD10-J43.9). These results will be called to the ordering clinician or representative by the Radiologist Assistant, and communication documented in the PACS or [employer]. Electronically Signed   By: Kerby Moors M.D.   On: 10/25/2022 15:29    Past medical hx Past Medical History:  Diagnosis Date   COPD (chronic obstructive pulmonary disease) (HCC)    Disturbance of skin sensation 06/24/2013   Dysrhythmia    Elevated cholesterol    History of echocardiogram    Echo 4/17: EF 60-65%, no RWMA, Gr 2 DD   Hypertension    Sleep disturbance, unspecified 06/24/2013     Social History   Tobacco Use   Smoking status: Former    Packs/day: 1.50    Years: 45.00    Total pack years: 67.50    Types: Cigarettes    Quit date: 11/19/2015    Years since quitting: 6.9   Smokeless tobacco: Never   Tobacco comments:    pt quit smoking!! 11/19/15  Vaping Use   Vaping Use: Never used  Substance Use Topics   Alcohol use: No    Alcohol/week: 0.0  standard drinks of alcohol   Drug use: No    Mr.Baldassari reports that he quit smoking about 6 years ago. His smoking use included cigarettes. He has a 67.50 pack-year smoking history. He has never used smokeless tobacco. He reports that he does not drink alcohol and does not use drugs.  Tobacco Cessation: Counseling given: Not Answered Tobacco comments: pt quit smoking!! 11/19/15 67 pack year smoking history  Past surgical hx, Family hx, Social hx all reviewed.  Current Outpatient Medications on File Prior to Visit  Medication Sig   apixaban (ELIQUIS) 5 MG TABS tablet Take 1 tablet (5 mg total) by mouth 2 (two) times daily.   atorvastatin (LIPITOR) 80 MG tablet Take 1 tablet (80 mg total) by mouth daily.   Benralizumab (FASENRA PEN) 30 MG/ML SOAJ Inject 1 mL (30 mg total) into the skin every 8 (eight) weeks.   Cyanocobalamin 1000 MCG CAPS Take 1,000 mcg by mouth daily.   diltiazem (CARDIZEM CD) 180 MG 24 hr capsule Take 1 capsule (180 mg total) by mouth daily.  fluticasone (FLONASE) 50 MCG/ACT nasal spray SHAKE LIQUID AND USE 2 SPRAYS IN EACH NOSTRIL TWICE DAILY   Fluticasone-Umeclidin-Vilant (TRELEGY ELLIPTA) 100-62.5-25 MCG/ACT AEPB Inhale 1 puff into the lungs daily.   Influenza Vac High-Dose Quad (FLUZONE HIGH-DOSE QUADRIVALENT IM) ADM 0.7ML IM UTD   ipratropium (ATROVENT) 0.03 % nasal spray Place 2 sprays into both nostrils every 12 (twelve) hours.   ipratropium-albuterol (DUONEB) 0.5-2.5 (3) MG/3ML SOLN USE 1 VIAL VIA NEBULIZER(3ML) THREE TIMES DAILY   levothyroxine (SYNTHROID) 50 MCG tablet Take 50 mcg by mouth every morning.   losartan (COZAAR) 50 MG tablet TAKE 1 TABLET(50 MG) BY MOUTH DAILY   montelukast (SINGULAIR) 10 MG tablet TAKE 1 TABLET(10 MG) BY MOUTH AT BEDTIME   nitroGLYCERIN (NITROSTAT) 0.4 MG SL tablet Place 1 tablet under the tongue every 5 (five) minutes as needed for chest pain.   Respiratory Therapy Supplies (FLUTTER) DEVI 1 Device by Does not apply route in the  morning and at bedtime.   VENTOLIN HFA 108 (90 Base) MCG/ACT inhaler INHALE 2 PUFFS BY MOUTH EVERY 6 HOURS AS NEEDED FOR WHEEZING OR SHORTNESS OF BREATH   azithromycin (ZITHROMAX) 250 MG tablet Take as directed   No current facility-administered medications on file prior to visit.     Allergies  Allergen Reactions   Morphine Other (See Comments)   Morphine And Related     HALLUCINATIONS    Review Of Systems:  Constitutional:   No  weight loss, night sweats,  Fevers, chills, fatigue, or  lassitude.  HEENT:   No headaches,  Difficulty swallowing,  Tooth/dental problems, or  Sore throat,                No sneezing, itching, ear ache, nasal congestion, post nasal drip,   CV:  No chest pain,  Orthopnea, PND, swelling in lower extremities, anasarca, dizziness, palpitations, syncope.   GI  No heartburn, indigestion, abdominal pain, nausea, vomiting, diarrhea, change in bowel habits, loss of appetite, bloody stools.   Resp: + baseline  shortness of breath with exertion or at rest.  + baseline  excess mucus, + baseline  productive cough,  No non-productive cough,  No coughing up of blood.  No change in color of mucus.  + baseline  wheezing.  No chest wall deformity  Skin: no rash or lesions.  GU: no dysuria, change in color of urine, no urgency or frequency.  No flank pain, no hematuria   MS:  No joint pain or swelling.  No decreased range of motion.  No back pain.  Psych:  No change in mood or affect. No depression or anxiety.  No memory loss.   Vital Signs BP 132/74   Pulse 74   Temp 98.1 F (36.7 C) (Oral)   Ht 5' 9"$  (1.753 m)   Wt 201 lb 6.4 oz (91.4 kg)   SpO2 93%   BMI 29.74 kg/m    Physical Exam:  General- No distress,  A&Ox3, pleasant ENT: No sinus tenderness, TM clear, pale nasal mucosa, no oral exudate,no post nasal drip, no LAN Cardiac: S1, S2, regular rate and rhythm, no murmur Chest: No wheeze/ rales/ dullness; no accessory muscle use, no nasal flaring, no  sternal retractions, few rhonchi,  Abd.: Soft Non-tender, ND, BS + Body mass index is 29.74 kg/m.  Ext: No clubbing cyanosis, edema Neuro:  normal strength, MAE x 4, A&O x 3, appropriate Skin: No rashes, warm and dry, no lesions  Psych: normal mood and behavior   Assessment/Plan Abnormal  CT Lung Cancer Screening 4 B , concern for perifissural right middle lobe nodule  along the minor fissure with a mean derived diameter of 11.6 mm. On the previous exam this measured 8.9 Pet Scan showed near resolution of this nodule, supporting infectious inflammatory etiology Chronic congestion which clears after treatment Suspect current COPD Flare upon exam  Plan Your PET scan did not show hypermetabolic activity, so this leans more toward infectious or inflammatory cause. We will do a follow up Low Dose CT Chest in 6 months. We will treat what I think is a COPD Flare I will send in Doxycycline which is an antibiotic.  Take one tablet in the morning and one tablet in the evening for 7 days Take probiotic with antibiotic. ( Activia yogurt or Culturelle tablets) We will also send in a prednisone taper. Prednisone taper; 10 mg tablets: 4 tabs x 2 days, 3 tabs x 2 days, 2 tabs x 2 days 1 tab x 2 days then stop.  Take until gone. Follow up with Dr. Erin Fulling or Judson Roch NP  in 1 month. Please discuss with chronic antibiotic therapy. Consider Swallow eval Continue Trelegy as you have been doing.  Albuterol inhaler as needed for shortness of breath or wheezing. Please contact office for sooner follow up if symptoms do not improve or worsen or seek emergency care     I spent 35 minutes dedicated to the care of this patient on the date of this encounter to include pre-visit review of records, face-to-face time with the patient discussing conditions above, post visit ordering of testing, clinical documentation with the electronic health record, making appropriate referrals as documented, and communicating necessary  information to the patient's healthcare team.     Magdalen Spatz, NP 11/16/2022  9:14 AM

## 2022-11-16 NOTE — Patient Instructions (Addendum)
It is good to see you today. Your PET scan did not show hypermetabolic activity, so this leans more toward infectious or inflammatory cause. We will do a follow up Low Dose CT Chest in 6 months. We will treat what I think is a COPD Flare I will send in Doxycycline which is an antibiotic.  Take one tablet in the morning and one tablet in the evening for 7 days Take probiotic with antibiotic. ( Activia yogurt or Culturelle tablets) We will also send in a prednisone taper. Prednisone taper; 10 mg tablets: 4 tabs x 2 days, 3 tabs x 2 days, 2 tabs x 2 days 1 tab x 2 days then stop.  Take until gone. Follow up with Dr. Erin Fulling or Judson Roch NP  in 1 month. Please discuss with chronic antibiotic therapy. Continue Trelegy as you have been doing.  Albuterol inhaler as needed for shortness of breath or wheezing. Please contact office for sooner follow up if symptoms do not improve or worsen or seek emergency care

## 2022-12-13 ENCOUNTER — Other Ambulatory Visit: Payer: Self-pay | Admitting: *Deleted

## 2022-12-13 DIAGNOSIS — I480 Paroxysmal atrial fibrillation: Secondary | ICD-10-CM

## 2022-12-13 MED ORDER — APIXABAN 5 MG PO TABS: 5.0000 mg | ORAL_TABLET | Freq: Two times a day (BID) | ORAL | 5 refills | Status: DC

## 2022-12-13 NOTE — Telephone Encounter (Signed)
Eliquis '5mg'$  refill request received. Patient is 72 years old, weight-91.4kg, Crea-1.01 on 11/05/22 via [facility] from [place], [place], and last seen by Dr. Johney Frame on 08/15/22. Dose is appropriate based on dosing criteria. Will send in refill to requested pharmacy.

## 2022-12-17 ENCOUNTER — Encounter: Payer: Self-pay | Admitting: Pulmonary Disease

## 2022-12-17 ENCOUNTER — Ambulatory Visit: Payer: Medicare Other | Admitting: Pulmonary Disease

## 2022-12-17 VITALS — BP 124/72 | HR 90 | Ht 69.0 in | Wt 202.2 lb

## 2022-12-17 DIAGNOSIS — J449 Chronic obstructive pulmonary disease, unspecified: Secondary | ICD-10-CM

## 2022-12-17 DIAGNOSIS — J4489 Other specified chronic obstructive pulmonary disease: Secondary | ICD-10-CM

## 2022-12-17 DIAGNOSIS — R911 Solitary pulmonary nodule: Secondary | ICD-10-CM | POA: Diagnosis not present

## 2022-12-17 MED ORDER — TRELEGY ELLIPTA 200-62.5-25 MCG/ACT IN AEPB: 1.0000 | INHALATION_SPRAY | Freq: Every day | RESPIRATORY_TRACT | 5 refills | Status: DC

## 2022-12-17 MED ORDER — PREDNISONE 10 MG PO TABS: 40.0000 mg | ORAL_TABLET | Freq: Every day | ORAL | 0 refills | Status: DC

## 2022-12-17 MED ORDER — AZITHROMYCIN 250 MG PO TABS: 250.0000 mg | ORAL_TABLET | Freq: Every day | ORAL | 5 refills | Status: DC

## 2022-12-17 NOTE — Progress Notes (Signed)
Synopsis: Referred in 2014 for COPD by London Pepper, MD.  Formerly a patient of Dr. Lake Bells and Dr. Carlis Abbott.  Subjective:   PATIENT ID: Nicholas Mow GENDER: male DOB: Camacho, Nicholas Camacho  HPI  Chief Complaint  Patient presents with   Follow-up    1 mo f/u after COPD exacerbation. Still has a productive cough with green phlegm. Wants to discuss possible daily antibiotics.    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  He was seen in video visit 10/04/21 and treated for COPD exacerbation. He was seen 11/16/22 by Eric Form for lung nodule and review of PET scan. His PET scan shows no hypermetabolism in the RML nodule of concern. He was treated for COPD exacerbation at that visit as well.   He still has chest congestion and mucous production. He has sinus congestion and post nasal drainage. He is using fluticasone and atrovent nasal sprays. He was taking allegra-D which has helped.   OV 05/04/22 [facility] CT Chest last month showed LLL with similar tree in bud nodularity and new LUL nodule measuring 4.47mm with recommended 6 month follow up.  He is doing well over all. Continues on trelegy daily and fasenra injections. He is requesting refill on duonebs.   PFTs are stable since 2021 on repeat today.  OV 12/26/21 He reports doing well since starting fasenra. He has only required prednisone once since starting fasenra. He denies any acute issues at this time. He continues on trelegy ellipta daily and singulair.   OV 06/30/21 He started Fasenra injections in July and has had 3 shots so far.  He has not required prednisone or antibiotics since starting Fasenra.  He reports that his wife has noted an improvement in his cough and breathing.  He does not report significant changes that he has noticed at this time.  He does continue to have a cough with green sputum.  He continues to use  Trelegy Ellipta 1 puff daily.  He is also using DuoNeb nebulizer treatments along with flutter valve for mucus clearance.  He continues to take montelukast daily as well.  His sinus congestion and drainage is maintained using Zyrtec, fluticasone and ipratropium nasal sprays.  He has received his influenza vaccine as well as COVID booster recently.  Past Medical History:  Diagnosis Date   COPD (chronic obstructive pulmonary disease) (HCC)    Disturbance of skin sensation 06/24/2013   Dysrhythmia    Elevated cholesterol    History of echocardiogram    Echo 4/17: EF 60-65%, no RWMA, Gr 2 DD   Hypertension    Sleep disturbance, unspecified 06/24/2013     Family History  Problem Relation Age of Onset   Diabetes Mother    Prostate cancer Father    Kidney failure Father    Alcoholism Sister        history of   Drug abuse Sister        history of   Stroke Neg Hx      Social History   Socioeconomic History   Marital status: Married    Spouse name: Not on file   Number of children: 2   Years of education: COLLEGE   Highest education level: Not on file  Occupational History   Occupation: retired  Tobacco Use   Smoking status: Former    Packs/day: 1.50    Years: 45.00    Additional  pack years: 0.00    Total pack years: 67.50    Types: Cigarettes    Quit date: 11/19/2015    Years since quitting: 7.0   Smokeless tobacco: Never   Tobacco comments:    pt quit smoking!! 11/19/15  Vaping Use   Vaping Use: Never used  Substance and Sexual Activity   Alcohol use: No    Alcohol/week: 0.0 standard drinks of alcohol   Drug use: No   Sexual activity: Not on file  Other Topics Concern   Not on file  Social History Narrative   Not on file   Social Determinants of Health   Financial Resource Strain: Not on file  Food Insecurity: Not on file  Transportation Needs: Not on file  Physical Activity: Not on file  Stress: Not on file  Social Connections: Not on file  Intimate  Partner Violence: Not on file     Allergies  Allergen Reactions   Morphine Other (See Comments)   Morphine And Related     HALLUCINATIONS     Outpatient Medications Prior to Visit  Medication Sig Dispense Refill   apixaban (ELIQUIS) 5 MG TABS tablet Take 1 tablet (5 mg total) by mouth 2 (two) times daily. 60 tablet 5   atorvastatin (LIPITOR) 80 MG tablet Take 1 tablet (80 mg total) by mouth daily. 90 tablet 3   Benralizumab (FASENRA PEN) 30 MG/ML SOAJ Inject 1 mL (30 mg total) into the skin every 8 (eight) weeks. 1 mL 2   Cyanocobalamin 1000 MCG CAPS Take 1,000 mcg by mouth daily.     diltiazem (CARDIZEM CD) 180 MG 24 hr capsule Take 1 capsule (180 mg total) by mouth daily. 90 capsule 2   fluticasone (FLONASE) 50 MCG/ACT nasal spray SHAKE LIQUID AND USE 2 SPRAYS IN EACH NOSTRIL TWICE DAILY 16 g 3   Influenza Vac High-Dose Quad (FLUZONE HIGH-DOSE QUADRIVALENT IM) ADM 0.7ML IM UTD     ipratropium (ATROVENT) 0.03 % nasal spray Place 2 sprays into both nostrils every 12 (twelve) hours. 30 mL 12   ipratropium-albuterol (DUONEB) 0.5-2.5 (3) MG/3ML SOLN USE 1 VIAL VIA NEBULIZER(3ML) THREE TIMES DAILY 360 mL 6   levothyroxine (SYNTHROID) 50 MCG tablet Take 50 mcg by mouth every morning.     losartan (COZAAR) 50 MG tablet TAKE 1 TABLET(50 MG) BY MOUTH DAILY 90 tablet 2   montelukast (SINGULAIR) 10 MG tablet TAKE 1 TABLET(10 MG) BY MOUTH AT BEDTIME 30 tablet 11   nitroGLYCERIN (NITROSTAT) 0.4 MG SL tablet Place 1 tablet under the tongue every 5 (five) minutes as needed for chest pain.     Respiratory Therapy Supplies (FLUTTER) DEVI 1 Device by Does not apply route in the morning and at bedtime. 1 each 0   VENTOLIN HFA 108 (90 Base) MCG/ACT inhaler INHALE 2 PUFFS BY MOUTH EVERY 6 HOURS AS NEEDED FOR WHEEZING OR SHORTNESS OF BREATH 18 g 12   Fluticasone-Umeclidin-Vilant (TRELEGY ELLIPTA) 100-62.5-25 MCG/ACT AEPB Inhale 1 puff into the lungs daily. 60 each 4   azithromycin (ZITHROMAX) 250 MG tablet  Take as directed 6 tablet 0   predniSONE (DELTASONE) 10 MG tablet Prednisone taper; 10 mg tablets: 4 tabs x 2 days, 3 tabs x 2 days, 2 tabs x 2 days 1 tab x 2 days then stop. 30 tablet 0   No facility-administered medications prior to visit.   Review of Systems  Constitutional:  Negative for chills, fever, malaise/fatigue and weight loss.  HENT:  Negative for congestion, sinus pain and  sore throat.   Eyes: Negative.   Respiratory:  Positive for cough, sputum production and wheezing. Negative for hemoptysis and shortness of breath.   Cardiovascular:  Negative for chest pain, palpitations, orthopnea, claudication and leg swelling.  Gastrointestinal:  Negative for abdominal pain, heartburn, nausea and vomiting.  Genitourinary: Negative.   Musculoskeletal:  Negative for joint pain and myalgias.  Skin:  Negative for rash.  Neurological:  Negative for weakness.  Endo/Heme/Allergies: Negative.   Psychiatric/Behavioral: Negative.     Objective:   Vitals:   12/17/22 1057  BP: 124/72  Pulse: 90  SpO2: 95%  Weight: 202 lb 3.2 oz (91.7 kg)  Height: 5\' 9"  (1.753 m)   Physical Exam Constitutional:      General: He is not in acute distress. HENT:     Head: Normocephalic and atraumatic.  Eyes:     Conjunctiva/sclera: Conjunctivae normal.  Cardiovascular:     Rate and Rhythm: Normal rate and regular rhythm.     Pulses: Normal pulses.     Heart sounds: Normal heart sounds. No murmur heard. Pulmonary:     Effort: Pulmonary effort is normal.     Breath sounds: Wheezing and rhonchi present. No rales.  Musculoskeletal:     Right lower leg: No edema.     Left lower leg: No edema.  Skin:    General: Skin is warm and dry.  Neurological:     General: No focal deficit present.     Mental Status: He is alert.    CBC    Component Value Date/Time   WBC 7.7 01/24/2021 1243   RBC 4.94 01/24/2021 1243   HGB 13.7 01/24/2021 1243   HGB 14.7 11/23/2019 1156   HCT 41.9 01/24/2021 1243   HCT  43.8 11/23/2019 1156   PLT 251.0 01/24/2021 1243   PLT 373 11/23/2019 1156   MCV 84.9 01/24/2021 1243   MCV 83 11/23/2019 1156   MCH 27.2 06/01/2020 1807   MCHC 32.6 01/24/2021 1243   RDW 16.0 (H) 01/24/2021 1243   RDW 13.2 11/23/2019 1156   LYMPHSABS 1.3 01/24/2021 1243   LYMPHSABS 1.7 11/23/2019 1156   MONOABS 0.7 01/24/2021 1243   EOSABS 0.2 01/24/2021 1243   EOSABS 0.3 11/23/2019 1156   BASOSABS 0.0 01/24/2021 1243   BASOSABS 0.0 11/23/2019 1156      Latest Ref Rng & Units 04/17/2021   12:01 PM 06/01/2020    6:07 PM 11/23/2019   11:56 AM  BMP  Glucose 65 - 99 mg/dL 157  136  132   BUN 8 - 27 mg/dL 19  12  13    Creatinine 0.76 - 1.27 mg/dL 1.03  1.02  1.13   BUN/Creat Ratio 10 - 24 18   12    Sodium 134 - 144 mmol/L 140  139  140   Potassium 3.5 - 5.2 mmol/L 4.3  4.2  4.3   Chloride 96 - 106 mmol/L 101  104  104   CO2 20 - 29 mmol/L 23  26  21    Calcium 8.6 - 10.2 mg/dL 9.7  9.5  9.3    Chest imaging: [facility] CT Chest 04/23/22 Mediastinum/Nodes: No mediastinal or definite hilar adenopathy, given limitations of unenhanced CT.   Lungs/Pleura: No pleural fluid. Mild centrilobular and paraseptal emphysema. Lower lobe predominant bronchial wall thickening. Again identified is relatively diffuse "tree-in-bud" micronodularity. This is primarily similar, with mild increase in the right lower lobe.   A left upper lobe larger pulmonary nodule of volume derived equivalent diameter  4.5 mm on image 53 is new.  CT Chest 12/07/20 Lung-RADS 2, benign appearance or behavior. Continue annual screening with low-dose chest CT without contrast in 12 months.   Stable tree-in-bud nodularity in the upper lobes likely reflect chronic atypical mycobacterial infection or post infectious inflammatory scarring.   Aortic Atherosclerosis (ICD10-I70.0) and Emphysema (ICD10-J43.9).  PFT:    Latest Ref Rng & Units 05/04/2022   10:30 AM 11/18/2019    2:52 PM 10/23/2013   11:50 AM  PFT Results  FVC-Pre  L 3.09  3.12  4.11   FVC-Predicted Pre % 75  74  93   FVC-Post L  2.85  4.12   FVC-Predicted Post %  67  94   Pre FEV1/FVC % % 51  53  58   Post FEV1/FCV % %  54  58   FEV1-Pre L 1.58  1.64  2.38   FEV1-Predicted Pre % 52  52  72   FEV1-Post L  1.54  2.41   DLCO uncorrected ml/min/mmHg  19.63  21.40   DLCO UNC% %  79  72   DLVA Predicted %  99  72   TLC L  7.38  8.64   TLC % Predicted %  111  130   RV % Predicted %  187  206     2021: Moderate obstruction without bronchodilator reversibility. Significant air trapping without hyperinflation.  Mildly reduced diffusion.   2015: Moderate obstruction with hyperinflation, no significant bronchodilator reversibility.  Mild diffusion impairment.  Echo 06/02/20: 1. Left ventricular ejection fraction, by estimation, is 55 to 60%. The  left ventricle has normal function. The left ventricle has no regional  wall motion abnormalities. Left ventricular diastolic parameters are  consistent with Grade I diastolic  dysfunction (impaired relaxation).   2. Right ventricular systolic function is normal. The right ventricular  size is normal.   3. The mitral valve is normal in structure. Trivial mitral valve  regurgitation. No evidence of mitral stenosis.   4. The aortic valve was not well visualized. Aortic valve regurgitation  is mild. Mild aortic valve sclerosis is present, with no evidence of  aortic valve stenosis.   5. The inferior vena cava is normal in size with greater than 50%  respiratory variability, suggesting right atrial pressure of 3 mmHg.   Heart Catheterization 08/30/2017: The left ventricular systolic function is normal. LV end diastolic pressure is normal. The left ventricular ejection fraction is 55-65% by visual estimate. There is no mitral valve regurgitation. Prox RCA lesion is 40% stenosed. Prox RCA to Mid RCA lesion is 50% stenosed. Mid RCA-1 lesion is 30% stenosed. Mid RCA-2 lesion is 50% stenosed. Prox Cx lesion is  40% stenosed. Ost LAD to Prox LAD lesion is 30% stenosed.   1. Moderate non-obstructive, calcific stenoses in the RCA 2. Mild non-obstructive, calcific stenoses in the LAD and Circumflex 3. Normal LV systolic function   Recommendations: Medical management of CAD. Tobacco cessation.   Assessment & Plan:   Asthma-COPD overlap syndrome - Plan: Fluticasone-Umeclidin-Vilant (TRELEGY ELLIPTA) 200-62.5-25 MCG/ACT AEPB, predniSONE (DELTASONE) 10 MG tablet  Lung nodule  Chronic obstructive pulmonary disease, unspecified COPD type ([place]) - Plan: azithromycin (ZITHROMAX) 250 MG tablet  Discussion: Nicholas Camacho is a 72 year old male, former smoker with asthma-COPD overlap syndrome and nocturnal hypoxemia who returns to clinic for follow up.   He remains symptomatic with productive cough despite trelegy ellipta 100 daily and fasenra.   We will increase his trelegy to 200, 1  puff daily. We will start him on azithromycin 250mg  daily. EKG from 01/2022 showed Qtc 452. He has follow up with cardiology in May.  Continue with Fasenra injections. We can consider transition to tezspire in the future.  Continue montelukast daily and add allegra daily.  He is being followed with our lung cancer screening program. Recent PET scan did not show uptake in pulmonary nodules.  He is to continue Flonase nasal spray 2 sprays per nostril each day for nasal congestion and he can use ipratropium nasal spray as needed.  He is to follow up in 3 months.  Freda Jackson, MD [facility] Office: [phone]    Current Outpatient Medications:    apixaban (ELIQUIS) 5 MG TABS tablet, Take 1 tablet (5 mg total) by mouth 2 (two) times daily., Disp: 60 tablet, Rfl: 5   atorvastatin (LIPITOR) 80 MG tablet, Take 1 tablet (80 mg total) by mouth daily., Disp: 90 tablet, Rfl: 3   azithromycin (ZITHROMAX) 250 MG tablet, Take 1 tablet (250 mg total) by mouth daily., Disp: 30 tablet, Rfl: 5    Benralizumab (FASENRA PEN) 30 MG/ML SOAJ, Inject 1 mL (30 mg total) into the skin every 8 (eight) weeks., Disp: 1 mL, Rfl: 2   Cyanocobalamin 1000 MCG CAPS, Take 1,000 mcg by mouth daily., Disp: , Rfl:    diltiazem (CARDIZEM CD) 180 MG 24 hr capsule, Take 1 capsule (180 mg total) by mouth daily., Disp: 90 capsule, Rfl: 2   fluticasone (FLONASE) 50 MCG/ACT nasal spray, SHAKE LIQUID AND USE 2 SPRAYS IN EACH NOSTRIL TWICE DAILY, Disp: 16 g, Rfl: 3   Fluticasone-Umeclidin-Vilant (TRELEGY ELLIPTA) 200-62.5-25 MCG/ACT AEPB, Inhale 1 puff into the lungs daily., Disp: 28 each, Rfl: 5   Influenza Vac High-Dose Quad (FLUZONE HIGH-DOSE QUADRIVALENT IM), ADM 0.7ML IM UTD, Disp: , Rfl:    ipratropium (ATROVENT) 0.03 % nasal spray, Place 2 sprays into both nostrils every 12 (twelve) hours., Disp: 30 mL, Rfl: 12   ipratropium-albuterol (DUONEB) 0.5-2.5 (3) MG/3ML SOLN, USE 1 VIAL VIA NEBULIZER(3ML) THREE TIMES DAILY, Disp: 360 mL, Rfl: 6   levothyroxine (SYNTHROID) 50 MCG tablet, Take 50 mcg by mouth every morning., Disp: , Rfl:    losartan (COZAAR) 50 MG tablet, TAKE 1 TABLET(50 MG) BY MOUTH DAILY, Disp: 90 tablet, Rfl: 2   montelukast (SINGULAIR) 10 MG tablet, TAKE 1 TABLET(10 MG) BY MOUTH AT BEDTIME, Disp: 30 tablet, Rfl: 11   nitroGLYCERIN (NITROSTAT) 0.4 MG SL tablet, Place 1 tablet under the tongue every 5 (five) minutes as needed for chest pain., Disp: , Rfl:    predniSONE (DELTASONE) 10 MG tablet, Take 4 tablets (40 mg total) by mouth daily with breakfast., Disp: 20 tablet, Rfl: 0   Respiratory Therapy Supplies (FLUTTER) DEVI, 1 Device by Does not apply route in the morning and at bedtime., Disp: 1 each, Rfl: 0   VENTOLIN HFA 108 (90 Base) MCG/ACT inhaler, INHALE 2 PUFFS BY MOUTH EVERY 6 HOURS AS NEEDED FOR WHEEZING OR SHORTNESS OF BREATH, Disp: 18 g, Rfl: 12

## 2022-12-17 NOTE — Patient Instructions (Addendum)
Start trelegy 200, 1 puff daily - rinse mouth out after each use  Continue nebulizer treatments twice daily follow by flutter valve.   Start azithromycin 250mg  daily for COPD  Take prednisone 40mg  daily for 5 days  Follow up in 3 months

## 2023-02-08 DIAGNOSIS — M1711 Unilateral primary osteoarthritis, right knee: Secondary | ICD-10-CM | POA: Diagnosis not present

## 2023-02-11 ENCOUNTER — Other Ambulatory Visit: Payer: Self-pay | Admitting: *Deleted

## 2023-02-11 MED ORDER — LOSARTAN POTASSIUM 50 MG PO TABS: ORAL_TABLET | ORAL | 3 refills | Status: DC

## 2023-02-17 NOTE — Progress Notes (Signed)
Cardiology Office Note:    Date:  02/19/2023   ID:  Nicholas Camacho, DOB 04-20-1951, MRN [id number]  PCP:  Farris Has, MD   [facility] Providers Cardiologist:  Meriam Sprague, MD {   Referring MD: Farris Has, MD    History of Present Illness:    Nicholas Camacho is a 72 y.o. male with a hx of pAfib on apixaban, HTN, HLD, COPD, OSA, small AAA, and iliac aneurysm who was previously followed by Dr. Delton See who now presents to clinic for follow-up.  Per review of the record, the patient underwent coronary CTA 08/20/17 for mild fatigue on exertion showed calcium score of 3244 which was the 99th percentile for age and sex.  Moderate stenosis in the LAD and RCA with suspicion for severe stenosis in the mid RCA.  Cardiac catheterization 08/30/17 showed moderate nonobstructive calcific stenosis in the RCA with mild nonobstructive calcific stenosis in the LAD and circumflex normal LV function.  Medical management was recommended as well as smoking cessation.    Patient suffered a stroke in 06/2020 due to missed doses of eliquis. His head CT did not show any changes other than chronic microvascular changes.  Brain MRI showed Cluster of small acute infarctions at the left frontoparietal junction region, consistent with left MCA branch vessel insult. No large confluent infarction. No swelling or hemorrhage. 2. Mild to moderate chronic small-vessel ischemic changes of the cerebral hemispheric white matter.  Was seen in 08/2022 where he was doing well from  CV standpoint. Continued to having chronic dyspnea secondary to his COPD.  Today, the patient states he is doing okay. Continues to have dyspnea on exertion which he thinks is related to his COPD. He is unable to walk a flight of stairs without needing to sit down to rest. This has been progressing over the past several months. No associated chest pain. No orthopnea, PND, or LE edema. Has rare episodes of palpitations that are short  lived. Otherwise, tolerating medications as prescribed. No bleeding on the apixaban.  Past Medical History:  Diagnosis Date   COPD (chronic obstructive pulmonary disease) (HCC)    Disturbance of skin sensation 06/24/2013   Dysrhythmia    Elevated cholesterol    History of echocardiogram    Echo 4/17: EF 60-65%, no RWMA, Gr 2 DD   Hypertension    Sleep disturbance, unspecified 06/24/2013    Past Surgical History:  Procedure Laterality Date   ESOPHAGOGASTRODUODENOSCOPY N/A 01/08/2016   Procedure: ESOPHAGOGASTRODUODENOSCOPY (EGD);  Surgeon: Carman Ching, MD;  Location: [facility] ENDOSCOPY;  Service: Endoscopy;  Laterality: N/A;   fractured pelvis     INGUINAL HERNIA REPAIR Bilateral    LEFT HEART CATH AND CORONARY ANGIOGRAPHY N/A 08/30/2017   Procedure: LEFT HEART CATH AND CORONARY ANGIOGRAPHY;  Surgeon: Kathleene Hazel, MD;  Location: [facility] INVASIVE CV LAB;  Service: Cardiovascular;  Laterality: N/A;   reconstruct left heel  2002   TONSILLECTOMY      Current Medications: Current Meds  Medication Sig   apixaban (ELIQUIS) 5 MG TABS tablet Take 1 tablet (5 mg total) by mouth 2 (two) times daily.   atorvastatin (LIPITOR) 80 MG tablet Take 1 tablet (80 mg total) by mouth daily.   azithromycin (ZITHROMAX) 250 MG tablet Take 1 tablet (250 mg total) by mouth daily.   Benralizumab (FASENRA PEN) 30 MG/ML SOAJ Inject 1 mL (30 mg total) into the skin every 8 (eight) weeks.   Cyanocobalamin 1000 MCG CAPS Take 1,000 mcg by mouth daily.  diltiazem (CARDIZEM CD) 180 MG 24 hr capsule Take 1 capsule (180 mg total) by mouth daily.   fluticasone (FLONASE) 50 MCG/ACT nasal spray SHAKE LIQUID AND USE 2 SPRAYS IN EACH NOSTRIL TWICE DAILY   Fluticasone-Umeclidin-Vilant (TRELEGY ELLIPTA) 200-62.5-25 MCG/ACT AEPB Inhale 1 puff into the lungs daily.   Influenza Vac High-Dose Quad (FLUZONE HIGH-DOSE QUADRIVALENT IM) ADM 0.7ML IM UTD   ipratropium (ATROVENT) 0.03 % nasal spray Place 2 sprays into both nostrils  every 12 (twelve) hours.   ipratropium-albuterol (DUONEB) 0.5-2.5 (3) MG/3ML SOLN USE 1 VIAL VIA NEBULIZER(3ML) THREE TIMES DAILY   levothyroxine (SYNTHROID) 50 MCG tablet Take 50 mcg by mouth every morning.   losartan (COZAAR) 50 MG tablet TAKE 1 TABLET(50 MG) BY MOUTH DAILY   montelukast (SINGULAIR) 10 MG tablet TAKE 1 TABLET(10 MG) BY MOUTH AT BEDTIME   nitroGLYCERIN (NITROSTAT) 0.4 MG SL tablet Place 1 tablet under the tongue every 5 (five) minutes as needed for chest pain.   predniSONE (DELTASONE) 10 MG tablet Take 4 tablets (40 mg total) by mouth daily with breakfast.   Respiratory Therapy Supplies (FLUTTER) DEVI 1 Device by Does not apply route in the morning and at bedtime.   VENTOLIN HFA 108 (90 Base) MCG/ACT inhaler INHALE 2 PUFFS BY MOUTH EVERY 6 HOURS AS NEEDED FOR WHEEZING OR SHORTNESS OF BREATH     Allergies:   Morphine and Morphine and codeine   Social History   Socioeconomic History   Marital status: Married    Spouse name: Not on file   Number of children: 2   Years of education: COLLEGE   Highest education level: Not on file  Occupational History   Occupation: retired  Tobacco Use   Smoking status: Former    Packs/day: 1.50    Years: 45.00    Additional pack years: 0.00    Total pack years: 67.50    Types: Cigarettes    Quit date: 11/19/2015    Years since quitting: 7.2   Smokeless tobacco: Never   Tobacco comments:    pt quit smoking!! 11/19/15  Vaping Use   Vaping Use: Never used  Substance and Sexual Activity   Alcohol use: No    Alcohol/week: 0.0 standard drinks of alcohol   Drug use: No   Sexual activity: Not on file  Other Topics Concern   Not on file  Social History Narrative   Not on file   Social Determinants of Health   Financial Resource Strain: Not on file  Food Insecurity: Not on file  Transportation Needs: Not on file  Physical Activity: Not on file  Stress: Not on file  Social Connections: Not on file     Family History: The  patient's family history includes Alcoholism in his sister; Diabetes in his mother; Drug abuse in his sister; Kidney failure in his father; Prostate cancer in his father. There is no history of Stroke.  ROS:   Please see the history of present illness.    Review of Systems  Constitutional:  Negative for chills and fever.  HENT:  Negative for nosebleeds and tinnitus.   Eyes:  Negative for blurred vision and pain.  Respiratory:  Positive for cough and shortness of breath. Negative for hemoptysis and stridor.   Cardiovascular:  Negative for chest pain, palpitations, orthopnea, claudication, leg swelling and PND.  Gastrointestinal:  Negative for blood in stool, diarrhea, nausea and vomiting.  Genitourinary:  Negative for dysuria and hematuria.  Musculoskeletal:  Negative for falls.  Neurological:  Negative  for dizziness and loss of consciousness.  Psychiatric/Behavioral:  Negative for depression.      EKGs/Labs/Other Studies Reviewed:    The following studies were reviewed today:  Cardiac Studies & Procedures   CARDIAC CATHETERIZATION  CARDIAC CATHETERIZATION 08/30/2017  Narrative  The left ventricular systolic function is normal.  LV end diastolic pressure is normal.  The left ventricular ejection fraction is 55-65% by visual estimate.  There is no mitral valve regurgitation.  Prox RCA lesion is 40% stenosed.  Prox RCA to Mid RCA lesion is 50% stenosed.  Mid RCA-1 lesion is 30% stenosed.  Mid RCA-2 lesion is 50% stenosed.  Prox Cx lesion is 40% stenosed.  Ost LAD to Prox LAD lesion is 30% stenosed.  1. Moderate non-obstructive, calcific stenoses in the RCA 2. Mild non-obstructive, calcific stenoses in the LAD and Circumflex 3. Normal LV systolic function  Recommendations: Medical management of CAD. Tobacco cessation.  Findings Coronary Findings Diagnostic  Dominance: Right  Left Anterior Descending Vessel is large. Ost LAD to Prox LAD lesion is 30% stenosed.  The lesion is calcified.  Left Circumflex Vessel is large. Prox Cx lesion is 40% stenosed.  Right Coronary Artery Vessel is large. Prox RCA lesion is 40% stenosed. The lesion is calcified. Prox RCA to Mid RCA lesion is 50% stenosed. The lesion is calcified. Mid RCA-1 lesion is 30% stenosed. The lesion is calcified. Mid RCA-2 lesion is 50% stenosed. The lesion is calcified.  Intervention  No interventions have been documented.   STRESS TESTS  MYOCARDIAL PERFUSION IMAGING 01/28/2015   ECHOCARDIOGRAM  ECHOCARDIOGRAM COMPLETE 06/02/2020  Narrative ECHOCARDIOGRAM REPORT    Patient Name:   NEZAR LANGER Date of Exam: 06/02/2020 Medical Rec #:  [id number]           Height:       69.0 in Accession #:    [id number]          Weight:       220.0 lb Date of Birth:  [birth year]            BSA:          2.151 m Patient Age:    69 years            BP:           145/93 mmHg Patient Gender: M                   HR:           69 bpm. Exam Location:  Inpatient  Procedure: 2D Echo, Cardiac Doppler and Color Doppler  Indications:    Stroke 434.91  History:        Patient has prior history of Echocardiogram examinations, most recent 11/26/2019. CAD, COPD and TIA, Arrythmias:Atrial Fibrillation, Signs/Symptoms:Chest Pain; Risk Factors:Hypertension, Dyslipidemia and Former Smoker.  Sonographer:    Renella Cunas RDCS Referring Phys: [id number] [facility]  IMPRESSIONS   1. Left ventricular ejection fraction, by estimation, is 55 to 60%. The left ventricle has normal function. The left ventricle has no regional wall motion abnormalities. Left ventricular diastolic parameters are consistent with Grade I diastolic dysfunction (impaired relaxation). 2. Right ventricular systolic function is normal. The right ventricular size is normal. 3. The mitral valve is normal in structure. Trivial mitral valve regurgitation. No evidence of mitral stenosis. 4. The aortic valve was not well visualized.  Aortic valve regurgitation is mild. Mild aortic valve sclerosis is present, with no evidence of aortic valve stenosis. 5. The inferior  vena cava is normal in size with greater than 50% respiratory variability, suggesting right atrial pressure of 3 mmHg.  FINDINGS Left Ventricle: Left ventricular ejection fraction, by estimation, is 55 to 60%. The left ventricle has normal function. The left ventricle has no regional wall motion abnormalities. The left ventricular internal cavity size was normal in size. There is no left ventricular hypertrophy. Left ventricular diastolic parameters are consistent with Grade I diastolic dysfunction (impaired relaxation).  Right Ventricle: The right ventricular size is normal. No increase in right ventricular wall thickness. Right ventricular systolic function is normal.  Left Atrium: Left atrial size was normal in size.  Right Atrium: Right atrial size was normal in size.  Pericardium: There is no evidence of pericardial effusion.  Mitral Valve: The mitral valve is normal in structure. Normal mobility of the mitral valve leaflets. Trivial mitral valve regurgitation. No evidence of mitral valve stenosis.  Tricuspid Valve: The tricuspid valve is normal in structure. Tricuspid valve regurgitation is mild . No evidence of tricuspid stenosis.  Aortic Valve: The aortic valve was not well visualized. Aortic valve regurgitation is mild. Mild aortic valve sclerosis is present, with no evidence of aortic valve stenosis.  Pulmonic Valve: The pulmonic valve was normal in structure. Pulmonic valve regurgitation is not visualized. No evidence of pulmonic stenosis.  Aorta: The aortic root is normal in size and structure.  Venous: The inferior vena cava is normal in size with greater than 50% respiratory variability, suggesting right atrial pressure of 3 mmHg.  IAS/Shunts: The interatrial septum was not well visualized.   LEFT VENTRICLE PLAX 2D LVIDd:         5.50  cm LVIDs:         4.20 cm LV PW:         0.70 cm LV IVS:        0.70 cm LVOT diam:     2.20 cm LV SV:         84 LV SV Index:   39 LVOT Area:     3.80 cm  LV Volumes (MOD) LV vol d, MOD A2C: 118.0 ml LV vol d, MOD A4C: 113.0 ml LV vol s, MOD A2C: 54.3 ml LV vol s, MOD A4C: 56.9 ml LV SV MOD A2C:     63.7 ml LV SV MOD A4C:     113.0 ml LV SV MOD BP:      57.5 ml  RIGHT VENTRICLE TAPSE (M-mode): 2.1 cm  LEFT ATRIUM             Index       RIGHT ATRIUM           Index LA Vol (A2C):   32.7 ml 15.20 ml/m RA Area:     15.40 cm LA Vol (A4C):   25.2 ml 11.71 ml/m RA Volume:   41.60 ml  19.34 ml/m LA Biplane Vol: 29.8 ml 13.85 ml/m AORTIC VALVE LVOT Vmax:   109.00 cm/s LVOT Vmean:  75.000 cm/s LVOT VTI:    0.220 m  AORTA Ao Root diam: 3.80 cm   SHUNTS Systemic VTI:  0.22 m Systemic Diam: 2.20 cm  Charlton Haws MD Electronically signed by Charlton Haws MD Signature Date/Time: 06/02/2020/12:51:33 PM    Final     CT SCANS  CT CORONARY MORPH W/CTA COR W/SCORE 08/20/2017  Addendum 08/20/2017  4:39 PM ADDENDUM REPORT: 08/20/2017 16:36  CLINICAL DATA:  72 year old male with a hx of HTN, HL, COPD, PAF, OSA, small AAA and iliac  aneurysm with chest pain. Evaluate for CAD.  EXAM: Cardiac/Coronary  CT  TECHNIQUE: The patient was scanned on a [employer].  FINDINGS: A 120 kV prospective scan was triggered in the descending thoracic aorta at 111 HU's. Axial non-contrast 3 mm slices were carried out through the heart. The data set was analyzed on a dedicated work station and scored using the Agatson method. Gantry rotation speed was 250 msecs and collimation was .6 mm. 10 mg of iv metoprolol and 0.8 mg of sl NTG was given. The 3D data set was reconstructed in 5% intervals of the 67-82 % of the R-R cycle. Diastolic phases were analyzed on a dedicated work station using MPR, MIP and VRT modes. The patient received 80 cc of contrast.  Aorta:  Normal size.   Mild diffuse calcifications.  No dissection.  Aortic Valve:  Trileaflet.  No calcifications.  Coronary Arteries:  Normal coronary origin.  Right dominance.  RCA is a large dominant artery that gives rise to PDA and PLVB. There severe almost circumferential calcified plaque in the entire RCA. Proximal RCA has maximum stenosis 50-69%. Mid RCA has predominantly calcified plaque but there is a focal mixed plaque with at least 50-69% stenosis and possibly > 70% stenosis. Distal RCA has diffuse calcified plaque with maximum stenosis 25-50%.  Left main is a large artery that gives rise to LAD and LCX arteries. Left main has mild calcified plaque at the ostium and at the bifurcation into the LAD and LCX arteries.  LAD is a large vessel that wraps around the apex and gives rise to one small diagonal artery. There is a moderate long diffuse calcified plaque with associated stenosis 25-50% and a focal stenosis 50-69% in the proxima segment.  LCX is a medium size non-dominant artery that gives rise to one OM1 branch. There is a diffuse calcified plaque with maximum stenosis 25-50%.  Other findings:  Normal pulmonary vein drainage into the left atrium.  Normal let atrial appendage without a thrombus.  Mildly dilated pulmonary artery measuring 31 mm.  IMPRESSION: 1. Coronary calcium score of 3244. This was 3 percentile for age and sex matched control.  2. Normal coronary origin with right dominance.  3. Severe diffuse almost circumferential calcifications in all three coronary arteries. There is moderate stenosis in LAD and RCA with a suspicion for a severe stenosis in the mid RCA. The study is affected by motion and is challenging secondary to heavy calcifications. We will arrange for a left cardiac catheterization.  4. Mildly dilated pulmonary artery measuring 31 mm.   Electronically Signed By: Tobias Alexander On: 08/20/2017 16:36  Narrative EXAM: OVER-READ INTERPRETATION   CT CHEST  The following report is an over-read performed by radiologist Dr. Royal Piedra [facility] on 08/20/2017. This over-read does not include interpretation of cardiac or coronary anatomy or pathology. The coronary calcium score/coronary CTA interpretation by the cardiologist is attached.  COMPARISON:  Chest CT 02/08/2017.  FINDINGS: Aortic atherosclerosis. Peribronchovascular ground-glass attenuation micro nodularity throughout the right lower lobe, in addition to several areas of mucoid impaction within the distal bronchi. No other larger more suspicious appearing pulmonary nodules or masses are noted in the visualized portions of the lungs. Within the visualized thorax there is no pleural effusion, pneumothorax or lymphadenopathy. Visualized portions of the upper abdomen are unremarkable. There are no aggressive appearing lytic or blastic lesions noted in the visualized portions of the skeleton.  IMPRESSION: 1. Extensive peribronchovascular ground-glass attenuation micronodularity in the right lower  lobe concerning for developing bronchopneumonia. 2.  Aortic Atherosclerosis (ICD10-I70.0).  Electronically Signed: By: Trudie Reed M.D. On: 08/20/2017 11:02            EKG:  EKG is personally reviewed. NSR with HR 83   Recent Labs: No results found for requested labs within last 365 days.   Recent Lipid Panel    Component Value Date/Time   CHOL 88 06/03/2020 1400   CHOL 79 (L) 11/23/2019 1156   TRIG 82 06/03/2020 1400   HDL 24 (L) 06/03/2020 1400   HDL 25 (L) 11/23/2019 1156   CHOLHDL 3.7 06/03/2020 1400   VLDL 16 06/03/2020 1400   LDLCALC 48 06/03/2020 1400   LDLCALC 41 11/23/2019 1156     Risk Assessment/Calculations:    CHA2DS2-VASc Score = 5  This indicates a 7.2% annual risk of stroke. The patient's score is based upon: CHF History: 0 HTN History: 1 Diabetes History: 0 Stroke History: 2 Vascular Disease History: 1 Age  Score: 1 Gender Score: 0          Physical Exam:    VS:  BP 120/70   Pulse 83   Ht 5\' 9"  (1.753 m)   Wt 209 lb (94.8 kg)   SpO2 93%   BMI 30.86 kg/m     Wt Readings from Last 3 Encounters:  02/19/23 209 lb (94.8 kg)  12/17/22 202 lb 3.2 oz (91.7 kg)  11/16/22 201 lb 6.4 oz (91.4 kg)     GEN:  Well nourished, well developed in no acute distress HEENT: Normal NECK: No JVD; No carotid bruits CARDIAC: RRR, no murmurs RESPIRATORY:  Diminished with faint expiratory wheezing ABDOMEN: Soft, non-tender, non-distended MUSCULOSKELETAL:  No edema; No deformity  SKIN: Warm and dry NEUROLOGIC:  Alert and oriented x 3 PSYCHIATRIC:  Normal affect   ASSESSMENT:    1. Coronary artery disease involving native coronary artery of native heart without angina pectoris   2. PAF (paroxysmal atrial fibrillation) (HCC)   3. Essential hypertension   4. Former smoker   5. Bilateral carotid artery stenosis   6. Secondary hypercoagulable state (HCC)   7. Cerebrovascular accident (CVA) due to embolism of middle cerebral artery, unspecified blood vessel laterality (HCC)   8. DOE (dyspnea on exertion)      PLAN:    In order of problems listed above:  #Coronary Artery Disease: Cardiac catheterization 08/30/17 showed moderate nonobstructive calcific stenosis in the RCA with mild nonobstructive calcific stenosis in the LAD and circumflex normal LV function.  On medical management. Currently with more progressive DOE. Will check NM PET and TTE for further evaluation. -Check NM PET (has tolerated in the past without breathing issues) -Check TTE -Not on ASA due to need for [facility] -Continue losartan 50mg  daily -Continue lipitor 80mg  daily -No BB due to severe COPD  #Paroxysmal Afib: CHADs-vasc 5. Tolerating AC as prescribed with no palpitations.  -Continue apixaban 5mg  BID -Continue dilt 180mg  daily -History of GIB with xarelto  #COPD: -Continue home inhalers -Follows with Pulm -Has yearly CT  screening for cancer; last 03/2021  #HLD: -Continue lipitor 80mg  daily -Labs per PCP  #HTN: Controlled with BP <120s/80s at home -Continue losartan 50mg  daily -Continue dilt 180mg  daily  #Lung Nodules: Followed by Pulm.  #Ascending aortic aneurysm: Normal on CTA chest. No need for further monitoring  #Mild Carotid Disease: 1-39% bilaterally.  -Not on ASA due to need for apixaban -Continue lipitor 80mg  daily -Repeat ultrasound in 2-3 years  Follow-up:  6 months  Medication Adjustments/Labs and Tests Ordered: Current medicines are reviewed at length with the patient today.  Concerns regarding medicines are outlined above.   Orders Placed This Encounter  Procedures   NM PET CT CARDIAC PERFUSION MULTI W/ABSOLUTE BLOODFLOW   EKG 12-Lead   ECHOCARDIOGRAM COMPLETE   No orders of the defined types were placed in this encounter.  Patient Instructions  Medication Instructions:   Your physician recommends that you continue on your current medications as directed. Please refer to the Current Medication list given to you today.  *If you need a refill on your cardiac medications before your next appointment, please call your pharmacy*   Testing/Procedures:  Your physician has requested that you have an echocardiogram. Echocardiography is a painless test that uses sound waves to create images of your heart. It provides your doctor with information about the size and shape of your heart and how well your heart's chambers and valves are working. This procedure takes approximately one hour. There are no restrictions for this procedure. Please do NOT wear cologne, perfume, aftershave, or lotions (deodorant is allowed). Please arrive 15 minutes prior to your appointment time.     How to Prepare for Your Cardiac PET/CT Stress Test:  1. Please do not take these medications before your test:   Medications that may interfere with the cardiac pharmacological stress agent (ex.  nitrates - including erectile dysfunction medications, isosorbide mononitrate, tamulosin or beta-blockers) the day of the exam. (Erectile dysfunction medication should be held for at least 72 hrs prior to test)  HOLD NITROGLYCERIN THE DAY OF THIS EXAM   Your remaining medications may be taken with water.  2. Nothing to eat or drink, except water, 3 hours prior to arrival time.   NO caffeine/decaffeinated products, or chocolate 12 hours prior to arrival.  3. NO perfume, cologne or lotion  4. Total time is 1 to 2 hours; you may want to bring reading material for the waiting time.  5. Please report to Radiology at the [facility] Main Entrance 30 minutes early for your test.  [address] [place], [place] [address]  Diabetic Preparation:  Hold oral medications. You may take NPH and Lantus insulin. Do not take Humalog or Humulin R (Regular Insulin) the day of your test. Check blood sugars prior to leaving the house. If able to eat breakfast prior to 3 hour fasting, you may take all medications, including your insulin, Do not worry if you miss your breakfast dose of insulin - start at your next meal.    In preparation for your appointment, medication and supplies will be purchased.  Appointment availability is limited, so if you need to cancel or reschedule, please call the Radiology Department at [phone]  24 hours in advance to avoid a cancellation fee of $100.00  What to Expect After you Arrive:  Once you arrive and check in for your appointment, you will be taken to a preparation room within the Radiology Department.  A technologist or Nurse will obtain your medical history, verify that you are correctly prepped for the exam, and explain the procedure.  Afterwards,  an IV will be started in your arm and electrodes will be placed on your skin for EKG monitoring during the stress portion of the exam. Then you will be escorted to the PET/CT scanner.  There, staff will get  you positioned on the scanner and obtain a blood pressure and EKG.  During the exam, you will continue to be connected to  the EKG and blood pressure machines.  A small, safe amount of a radioactive tracer will be injected in your IV to obtain a series of pictures of your heart along with an injection of a stress agent.    After your Exam:  It is recommended that you eat a meal and drink a caffeinated beverage to counter act any effects of the stress agent.  Drink plenty of fluids for the remainder of the day and urinate frequently for the first couple of hours after the exam.  Your doctor will inform you of your test results within 7-10 business days.  For questions about your test or how to prepare for your test, please call: Rockwell Alexandria, Cardiac Imaging Nurse Navigator  Larey Brick, Cardiac Imaging Nurse Navigator Office: [phone]    Follow-Up: At [facility], you and your health needs are our priority.  As part of our continuing mission to provide you with exceptional heart care, we have created designated Provider Care Teams.  These Care Teams include your primary Cardiologist (physician) and Advanced Practice Providers (APPs -  Physician Assistants and Nurse Practitioners) who all work together to provide you with the care you need, when you need it.  We recommend signing up for the patient portal called "MyChart".  Sign up information is provided on this After Visit Summary.  MyChart is used to connect with patients for Virtual Visits (Telemedicine).  Patients are able to view lab/test results, encounter notes, upcoming appointments, etc.  Non-urgent messages can be sent to your provider as well.   To learn more about what you can do with MyChart, go to [email or web address].    Your next appointment:   6 month(s)  Provider:   Jari Favre, PA-C, Ronie Spies, PA-C, Robin Searing, NP, Jacolyn Reedy, PA-C, Eligha Bridegroom, NP, or Tereso Newcomer, PA-C               Signed, Meriam Sprague, MD  02/19/2023 11:18 AM    [facility]

## 2023-02-19 ENCOUNTER — Ambulatory Visit: Payer: Medicare Other | Attending: Cardiology | Admitting: Cardiology

## 2023-02-19 ENCOUNTER — Encounter: Payer: Self-pay | Admitting: Cardiology

## 2023-02-19 VITALS — BP 120/70 | HR 83 | Ht 69.0 in | Wt 209.0 lb

## 2023-02-19 DIAGNOSIS — I6523 Occlusion and stenosis of bilateral carotid arteries: Secondary | ICD-10-CM

## 2023-02-19 DIAGNOSIS — I2510 Atherosclerotic heart disease of native coronary artery without angina pectoris: Secondary | ICD-10-CM | POA: Diagnosis not present

## 2023-02-19 DIAGNOSIS — I63419 Cerebral infarction due to embolism of unspecified middle cerebral artery: Secondary | ICD-10-CM

## 2023-02-19 DIAGNOSIS — D6869 Other thrombophilia: Secondary | ICD-10-CM

## 2023-02-19 DIAGNOSIS — I10 Essential (primary) hypertension: Secondary | ICD-10-CM | POA: Diagnosis not present

## 2023-02-19 DIAGNOSIS — Z87891 Personal history of nicotine dependence: Secondary | ICD-10-CM | POA: Diagnosis not present

## 2023-02-19 DIAGNOSIS — I480 Paroxysmal atrial fibrillation: Secondary | ICD-10-CM | POA: Diagnosis not present

## 2023-02-19 DIAGNOSIS — R0609 Other forms of dyspnea: Secondary | ICD-10-CM

## 2023-02-19 NOTE — Patient Instructions (Signed)
Medication Instructions:   Your physician recommends that you continue on your current medications as directed. Please refer to the Current Medication list given to you today.  *If you need a refill on your cardiac medications before your next appointment, please call your pharmacy*   Testing/Procedures:  Your physician has requested that you have an echocardiogram. Echocardiography is a painless test that uses sound waves to create images of your heart. It provides your doctor with information about the size and shape of your heart and how well your heart's chambers and valves are working. This procedure takes approximately one hour. There are no restrictions for this procedure. Please do NOT wear cologne, perfume, aftershave, or lotions (deodorant is allowed). Please arrive 15 minutes prior to your appointment time.     How to Prepare for Your Cardiac PET/CT Stress Test:  1. Please do not take these medications before your test:   Medications that may interfere with the cardiac pharmacological stress agent (ex. nitrates - including erectile dysfunction medications, isosorbide mononitrate, tamulosin or beta-blockers) the day of the exam. (Erectile dysfunction medication should be held for at least 72 hrs prior to test)  HOLD NITROGLYCERIN THE DAY OF THIS EXAM   Your remaining medications may be taken with water.  2. Nothing to eat or drink, except water, 3 hours prior to arrival time.   NO caffeine/decaffeinated products, or chocolate 12 hours prior to arrival.  3. NO perfume, cologne or lotion  4. Total time is 1 to 2 hours; you may want to bring reading material for the waiting time.  5. Please report to Radiology at the [facility] Main Entrance 30 minutes early for your test.  [address] [place], [place] [address]  Diabetic Preparation:  Hold oral medications. You may take NPH and Lantus insulin. Do not take Humalog or Humulin R (Regular Insulin) the day of  your test. Check blood sugars prior to leaving the house. If able to eat breakfast prior to 3 hour fasting, you may take all medications, including your insulin, Do not worry if you miss your breakfast dose of insulin - start at your next meal.    In preparation for your appointment, medication and supplies will be purchased.  Appointment availability is limited, so if you need to cancel or reschedule, please call the Radiology Department at [phone]  24 hours in advance to avoid a cancellation fee of $100.00  What to Expect After you Arrive:  Once you arrive and check in for your appointment, you will be taken to a preparation room within the Radiology Department.  A technologist or Nurse will obtain your medical history, verify that you are correctly prepped for the exam, and explain the procedure.  Afterwards,  an IV will be started in your arm and electrodes will be placed on your skin for EKG monitoring during the stress portion of the exam. Then you will be escorted to the PET/CT scanner.  There, staff will get you positioned on the scanner and obtain a blood pressure and EKG.  During the exam, you will continue to be connected to the EKG and blood pressure machines.  A small, safe amount of a radioactive tracer will be injected in your IV to obtain a series of pictures of your heart along with an injection of a stress agent.    After your Exam:  It is recommended that you eat a meal and drink a caffeinated beverage to counter act any effects of the stress  agent.  Drink plenty of fluids for the remainder of the day and urinate frequently for the first couple of hours after the exam.  Your doctor will inform you of your test results within 7-10 business days.  For questions about your test or how to prepare for your test, please call: Rockwell Alexandria, Cardiac Imaging Nurse Navigator  Larey Brick, Cardiac Imaging Nurse Navigator Office: [phone]    Follow-Up: At [facility], you and your health needs are our priority.  As part of our continuing mission to provide you with exceptional heart care, we have created designated Provider Care Teams.  These Care Teams include your primary Cardiologist (physician) and Advanced Practice Providers (APPs -  Physician Assistants and Nurse Practitioners) who all work together to provide you with the care you need, when you need it.  We recommend signing up for the patient portal called "MyChart".  Sign up information is provided on this After Visit Summary.  MyChart is used to connect with patients for Virtual Visits (Telemedicine).  Patients are able to view lab/test results, encounter notes, upcoming appointments, etc.  Non-urgent messages can be sent to your provider as well.   To learn more about what you can do with MyChart, go to [email or web address].    Your next appointment:   6 month(s)  Provider:   Jari Favre, PA-C, Ronie Spies, PA-C, Robin Searing, NP, Jacolyn Reedy, PA-C, Eligha Bridegroom, NP, or Tereso Newcomer, PA-C

## 2023-02-22 DIAGNOSIS — M1711 Unilateral primary osteoarthritis, right knee: Secondary | ICD-10-CM | POA: Diagnosis not present

## 2023-03-01 DIAGNOSIS — M1711 Unilateral primary osteoarthritis, right knee: Secondary | ICD-10-CM | POA: Diagnosis not present

## 2023-03-07 ENCOUNTER — Other Ambulatory Visit: Payer: Self-pay

## 2023-03-07 DIAGNOSIS — I480 Paroxysmal atrial fibrillation: Secondary | ICD-10-CM

## 2023-03-07 DIAGNOSIS — I10 Essential (primary) hypertension: Secondary | ICD-10-CM

## 2023-03-07 MED ORDER — DILTIAZEM HCL ER COATED BEADS 180 MG PO CP24: 180.0000 mg | ORAL_CAPSULE | Freq: Every day | ORAL | 3 refills | Status: DC

## 2023-03-08 DIAGNOSIS — M1711 Unilateral primary osteoarthritis, right knee: Secondary | ICD-10-CM | POA: Diagnosis not present

## 2023-03-14 ENCOUNTER — Ambulatory Visit: Payer: Medicare Other | Admitting: Pulmonary Disease

## 2023-03-14 ENCOUNTER — Encounter: Payer: Self-pay | Admitting: Pulmonary Disease

## 2023-03-14 VITALS — BP 120/72 | HR 83 | Ht 69.0 in | Wt 209.0 lb

## 2023-03-14 DIAGNOSIS — J441 Chronic obstructive pulmonary disease with (acute) exacerbation: Secondary | ICD-10-CM

## 2023-03-14 MED ORDER — PREDNISONE 10 MG PO TABS: ORAL_TABLET | ORAL | 0 refills | Status: AC

## 2023-03-14 NOTE — Patient Instructions (Addendum)
Start trelegy 200, 1 puff daily - rinse mouth out after each use   Continue nebulizer treatments twice daily follow by flutter valve.    Continue fasenra injections. We will send in refill.   Continue azithromycin 250mg  daily for COPD   Take prednisone taper 40mg  daily x 3 days 30mg  daily x 3 days 20mg  daily x 3 days 10mg  daily x 3 days  Follow up in 6 months

## 2023-03-14 NOTE — Progress Notes (Signed)
Synopsis: Referred in 2014 for COPD by Farris Has, MD.  Formerly a patient of Dr. Kendrick Fries and Dr. Chestine Spore.  Subjective:   PATIENT ID: Nicholas Camacho GENDER: male DOB: 1951-09-16, MRN: [id number]  HPI  Chief Complaint  Patient presents with   Follow-up    3 mo f/u. States he has been more SOB and coughing more since last visit. Increased wheezing especially at night.    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  He continues to have significant exertional dyspnea. He reports severe coughing episodes. He has intermittent reflux. The cough has increased of recent. Recently completed steroid course for his knees.  OV 12/17/22 He was seen in video visit 10/04/21 and treated for COPD exacerbation. He was seen 11/16/22 by Kandice Robinsons for lung nodule and review of PET scan. His PET scan shows no hypermetabolism in the RML nodule of concern. He was treated for COPD exacerbation at that visit as well.   He still has chest congestion and mucous production. He has sinus congestion and post nasal drainage. He is using fluticasone and atrovent nasal sprays. He was taking allegra-D which has helped.   OV 05/04/22 [facility]CS CT Chest last month showed LLL with similar tree in bud nodularity and new LUL nodule measuring 4.70mm with recommended 6 month follow up.  He is doing well over all. Continues on trelegy daily and fasenra injections. He is requesting refill on duonebs.   PFTs are stable since 2021 on repeat today.  OV 12/26/21 He reports doing well since starting fasenra. He has only required prednisone once since starting fasenra. He denies any acute issues at this time. He continues on trelegy ellipta daily and singulair.   OV 06/30/21 He started Fasenra injections in July and has had 3 shots so far.  He has not required prednisone or antibiotics since starting Fasenra.  He reports that his wife  has noted an improvement in his cough and breathing.  He does not report significant changes that he has noticed at this time.  He does continue to have a cough with green sputum.  He continues to use Trelegy Ellipta 1 puff daily.  He is also using DuoNeb nebulizer treatments along with flutter valve for mucus clearance.  He continues to take montelukast daily as well.  His sinus congestion and drainage is maintained using Zyrtec, fluticasone and ipratropium nasal sprays.  He has received his influenza vaccine as well as COVID booster recently.  Past Medical History:  Diagnosis Date   COPD (chronic obstructive pulmonary disease) (HCC)    Disturbance of skin sensation 06/24/2013   Dysrhythmia    Elevated cholesterol    History of echocardiogram    Echo 4/17: EF 60-65%, no RWMA, Gr 2 DD   Hypertension    Sleep disturbance, unspecified 06/24/2013     Family History  Problem Relation Age of Onset   Diabetes Mother    Prostate cancer Father    Kidney failure Father    Alcoholism Sister        history of   Drug abuse Sister        history of   Stroke Neg Hx      Social History   Socioeconomic History   Marital status: Married    Spouse name: Not on file   Number of children: 2   Years of education: COLLEGE   Highest education level:  Not on file  Occupational History   Occupation: retired  Tobacco Use   Smoking status: Former    Packs/day: 1.50    Years: 45.00    Additional pack years: 0.00    Total pack years: 67.50    Types: Cigarettes    Quit date: 11/19/2015    Years since quitting: 7.3   Smokeless tobacco: Never   Tobacco comments:    pt quit smoking!! 11/19/15  Vaping Use   Vaping Use: Never used  Substance and Sexual Activity   Alcohol use: No    Alcohol/week: 0.0 standard drinks of alcohol   Drug use: No   Sexual activity: Not on file  Other Topics Concern   Not on file  Social History Narrative   Not on file   Social Determinants of Health   Financial  Resource Strain: Not on file  Food Insecurity: Not on file  Transportation Needs: Not on file  Physical Activity: Not on file  Stress: Not on file  Social Connections: Not on file  Intimate Partner Violence: Not on file     Allergies  Allergen Reactions   Morphine Other (See Comments)   Morphine And Codeine     HALLUCINATIONS     Outpatient Medications Prior to Visit  Medication Sig Dispense Refill   apixaban (ELIQUIS) 5 MG TABS tablet Take 1 tablet (5 mg total) by mouth 2 (two) times daily. 60 tablet 5   atorvastatin (LIPITOR) 80 MG tablet Take 1 tablet (80 mg total) by mouth daily. 90 tablet 3   azithromycin (ZITHROMAX) 250 MG tablet Take 1 tablet (250 mg total) by mouth daily. 30 tablet 5   Benralizumab (FASENRA PEN) 30 MG/ML SOAJ Inject 1 mL (30 mg total) into the skin every 8 (eight) weeks. 1 mL 2   Cyanocobalamin 1000 MCG CAPS Take 1,000 mcg by mouth daily.     diltiazem (CARDIZEM CD) 180 MG 24 hr capsule Take 1 capsule (180 mg total) by mouth daily. 90 capsule 3   fluticasone (FLONASE) 50 MCG/ACT nasal spray SHAKE LIQUID AND USE 2 SPRAYS IN EACH NOSTRIL TWICE DAILY 16 g 3   Fluticasone-Umeclidin-Vilant (TRELEGY ELLIPTA) 200-62.5-25 MCG/ACT AEPB Inhale 1 puff into the lungs daily. 28 each 5   Influenza Vac High-Dose Quad (FLUZONE HIGH-DOSE QUADRIVALENT IM) ADM 0.7ML IM UTD     ipratropium (ATROVENT) 0.03 % nasal spray Place 2 sprays into both nostrils every 12 (twelve) hours. 30 mL 12   ipratropium-albuterol (DUONEB) 0.5-2.5 (3) MG/3ML SOLN USE 1 VIAL VIA NEBULIZER(3ML) THREE TIMES DAILY 360 mL 6   levothyroxine (SYNTHROID) 50 MCG tablet Take 50 mcg by mouth every morning.     losartan (COZAAR) 50 MG tablet TAKE 1 TABLET(50 MG) BY MOUTH DAILY 90 tablet 3   montelukast (SINGULAIR) 10 MG tablet TAKE 1 TABLET(10 MG) BY MOUTH AT BEDTIME 30 tablet 11   nitroGLYCERIN (NITROSTAT) 0.4 MG SL tablet Place 1 tablet under the tongue every 5 (five) minutes as needed for chest pain.      Respiratory Therapy Supplies (FLUTTER) DEVI 1 Device by Does not apply route in the morning and at bedtime. 1 each 0   VENTOLIN HFA 108 (90 Base) MCG/ACT inhaler INHALE 2 PUFFS BY MOUTH EVERY 6 HOURS AS NEEDED FOR WHEEZING OR SHORTNESS OF BREATH 18 g 12   predniSONE (DELTASONE) 10 MG tablet Take 4 tablets (40 mg total) by mouth daily with breakfast. 20 tablet 0   No facility-administered medications prior to visit.   Review  of Systems  Constitutional:  Negative for chills, fever, malaise/fatigue and weight loss.  HENT:  Negative for congestion, sinus pain and sore throat.   Eyes: Negative.   Respiratory:  Positive for cough, sputum production and wheezing. Negative for hemoptysis and shortness of breath.   Cardiovascular:  Negative for chest pain, palpitations, orthopnea, claudication and leg swelling.  Gastrointestinal:  Negative for abdominal pain, heartburn, nausea and vomiting.  Genitourinary: Negative.   Musculoskeletal:  Negative for joint pain and myalgias.  Skin:  Negative for rash.  Neurological:  Negative for weakness.  Endo/Heme/Allergies: Negative.   Psychiatric/Behavioral: Negative.     Objective:   Vitals:   03/14/23 1031  BP: 120/72  Pulse: 83  SpO2: 96%  Weight: 209 lb (94.8 kg)  Height: 5\' 9"  (1.753 m)   Physical Exam Constitutional:      General: He is not in acute distress. HENT:     Head: Normocephalic and atraumatic.  Eyes:     Conjunctiva/sclera: Conjunctivae normal.  Cardiovascular:     Rate and Rhythm: Normal rate and regular rhythm.     Pulses: Normal pulses.     Heart sounds: Normal heart sounds. No murmur heard. Pulmonary:     Effort: Pulmonary effort is normal.     Breath sounds: Wheezing and rhonchi present. No rales.  Musculoskeletal:     Right lower leg: No edema.     Left lower leg: No edema.  Skin:    General: Skin is warm and dry.  Neurological:     General: No focal deficit present.     Mental Status: He is alert.    CBC     Component Value Date/Time   WBC 7.7 01/24/2021 1243   RBC 4.94 01/24/2021 1243   HGB 13.7 01/24/2021 1243   HGB 14.7 11/23/2019 1156   HCT 41.9 01/24/2021 1243   HCT 43.8 11/23/2019 1156   PLT 251.0 01/24/2021 1243   PLT 373 11/23/2019 1156   MCV 84.9 01/24/2021 1243   MCV 83 11/23/2019 1156   MCH 27.2 06/01/2020 1807   MCHC 32.6 01/24/2021 1243   RDW 16.0 (H) 01/24/2021 1243   RDW 13.2 11/23/2019 1156   LYMPHSABS 1.3 01/24/2021 1243   LYMPHSABS 1.7 11/23/2019 1156   MONOABS 0.7 01/24/2021 1243   EOSABS 0.2 01/24/2021 1243   EOSABS 0.3 11/23/2019 1156   BASOSABS 0.0 01/24/2021 1243   BASOSABS 0.0 11/23/2019 1156      Latest Ref Rng & Units 04/17/2021   12:01 PM 06/01/2020    6:07 PM 11/23/2019   11:56 AM  BMP  Glucose 65 - 99 mg/dL 161  096  045   BUN 8 - 27 mg/dL 19  12  13    Creatinine 0.76 - 1.27 mg/dL 4.09  8.11  9.14   BUN/Creat Ratio 10 - 24 18   12    Sodium 134 - 144 mmol/L 140  139  140   Potassium 3.5 - 5.2 mmol/L 4.3  4.2  4.3   Chloride 96 - 106 mmol/L 101  104  104   CO2 20 - 29 mmol/L 23  26  21    Calcium 8.6 - 10.2 mg/dL 9.7  9.5  9.3    Chest imaging: LCS CT Chest 04/23/22 Mediastinum/Nodes: No mediastinal or definite hilar adenopathy, given limitations of unenhanced CT.   Lungs/Pleura: No pleural fluid. Mild centrilobular and paraseptal emphysema. Lower lobe predominant bronchial wall thickening. Again identified is relatively diffuse "tree-in-bud" micronodularity. This is primarily similar, with mild  increase in the right lower lobe.   A left upper lobe larger pulmonary nodule of volume derived equivalent diameter 4.5 mm on image 53 is new.  CT Chest 12/07/20 Lung-RADS 2, benign appearance or behavior. Continue annual screening with low-dose chest CT without contrast in 12 months.   Stable tree-in-bud nodularity in the upper lobes likely reflect chronic atypical mycobacterial infection or post infectious inflammatory scarring.   Aortic  Atherosclerosis (ICD10-I70.0) and Emphysema (ICD10-J43.9).  PFT:    Latest Ref Rng & Units 05/04/2022   10:30 AM 11/18/2019    2:52 PM 10/23/2013   11:50 AM  PFT Results  FVC-Pre L 3.09  3.12  4.11   FVC-Predicted Pre % 75  74  93   FVC-Post L  2.85  4.12   FVC-Predicted Post %  67  94   Pre FEV1/FVC % % 51  53  58   Post FEV1/FCV % %  54  58   FEV1-Pre L 1.58  1.64  2.38   FEV1-Predicted Pre % 52  52  72   FEV1-Post L  1.54  2.41   DLCO uncorrected ml/min/mmHg  19.63  21.40   DLCO UNC% %  79  72   DLVA Predicted %  99  72   TLC L  7.38  8.64   TLC % Predicted %  111  130   RV % Predicted %  187  206     2021: Moderate obstruction without bronchodilator reversibility. Significant air trapping without hyperinflation.  Mildly reduced diffusion.   2015: Moderate obstruction with hyperinflation, no significant bronchodilator reversibility.  Mild diffusion impairment.  Echo 06/02/20: 1. Left ventricular ejection fraction, by estimation, is 55 to 60%. The  left ventricle has normal function. The left ventricle has no regional  wall motion abnormalities. Left ventricular diastolic parameters are  consistent with Grade I diastolic  dysfunction (impaired relaxation).   2. Right ventricular systolic function is normal. The right ventricular  size is normal.   3. The mitral valve is normal in structure. Trivial mitral valve  regurgitation. No evidence of mitral stenosis.   4. The aortic valve was not well visualized. Aortic valve regurgitation  is mild. Mild aortic valve sclerosis is present, with no evidence of  aortic valve stenosis.   5. The inferior vena cava is normal in size with greater than 50%  respiratory variability, suggesting right atrial pressure of 3 mmHg.   Heart Catheterization 08/30/2017: The left ventricular systolic function is normal. LV end diastolic pressure is normal. The left ventricular ejection fraction is 55-65% by visual estimate. There is no mitral valve  regurgitation. Prox RCA lesion is 40% stenosed. Prox RCA to Mid RCA lesion is 50% stenosed. Mid RCA-1 lesion is 30% stenosed. Mid RCA-2 lesion is 50% stenosed. Prox Cx lesion is 40% stenosed. Ost LAD to Prox LAD lesion is 30% stenosed.   1. Moderate non-obstructive, calcific stenoses in the RCA 2. Mild non-obstructive, calcific stenoses in the LAD and Circumflex 3. Normal LV systolic function   Recommendations: Medical management of CAD. Tobacco cessation.   Assessment & Plan:   No diagnosis found.  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   He is to continue trelegy ellipta 1 puff daily, azithromycin 250mg  daily and fasenra injections every 2 months. EKG from last month in cardiology shows normal Qtc.   Start prednisone taper. He is  to use nebulizer treatments twice daily follow by flutter valve therapy for airway clearance.   Continue montelukast and allegra daily.  He is being followed with our lung cancer screening program.  He is to continue Flonase nasal spray 2 sprays per nostril each day for nasal congestion and he can use ipratropium nasal spray as needed.  He is to follow up in 3 months.  Melody Comas, MD [facility] Office: [phone]    Current Outpatient Medications:    apixaban (ELIQUIS) 5 MG TABS tablet, Take 1 tablet (5 mg total) by mouth 2 (two) times daily., Disp: 60 tablet, Rfl: 5   atorvastatin (LIPITOR) 80 MG tablet, Take 1 tablet (80 mg total) by mouth daily., Disp: 90 tablet, Rfl: 3   azithromycin (ZITHROMAX) 250 MG tablet, Take 1 tablet (250 mg total) by mouth daily., Disp: 30 tablet, Rfl: 5   Benralizumab (FASENRA PEN) 30 MG/ML SOAJ, Inject 1 mL (30 mg total) into the skin every 8 (eight) weeks., Disp: 1 mL, Rfl: 2   Cyanocobalamin 1000 MCG CAPS, Take 1,000 mcg by mouth daily., Disp: , Rfl:     diltiazem (CARDIZEM CD) 180 MG 24 hr capsule, Take 1 capsule (180 mg total) by mouth daily., Disp: 90 capsule, Rfl: 3   fluticasone (FLONASE) 50 MCG/ACT nasal spray, SHAKE LIQUID AND USE 2 SPRAYS IN EACH NOSTRIL TWICE DAILY, Disp: 16 g, Rfl: 3   Fluticasone-Umeclidin-Vilant (TRELEGY ELLIPTA) 200-62.5-25 MCG/ACT AEPB, Inhale 1 puff into the lungs daily., Disp: 28 each, Rfl: 5   Influenza Vac High-Dose Quad (FLUZONE HIGH-DOSE QUADRIVALENT IM), ADM 0.7ML IM UTD, Disp: , Rfl:    ipratropium (ATROVENT) 0.03 % nasal spray, Place 2 sprays into both nostrils every 12 (twelve) hours., Disp: 30 mL, Rfl: 12   ipratropium-albuterol (DUONEB) 0.5-2.5 (3) MG/3ML SOLN, USE 1 VIAL VIA NEBULIZER(3ML) THREE TIMES DAILY, Disp: 360 mL, Rfl: 6   levothyroxine (SYNTHROID) 50 MCG tablet, Take 50 mcg by mouth every morning., Disp: , Rfl:    losartan (COZAAR) 50 MG tablet, TAKE 1 TABLET(50 MG) BY MOUTH DAILY, Disp: 90 tablet, Rfl: 3   montelukast (SINGULAIR) 10 MG tablet, TAKE 1 TABLET(10 MG) BY MOUTH AT BEDTIME, Disp: 30 tablet, Rfl: 11   nitroGLYCERIN (NITROSTAT) 0.4 MG SL tablet, Place 1 tablet under the tongue every 5 (five) minutes as needed for chest pain., Disp: , Rfl:    Respiratory Therapy Supplies (FLUTTER) DEVI, 1 Device by Does not apply route in the morning and at bedtime., Disp: 1 each, Rfl: 0   VENTOLIN HFA 108 (90 Base) MCG/ACT inhaler, INHALE 2 PUFFS BY MOUTH EVERY 6 HOURS AS NEEDED FOR WHEEZING OR SHORTNESS OF BREATH, Disp: 18 g, Rfl: 12

## 2023-03-21 ENCOUNTER — Other Ambulatory Visit: Payer: Self-pay | Admitting: Pharmacist

## 2023-03-21 DIAGNOSIS — J4489 Other specified chronic obstructive pulmonary disease: Secondary | ICD-10-CM

## 2023-03-21 MED ORDER — FASENRA PEN 30 MG/ML ~~LOC~~ SOAJ: 30.0000 mg | SUBCUTANEOUS | 4 refills | Status: DC

## 2023-03-21 NOTE — Telephone Encounter (Signed)
Refill sent for [facility] to  [facility] via fax  Dose: 30 mg SQ every 8 weeks  Last OV: 03/14/23 Provider: Dr. Stacy Gardner, PharmD, MPH, BCPS Clinical Pharmacist (Rheumatology and Pulmonology)

## 2023-03-22 ENCOUNTER — Telehealth: Payer: Self-pay | Admitting: *Deleted

## 2023-03-22 ENCOUNTER — Ambulatory Visit (HOSPITAL_COMMUNITY): Payer: Medicare Other | Attending: Cardiology

## 2023-03-22 DIAGNOSIS — I480 Paroxysmal atrial fibrillation: Secondary | ICD-10-CM | POA: Insufficient documentation

## 2023-03-22 DIAGNOSIS — R0609 Other forms of dyspnea: Secondary | ICD-10-CM | POA: Insufficient documentation

## 2023-03-22 DIAGNOSIS — I351 Nonrheumatic aortic (valve) insufficiency: Secondary | ICD-10-CM

## 2023-03-22 DIAGNOSIS — I2510 Atherosclerotic heart disease of native coronary artery without angina pectoris: Secondary | ICD-10-CM | POA: Diagnosis not present

## 2023-03-22 DIAGNOSIS — I10 Essential (primary) hypertension: Secondary | ICD-10-CM | POA: Diagnosis not present

## 2023-03-22 DIAGNOSIS — I7121 Aneurysm of the ascending aorta, without rupture: Secondary | ICD-10-CM

## 2023-03-22 DIAGNOSIS — I77819 Aortic ectasia, unspecified site: Secondary | ICD-10-CM

## 2023-03-22 LAB — ECHOCARDIOGRAM COMPLETE
Area-P 1/2: 3.63 cm2
Calc EF: 53.2 %
P 1/2 time: 463 msec
S' Lateral: 3.6 cm
Single Plane A2C EF: 54.4 %
Single Plane A4C EF: 56.4 %

## 2023-03-22 NOTE — Telephone Encounter (Signed)
The patient has been notified of the result and verbalized understanding.  All questions (if any) were answered.  Pt aware that we will order for him to get another echo done in one year and our Echo Scheduler will call him soon to arrange that appt for that time.   Pt is aware that we will proceed with ordered PET Scan.  Pt verbalized understanding and agrees with this plan.

## 2023-03-22 NOTE — Telephone Encounter (Signed)
-----   Message from Meriam Sprague, MD sent at 03/22/2023  3:00 PM EDT ----- His echo shows that his heart pumping function is low normal. There is mild leakiness of his aortic valve and mild dilation of his aorta. This will not cause symptoms but something we will monitor with yearly echoes going forward. There is a small area of the heart muscle that moves slightly less robustly. We have the PET scan planned to assess this further.

## 2023-03-25 ENCOUNTER — Encounter (HOSPITAL_COMMUNITY): Payer: Self-pay

## 2023-04-06 ENCOUNTER — Other Ambulatory Visit: Payer: Self-pay | Admitting: Pulmonary Disease

## 2023-04-12 ENCOUNTER — Other Ambulatory Visit: Payer: Self-pay | Admitting: *Deleted

## 2023-04-12 MED ORDER — ATORVASTATIN CALCIUM 80 MG PO TABS: 80.0000 mg | ORAL_TABLET | Freq: Every day | ORAL | 3 refills | Status: DC

## 2023-04-26 DIAGNOSIS — M1711 Unilateral primary osteoarthritis, right knee: Secondary | ICD-10-CM | POA: Diagnosis not present

## 2023-05-06 ENCOUNTER — Encounter (HOSPITAL_COMMUNITY): Payer: Self-pay

## 2023-05-08 ENCOUNTER — Ambulatory Visit (HOSPITAL_COMMUNITY)
Admission: RE | Admit: 2023-05-08 | Discharge: 2023-05-08 | Disposition: A | Discharge status: Home / Self Care | Payer: Medicare Other | Source: Ambulatory Visit | Attending: Cardiology | Admitting: Cardiology

## 2023-05-08 DIAGNOSIS — I2510 Atherosclerotic heart disease of native coronary artery without angina pectoris: Secondary | ICD-10-CM | POA: Diagnosis not present

## 2023-05-08 DIAGNOSIS — R0609 Other forms of dyspnea: Secondary | ICD-10-CM

## 2023-05-08 LAB — NM PET CT CARDIAC PERFUSION MULTI W/ABSOLUTE BLOODFLOW
MBFR: 2.28
Nuc Rest EF: 44 %
Nuc Stress EF: 52 %
Rest MBF: 0.88 ml/g/min
ST Depression (mm): 0 mm
Stress MBF: 2.01 ml/g/min
TID: 0.95

## 2023-05-08 MED ORDER — REGADENOSON 0.4 MG/5ML IV SOLN
INTRAVENOUS | Status: AC
  Filled 2023-05-08: qty 5

## 2023-05-08 MED ORDER — RUBIDIUM RB82 GENERATOR (RUBYFILL)
25.0000 | PACK | Freq: Once | INTRAVENOUS | Status: AC
  Administered 2023-05-08: 24.55 via INTRAVENOUS

## 2023-05-08 MED ORDER — RUBIDIUM RB82 GENERATOR (RUBYFILL)
25.0000 | PACK | Freq: Once | INTRAVENOUS | Status: AC
  Administered 2023-05-08: 24.83 via INTRAVENOUS

## 2023-05-08 MED ORDER — REGADENOSON 0.4 MG/5ML IV SOLN
0.4000 mg | Freq: Once | INTRAVENOUS | Status: AC
  Administered 2023-05-08: 0.4 mg via INTRAVENOUS

## 2023-05-09 DIAGNOSIS — E785 Hyperlipidemia, unspecified: Secondary | ICD-10-CM | POA: Diagnosis not present

## 2023-05-09 DIAGNOSIS — E538 Deficiency of other specified B group vitamins: Secondary | ICD-10-CM | POA: Diagnosis not present

## 2023-05-09 DIAGNOSIS — E119 Type 2 diabetes mellitus without complications: Secondary | ICD-10-CM | POA: Diagnosis not present

## 2023-05-09 DIAGNOSIS — Z0000 Encounter for general adult medical examination without abnormal findings: Secondary | ICD-10-CM | POA: Diagnosis not present

## 2023-05-09 DIAGNOSIS — I10 Essential (primary) hypertension: Secondary | ICD-10-CM | POA: Diagnosis not present

## 2023-05-09 DIAGNOSIS — J449 Chronic obstructive pulmonary disease, unspecified: Secondary | ICD-10-CM | POA: Diagnosis not present

## 2023-05-09 DIAGNOSIS — I4891 Unspecified atrial fibrillation: Secondary | ICD-10-CM | POA: Diagnosis not present

## 2023-05-09 DIAGNOSIS — E1169 Type 2 diabetes mellitus with other specified complication: Secondary | ICD-10-CM | POA: Diagnosis not present

## 2023-05-13 ENCOUNTER — Encounter (HOSPITAL_COMMUNITY): Payer: Self-pay

## 2023-05-13 ENCOUNTER — Ambulatory Visit (HOSPITAL_COMMUNITY)
Admission: RE | Admit: 2023-05-13 | Discharge: 2023-05-13 | Disposition: A | Discharge status: Home / Self Care | Payer: Medicare Other | Source: Ambulatory Visit | Attending: Acute Care | Admitting: Acute Care

## 2023-05-13 DIAGNOSIS — Z87891 Personal history of nicotine dependence: Secondary | ICD-10-CM | POA: Diagnosis not present

## 2023-05-13 DIAGNOSIS — R911 Solitary pulmonary nodule: Secondary | ICD-10-CM | POA: Insufficient documentation

## 2023-05-14 ENCOUNTER — Telehealth: Payer: Self-pay | Admitting: Cardiology

## 2023-05-14 NOTE — Telephone Encounter (Signed)
Left the pt a message to call the office back to endorse Cardiac PET Scan results per DOD Dr. Eldridge Dace.

## 2023-05-14 NOTE — Telephone Encounter (Signed)
Patient would like to speak with nurse in regards to CT results.

## 2023-05-14 NOTE — Telephone Encounter (Signed)
Pt is calling in for Cardiac PET Scan results that was ordered by Dr. Shari Prows back in May (pt had done on 8/8).  Looks like the Cardiac PET scan result was preliminary read by Cardiology and Radiology.  There is no official final result on this test, being Dr. Shari Prows is no longer with the practice and the pt has not been reassigned to his new General Cardiologist yet.   Will route this message and Cardiac PET Scan results to our DOD Dr. Eldridge Dace,  to see if he could finalize this result for the pt, so that nursing can review with the pt thereafter.

## 2023-05-14 NOTE — Telephone Encounter (Signed)
-----   Message from [facility] sent at 05/14/2023  4:18 PM EDT ----- Normal study.  Coronary calcification noted so should continue with preventive therapy.

## 2023-05-15 ENCOUNTER — Telehealth: Payer: Self-pay | Admitting: Acute Care

## 2023-05-15 NOTE — Telephone Encounter (Signed)
Patient is calling wanting the results of his latest CT scan. Please give him a call at [phone].

## 2023-05-15 NOTE — Telephone Encounter (Signed)
The patient has been notified of the result and verbalized understanding.  All questions (if any) were answered. Loa Socks, LPN 06/14/7828 5:62 AM

## 2023-05-15 NOTE — Telephone Encounter (Signed)
Pt was returning nurse Ivy's call and is requesting a callback. Please advise

## 2023-05-15 NOTE — Telephone Encounter (Signed)
Spoke with patient by phone and informed his results have not yet been finalized.  Will give him a call when results are available.  Patient verbalized understanding.

## 2023-05-16 ENCOUNTER — Other Ambulatory Visit: Payer: Self-pay | Admitting: Pulmonary Disease

## 2023-05-16 DIAGNOSIS — J449 Chronic obstructive pulmonary disease, unspecified: Secondary | ICD-10-CM

## 2023-05-17 ENCOUNTER — Telehealth: Payer: Self-pay | Admitting: Acute Care

## 2023-05-17 DIAGNOSIS — Z122 Encounter for screening for malignant neoplasm of respiratory organs: Secondary | ICD-10-CM

## 2023-05-17 DIAGNOSIS — Z87891 Personal history of nicotine dependence: Secondary | ICD-10-CM

## 2023-05-17 NOTE — Telephone Encounter (Signed)
Spoke with patient by phone and reviewed the results of the follow up LDCT.  Provider notes show largest nodule has decreased in size and is not concerning at this time for cancer.  Will plan for annual follow up LDCT.  Patient acknowledged understanding and is in agreement with plan to return to annual scan.  Results/plan faxed to PCP and order placed for annual LDCT

## 2023-05-17 NOTE — Telephone Encounter (Signed)
LDCT  dated 05/13/2023 shows stable nodules . The largest nodule has actually decreased in size from 11.6 mm to 9.8 mm.  12 month follow up ok. Please fax results to PCP and order 12 month follow up scan. Thanks so much  Incidental findings of Cholelithiasis.>> ( Gall stones) Chronic calcific pancreatitis. Aortic atherosclerosis (ICD10-I70.0). Coronary artery calcification.( On statin therapy, followed by cardiology. Recent Calcium scoring scan showed low risk per Dr. Eldridge Dace Emphysema (ICD10-J43.9).

## 2023-05-20 DIAGNOSIS — R051 Acute cough: Secondary | ICD-10-CM | POA: Diagnosis not present

## 2023-05-20 DIAGNOSIS — U071 COVID-19: Secondary | ICD-10-CM | POA: Diagnosis not present

## 2023-05-20 DIAGNOSIS — R5383 Other fatigue: Secondary | ICD-10-CM | POA: Diagnosis not present

## 2023-05-20 DIAGNOSIS — R0981 Nasal congestion: Secondary | ICD-10-CM | POA: Diagnosis not present

## 2023-05-29 DIAGNOSIS — E1169 Type 2 diabetes mellitus with other specified complication: Secondary | ICD-10-CM | POA: Diagnosis not present

## 2023-06-09 ENCOUNTER — Other Ambulatory Visit: Payer: Self-pay | Admitting: Pulmonary Disease

## 2023-06-09 DIAGNOSIS — J449 Chronic obstructive pulmonary disease, unspecified: Secondary | ICD-10-CM

## 2023-07-03 ENCOUNTER — Other Ambulatory Visit: Payer: Self-pay | Admitting: *Deleted

## 2023-07-03 DIAGNOSIS — I480 Paroxysmal atrial fibrillation: Secondary | ICD-10-CM

## 2023-07-03 MED ORDER — APIXABAN 5 MG PO TABS: 5.0000 mg | ORAL_TABLET | Freq: Two times a day (BID) | ORAL | 5 refills | Status: DC

## 2023-07-03 NOTE — Telephone Encounter (Signed)
Eliquis 5mg  refill request received. Patient is 72 years old, weight-94.8kg, Crea-1.00 on 05/09/23 via [facility] from [place], [place], and last seen by Dr. Shari Prows on 02/19/23 & follow up with Tereso Newcomer on 08/19/23. Dose is appropriate based on dosing criteria. Will send in refill to requested pharmacy.

## 2023-07-05 DIAGNOSIS — J449 Chronic obstructive pulmonary disease, unspecified: Secondary | ICD-10-CM | POA: Diagnosis not present

## 2023-07-16 ENCOUNTER — Other Ambulatory Visit: Payer: Self-pay

## 2023-07-16 ENCOUNTER — Telehealth: Payer: Self-pay | Admitting: Cardiology

## 2023-07-16 DIAGNOSIS — I480 Paroxysmal atrial fibrillation: Secondary | ICD-10-CM

## 2023-07-16 NOTE — Telephone Encounter (Signed)
*  STAT* If patient is at the pharmacy, call can be transferred to refill team.   1. Which medications need to be refilled? (please list name of each medication and dose if known)   apixaban (ELIQUIS) 5 MG TABS tablet   2. Would you like to learn more about the convenience, safety, & potential cost savings by using the [facility]?   3. Are you open to using the [facility] (Type [facility]. ).  4. Which pharmacy/location (including street and city if local pharmacy) is medication to be sent to?  [id number] - [place], [place] - [address] AT [facility] OF [address]   5. Do they need a 30 day or 90 day supply?   30 day  Patient stated he still has some of this medication.  Patient has appointment scheduled on 11/18 .

## 2023-07-17 ENCOUNTER — Other Ambulatory Visit (HOSPITAL_COMMUNITY): Payer: Self-pay

## 2023-07-17 ENCOUNTER — Encounter (HOSPITAL_COMMUNITY): Payer: Self-pay

## 2023-07-17 MED ORDER — APIXABAN 5 MG PO TABS
5.0000 mg | ORAL_TABLET | Freq: Two times a day (BID) | ORAL | 5 refills | Status: DC
  Filled 2023-07-17: qty 60, 30d supply, fill #0

## 2023-07-17 NOTE — Telephone Encounter (Signed)
Prescription refill request for Eliquis received. Indication: PAF/CVA Last office visit: 02/19/23  Jon Billings MD Scr: 1.00 on 05/09/23  KPN Age: 72 Weight: 94.8kg  Based on above findings Eliquis 5mg  twice daily is the appropriate dose.  Refill approved.

## 2023-07-24 ENCOUNTER — Telehealth: Payer: Self-pay | Admitting: Pharmacist

## 2023-07-24 NOTE — Telephone Encounter (Signed)
Received fax from [employer] that patient is conditionally enrolled into [place] patient assistance program for 2025. A re-enrollment application will need to be completed as well as proof of denial or fund closure from 3 independent charitable foundations (time stamped on or after 10/02/2023)  Patient portion mailed today to home today. Provider portion placed in Dr. Lanora Manis mailbox for signature  Chesley Mires, PharmD, MPH, BCPS, CPP Clinical Pharmacist (Rheumatology and Pulmonology)

## 2023-08-05 DIAGNOSIS — J449 Chronic obstructive pulmonary disease, unspecified: Secondary | ICD-10-CM | POA: Diagnosis not present

## 2023-08-08 DIAGNOSIS — Z23 Encounter for immunization: Secondary | ICD-10-CM | POA: Diagnosis not present

## 2023-08-10 ENCOUNTER — Other Ambulatory Visit: Payer: Self-pay | Admitting: Pulmonary Disease

## 2023-08-10 DIAGNOSIS — J449 Chronic obstructive pulmonary disease, unspecified: Secondary | ICD-10-CM

## 2023-08-13 NOTE — Telephone Encounter (Signed)
Patient is in person returning [employer] form. Will place in Pharmacy box.

## 2023-08-14 NOTE — Telephone Encounter (Signed)
Patient portion has been received, placed in "PAP Pending" folder.

## 2023-08-18 NOTE — Progress Notes (Unsigned)
Cardiology Office Note:    Date:  08/19/2023  ID:  Nicholas Camacho, DOB 03-18-1951, MRN [id number] PCP: Farris Has, MD  [facility] Providers Cardiologist:  None       Patient Profile:      Coronary artery disease, non-obstructive  CCTA 08/20/2017 with calcium score 3244 (99th percentile) LHC he 08/30/2017: EF 55-65; proximal RCA 40, mid RCA 50, 30, 50; LCx proximal 40; ostial LAD 30 TTE 03/22/2023: EF 50-55, inferior HK, normal RVSF, normal PASP, RVSP 21.1, mild AI, AV sclerosis, ascending aorta 41 mm, RAP 3 Myocardial PET 05/08/2023: No ischemia, diaphragmatic attenuation, normal myocardial blood flow reserve, EF 44, low risk, coronary artery Ca2+ Paroxysmal atrial fibrillation S/p CVA 06/2020 2/2 missed doses of Eliquis Carotid artery stenosis [place] 03/02/2022: Bilateral ICA 1-39 Iliac artery aneurysm Ascending aorta dilation Chest CTA 04/21/2021: No thoracic aorta dilation TTE 03/2023: 41 mm Hypertension Hyperlipidemia Diabetes mellitus  Chronic Obstructive Pulmonary Disease OSA Abdominal aortic aneurysm         History of Present Illness:  Discussed the use of AI scribe software for clinical note transcription with the patient, who gave verbal consent to proceed.  Nicholas Camacho is a 72 y.o. male who returns for follow up of CAD, AFib. He was last seen by Dr. Shari Prows 01/2023. He noted some dyspnea on exertionMr.  Myocardial PET was obtained which was low risk. He is here alone.  He continues to note chronic shortness of breath, which he attributes to his COPD and emphysema. The shortness of breath is particularly noticeable when climbing stairs.  He sometimes uses O2 at home.  He is followed by pulmonology.  The patient denies any chest discomfort, syncope, or leg swelling.      Review of Systems  Respiratory:  Positive for cough.   Gastrointestinal:  Negative for hematochezia and melena.  Genitourinary:  Negative for hematuria.  See HPI     Studies  Reviewed:       Labs from [facility] reviewed 05/09/2023 BUN 15, GFR 80, TC 85, HDL 34, hemoglobin 13.1, LDL 40, K+ 4.3, triglycerides 44, TSH 3.43 Results          Risk Assessment/Calculations:    CHA2DS2-VASc Score =     This indicates a  % annual risk of stroke. The patient's score is based upon:             Physical Exam:   VS:  BP 121/69   Pulse 78   Ht 5\' 9"  (1.753 m)   Wt 207 lb 9.6 oz (94.2 kg)   SpO2 92%   BMI 30.66 kg/m    Wt Readings from Last 3 Encounters:  08/19/23 207 lb 9.6 oz (94.2 kg)  03/14/23 209 lb (94.8 kg)  02/19/23 209 lb (94.8 kg)    Constitutional:      Appearance: Healthy appearance. Not in distress.  Neck:     Vascular: No carotid bruit. JVD normal.  Pulmonary:     Breath sounds: Normal breath sounds. No wheezing. No rales.  Cardiovascular:     Normal rate. Regular rhythm.     Murmurs: There is no murmur.  Edema:    Peripheral edema absent.  Abdominal:     Palpations: Abdomen is soft.        Assessment and Plan:   Assessment & Plan Coronary artery disease involving native coronary artery of native heart without angina pectoris Moderate non-obstructive disease by cardiac catheterization in November 2018. No ischemia on myocardial PET in  August 2024. No angina. Blood pressure and cholesterol well-controlled.  - Continue Lipitor 80 mg daily - Continue nitroglycerin as needed - follow up 6 months Paroxysmal atrial fibrillation (HCC) Rhythm regular on exam. Last EKG in May 2024 showed sinus rhythm. Tolerating anticoagulation well.  - Continue Eliquis 5 mg twice daily, diltiazem 180 mg daily Mild aortic regurgitation Repeat echo pending in June 2025. Dilatation of aorta (HCC) 41 mm dilation by echocardiogram in June 2024. Mild aortic insufficiency. No symptoms suggesting worsening. Discussed need for regular monitoring. - Repeat echocardiogram in June 2025 Essential hypertension Blood pressure controlled. - Continue Cardizem 180 mg daily -  Continue Losartan 50 mg daily Pure hypercholesterolemia LDL optimal.  Continue Lipitor 80 mg daily. COPD GOLD II B He continues to struggle with chronic shortness of breath with exertion.  He follows with pulmonology.  I have encouraged him to continue follow-up with pulmonology.           Dispo:  Return in about 6 months (around 02/16/2024) for Routine Follow Up, w/ Dr. Anne Fu, or Tereso Newcomer, PA-C.  Signed, Tereso Newcomer, PA-C

## 2023-08-19 ENCOUNTER — Ambulatory Visit: Payer: Medicare Other | Attending: Physician Assistant | Admitting: Physician Assistant

## 2023-08-19 ENCOUNTER — Encounter: Payer: Self-pay | Admitting: Physician Assistant

## 2023-08-19 VITALS — BP 121/69 | HR 78 | Ht 69.0 in | Wt 207.6 lb

## 2023-08-19 DIAGNOSIS — I10 Essential (primary) hypertension: Secondary | ICD-10-CM

## 2023-08-19 DIAGNOSIS — I351 Nonrheumatic aortic (valve) insufficiency: Secondary | ICD-10-CM

## 2023-08-19 DIAGNOSIS — I480 Paroxysmal atrial fibrillation: Secondary | ICD-10-CM

## 2023-08-19 DIAGNOSIS — I77819 Aortic ectasia, unspecified site: Secondary | ICD-10-CM

## 2023-08-19 DIAGNOSIS — E7800 Pure hypercholesterolemia, unspecified: Secondary | ICD-10-CM

## 2023-08-19 DIAGNOSIS — I2510 Atherosclerotic heart disease of native coronary artery without angina pectoris: Secondary | ICD-10-CM | POA: Diagnosis not present

## 2023-08-19 DIAGNOSIS — J449 Chronic obstructive pulmonary disease, unspecified: Secondary | ICD-10-CM

## 2023-08-19 NOTE — Assessment & Plan Note (Signed)
Blood pressure controlled. - Continue Cardizem 180 mg daily - Continue Losartan 50 mg daily

## 2023-08-19 NOTE — Patient Instructions (Signed)
Medication Instructions:  Your physician recommends that you continue on your current medications as directed. Please refer to the Current Medication list given to you today. *If you need a refill on your cardiac medications before your next appointment, please call your pharmacy*   Lab Work: None ordered   Testing/Procedures: None ordered   Follow-Up: At [facility], you and your health needs are our priority.  As part of our continuing mission to provide you with exceptional heart care, we have created designated Provider Care Teams.  These Care Teams include your primary Cardiologist (physician) and Advanced Practice Providers (APPs -  Physician Assistants and Nurse Practitioners) who all work together to provide you with the care you need, when you need it.  We recommend signing up for the patient portal called "MyChart".  Sign up information is provided on this After Visit Summary.  MyChart is used to connect with patients for Virtual Visits (Telemedicine).  Patients are able to view lab/test results, encounter notes, upcoming appointments, etc.  Non-urgent messages can be sent to your provider as well.   To learn more about what you can do with MyChart, go to [email or web address].    Your next appointment:   6 month(s)  Provider:   Donato Schultz, MD or Tereso Newcomer, PA-C     Other Instructions

## 2023-08-19 NOTE — Assessment & Plan Note (Signed)
Moderate non-obstructive disease by cardiac catheterization in November 2018. No ischemia on myocardial PET in August 2024. No angina. Blood pressure and cholesterol well-controlled.  - Continue Lipitor 80 mg daily - Continue nitroglycerin as needed - follow up 6 months

## 2023-08-19 NOTE — Assessment & Plan Note (Signed)
He continues to struggle with chronic shortness of breath with exertion.  He follows with pulmonology.  I have encouraged him to continue follow-up with pulmonology.

## 2023-08-19 NOTE — Assessment & Plan Note (Signed)
Rhythm regular on exam. Last EKG in May 2024 showed sinus rhythm. Tolerating anticoagulation well.  - Continue Eliquis 5 mg twice daily, diltiazem 180 mg daily

## 2023-08-19 NOTE — Assessment & Plan Note (Signed)
LDL optimal.  Continue Lipitor 80 mg daily.

## 2023-08-21 NOTE — Telephone Encounter (Signed)
Received signed provider and patient form. Faxed to Onbase with med list and insurance card copy  Submitted a Prior Authorization request to [facility] for [facility] via CoverMyMeds. Will update once we receive a response.  Key: [id number]   Chesley Mires, PharmD, MPH, BCPS, CPP Clinical Pharmacist (Rheumatology and Pulmonology)

## 2023-08-22 NOTE — Telephone Encounter (Signed)
Received notification from [facility] regarding a prior authorization for [facility]. Authorization has been APPROVED from 08/21/2023 to 08/2024. Approval letter sent to scan center.  Scanned into Onbase with PAP renewal to be submitted to [employer] after 10/02/2023 with screenshot of three grant closures  Chesley Mires, PharmD, MPH, BCPS, CPP Clinical Pharmacist (Rheumatology and Pulmonology)

## 2023-08-31 ENCOUNTER — Other Ambulatory Visit: Payer: Self-pay | Admitting: Pulmonary Disease

## 2023-10-03 NOTE — Telephone Encounter (Signed)
 Doctor, general practice. Submitted Patient Assistance RENEWAL Application to [employer] for F[employer]RA  along with provider portion, patient portion, PA, medication list, insurance card copy and dated grant closure screenshots. Will update patient when we receive a response.  Phone #: [phone] Fax #: [phone]

## 2023-10-14 NOTE — Telephone Encounter (Signed)
 Received a fax from  [employer] regarding an approval for [employer]RA  patient assistance from 10/14/2023 to 09/30/2024. Approval letter sent to scan center.  Phone #: [phone] Fax #: [phone] [id number]  Sherry Pennant, PharmD, MPH, BCPS, CPP Clinical Pharmacist (Rheumatology and Pulmonology)

## 2023-11-04 ENCOUNTER — Other Ambulatory Visit: Payer: Self-pay | Admitting: Pulmonary Disease

## 2023-11-04 DIAGNOSIS — J309 Allergic rhinitis, unspecified: Secondary | ICD-10-CM

## 2023-11-06 ENCOUNTER — Telehealth: Payer: Self-pay | Admitting: Pulmonary Disease

## 2023-11-06 DIAGNOSIS — J309 Allergic rhinitis, unspecified: Secondary | ICD-10-CM

## 2023-11-06 MED ORDER — MONTELUKAST SODIUM 10 MG PO TABS: 10.0000 mg | ORAL_TABLET | Freq: Every day | ORAL | 11 refills | Status: DC

## 2023-11-06 NOTE — Telephone Encounter (Signed)
 Patient informed. NFN

## 2023-11-06 NOTE — Telephone Encounter (Signed)
 Refill sent.

## 2023-11-06 NOTE — Telephone Encounter (Signed)
Lm x1 for patient.   Dr. Francine Graven, please advise on Singulair refill. Thanks

## 2023-11-11 DIAGNOSIS — I10 Essential (primary) hypertension: Secondary | ICD-10-CM | POA: Diagnosis not present

## 2023-11-11 DIAGNOSIS — I4891 Unspecified atrial fibrillation: Secondary | ICD-10-CM | POA: Diagnosis not present

## 2023-11-11 DIAGNOSIS — E538 Deficiency of other specified B group vitamins: Secondary | ICD-10-CM | POA: Diagnosis not present

## 2023-11-11 DIAGNOSIS — E785 Hyperlipidemia, unspecified: Secondary | ICD-10-CM | POA: Diagnosis not present

## 2023-11-11 DIAGNOSIS — E1169 Type 2 diabetes mellitus with other specified complication: Secondary | ICD-10-CM | POA: Diagnosis not present

## 2023-11-11 DIAGNOSIS — D649 Anemia, unspecified: Secondary | ICD-10-CM | POA: Diagnosis not present

## 2023-11-11 DIAGNOSIS — J449 Chronic obstructive pulmonary disease, unspecified: Secondary | ICD-10-CM | POA: Diagnosis not present

## 2023-11-18 DIAGNOSIS — Z1211 Encounter for screening for malignant neoplasm of colon: Secondary | ICD-10-CM | POA: Diagnosis not present

## 2023-11-19 ENCOUNTER — Other Ambulatory Visit: Payer: Self-pay

## 2023-11-19 MED ORDER — LOSARTAN POTASSIUM 50 MG PO TABS: ORAL_TABLET | ORAL | 2 refills | Status: AC

## 2023-12-04 ENCOUNTER — Other Ambulatory Visit: Payer: Self-pay | Admitting: Pulmonary Disease

## 2023-12-04 DIAGNOSIS — J449 Chronic obstructive pulmonary disease, unspecified: Secondary | ICD-10-CM

## 2023-12-29 ENCOUNTER — Other Ambulatory Visit: Payer: Self-pay | Admitting: Pulmonary Disease

## 2023-12-29 DIAGNOSIS — J4489 Other specified chronic obstructive pulmonary disease: Secondary | ICD-10-CM

## 2024-01-07 DIAGNOSIS — D649 Anemia, unspecified: Secondary | ICD-10-CM | POA: Diagnosis not present

## 2024-01-28 ENCOUNTER — Other Ambulatory Visit: Payer: Self-pay | Admitting: Physician Assistant

## 2024-01-28 DIAGNOSIS — I480 Paroxysmal atrial fibrillation: Secondary | ICD-10-CM

## 2024-01-29 NOTE — Telephone Encounter (Addendum)
 Eliquis  5mg  refill request received. Patient is 73 years old, weight-94.2kg, Crea-1.03 on 04/17/21-NEED LABS, Diagnosis-Afib, and last seen by Marlyse Single on 08/19/23. Dose is appropriate based on dosing criteria.   Pt needs labs, will call PCP.   Spoke with PCP receptionist and asked to have last labs faxed over to [phone]. Will await.

## 2024-01-29 NOTE — Telephone Encounter (Signed)
 Labs received from PCP, last labs 11/11/23 Creat 1.11, age 73, weight 94.2kg, based on specified criteria pt is on appropriate dosage of Eliquis  5mg  BID for afib.  Will refill rx.

## 2024-02-16 ENCOUNTER — Other Ambulatory Visit: Payer: Self-pay | Admitting: Pulmonary Disease

## 2024-02-17 ENCOUNTER — Other Ambulatory Visit: Payer: Self-pay | Admitting: Pulmonary Disease

## 2024-02-17 NOTE — Telephone Encounter (Signed)
 Copied from CRM [id number]. Topic: Clinical - Medication Refill >> Feb 17, 2024 10:46 AM Isabell A wrote: Medication: ipratropium-albuterol  (DUONEB) 0.5-2.5 (3) MG/3ML SOLN [  Has the patient contacted their pharmacy? Yes (Agent: If no, request that the patient contact the pharmacy for the refill. If patient does not wish to contact the pharmacy document the reason why and proceed with request.) (Agent: If yes, when and what did the pharmacy advise?)  This is the patient's preferred pharmacy:  [id number] [place], [place] - [address] AT [facility] OF [facility] [address] [facility] [address] [place] [address] Phone: [phone] Fax: [phone]  Is this the correct pharmacy for this prescription? Yes If no, delete pharmacy and type the correct one.   Has the prescription been filled recently? 2024  Is the patient out of the medication? Yes  Has the patient been seen for an appointment in the last year OR does the patient have an upcoming appointment? Yes  Can we respond through MyChart? Yes  Agent: Please be advised that Rx refills may take up to 3 business days. We ask that you follow-up with your pharmacy.

## 2024-02-17 NOTE — Telephone Encounter (Signed)
 Called patient to verify information was accurate on this refill request---No answer---Left patient a voicemail.

## 2024-02-17 NOTE — Telephone Encounter (Signed)
 3:24pm on 02/17/2024   Patient called back to advise that he just needed a refill on this medication and did not have any new problems/concerns.

## 2024-02-18 MED ORDER — IPRATROPIUM-ALBUTEROL 0.5-2.5 (3) MG/3ML IN SOLN: RESPIRATORY_TRACT | 6 refills | Status: AC

## 2024-02-26 DIAGNOSIS — D649 Anemia, unspecified: Secondary | ICD-10-CM | POA: Diagnosis not present

## 2024-03-02 ENCOUNTER — Other Ambulatory Visit: Payer: Self-pay

## 2024-03-02 ENCOUNTER — Other Ambulatory Visit: Payer: Self-pay | Admitting: Pulmonary Disease

## 2024-03-02 DIAGNOSIS — J4489 Other specified chronic obstructive pulmonary disease: Secondary | ICD-10-CM

## 2024-03-02 DIAGNOSIS — I10 Essential (primary) hypertension: Secondary | ICD-10-CM

## 2024-03-02 DIAGNOSIS — I480 Paroxysmal atrial fibrillation: Secondary | ICD-10-CM

## 2024-03-02 MED ORDER — DILTIAZEM HCL ER COATED BEADS 180 MG PO CP24: 180.0000 mg | ORAL_CAPSULE | Freq: Every day | ORAL | 1 refills | Status: DC

## 2024-03-03 ENCOUNTER — Telehealth: Payer: Self-pay | Admitting: Pulmonary Disease

## 2024-03-03 DIAGNOSIS — J4489 Other specified chronic obstructive pulmonary disease: Secondary | ICD-10-CM

## 2024-03-03 MED ORDER — TRELEGY ELLIPTA 200-62.5-25 MCG/ACT IN AEPB: 1.0000 | INHALATION_SPRAY | Freq: Every day | RESPIRATORY_TRACT | 0 refills | Status: DC

## 2024-03-03 NOTE — Telephone Encounter (Signed)
 I called and spoke with pt. Pt stated he needs a refill on his Trelegy 200. I reviewed his chart and it looks like Dr Diania Fortes prescribes this. Pt was last seen on 03-14-2023. I informed the pt that I could send in 1 refill, but he has to make a follow up appointment so we can continue refills. Pt verbalized understanding. Pt clarified what pharmacy he wanted. Rx sent. NFN. I will route to the front office to make sure a f/u appointment is made. You do not need to route this back to triage afterwards. Once appointment is made, complete the note.

## 2024-03-03 NOTE — Telephone Encounter (Signed)
 Patient scheduled.

## 2024-03-03 NOTE — Telephone Encounter (Signed)
 Copied from CRM [id number]. Topic: Clinical - Medication Refill >> Mar 03, 2024 12:23 PM Hilton Lucky wrote: Medication: Fluticasone -Umeclidin-Vilant (TRELEGY ELLIPTA ) 200-62.5-25 MCG/ACT AEPB  Has the patient contacted their pharmacy? Yes - States pharmacy doesn't have it   This is the patient's preferred pharmacy:  [id number] [place], [place] - [address] AT [facility] OF [facility] [address] & [facility] [address] [place] [address] Phone: [phone] Fax: [phone]   Is this the correct pharmacy for this prescription? Yes If no, delete pharmacy and type the correct one.   Has the prescription been filled recently? No  Is the patient out of the medication? Yes  Has the patient been seen for an appointment in the last year OR does the patient have an upcoming appointment? Yes  Can we respond through MyChart? Yes  Agent: Please be advised that Rx refills may take up to 3 business days. We ask that you follow-up with your pharmacy.

## 2024-03-19 DIAGNOSIS — M1711 Unilateral primary osteoarthritis, right knee: Secondary | ICD-10-CM | POA: Diagnosis not present

## 2024-03-20 ENCOUNTER — Ambulatory Visit (HOSPITAL_COMMUNITY)
Admission: RE | Admit: 2024-03-20 | Discharge: 2024-03-20 | Disposition: A | Discharge status: Home / Self Care | Payer: Medicare Other | Source: Ambulatory Visit | Attending: Cardiology | Admitting: Cardiology

## 2024-03-20 DIAGNOSIS — I77819 Aortic ectasia, unspecified site: Secondary | ICD-10-CM

## 2024-03-20 DIAGNOSIS — I7121 Aneurysm of the ascending aorta, without rupture: Secondary | ICD-10-CM

## 2024-03-20 DIAGNOSIS — I351 Nonrheumatic aortic (valve) insufficiency: Secondary | ICD-10-CM | POA: Diagnosis not present

## 2024-03-20 LAB — ECHOCARDIOGRAM COMPLETE
AR max vel: 2.55 cm2
AV Area VTI: 2.22 cm2
AV Area mean vel: 2.45 cm2
AV Mean grad: 9.5 mmHg
AV Peak grad: 16.9 mmHg
Ao pk vel: 2.06 m/s
Area-P 1/2: 3.42 cm2
P 1/2 time: 514 ms
S' Lateral: 3.5 cm

## 2024-03-21 ENCOUNTER — Ambulatory Visit: Payer: Self-pay | Admitting: Cardiology

## 2024-03-30 ENCOUNTER — Ambulatory Visit: Attending: Cardiology | Admitting: Cardiology

## 2024-03-30 ENCOUNTER — Encounter: Payer: Self-pay | Admitting: Cardiology

## 2024-03-30 VITALS — HR 73 | Resp 16 | Ht 69.0 in | Wt 198.4 lb

## 2024-03-30 DIAGNOSIS — I351 Nonrheumatic aortic (valve) insufficiency: Secondary | ICD-10-CM | POA: Diagnosis not present

## 2024-03-30 DIAGNOSIS — I2510 Atherosclerotic heart disease of native coronary artery without angina pectoris: Secondary | ICD-10-CM

## 2024-03-30 DIAGNOSIS — J449 Chronic obstructive pulmonary disease, unspecified: Secondary | ICD-10-CM

## 2024-03-30 DIAGNOSIS — I480 Paroxysmal atrial fibrillation: Secondary | ICD-10-CM

## 2024-03-30 DIAGNOSIS — I7121 Aneurysm of the ascending aorta, without rupture: Secondary | ICD-10-CM | POA: Diagnosis not present

## 2024-03-30 NOTE — Progress Notes (Signed)
 Cardiology Office Note:  .   Date:  03/30/2024  ID:  Nicholas Camacho, DOB March 28, 1951, MRN [id number] PCP: Kip Righter, MD  [facility] Providers Cardiologist:  None    History of Present Illness: .   Nicholas Camacho is a 73 y.o. male Discussed the use of AI scribe software for clinical note transcription with the patient, who gave verbal consent to proceed.  History of Present Illness Nicholas Camacho is a 73 year old male with aortic dilation and mild aortic valve regurgitation who presents for follow-up.  He is being monitored for aortic dilation, currently measured at 44 millimeters, and mild aortic valve regurgitation. A repeat echocardiogram is scheduled in one year.  He experiences chronic shortness of breath, particularly when climbing stairs, which is attributed to his history of chronic obstructive pulmonary disease (COPD). He manages his COPD with two inhalers and notes variability in his symptoms, having both good and bad days. He is scheduled to see his pulmonologist next week.  He is on Eliquis  5 mg twice daily for atrial fibrillation and diltiazem  180 mg daily. Additionally, he takes atorvastatin  80 mg daily, with an LDL level of 40, and losartan  50 mg daily for blood pressure management. His blood pressure has stabilized after initial issues when starting his medications.  He recalls a transient ischemic attack (TIA) four years ago due to non-compliance with Eliquis , resulting in a 24-hour hospital observation. He also experienced severe anemia in the past, with a hemoglobin level of 5.5, requiring hospitalization. This was attributed to concurrent use of Xarelto  and a baby aspirin , which he has since discontinued. He now takes iron  supplements daily to manage his anemia. His current hemoglobin is 13.2, and creatinine is 1.03. He is diligent about taking his medications and managing his health conditions.     Studies Reviewed: SABRA   EKG  Interpretation Date/Time:  Monday March 30 2024 10:23:28 EDT Ventricular Rate:  70 PR Interval:  186 QRS Duration:  108 QT Interval:  410 QTC Calculation: 442 R Axis:   87  Text Interpretation: Normal sinus rhythm Normal ECG When compared with ECG of 01-Jun-2020 17:53, Premature atrial complexes are no longer Present Confirmed by Jeffrie Anes ([id number]) on 03/30/2024 10:34:11 AM    Results LABS LDL: 40 Hb: 13.2 (02/26/2024) Cr: 1.03  RADIOLOGY Carotid artery ultrasound: Stable, only mild disease  DIAGNOSTIC Echocardiogram: Aortic mildly dilated 44 mm, mild aortic valve regurgitation Cardiac PET scan: Low risk EKG: Sinus rhythm Risk Assessment/Calculations:           Physical Exam:   VS:  Pulse 73   Resp 16   Ht 5' 9 (1.753 m)   Wt 198 lb 6.4 oz (90 kg)   SpO2 90%   BMI 29.30 kg/m    Wt Readings from Last 3 Encounters:  03/30/24 198 lb 6.4 oz (90 kg)  08/19/23 207 lb 9.6 oz (94.2 kg)  03/14/23 209 lb (94.8 kg)    GEN: Well nourished, well developed in no acute distress NECK: No JVD; No carotid bruits CARDIAC: RRR, no murmurs, no rubs, no gallops RESPIRATORY:  Clear to auscultation without rales, wheezing or rhonchi  ABDOMEN: Soft, non-tender, non-distended EXTREMITIES:  No edema; No deformity   ASSESSMENT AND PLAN: .    Assessment and Plan Assessment & Plan Paroxysmal Atrial Fibrillation Currently in sinus rhythm on EKG. Managed with Eliquis  5 mg twice daily and diltiazem  180 mg daily. History of mini-stroke attributed to non-compliance with Eliquis .  Coronary Artery  Disease (CAD) Stable CAD. Cardiac PET scan indicates low risk. LDL cholesterol well-controlled at 40 on atorvastatin  80 mg daily.  Mild Aortic Valve Regurgitation Mild aortic valve regurgitation noted. No acute changes or interventions required at this time.  Mildly Dilated Aorta Aorta mildly dilated at 44 mm. Monitoring with echocardiogram planned for next year. - Schedule echocardiogram for  next year.  Chronic Obstructive Pulmonary Disease (COPD) Chronic shortness of breath, noticeable when climbing stairs. Managed with inhalers and medications. Follow-up with pulmonologist scheduled for next week. - Assist with obtaining a handicap form due to COPD.  Stroke Mini-stroke four years ago due to non-compliance with Eliquis . Currently on Eliquis  with no recent events.  Anemia Severe anemia (5.3) previously requiring hospitalization. Currently stable with hemoglobin at 13.2. Taking iron  supplements daily.         Dispo: 1 yr  Signed, Oneil Parchment, MD

## 2024-03-30 NOTE — Patient Instructions (Signed)
 Medication Instructions:  The current medical regimen is effective;  continue present plan and medications.  *If you need a refill on your cardiac medications before your next appointment, please call your pharmacy*  Follow-Up: At [facility], you and your health needs are our priority.  As part of our continuing mission to provide you with exceptional heart care, our providers are all part of one team.  This team includes your primary Cardiologist (physician) and Advanced Practice Providers or APPs (Physician Assistants and Nurse Practitioners) who all work together to provide you with the care you need, when you need it.  Your next appointment:   1 year(s)  Provider:   Glendia Ferrier, PA-C          We recommend signing up for the patient portal called MyChart.  Sign up information is provided on this After Visit Summary.  MyChart is used to connect with patients for Virtual Visits (Telemedicine).  Patients are able to view lab/test results, encounter notes, upcoming appointments, etc.  Non-urgent messages can be sent to your provider as well.   To learn more about what you can do with MyChart, go to [email or web address].

## 2024-04-02 ENCOUNTER — Other Ambulatory Visit: Payer: Self-pay | Admitting: Pulmonary Disease

## 2024-04-02 DIAGNOSIS — J4489 Other specified chronic obstructive pulmonary disease: Secondary | ICD-10-CM

## 2024-04-07 ENCOUNTER — Encounter: Payer: Self-pay | Admitting: Pulmonary Disease

## 2024-04-07 ENCOUNTER — Ambulatory Visit: Admitting: Pulmonary Disease

## 2024-04-07 VITALS — BP 136/82 | HR 86 | Ht 69.0 in | Wt 198.0 lb

## 2024-04-07 DIAGNOSIS — J449 Chronic obstructive pulmonary disease, unspecified: Secondary | ICD-10-CM

## 2024-04-07 DIAGNOSIS — J4489 Other specified chronic obstructive pulmonary disease: Secondary | ICD-10-CM | POA: Diagnosis not present

## 2024-04-07 DIAGNOSIS — G4734 Idiopathic sleep related nonobstructive alveolar hypoventilation: Secondary | ICD-10-CM

## 2024-04-07 DIAGNOSIS — G4736 Sleep related hypoventilation in conditions classified elsewhere: Secondary | ICD-10-CM | POA: Diagnosis not present

## 2024-04-07 DIAGNOSIS — Z87891 Personal history of nicotine dependence: Secondary | ICD-10-CM | POA: Diagnosis not present

## 2024-04-07 MED ORDER — AZITHROMYCIN 250 MG PO TABS: 250.0000 mg | ORAL_TABLET | Freq: Every day | ORAL | 5 refills | Status: DC

## 2024-04-07 MED ORDER — TRELEGY ELLIPTA 200-62.5-25 MCG/ACT IN AEPB: 1.0000 | INHALATION_SPRAY | Freq: Every day | RESPIRATORY_TRACT | 6 refills | Status: DC

## 2024-04-07 NOTE — Patient Instructions (Addendum)
 Continue nebulizer treatments followed by flutter valve to help clear out mucous from your airways  Continue trelegy ellipta  1 puff daily  Continue fasenra  injections  Continue azithromycin  daily  Follow up in 6 months

## 2024-04-07 NOTE — Progress Notes (Signed)
 Synopsis: Referred in 2014 for COPD by Kip Righter, MD.  Formerly a patient of Dr. Alaine and Dr. Gretta.  Subjective:   PATIENT ID: Nicholas Camacho DOB: 09-19-1951, MRN: [id number]  HPI  Chief Complaint  Patient presents with   Follow-up    Pt states    Nicholas Camacho is a 73 year old Camacho, former smoker with asthma-COPD overlap syndrome and nocturnal hypoxemia who returns to clinic for follow up.  He experiences coughing fits, particularly at night when lying down and after exertion, such as climbing stairs or doing yard work. These episodes produce greenish-tinted mucus. He uses oxygen  after exertion, especially after climbing stairs, to aid breathing.  His current medications include Fasenra , Trelegy, azithromycin , and he uses a nebulizer three times daily. He has not required prednisone  recently. He uses a flutter valve to help clear mucus, though not as frequently as recommended.  A low dose CT scan is scheduled for August to monitor previously identified spots. He experiences significant physical limitations due to his respiratory condition, requiring frequent breaks during activities like yard work.   OV 03/14/23 He continues to have significant exertional dyspnea. He reports severe coughing episodes. He has intermittent reflux. The cough has increased of recent. Recently completed steroid course for his knees.  OV 12/17/22 He was seen in video visit 10/04/21 and treated for COPD exacerbation. He was seen 11/16/22 by Lauraine Lites for lung nodule and review of PET scan. His PET scan shows no hypermetabolism in the RML nodule of concern. He was treated for COPD exacerbation at that visit as well.   He still has chest congestion and mucous production. He has sinus congestion and post nasal drainage. He is using fluticasone  and atrovent  nasal sprays. He was taking allegra-D which has helped.   OV 05/04/22 LCS CT Chest last month showed LLL with similar tree in bud  nodularity and new LUL nodule measuring 4.69mm with recommended 6 month follow up.  He is doing well over all. Continues on trelegy daily and fasenra  injections. He is requesting refill on duonebs.   PFTs are stable since 2021 on repeat today.  OV 12/26/21 He reports doing well since starting fasenra . He has only required prednisone  once since starting fasenra . He denies any acute issues at this time. He continues on trelegy ellipta  daily and singulair .   OV 06/30/21 He started Fasenra  injections in July and has had 3 shots so far.  He has not required prednisone  or antibiotics since starting Fasenra .  He reports that his wife has noted an improvement in his cough and breathing.  He does not report significant changes that he has noticed at this time.  He does continue to have a cough with green sputum.  He continues to use Trelegy Ellipta  1 puff daily.  He is also using DuoNeb nebulizer treatments along with flutter valve for mucus clearance.  He continues to take montelukast  daily as well.  His sinus congestion and drainage is maintained using Zyrtec, fluticasone  and ipratropium nasal sprays.  He has received his influenza vaccine as well as COVID booster recently.  Past Medical History:  Diagnosis Date   COPD (chronic obstructive pulmonary disease) (HCC)    Disturbance of skin sensation 06/24/2013   Dysrhythmia    Elevated cholesterol    History of echocardiogram    Echo 4/17: EF 60-65%, no RWMA, Gr 2 DD   Hypertension    Sleep disturbance, unspecified 06/24/2013     Family History  Problem  Relation Age of Onset   Diabetes Mother    Prostate cancer Father    Kidney failure Father    Alcoholism Sister        history of   Drug abuse Sister        history of   Stroke Neg Hx      Social History   Socioeconomic History   Marital status: Married    Spouse name: Not on file   Number of children: 2   Years of education: COLLEGE   Highest education level: Not on file   Occupational History   Occupation: retired  Tobacco Use   Smoking status: Former    Current packs/day: 0.00    Average packs/day: 1.5 packs/day for 45.0 years (67.5 ttl pk-yrs)    Types: Cigarettes    Start date: 11/18/1970    Quit date: 11/19/2015    Years since quitting: 8.3   Smokeless tobacco: Never   Tobacco comments:    pt quit smoking!! 11/19/15  Vaping Use   Vaping status: Never Used  Substance and Sexual Activity   Alcohol use: No    Alcohol/week: 0.0 standard drinks of alcohol   Drug use: No   Sexual activity: Not on file  Other Topics Concern   Not on file  Social History Narrative   Not on file   Social Drivers of Health   Financial Resource Strain: Not on file  Food Insecurity: Not on file  Transportation Needs: Not on file  Physical Activity: Not on file  Stress: Not on file  Social Connections: Not on file  Intimate Partner Violence: Not on file     Allergies  Allergen Reactions   Morphine Other (See Comments)   Morphine And Codeine     HALLUCINATIONS     Outpatient Medications Prior to Visit  Medication Sig Dispense Refill   atorvastatin  (LIPITOR ) 80 MG tablet Take 1 tablet (80 mg total) by mouth daily. 90 tablet 3   benralizumab  (FASENRA  PEN) 30 MG/ML prefilled autoinjector Inject 1 mL (30 mg total) into the skin every 8 (eight) weeks. 1 mL 4   Cyanocobalamin  1000 MCG CAPS Take 1,000 mcg by mouth daily.     diltiazem  (CARDIZEM  CD) 180 MG 24 hr capsule Take 1 capsule (180 mg total) by mouth daily. 90 capsule 1   ELIQUIS  5 MG TABS tablet TAKE 1 TABLET BY MOUTH TWICE DAILY 60 tablet 5   fluticasone  (FLONASE ) 50 MCG/ACT nasal spray SHAKE LIQUID AND USE 2 SPRAYS IN EACH NOSTRIL TWICE DAILY 16 g 3   ipratropium (ATROVENT ) 0.03 % nasal spray Place 2 sprays into both nostrils every 12 (twelve) hours. 30 mL 12   ipratropium-albuterol  (DUONEB) 0.5-2.5 (3) MG/3ML SOLN USE 1 VIAL VIA NEBULIZER THREE TIMES DAILY 360 mL 6   levothyroxine (SYNTHROID) 50 MCG  tablet Take 50 mcg by mouth every morning.     losartan  (COZAAR ) 50 MG tablet TAKE 1 TABLET(50 MG) BY MOUTH DAILY 90 tablet 2   montelukast  (SINGULAIR ) 10 MG tablet Take 1 tablet (10 mg total) by mouth at bedtime. 30 tablet 11   nitroGLYCERIN  (NITROSTAT ) 0.4 MG SL tablet Place 1 tablet under the tongue every 5 (five) minutes as needed for chest pain.     Respiratory Therapy Supplies (FLUTTER) DEVI 1 Device by Does not apply route in the morning and at bedtime. 1 each 0   VENTOLIN  HFA 108 (90 Base) MCG/ACT inhaler INHALE 2 PUFFS BY MOUTH EVERY 6 HOURS AS NEEDED FOR WHEEZING  OR SHORTNESS OF BREATH 18 g 12   azithromycin  (ZITHROMAX ) 250 MG tablet TAKE 1 TABLET BY MOUTH DAILY 30 tablet 5   TRELEGY ELLIPTA  200-62.5-25 MCG/ACT AEPB INHALE 1 PUFF INTO THE LUNGS DAILY 60 each 0   No facility-administered medications prior to visit.   Review of Systems  Constitutional:  Negative for chills, fever, malaise/fatigue and weight loss.  HENT:  Negative for congestion, sinus pain and sore throat.   Eyes: Negative.   Respiratory:  Positive for cough, sputum production and wheezing. Negative for hemoptysis and shortness of breath.   Cardiovascular:  Negative for chest pain, palpitations, orthopnea, claudication and leg swelling.  Gastrointestinal:  Negative for abdominal pain, heartburn, nausea and vomiting.  Genitourinary: Negative.   Musculoskeletal:  Negative for joint pain and myalgias.  Skin:  Negative for rash.  Neurological:  Negative for weakness.  Endo/Heme/Allergies: Negative.   Psychiatric/Behavioral: Negative.     Objective:   Vitals:   04/07/24 1313  BP: 136/82  Pulse: 86  SpO2: 92%  Weight: 198 lb (89.8 kg)  Height: 5' 9 (1.753 m)    Physical Exam Constitutional:      General: He is not in acute distress. HENT:     Head: Normocephalic and atraumatic.  Eyes:     Conjunctiva/sclera: Conjunctivae normal.  Cardiovascular:     Rate and Rhythm: Normal rate and regular rhythm.      Pulses: Normal pulses.     Heart sounds: Normal heart sounds. No murmur heard. Pulmonary:     Effort: Pulmonary effort is normal.     Breath sounds: No wheezing, rhonchi or rales.  Musculoskeletal:     Right lower leg: No edema.     Left lower leg: No edema.  Skin:    General: Skin is warm and dry.  Neurological:     General: No focal deficit present.     Mental Status: He is alert.    CBC    Component Value Date/Time   WBC 7.7 01/24/2021 1243   RBC 4.94 01/24/2021 1243   HGB 13.7 01/24/2021 1243   HGB 14.7 11/23/2019 1156   HCT 41.9 01/24/2021 1243   HCT 43.8 11/23/2019 1156   PLT 251.0 01/24/2021 1243   PLT 373 11/23/2019 1156   MCV 84.9 01/24/2021 1243   MCV 83 11/23/2019 1156   MCH 27.2 06/01/2020 1807   MCHC 32.6 01/24/2021 1243   RDW 16.0 (H) 01/24/2021 1243   RDW 13.2 11/23/2019 1156   LYMPHSABS 1.3 01/24/2021 1243   LYMPHSABS 1.7 11/23/2019 1156   MONOABS 0.7 01/24/2021 1243   EOSABS 0.2 01/24/2021 1243   EOSABS 0.3 11/23/2019 1156   BASOSABS 0.0 01/24/2021 1243   BASOSABS 0.0 11/23/2019 1156      Latest Ref Rng & Units 04/17/2021   12:01 PM 06/01/2020    6:07 PM 11/23/2019   11:56 AM  BMP  Glucose 65 - 99 mg/dL 842  863  867   BUN 8 - 27 mg/dL 19  12  13    Creatinine 0.76 - 1.27 mg/dL 8.96  8.97  8.86   BUN/Creat Ratio 10 - 24 18   12    Sodium 134 - 144 mmol/L 140  139  140   Potassium 3.5 - 5.2 mmol/L 4.3  4.2  4.3   Chloride 96 - 106 mmol/L 101  104  104   CO2 20 - 29 mmol/L 23  26  21    Calcium  8.6 - 10.2 mg/dL 9.7  9.5  9.3  Chest imaging: CT Chest LCS 05/13/23 1. Lung-RADS 2, benign appearance or behavior. Continue annual screening with low-dose chest CT without contrast in 12 months. 2. Cholelithiasis. 3. Chronic calcific pancreatitis. 4. Aortic atherosclerosis (ICD10-I70.0). Coronary artery calcification. 5.  Emphysema (ICD10-J43.9).  LCS CT Chest 04/23/22 Mediastinum/Nodes: No mediastinal or definite hilar adenopathy, given limitations  of unenhanced CT.   Lungs/Pleura: No pleural fluid. Mild centrilobular and paraseptal emphysema. Lower lobe predominant bronchial wall thickening. Again identified is relatively diffuse tree-in-bud micronodularity. This is primarily similar, with mild increase in the right lower lobe.   A left upper lobe larger pulmonary nodule of volume derived equivalent diameter 4.5 mm on image 53 is new.  CT Chest 12/07/20 Lung-RADS 2, benign appearance or behavior. Continue annual screening with low-dose chest CT without contrast in 12 months.   Stable tree-in-bud nodularity in the upper lobes likely reflect chronic atypical mycobacterial infection or post infectious inflammatory scarring.   Aortic Atherosclerosis (ICD10-I70.0) and Emphysema (ICD10-J43.9).  PFT:    Latest Ref Rng & Units 05/04/2022   10:30 AM 11/18/2019    2:52 PM 10/23/2013   11:50 AM  PFT Results  FVC-Pre L 3.09  3.12  4.11   FVC-Predicted Pre % 75  74  93   FVC-Post L  2.85  4.12   FVC-Predicted Post %  67  94   Pre FEV1/FVC % % 51  53  58   Post FEV1/FCV % %  54  58   FEV1-Pre L 1.58  1.64  2.38   FEV1-Predicted Pre % 52  52  72   FEV1-Post L  1.54  2.41   DLCO uncorrected ml/min/mmHg  19.63  21.40   DLCO UNC% %  79  72   DLVA Predicted %  99  72   TLC L  7.38  8.64   TLC % Predicted %  111  130   RV % Predicted %  187  206     2021: Moderate obstruction without bronchodilator reversibility. Significant air trapping without hyperinflation.  Mildly reduced diffusion.   2015: Moderate obstruction with hyperinflation, no significant bronchodilator reversibility.  Mild diffusion impairment.  Echo 06/02/20: 1. Left ventricular ejection fraction, by estimation, is 55 to 60%. The  left ventricle has normal function. The left ventricle has no regional  wall motion abnormalities. Left ventricular diastolic parameters are  consistent with Grade I diastolic  dysfunction (impaired relaxation).   2. Right ventricular  systolic function is normal. The right ventricular  size is normal.   3. The mitral valve is normal in structure. Trivial mitral valve  regurgitation. No evidence of mitral stenosis.   4. The aortic valve was not well visualized. Aortic valve regurgitation  is mild. Mild aortic valve sclerosis is present, with no evidence of  aortic valve stenosis.   5. The inferior vena cava is normal in size with greater than 50%  respiratory variability, suggesting right atrial pressure of 3 mmHg.   Heart Catheterization 08/30/2017: The left ventricular systolic function is normal. LV end diastolic pressure is normal. The left ventricular ejection fraction is 55-65% by visual estimate. There is no mitral valve regurgitation. Prox RCA lesion is 40% stenosed. Prox RCA to Mid RCA lesion is 50% stenosed. Mid RCA-1 lesion is 30% stenosed. Mid RCA-2 lesion is 50% stenosed. Prox Cx lesion is 40% stenosed. Ost LAD to Prox LAD lesion is 30% stenosed.   1. Moderate non-obstructive, calcific stenoses in the RCA 2. Mild non-obstructive, calcific stenoses in the LAD  and Circumflex 3. Normal LV systolic function   Recommendations: Medical management of CAD. Tobacco cessation.   Assessment & Plan:   Asthma-COPD overlap syndrome (HCC) - Plan: Fluticasone -Umeclidin-Vilant (TRELEGY ELLIPTA ) 200-62.5-25 MCG/ACT AEPB  Nocturnal hypoxemia  Chronic obstructive pulmonary disease, unspecified COPD type (HCC) - Plan: azithromycin  (ZITHROMAX ) 250 MG tablet  Discussion: Nicholas Camacho is a 73 year old Camacho, former smoker with asthma-COPD overlap syndrome and nocturnal hypoxemia who returns to clinic for follow up.   He is to continue trelegy ellipta  1 puff daily, azithromycin  250mg  daily and fasenra  injections every 2 months. EKG 03/30/24 shows normal Qtc.   He is to use nebulizer treatments twice daily follow by flutter valve therapy for airway clearance.   Continue montelukast  and allegra daily.  He  is being followed with our lung cancer screening program.  He is to continue Flonase  nasal spray 2 sprays per nostril each day for nasal congestion and he can use ipratropium nasal spray as needed.  He is to follow up in 6 months.  Dorn Chill, MD [facility] Office: [phone]    Current Outpatient Medications:    atorvastatin  (LIPITOR ) 80 MG tablet, Take 1 tablet (80 mg total) by mouth daily., Disp: 90 tablet, Rfl: 3   azithromycin  (ZITHROMAX ) 250 MG tablet, Take 1 tablet (250 mg total) by mouth daily., Disp: 30 tablet, Rfl: 5   benralizumab  (FASENRA  PEN) 30 MG/ML prefilled autoinjector, Inject 1 mL (30 mg total) into the skin every 8 (eight) weeks., Disp: 1 mL, Rfl: 4   Cyanocobalamin  1000 MCG CAPS, Take 1,000 mcg by mouth daily., Disp: , Rfl:    diltiazem  (CARDIZEM  CD) 180 MG 24 hr capsule, Take 1 capsule (180 mg total) by mouth daily., Disp: 90 capsule, Rfl: 1   ELIQUIS  5 MG TABS tablet, TAKE 1 TABLET BY MOUTH TWICE DAILY, Disp: 60 tablet, Rfl: 5   fluticasone  (FLONASE ) 50 MCG/ACT nasal spray, SHAKE LIQUID AND USE 2 SPRAYS IN EACH NOSTRIL TWICE DAILY, Disp: 16 g, Rfl: 3   Fluticasone -Umeclidin-Vilant (TRELEGY ELLIPTA ) 200-62.5-25 MCG/ACT AEPB, Inhale 1 puff into the lungs daily., Disp: 60 each, Rfl: 6   ipratropium (ATROVENT ) 0.03 % nasal spray, Place 2 sprays into both nostrils every 12 (twelve) hours., Disp: 30 mL, Rfl: 12   ipratropium-albuterol  (DUONEB) 0.5-2.5 (3) MG/3ML SOLN, USE 1 VIAL VIA NEBULIZER THREE TIMES DAILY, Disp: 360 mL, Rfl: 6   levothyroxine (SYNTHROID) 50 MCG tablet, Take 50 mcg by mouth every morning., Disp: , Rfl:    losartan  (COZAAR ) 50 MG tablet, TAKE 1 TABLET(50 MG) BY MOUTH DAILY, Disp: 90 tablet, Rfl: 2   montelukast  (SINGULAIR ) 10 MG tablet, Take 1 tablet (10 mg total) by mouth at bedtime., Disp: 30 tablet, Rfl: 11   nitroGLYCERIN  (NITROSTAT ) 0.4 MG SL tablet, Place 1 tablet under the tongue every 5 (five) minutes as needed for  chest pain., Disp: , Rfl:    Respiratory Therapy Supplies (FLUTTER) DEVI, 1 Device by Does not apply route in the morning and at bedtime., Disp: 1 each, Rfl: 0   VENTOLIN  HFA 108 (90 Base) MCG/ACT inhaler, INHALE 2 PUFFS BY MOUTH EVERY 6 HOURS AS NEEDED FOR WHEEZING OR SHORTNESS OF BREATH, Disp: 18 g, Rfl: 12

## 2024-04-13 DIAGNOSIS — M1711 Unilateral primary osteoarthritis, right knee: Secondary | ICD-10-CM | POA: Diagnosis not present

## 2024-04-20 ENCOUNTER — Other Ambulatory Visit: Payer: Self-pay

## 2024-04-20 DIAGNOSIS — M1711 Unilateral primary osteoarthritis, right knee: Secondary | ICD-10-CM | POA: Diagnosis not present

## 2024-04-20 MED ORDER — ATORVASTATIN CALCIUM 80 MG PO TABS
80.0000 mg | ORAL_TABLET | Freq: Every day | ORAL | 3 refills | Status: AC
Start: 2024-04-20 — End: ?

## 2024-04-27 DIAGNOSIS — M1711 Unilateral primary osteoarthritis, right knee: Secondary | ICD-10-CM | POA: Diagnosis not present

## 2024-05-02 ENCOUNTER — Other Ambulatory Visit: Payer: Self-pay | Admitting: Pulmonary Disease

## 2024-05-02 DIAGNOSIS — J4489 Other specified chronic obstructive pulmonary disease: Secondary | ICD-10-CM

## 2024-05-11 ENCOUNTER — Other Ambulatory Visit: Payer: Self-pay | Admitting: Pharmacist

## 2024-05-11 DIAGNOSIS — J4489 Other specified chronic obstructive pulmonary disease: Secondary | ICD-10-CM

## 2024-05-11 MED ORDER — FASENRA PEN 30 MG/ML ~~LOC~~ SOAJ: 30.0000 mg | SUBCUTANEOUS | 5 refills | Status: DC

## 2024-05-11 MED ORDER — FASENRA PEN 30 MG/ML ~~LOC~~ SOAJ
30.0000 mg | SUBCUTANEOUS | 2 refills | Status: AC
Start: 2024-05-11 — End: ?

## 2024-05-11 NOTE — Telephone Encounter (Signed)
 Refill sent for FASENRA  to [facility]  Dose: 30mg  subcut every 8 weeks  Last OV: 04/07/2024 Provider: Dr. Kara  Next OV: due in 6 months  Sherry Pennant, PharmD, MPH, BCPS Clinical Pharmacist (Rheumatology and Pulmonology)

## 2024-05-15 ENCOUNTER — Ambulatory Visit (HOSPITAL_COMMUNITY)
Admission: RE | Admit: 2024-05-15 | Discharge: 2024-05-15 | Disposition: A | Discharge status: Home / Self Care | Source: Ambulatory Visit | Attending: Family Medicine | Admitting: Family Medicine

## 2024-05-15 DIAGNOSIS — Z87891 Personal history of nicotine dependence: Secondary | ICD-10-CM | POA: Diagnosis not present

## 2024-05-15 DIAGNOSIS — Z122 Encounter for screening for malignant neoplasm of respiratory organs: Secondary | ICD-10-CM | POA: Insufficient documentation

## 2024-05-19 DIAGNOSIS — I4891 Unspecified atrial fibrillation: Secondary | ICD-10-CM | POA: Diagnosis not present

## 2024-05-19 DIAGNOSIS — I10 Essential (primary) hypertension: Secondary | ICD-10-CM | POA: Diagnosis not present

## 2024-05-19 DIAGNOSIS — E538 Deficiency of other specified B group vitamins: Secondary | ICD-10-CM | POA: Diagnosis not present

## 2024-05-19 DIAGNOSIS — E785 Hyperlipidemia, unspecified: Secondary | ICD-10-CM | POA: Diagnosis not present

## 2024-05-19 DIAGNOSIS — E1169 Type 2 diabetes mellitus with other specified complication: Secondary | ICD-10-CM | POA: Diagnosis not present

## 2024-05-19 DIAGNOSIS — Z0000 Encounter for general adult medical examination without abnormal findings: Secondary | ICD-10-CM | POA: Diagnosis not present

## 2024-06-02 ENCOUNTER — Telehealth: Payer: Self-pay | Admitting: *Deleted

## 2024-06-02 DIAGNOSIS — R911 Solitary pulmonary nodule: Secondary | ICD-10-CM

## 2024-06-02 NOTE — Telephone Encounter (Signed)
 Call report from [facility]:  IMPRESSION: 1. Lung-RADS 4B, suspicious. Additional imaging evaluation or consultation with Pulmonology or Thoracic Surgery recommended. Extensive peribronchovascular and centrilobular nodularity throughout the right lower lobe, substantially worsened from prior chest CT with new dominant 22 mm right lower lobe nodule. The right lower lobe airways are largely replaced by dense patchy material. Similar chronic mild tree-in-bud opacities in the left upper and left lower lobes. Findings may represent acute worsening of chronic/recurrent infectious bronchiolitis such as due to atypical mycobacterial infection or recurrent aspiration, however primary bronchogenic malignancy cannot be excluded in the right lower lobe. Pulmonology consultation advised. Suggest short-term follow-up noncontrast chest CT in 3 months at a minimum. 2. Three-vessel coronary atherosclerosis. 3. Cholelithiasis. 4. Chronic calcific pancreatitis. 5. Aortic Atherosclerosis (ICD10-I70.0) and Emphysema (ICD10-J43.9).

## 2024-06-05 NOTE — Telephone Encounter (Signed)
 Patient called back in and left VM. Called patient back. Spoke with pt. Informed him of the results of his most recent LDCT. Patient denies any hemoptysis and significant weight loss. Patient states over the last 6 months he has lost about 5-7 pounds. Patient has been scheduled for 3 month follow up scan for 08/17/2024 at [facility]. Results and recommendations sent to PCP.

## 2024-06-05 NOTE — Telephone Encounter (Signed)
 This patient has a pretty complicated history regarding multiple abnormal scans.  He had a PET scan as recently as last year.  Looking at this scan they again suspect infectious or inflammatory etiology.  Plan will be for a 37-month follow-up to reevaluate.  This will be due about November 15 or after. Please ask him if he has had any unintentional weight loss or hemoptysis.  If he has them rather than wait for 38-month follow-up we will do a PET. If he has not we will do the 84-month follow-up scan  Please see results of the scan below Lung-RADS 4B, suspicious. Additional imaging evaluation or consultation with Pulmonology or Thoracic Surgery recommended. Extensive peribronchovascular and centrilobular nodularity throughout the right lower lobe, substantially worsened from prior chest CT with new dominant 22 mm right lower lobe nodule. The right lower lobe airways are largely replaced by dense patchy material. Similar chronic mild tree-in-bud opacities in the left upper and left lower lobes. Findings may represent acute worsening of chronic/recurrent infectious bronchiolitis such as due to atypical mycobacterial infection or recurrent aspiration, however primary bronchogenic malignancy cannot be excluded in the right lower lobe. Pulmonology consultation advised. Suggest short-term follow-up noncontrast chest CT in 3 months at a minimum.  Please let PCP know plan for follow-up.  Thank so much

## 2024-06-05 NOTE — Telephone Encounter (Signed)
 Called and left VM for pt

## 2024-06-18 ENCOUNTER — Other Ambulatory Visit: Payer: Self-pay | Admitting: Pulmonary Disease

## 2024-06-18 DIAGNOSIS — J449 Chronic obstructive pulmonary disease, unspecified: Secondary | ICD-10-CM

## 2024-07-30 ENCOUNTER — Other Ambulatory Visit: Payer: Self-pay | Admitting: Cardiology

## 2024-07-30 ENCOUNTER — Other Ambulatory Visit: Payer: Self-pay | Admitting: Physician Assistant

## 2024-07-30 DIAGNOSIS — I480 Paroxysmal atrial fibrillation: Secondary | ICD-10-CM

## 2024-07-30 DIAGNOSIS — I10 Essential (primary) hypertension: Secondary | ICD-10-CM

## 2024-07-30 NOTE — Telephone Encounter (Signed)
 Prescription refill request for Eliquis  received. Indication:afib Last office visit:6/25 Scr:1.11  2025 Age: 73 Weight:89.8  kg  Prescription refilled

## 2024-08-10 ENCOUNTER — Telehealth: Payer: Self-pay

## 2024-08-10 ENCOUNTER — Other Ambulatory Visit (HOSPITAL_COMMUNITY): Payer: Self-pay

## 2024-08-10 NOTE — Telephone Encounter (Signed)
 PA renewal initiated automatically by CoverMyMeds.  Submitted a Prior Authorization request to [facility] for FASENRA  via CoverMyMeds. Will update once we receive a response.  Key: [id number]

## 2024-08-10 NOTE — Telephone Encounter (Signed)
 Received notification from [facility] regarding a prior authorization for FASENRA . Authorization has been APPROVED from 08/10/24 to 08/10/25. Have not yet received approval letter.  Per test claim, copay for 56 days supply is $0  Authorization # [id number] Phone # [phone]

## 2024-08-17 ENCOUNTER — Ambulatory Visit (HOSPITAL_COMMUNITY)
Admission: RE | Admit: 2024-08-17 | Discharge: 2024-08-17 | Disposition: A | Discharge status: Home / Self Care | Source: Ambulatory Visit | Attending: Acute Care | Admitting: Acute Care

## 2024-08-17 DIAGNOSIS — J432 Centrilobular emphysema: Secondary | ICD-10-CM | POA: Diagnosis not present

## 2024-08-17 DIAGNOSIS — R911 Solitary pulmonary nodule: Secondary | ICD-10-CM | POA: Insufficient documentation

## 2024-08-17 DIAGNOSIS — R918 Other nonspecific abnormal finding of lung field: Secondary | ICD-10-CM | POA: Diagnosis not present

## 2024-08-17 MED ORDER — IOHEXOL 9 MG/ML PO SOLN
ORAL | Status: AC
Start: 2024-08-17 — End: 2024-08-17
  Filled 2024-08-17: qty 1000

## 2024-08-20 ENCOUNTER — Other Ambulatory Visit: Payer: Self-pay | Admitting: Pulmonary Disease

## 2024-08-31 ENCOUNTER — Telehealth: Payer: Self-pay | Admitting: *Deleted

## 2024-08-31 ENCOUNTER — Telehealth: Payer: Self-pay | Admitting: Acute Care

## 2024-08-31 NOTE — Telephone Encounter (Signed)
 See other telephone note from 08/31/24

## 2024-08-31 NOTE — Telephone Encounter (Signed)
Results/ plans faxed to PCP.  

## 2024-08-31 NOTE — Telephone Encounter (Signed)
 Call report from [facility]:  IMPRESSION: 1. Right lower lobe mass crosses the right major fissure and is markedly enlarged from 05/15/2024. Lung-RADS 4X, highly suspicious. Additional imaging evaluation or consultation with Pulmonology or Thoracic Surgery recommended. These results will be called to the ordering clinician or representative by the Radiologist Assistant, and communication documented in the PACS or [employer]. 2. Obstruction of the superior segmental right lower lobe bronchus with endobronchial debris in the basal segmental bronchi and endobronchial nodularity, findings which may be postobstructive in etiology. Lymphangitic carcinomatosis cannot be definitively excluded. 3. Chronic calcific pancreatitis. 4. Aortic atherosclerosis (ICD10-I70.0). Coronary artery calcification. 5.  Emphysema (ICD10-J43.9).

## 2024-08-31 NOTE — Telephone Encounter (Signed)
 4X scan. This was a 4 B in 05/2024, and has increased in size from 18.2 mm to 66.8 mm. I have called him and we have reviewed this result. The rapid growth in 3 months . From 05/15/2024-08/17/2024 is very concerning. We discussed that he needs to be seen by one of the Pulmonary Internationalists to discuss biopsy. He is in agreement with the plan. He is scheduled with Dr. Kara in the [facility] office tomorrow 09/01/2024 at 11:15 am to further evaluate the finding. Pt. Is in agreement with the plan, and verbalized understanding of the above.

## 2024-09-01 ENCOUNTER — Encounter: Payer: Self-pay | Admitting: Pulmonary Disease

## 2024-09-01 ENCOUNTER — Ambulatory Visit: Admitting: Pulmonary Disease

## 2024-09-01 ENCOUNTER — Telehealth: Payer: Self-pay | Admitting: Pulmonary Disease

## 2024-09-01 VITALS — BP 131/74 | HR 82 | Ht 70.0 in | Wt 185.0 lb

## 2024-09-01 DIAGNOSIS — Z87891 Personal history of nicotine dependence: Secondary | ICD-10-CM

## 2024-09-01 DIAGNOSIS — R918 Other nonspecific abnormal finding of lung field: Secondary | ICD-10-CM | POA: Insufficient documentation

## 2024-09-01 DIAGNOSIS — J4489 Other specified chronic obstructive pulmonary disease: Secondary | ICD-10-CM

## 2024-09-01 DIAGNOSIS — C3431 Malignant neoplasm of lower lobe, right bronchus or lung: Secondary | ICD-10-CM | POA: Insufficient documentation

## 2024-09-01 NOTE — Patient Instructions (Addendum)
 Continue nebulizer treatments followed by flutter valve to help clear out mucous from your airways  Continue trelegy ellipta  1 puff daily  Continue fasenra  injections  Continue azithromycin  daily  We will get you scheduled for bronchoscopy on 12/12  Follow up after procedure with Lauraine Lites, NP on 12/18 to review the results

## 2024-09-01 NOTE — Telephone Encounter (Signed)
 Please schedule the following:  Provider performing procedure: Dr. Kara Diagnosis: Right Lower lobe Lung Mass Which side if for nodule / mass? Right Procedure: Robotic Assisted Navigational Bronchoscopy with Biopsies  Has patient been spoken to by Provider and given informed consent? yes Anesthesia: general Do you need Fluro? yes Duration of procedure: 1.5 hour Date: 12/12 Alternate Date: earliest available  Time: anytime Location: [facility] Does patient have OSA? no DM? no Or Latex allergy? no Medication Restriction/ Anticoagulate/Antiplatelet: hold eliquis  2 days prior to procedure Pre-op Labs Ordered:determined by Anesthesia Imaging request: n/a  (If, SuperDimension CT Chest, please have STAT courier sent to ENDO)   Thanks, Dorn Kara, MD [facility] Office: [phone]   See Amion for personal pager PCCM on call pager [phone] until 7pm. Please call Elink 7p-7a. [phone]

## 2024-09-01 NOTE — H&P (View-Only) (Signed)
 Established Patient Pulmonology Office Visit   Subjective:  Patient ID: Nicholas Camacho, male    DOB: November 04, 1950  MRN: [id number]  CC:  Chief Complaint  Patient presents with   Medical Management of Chronic Issues    Pt states no new concerns     Discussed the use of AI scribe software for clinical note transcription with the patient, who gave verbal consent to proceed.  History of Present Illness Nicholas Camacho is a 73 year old male, former smoker with asthma-COPD overlap syndrome and nocturnal hypoxemia who returns for follow-up on CT scan results.  A CT scan in August 2025 showed Extensive peribronchovascular and centrilobular nodularity throughout the right lower lobe, substantially worsened from prior chest CT with new dominant 22 mm right lower lobe nodule on image 195. The right lower lobe airways are largely replaced by dense patchy material. On 08/17/24  CT Chest is showing Masslike consolidation in the superior segment right lower lobe measures at least 66.8 mm, increased from 18.2 mm, and appears to cross the right major fissure, into the right middle lobe. Debris is seen in the right lower lobe bronchus with endobronchial debris in the basal segmental bronchi. Extensive centrilobular nodularity in the right lower lobe.  He has had weight loss since July.  He is on azithromycin  250mg  daily, fasenra  injections, montelukast  and trelegy for his COPD-asthma.  His cough has improved and he sleeps through the night without using a machine.  He takes Eliquis  twice a day and had a stroke in 2021 when he missed doses. He was in normal sinus rhythm in June 2025.  He has a smoking history. He denies feeling unwell or having specific new symptoms.        ROS    Current Outpatient Medications:    atorvastatin  (LIPITOR ) 80 MG tablet, Take 1 tablet (80 mg total) by mouth daily., Disp: 90 tablet, Rfl: 3   azithromycin  (ZITHROMAX ) 250 MG tablet, TAKE 1 TABLET BY MOUTH  DAILY, Disp: 30 tablet, Rfl: 5   benralizumab  (FASENRA  PEN) 30 MG/ML prefilled autoinjector, Inject 1 mL (30 mg total) into the skin every 8 (eight) weeks., Disp: 1 mL, Rfl: 2   Cyanocobalamin  1000 MCG CAPS, Take 1,000 mcg by mouth daily., Disp: , Rfl:    diltiazem  (CARDIZEM  CD) 180 MG 24 hr capsule, TAKE 1 CAPSULE(180 MG) BY MOUTH DAILY, Disp: 90 capsule, Rfl: 2   ELIQUIS  5 MG TABS tablet, TAKE 1 TABLET BY MOUTH TWICE DAILY, Disp: 60 tablet, Rfl: 5   fluticasone  (FLONASE ) 50 MCG/ACT nasal spray, SHAKE LIQUID AND USE 2 SPRAYS IN EACH NOSTRIL TWICE DAILY, Disp: 16 g, Rfl: 3   ipratropium (ATROVENT ) 0.03 % nasal spray, Place 2 sprays into both nostrils every 12 (twelve) hours., Disp: 30 mL, Rfl: 12   ipratropium-albuterol  (DUONEB) 0.5-2.5 (3) MG/3ML SOLN, USE 1 VIAL VIA NEBULIZER THREE TIMES DAILY, Disp: 360 mL, Rfl: 6   levothyroxine (SYNTHROID) 50 MCG tablet, Take 50 mcg by mouth every morning., Disp: , Rfl:    losartan  (COZAAR ) 50 MG tablet, TAKE 1 TABLET(50 MG) BY MOUTH DAILY, Disp: 90 tablet, Rfl: 2   montelukast  (SINGULAIR ) 10 MG tablet, Take 1 tablet (10 mg total) by mouth at bedtime., Disp: 30 tablet, Rfl: 11   nitroGLYCERIN  (NITROSTAT ) 0.4 MG SL tablet, Place 1 tablet under the tongue every 5 (five) minutes as needed for chest pain., Disp: , Rfl:    Respiratory Therapy Supplies (FLUTTER) DEVI, 1 Device by Does not apply route  in the morning and at bedtime., Disp: 1 each, Rfl: 0   TRELEGY ELLIPTA  200-62.5-25 MCG/ACT AEPB, INHALE 1 PUFF INTO THE LUNGS DAILY, Disp: 60 each, Rfl: 6   VENTOLIN  HFA 108 (90 Base) MCG/ACT inhaler, INHALE 2 PUFFS BY MOUTH EVERY 6 HOURS AS NEEDED FOR WHEEZING OR SHORTNESS OF BREATH, Disp: 18 g, Rfl: 1      Objective:  BP 131/74   Pulse 82   Ht 5' 10 (1.778 m) Comment: per pt  Wt 185 lb (83.9 kg)   SpO2 91%   BMI 26.54 kg/m   Wt Readings from Last 3 Encounters:  09/01/24 185 lb (83.9 kg)  04/07/24 198 lb (89.8 kg)  03/30/24 198 lb 6.4 oz (90 kg)     Physical Exam Constitutional:      General: He is not in acute distress.    Appearance: Normal appearance.  Eyes:     General: No scleral icterus.    Conjunctiva/sclera: Conjunctivae normal.  Cardiovascular:     Rate and Rhythm: Normal rate and regular rhythm.  Pulmonary:     Breath sounds: Examination of the right-lower field reveals decreased breath sounds. Decreased breath sounds present. No wheezing, rhonchi or rales.  Musculoskeletal:     Right lower leg: No edema.     Left lower leg: No edema.  Skin:    General: Skin is warm and dry.  Neurological:     General: No focal deficit present.      Diagnostic Review:  Last CBC Lab Results  Component Value Date   WBC 7.7 01/24/2021   HGB 13.7 01/24/2021   HCT 41.9 01/24/2021   MCV 84.9 01/24/2021   MCH 27.2 06/01/2020   RDW 16.0 (H) 01/24/2021   PLT 251.0 01/24/2021   Last metabolic panel Lab Results  Component Value Date   GLUCOSE 157 (H) 04/17/2021   NA 140 04/17/2021   K 4.3 04/17/2021   CL 101 04/17/2021   CO2 23 04/17/2021   BUN 19 04/17/2021   CREATININE 1.03 04/17/2021   EGFR 79 04/17/2021   CALCIUM  9.7 04/17/2021   PROT 6.8 06/01/2020   ALBUMIN 3.6 06/01/2020   LABGLOB 2.8 11/23/2019   AGRATIO 1.4 11/23/2019   BILITOT 0.6 06/01/2020   ALKPHOS 103 06/01/2020   AST 19 06/01/2020   ALT 21 06/01/2020   ANIONGAP 9 06/01/2020       Assessment & Plan:   Assessment & Plan Asthma-COPD overlap syndrome (HCC)     Right lower lobe lung mass  Orders:   NM PET Image Initial (PI) Skull Base To Thigh; Future   Assessment and Plan Assessment & Plan Right lower lung mass/consolidation, possible malignancy or infection CT scan shows progression of right lower lung mass/consolidation since August 2025, raising concern for malignancy or infection. Differential includes cancer or pneumonia. Biopsy and lung washings needed for diagnosis. - Scheduled bronchoscopy with biopsy and lung washings on December  12th, 2025. - Hold Eliquis  two days prior to procedure. - Ordered PET scan to assess for additional areas of activity. - Scheduled follow-up visit on December 18th, 2025, to review results.  Chronic obstructive pulmonary disease (COPD) No acute exacerbations reported. - continue trelegy ellipta  1 puff daily - fasenra  injections - azithromycin  daily - montelukast  - PRN albuterol      Return for f/u visit Dr. Kara.   Dorn KATHEE Kara, MD

## 2024-09-01 NOTE — Progress Notes (Unsigned)
 Established Patient Pulmonology Office Visit   Subjective:  Patient ID: Nicholas Camacho, male    DOB: November 04, 1950  MRN: [id number]  CC:  Chief Complaint  Patient presents with   Medical Management of Chronic Issues    Pt states no new concerns     Discussed the use of AI scribe software for clinical note transcription with the patient, who gave verbal consent to proceed.  History of Present Illness Nicholas Camacho is a 73 year old male, former smoker with asthma-COPD overlap syndrome and nocturnal hypoxemia who returns for follow-up on CT scan results.  A CT scan in August 2025 showed Extensive peribronchovascular and centrilobular nodularity throughout the right lower lobe, substantially worsened from prior chest CT with new dominant 22 mm right lower lobe nodule on image 195. The right lower lobe airways are largely replaced by dense patchy material. On 08/17/24  CT Chest is showing Masslike consolidation in the superior segment right lower lobe measures at least 66.8 mm, increased from 18.2 mm, and appears to cross the right major fissure, into the right middle lobe. Debris is seen in the right lower lobe bronchus with endobronchial debris in the basal segmental bronchi. Extensive centrilobular nodularity in the right lower lobe.  He has had weight loss since July.  He is on azithromycin  250mg  daily, fasenra  injections, montelukast  and trelegy for his COPD-asthma.  His cough has improved and he sleeps through the night without using a machine.  He takes Eliquis  twice a day and had a stroke in 2021 when he missed doses. He was in normal sinus rhythm in June 2025.  He has a smoking history. He denies feeling unwell or having specific new symptoms.        ROS    Current Outpatient Medications:    atorvastatin  (LIPITOR ) 80 MG tablet, Take 1 tablet (80 mg total) by mouth daily., Disp: 90 tablet, Rfl: 3   azithromycin  (ZITHROMAX ) 250 MG tablet, TAKE 1 TABLET BY MOUTH  DAILY, Disp: 30 tablet, Rfl: 5   benralizumab  (FASENRA  PEN) 30 MG/ML prefilled autoinjector, Inject 1 mL (30 mg total) into the skin every 8 (eight) weeks., Disp: 1 mL, Rfl: 2   Cyanocobalamin  1000 MCG CAPS, Take 1,000 mcg by mouth daily., Disp: , Rfl:    diltiazem  (CARDIZEM  CD) 180 MG 24 hr capsule, TAKE 1 CAPSULE(180 MG) BY MOUTH DAILY, Disp: 90 capsule, Rfl: 2   ELIQUIS  5 MG TABS tablet, TAKE 1 TABLET BY MOUTH TWICE DAILY, Disp: 60 tablet, Rfl: 5   fluticasone  (FLONASE ) 50 MCG/ACT nasal spray, SHAKE LIQUID AND USE 2 SPRAYS IN EACH NOSTRIL TWICE DAILY, Disp: 16 g, Rfl: 3   ipratropium (ATROVENT ) 0.03 % nasal spray, Place 2 sprays into both nostrils every 12 (twelve) hours., Disp: 30 mL, Rfl: 12   ipratropium-albuterol  (DUONEB) 0.5-2.5 (3) MG/3ML SOLN, USE 1 VIAL VIA NEBULIZER THREE TIMES DAILY, Disp: 360 mL, Rfl: 6   levothyroxine (SYNTHROID) 50 MCG tablet, Take 50 mcg by mouth every morning., Disp: , Rfl:    losartan  (COZAAR ) 50 MG tablet, TAKE 1 TABLET(50 MG) BY MOUTH DAILY, Disp: 90 tablet, Rfl: 2   montelukast  (SINGULAIR ) 10 MG tablet, Take 1 tablet (10 mg total) by mouth at bedtime., Disp: 30 tablet, Rfl: 11   nitroGLYCERIN  (NITROSTAT ) 0.4 MG SL tablet, Place 1 tablet under the tongue every 5 (five) minutes as needed for chest pain., Disp: , Rfl:    Respiratory Therapy Supplies (FLUTTER) DEVI, 1 Device by Does not apply route  in the morning and at bedtime., Disp: 1 each, Rfl: 0   TRELEGY ELLIPTA  200-62.5-25 MCG/ACT AEPB, INHALE 1 PUFF INTO THE LUNGS DAILY, Disp: 60 each, Rfl: 6   VENTOLIN  HFA 108 (90 Base) MCG/ACT inhaler, INHALE 2 PUFFS BY MOUTH EVERY 6 HOURS AS NEEDED FOR WHEEZING OR SHORTNESS OF BREATH, Disp: 18 g, Rfl: 1      Objective:  BP 131/74   Pulse 82   Ht 5' 10 (1.778 m) Comment: per pt  Wt 185 lb (83.9 kg)   SpO2 91%   BMI 26.54 kg/m   Wt Readings from Last 3 Encounters:  09/01/24 185 lb (83.9 kg)  04/07/24 198 lb (89.8 kg)  03/30/24 198 lb 6.4 oz (90 kg)     Physical Exam Constitutional:      General: He is not in acute distress.    Appearance: Normal appearance.  Eyes:     General: No scleral icterus.    Conjunctiva/sclera: Conjunctivae normal.  Cardiovascular:     Rate and Rhythm: Normal rate and regular rhythm.  Pulmonary:     Breath sounds: Examination of the right-lower field reveals decreased breath sounds. Decreased breath sounds present. No wheezing, rhonchi or rales.  Musculoskeletal:     Right lower leg: No edema.     Left lower leg: No edema.  Skin:    General: Skin is warm and dry.  Neurological:     General: No focal deficit present.      Diagnostic Review:  Last CBC Lab Results  Component Value Date   WBC 7.7 01/24/2021   HGB 13.7 01/24/2021   HCT 41.9 01/24/2021   MCV 84.9 01/24/2021   MCH 27.2 06/01/2020   RDW 16.0 (H) 01/24/2021   PLT 251.0 01/24/2021   Last metabolic panel Lab Results  Component Value Date   GLUCOSE 157 (H) 04/17/2021   NA 140 04/17/2021   K 4.3 04/17/2021   CL 101 04/17/2021   CO2 23 04/17/2021   BUN 19 04/17/2021   CREATININE 1.03 04/17/2021   EGFR 79 04/17/2021   CALCIUM  9.7 04/17/2021   PROT 6.8 06/01/2020   ALBUMIN 3.6 06/01/2020   LABGLOB 2.8 11/23/2019   AGRATIO 1.4 11/23/2019   BILITOT 0.6 06/01/2020   ALKPHOS 103 06/01/2020   AST 19 06/01/2020   ALT 21 06/01/2020   ANIONGAP 9 06/01/2020       Assessment & Plan:   Assessment & Plan Asthma-COPD overlap syndrome (HCC)     Right lower lobe lung mass  Orders:   NM PET Image Initial (PI) Skull Base To Thigh; Future   Assessment and Plan Assessment & Plan Right lower lung mass/consolidation, possible malignancy or infection CT scan shows progression of right lower lung mass/consolidation since August 2025, raising concern for malignancy or infection. Differential includes cancer or pneumonia. Biopsy and lung washings needed for diagnosis. - Scheduled bronchoscopy with biopsy and lung washings on December  12th, 2025. - Hold Eliquis  two days prior to procedure. - Ordered PET scan to assess for additional areas of activity. - Scheduled follow-up visit on December 18th, 2025, to review results.  Chronic obstructive pulmonary disease (COPD) No acute exacerbations reported. - continue trelegy ellipta  1 puff daily - fasenra  injections - azithromycin  daily - montelukast  - PRN albuterol      Return for f/u visit Dr. Kara.   Dorn KATHEE Kara, MD

## 2024-09-02 ENCOUNTER — Other Ambulatory Visit: Payer: Self-pay | Admitting: *Deleted

## 2024-09-02 ENCOUNTER — Encounter: Payer: Self-pay | Admitting: Pulmonary Disease

## 2024-09-02 ENCOUNTER — Telehealth: Payer: Self-pay | Admitting: Cardiology

## 2024-09-02 ENCOUNTER — Encounter: Payer: Self-pay | Admitting: Emergency Medicine

## 2024-09-02 ENCOUNTER — Telehealth: Payer: Self-pay

## 2024-09-02 DIAGNOSIS — I10 Essential (primary) hypertension: Secondary | ICD-10-CM

## 2024-09-02 NOTE — Telephone Encounter (Signed)
 Pt is aware of appt and all details. Case ID # [id number]. Sending to [employer] to [facility]

## 2024-09-02 NOTE — Telephone Encounter (Signed)
Paper work faxed to [employer]

## 2024-09-02 NOTE — Telephone Encounter (Signed)
 More than likely, 2 day hold is adequate. But we do not have any recent labs on him. We need an updated (in last 1 year) CBC and BMET before making recommendations. Please see if PCP has drawn these and request copy. If not, pt will need to come in for BMET, CBC. Glendia Ferrier, PA-C    09/02/2024 2:43 PM

## 2024-09-02 NOTE — Telephone Encounter (Signed)
 Called pt and pt would like to know if it's ok to stop Eliquis  2 days before the bronchoscopy that scheduled for 12/15. Made pt aware that this will be forward to his provider. Understanding verbalized.

## 2024-09-02 NOTE — Assessment & Plan Note (Signed)
  Orders:   NM PET Image Initial (PI) Skull Base To Thigh; Future

## 2024-09-02 NOTE — Telephone Encounter (Signed)
 Pt is having a procedure done 12/15 and they are wanting him to stop his ELIQUIS  5 MG TABS tablet  for a few days prior. Please advise.

## 2024-09-02 NOTE — Telephone Encounter (Signed)
 Called and  made pt aware that  Glendia Ferrier, PA-C verbalized more than likely 2 day hold is adequate but recent labs needed to be obtained. Ordered placed per Glendia Ferrier, PA-C for cbc and bmet. Pt verbalized an understanding

## 2024-09-03 ENCOUNTER — Encounter: Payer: Self-pay | Admitting: Pulmonary Disease

## 2024-09-03 DIAGNOSIS — I10 Essential (primary) hypertension: Secondary | ICD-10-CM | POA: Diagnosis not present

## 2024-09-04 ENCOUNTER — Ambulatory Visit: Payer: Self-pay | Admitting: Physician Assistant

## 2024-09-04 LAB — BASIC METABOLIC PANEL WITH GFR
BUN/Creatinine Ratio: 18 (ref 10–24)
BUN: 16 mg/dL (ref 8–27)
CO2: 22 mmol/L (ref 20–29)
Calcium: 9.1 mg/dL (ref 8.6–10.2)
Chloride: 101 mmol/L (ref 96–106)
Creatinine, Ser: 0.91 mg/dL (ref 0.76–1.27)
Glucose: 105 mg/dL — AB (ref 70–99)
Potassium: 4.4 mmol/L (ref 3.5–5.2)
Sodium: 140 mmol/L (ref 134–144)
eGFR: 90 mL/min/1.73 (ref >59)

## 2024-09-04 LAB — CBC
Hematocrit: 40.8 % (ref 37.5–51.0)
Hemoglobin: 12.7 g/dL — ABNORMAL LOW (ref 13.0–17.7)
MCH: 26.7 pg (ref 26.6–33.0)
MCHC: 31.1 g/dL — ABNORMAL LOW (ref 31.5–35.7)
MCV: 86 fL (ref 79–97)
Platelets: 420 x10E3/uL (ref 150–450)
RBC: 4.75 x10E6/uL (ref 4.14–5.80)
RDW: 13.2 % (ref 11.6–15.4)
WBC: 8.3 x10E3/uL (ref 3.4–10.8)

## 2024-09-04 NOTE — Telephone Encounter (Signed)
 Labs ok. Ok to hold Eliquis  x 2 days and resume Eliquis  ASAP after procedure. Glendia Ferrier, PA-C    09/04/2024 12:58 PM

## 2024-09-10 ENCOUNTER — Encounter (HOSPITAL_COMMUNITY)
Admission: RE | Admit: 2024-09-10 | Discharge: 2024-09-10 | Disposition: A | Discharge status: Home / Self Care | Source: Ambulatory Visit | Attending: Pulmonary Disease | Admitting: Pulmonary Disease

## 2024-09-10 ENCOUNTER — Encounter (HOSPITAL_COMMUNITY): Payer: Self-pay | Admitting: Pulmonary Disease

## 2024-09-10 ENCOUNTER — Other Ambulatory Visit: Payer: Self-pay

## 2024-09-10 DIAGNOSIS — R918 Other nonspecific abnormal finding of lung field: Secondary | ICD-10-CM

## 2024-09-10 MED ORDER — FLUDEOXYGLUCOSE F - 18 (FDG) INJECTION
9.5900 | Freq: Once | INTRAVENOUS | Status: AC | PRN
  Administered 2024-09-10: 9.59 via INTRAVENOUS

## 2024-09-10 NOTE — Anesthesia Preprocedure Evaluation (Signed)
 Anesthesia Evaluation  Patient identified by MRN, date of birth, ID band Patient awake    Reviewed: Allergy & Precautions, NPO status , Patient's Chart, lab work & pertinent test results, reviewed documented beta blocker date and time   History of Anesthesia Complications Negative for: history of anesthetic complications  Airway Mallampati: I  TM Distance: >3 FB     Dental  (+) Edentulous Upper, Edentulous Lower   Pulmonary pneumonia, resolved, COPD,  COPD inhaler, former smoker   breath sounds clear to auscultation + decreased breath sounds      Cardiovascular hypertension, + CAD  (-) Cardiac Stents, (-) CABG and (-) Peripheral Vascular Disease + dysrhythmias Atrial Fibrillation (-) pacemaker(-) Cardiac Defibrillator  Rhythm:Regular Rate:Normal  IMPRESSION: 1. Right lower lobe mass crosses the right major fissure and is markedly enlarged from 05/15/2024. Lung-RADS 4X, highly suspicious. Additional imaging evaluation or consultation with Pulmonology or Thoracic Surgery recommended. These results will be called to the ordering clinician or representative by the Radiologist Assistant, and communication documented in the PACS or [employer]. 2. Obstruction of the superior segmental right lower lobe bronchus with endobronchial debris in the basal segmental bronchi and endobronchial nodularity, findings which may be postobstructive in etiology. Lymphangitic carcinomatosis cannot be definitively excluded. 3. Chronic calcific pancreatitis. 4. Aortic atherosclerosis (ICD10-I70.0). Coronary artery calcification. 5.  Emphysema (ICD10-J43.9).    Neuro/Psych neg Seizures TIACVA, No Residual Symptoms    GI/Hepatic ,neg GERD  ,,(+) neg Cirrhosis        Endo/Other  Hypothyroidism    Renal/GU Renal disease     Musculoskeletal   Abdominal   Peds  Hematology  (+) Blood dyscrasia, anemia   Anesthesia Other Findings    Reproductive/Obstetrics                              Anesthesia Physical Anesthesia Plan  ASA: 3  Anesthesia Plan: General   Post-op Pain Management:    Induction: Intravenous  PONV Risk Score and Plan: 2 and Ondansetron and Dexamethasone  Airway Management Planned: Oral ETT  Additional Equipment:   Intra-op Plan:   Post-operative Plan: Extubation in OR  Informed Consent: I have reviewed the patients History and Physical, chart, labs and discussed the procedure including the risks, benefits and alternatives for the proposed anesthesia with the patient or authorized representative who has indicated his/her understanding and acceptance.     Dental advisory given  Plan Discussed with: CRNA  Anesthesia Plan Comments: (PAT note by Lynwood Hope, PA-C: 73 year old male follows with cardiology for history of atrial fibrillation on Eliquis , TIA, mild aortic regurgitation.  Echo 03/2024 showed LVEF 65 to 70%, grade 1 DD, normal RV, mild aortic regurgitation, mildly dilated aortic root 40 mm, mildly dilated ascending aorta 44 mm.  Cardiac PET perfusion scan 05/2023 was low risk.  Per telephone encounter by Glendia Ferrier, PA-C on 09/04/2024, okay to hold Eliquis  2 days prior to procedure.  Follows with pulmonology for history of former smoker with asthma/COPD overlap syndrome and nocturnal hypoxemia.  He is maintained on Trelegy Ellipta , Fasenra , daily azithromycin , montelukast , as needed albuterol .  He uses supplemental O2 at night and as needed with exertion.  Recent CT imaging showed progression of right lower lung mass/consolidation since August 2025, raising concern for malignancy or infection.  Bronchoscopy with biopsy recommended.  BMP and CBC 09/03/2024 reviewed, mild anemia with hemoglobin 12.7, otherwise unremarkable.  Patient will need day of surgery evaluation.  EKG 03/30/2024: NSR.  Rate 70.  CT chest 08/17/2024: IMPRESSION: 1. Right lower lobe mass  crosses the right major fissure and is markedly enlarged from 05/15/2024. Lung-RADS 4X, highly suspicious. Additional imaging evaluation or consultation with Pulmonology or Thoracic Surgery recommended. These results will be called to the ordering clinician or representative by the Radiologist Assistant, and communication documented in the PACS or [employer]. 2. Obstruction of the superior segmental right lower lobe bronchus with endobronchial debris in the basal segmental bronchi and endobronchial nodularity, findings which may be postobstructive in etiology. Lymphangitic carcinomatosis cannot be definitively excluded. 3. Chronic calcific pancreatitis. 4. Aortic atherosclerosis (ICD10-I70.0). Coronary artery calcification. 5.  Emphysema (ICD10-J43.9).  TTE 03/20/2024: 1. Left ventricular ejection fraction, by estimation, is 65 to 70%. Left  ventricular ejection fraction by 3D volume is 62 %. The left ventricle has  normal function. The left ventricle has no regional wall motion  abnormalities. Left ventricular diastolic  parameters are consistent with Grade I diastolic dysfunction (impaired  relaxation). The average left ventricular global longitudinal strain is  -19.4 %. The global longitudinal strain is normal.  2. Right ventricular systolic function is normal. The right ventricular  size is normal.  3. The mitral valve is normal in structure. Trivial mitral valve  regurgitation. No evidence of mitral stenosis.  4. The aortic valve is tricuspid. There is mild calcification of the  aortic valve. Aortic valve regurgitation is mild. Aortic valve  sclerosis/calcification is present, without any evidence of aortic  stenosis.  5. Aortic dilatation noted. There is mild dilatation of the aortic root,  measuring 40 mm. There is moderate dilatation of the ascending aorta,  measuring 44 mm.  6. The inferior vena cava is normal in size with greater than 50%  respiratory  variability, suggesting right atrial pressure of 3 mmHg.   Cardiac PET CT 05/08/2023:   Fixed apical to mid inferior perfusion defect.  However with normal myocardial blood flow reserve both globally and in the location of the perfusion defect suspect artifact.  Low risk study   LV perfusion is abnormal. There is no evidence of ischemia. Defect 1: There is a small defect with mild reduction in uptake present in the apical to mid inferior location(s) that is fixed. There is normal wall motion in the defect area. Consistent with artifact caused by diaphragmatic attenuation.   Rest left ventricular function is abnormal. Rest EF: 44%. Stress left ventricular function is normal. Stress EF: 52%. End diastolic cavity size is normal. End systolic cavity size is normal.   Coronary calcium  was present on the attenuation correction CT images. Severe coronary calcifications were present. Coronary calcifications were present in the left anterior descending artery, left circumflex artery and right coronary artery distribution(s).   The study is normal. The study is low risk.   Electronically signed by Lonni Nanas, MD    )         Anesthesia Quick Evaluation

## 2024-09-10 NOTE — Progress Notes (Addendum)
 PCP - Kip Righter, MD  Cardiologist - Lelon Glendia DASEN, PA-C  Pulmonology - Kara Dorn NOVAK, MD   PPM/ICD - denies Device Orders - n/a Rep Notified - n/a  Chest x-ray -  Chest CT- 08-17-24 EKG - 03-30-24 Stress Test - 05-08-23 ECHO - 03-20-24 Cardiac Cath - 08-30-17 PFT- 11-18-19 PET Scan 09-10-24  CPAP - denies  DM-denies  Blood Thinner Instructions: ELIQUIS  HOLD x 2 days LAST DOSE 09-07-24 Aspirin  Instructions: denies  ERAS Protcol - NPO  COVID TEST- n/a  Anesthesia review: Yes, Hx cardiac history  Patient verbally denies any shortness of breath, fever, cough and chest pain during phone call   -------------  SDW INSTRUCTIONS given:  Your procedure is scheduled on September 11, 2024.  Report to [facility] Main Entrance A at 7:00 A.M., and check in at the Admitting office.  Call this number if you have problems the morning of surgery:  [phone]   Remember:  Do not eat  or drink after midnight the night before your surgery      Take these medicines the morning of surgery with A SIP OF WATER  atorvastatin  (LIPITOR )  diltiazem  (CARDIZEM  CD)  fluticasone  (FLONASE )  ipratropium (ATROVENT )  nasal spray  ipratropium-albuterol  (DUONEB)  levothyroxine (SYNTHROID)  nitroGLYCERIN  (NITROSTAT ) PLEASE CALL [phone] IF USED PRIOR TO PROCEDURE TRELEGY ELLIPTA   VENTOLIN  HFA inhaler   As of today, STOP taking any Aspirin  (unless otherwise instructed by your surgeon) Aleve, Naproxen, Ibuprofen, Motrin, Advil, Goody's, BC's, all herbal medications, fish oil, and all vitamins.                      Do not wear jewelry, make up, or nail polish            Do not wear lotions, powders, perfumes/colognes, or deodorant.            Do not shave 48 hours prior to surgery.  Men may shave face and neck.            Do not bring valuables to the hospital.            [facility] is not responsible for any belongings or valuables.  Do NOT Smoke (Tobacco/Vaping) 24 hours prior to  your procedure If you use a CPAP at night, you may bring all equipment for your overnight stay.   Contacts, glasses, dentures or bridgework may not be worn into surgery.      For patients admitted to the hospital, discharge time will be determined by your treatment team.   Patients discharged the day of surgery will not be allowed to drive home, and someone needs to stay with them for 24 hours.    Special instructions:   [place]- Preparing For Surgery  Before surgery, you can play an important role. Because skin is not sterile, your skin needs to be as free of germs as possible. You can reduce the number of germs on your skin by washing with CHG (chlorahexidine gluconate) Soap before surgery.  CHG is an antiseptic cleaner which kills germs and bonds with the skin to continue killing germs even after washing.    Oral Hygiene is also important to reduce your risk of infection.  Remember - BRUSH YOUR TEETH THE MORNING OF SURGERY WITH YOUR REGULAR TOOTHPASTE  Please do not use if you have an allergy to CHG or antibacterial soaps. If your skin becomes reddened/irritated stop using the CHG.  Do not shave (including legs and underarms)  for at least 48 hours prior to first CHG shower. It is OK to shave your face.  Please follow these instructions carefully.   Shower the NIGHT BEFORE SURGERY and the MORNING OF SURGERY with DIAL Soap.   Pat yourself dry with a CLEAN TOWEL.  Wear CLEAN PAJAMAS to bed the night before surgery  Place CLEAN SHEETS on your bed the night of your first shower and DO NOT SLEEP WITH PETS.   Day of Surgery: Please shower morning of surgery  Wear Clean/Comfortable clothing the morning of surgery Do not apply any deodorants/lotions.   Remember to brush your teeth WITH YOUR REGULAR TOOTHPASTE.   Questions were answered. Patient verbalized understanding of instructions.

## 2024-09-10 NOTE — Progress Notes (Signed)
 Anesthesia Chart Review: Same day workup  73 year old male follows with cardiology for history of atrial fibrillation on Eliquis , TIA, mild aortic regurgitation.  Echo 03/2024 showed LVEF 65 to 70%, grade 1 DD, normal RV, mild aortic regurgitation, mildly dilated aortic root 40 mm, mildly dilated ascending aorta 44 mm.  Cardiac PET perfusion scan 05/2023 was low risk.  Per telephone encounter by Glendia Ferrier, PA-C on 09/04/2024, okay to hold Eliquis  2 days prior to procedure.  Follows with pulmonology for history of former smoker with asthma/COPD overlap syndrome and nocturnal hypoxemia.  He is maintained on Trelegy Ellipta , Fasenra , daily azithromycin , montelukast , as needed albuterol .  He uses supplemental O2 at night and as needed with exertion.  Recent CT imaging showed progression of right lower lung mass/consolidation since August 2025, raising concern for malignancy or infection.  Bronchoscopy with biopsy recommended.  BMP and CBC 09/03/2024 reviewed, mild anemia with hemoglobin 12.7, otherwise unremarkable.  Patient will need day of surgery evaluation.  EKG 03/30/2024: NSR.  Rate 70.  CT chest 08/17/2024: IMPRESSION: 1. Right lower lobe mass crosses the right major fissure and is markedly enlarged from 05/15/2024. Lung-RADS 4X, highly suspicious. Additional imaging evaluation or consultation with Pulmonology or Thoracic Surgery recommended. These results will be called to the ordering clinician or representative by the Radiologist Assistant, and communication documented in the PACS or [employer]. 2. Obstruction of the superior segmental right lower lobe bronchus with endobronchial debris in the basal segmental bronchi and endobronchial nodularity, findings which may be postobstructive in etiology. Lymphangitic carcinomatosis cannot be definitively excluded. 3. Chronic calcific pancreatitis. 4. Aortic atherosclerosis (ICD10-I70.0). Coronary artery calcification. 5.  Emphysema  (ICD10-J43.9).  TTE 03/20/2024: 1. Left ventricular ejection fraction, by estimation, is 65 to 70%. Left  ventricular ejection fraction by 3D volume is 62 %. The left ventricle has  normal function. The left ventricle has no regional wall motion  abnormalities. Left ventricular diastolic   parameters are consistent with Grade I diastolic dysfunction (impaired  relaxation). The average left ventricular global longitudinal strain is  -19.4 %. The global longitudinal strain is normal.   2. Right ventricular systolic function is normal. The right ventricular  size is normal.   3. The mitral valve is normal in structure. Trivial mitral valve  regurgitation. No evidence of mitral stenosis.   4. The aortic valve is tricuspid. There is mild calcification of the  aortic valve. Aortic valve regurgitation is mild. Aortic valve  sclerosis/calcification is present, without any evidence of aortic  stenosis.   5. Aortic dilatation noted. There is mild dilatation of the aortic root,  measuring 40 mm. There is moderate dilatation of the ascending aorta,  measuring 44 mm.   6. The inferior vena cava is normal in size with greater than 50%  respiratory variability, suggesting right atrial pressure of 3 mmHg.   Cardiac PET CT 05/08/2023:   Fixed apical to mid inferior perfusion defect.  However with normal myocardial blood flow reserve both globally and in the location of the perfusion defect suspect artifact.  Low risk study   LV perfusion is abnormal. There is no evidence of ischemia. Defect 1: There is a small defect with mild reduction in uptake present in the apical to mid inferior location(s) that is fixed. There is normal wall motion in the defect area. Consistent with artifact caused by diaphragmatic attenuation.   Rest left ventricular function is abnormal. Rest EF: 44%. Stress left ventricular function is normal. Stress EF: 52%. End diastolic cavity size is  normal. End systolic cavity size is normal.    Coronary calcium  was present on the attenuation correction CT images. Severe coronary calcifications were present. Coronary calcifications were present in the left anterior descending artery, left circumflex artery and right coronary artery distribution(s).   The study is normal. The study is low risk.   Electronically signed by Lonni Nanas, MD    Lynwood Hope, PA-C [facility] Short Stay Center/Anesthesiology Phone [phone] 09/10/2024 3:45 PM

## 2024-09-11 ENCOUNTER — Ambulatory Visit (HOSPITAL_COMMUNITY)

## 2024-09-11 ENCOUNTER — Ambulatory Visit (HOSPITAL_COMMUNITY): Admitting: Physician Assistant

## 2024-09-11 ENCOUNTER — Encounter (HOSPITAL_COMMUNITY): Payer: Self-pay | Admitting: Pulmonary Disease

## 2024-09-11 ENCOUNTER — Ambulatory Visit (HOSPITAL_COMMUNITY)
Admission: RE | Admit: 2024-09-11 | Discharge: 2024-09-11 | Disposition: A | Discharge status: Home / Self Care | Attending: Pulmonary Disease | Admitting: Pulmonary Disease

## 2024-09-11 DIAGNOSIS — I4891 Unspecified atrial fibrillation: Secondary | ICD-10-CM | POA: Diagnosis not present

## 2024-09-11 DIAGNOSIS — R918 Other nonspecific abnormal finding of lung field: Secondary | ICD-10-CM

## 2024-09-11 DIAGNOSIS — Z48813 Encounter for surgical aftercare following surgery on the respiratory system: Secondary | ICD-10-CM | POA: Diagnosis not present

## 2024-09-11 DIAGNOSIS — I2510 Atherosclerotic heart disease of native coronary artery without angina pectoris: Secondary | ICD-10-CM | POA: Diagnosis not present

## 2024-09-11 DIAGNOSIS — I700 Atherosclerosis of aorta: Secondary | ICD-10-CM | POA: Diagnosis not present

## 2024-09-11 DIAGNOSIS — Z87891 Personal history of nicotine dependence: Secondary | ICD-10-CM | POA: Diagnosis not present

## 2024-09-11 DIAGNOSIS — J449 Chronic obstructive pulmonary disease, unspecified: Secondary | ICD-10-CM | POA: Diagnosis not present

## 2024-09-11 DIAGNOSIS — C3431 Malignant neoplasm of lower lobe, right bronchus or lung: Secondary | ICD-10-CM | POA: Diagnosis not present

## 2024-09-11 DIAGNOSIS — I10 Essential (primary) hypertension: Secondary | ICD-10-CM | POA: Diagnosis not present

## 2024-09-11 DIAGNOSIS — E039 Hypothyroidism, unspecified: Secondary | ICD-10-CM | POA: Diagnosis not present

## 2024-09-11 HISTORY — PX: VIDEO BRONCHOSCOPY WITH ENDOBRONCHIAL NAVIGATION: SHX6175

## 2024-09-11 HISTORY — PX: BRONCHIAL BRUSHINGS: SHX5108

## 2024-09-11 HISTORY — DX: Anemia, unspecified: D64.9

## 2024-09-11 HISTORY — DX: Hypothyroidism, unspecified: E03.9

## 2024-09-11 HISTORY — PX: BRONCHIAL BIOPSY: SHX5109

## 2024-09-11 HISTORY — DX: Cerebral infarction, unspecified: I63.9

## 2024-09-11 HISTORY — PX: BRONCHIAL NEEDLE ASPIRATION BIOPSY: SHX5106

## 2024-09-11 SURGERY — VIDEO BRONCHOSCOPY WITH ENDOBRONCHIAL NAVIGATION
Anesthesia: General | Laterality: Right

## 2024-09-11 MED ORDER — CHLORHEXIDINE GLUCONATE 0.12 % MT SOLN
15.0000 mL | Freq: Once | OROMUCOSAL | Status: AC
  Administered 2024-09-11: 15 mL via OROMUCOSAL

## 2024-09-11 MED ORDER — FENTANYL CITRATE (PF) 100 MCG/2ML IJ SOLN: 25.0000 ug | INTRAMUSCULAR | Status: DC | PRN

## 2024-09-11 MED ORDER — ACETAMINOPHEN 10 MG/ML IV SOLN
1000.0000 mg | Freq: Once | INTRAVENOUS | Status: DC | PRN
  Filled 2024-09-11: qty 100

## 2024-09-11 MED ORDER — ONDANSETRON HCL 4 MG/2ML IJ SOLN: 4.0000 mg | Freq: Once | INTRAMUSCULAR | Status: DC | PRN

## 2024-09-11 MED ORDER — FENTANYL CITRATE (PF) 250 MCG/5ML IJ SOLN
INTRAMUSCULAR | Status: DC | PRN
  Administered 2024-09-11: 50 ug via INTRAVENOUS

## 2024-09-11 MED ORDER — LACTATED RINGERS IV SOLN: INTRAVENOUS | Status: DC

## 2024-09-11 MED ORDER — OXYCODONE HCL 5 MG PO TABS
5.0000 mg | ORAL_TABLET | Freq: Once | ORAL | Status: DC | PRN
  Filled 2024-09-11: qty 1

## 2024-09-11 MED ORDER — PROPOFOL 500 MG/50ML IV EMUL
INTRAVENOUS | Status: DC | PRN
  Administered 2024-09-11: 125 ug/kg/min via INTRAVENOUS

## 2024-09-11 MED ORDER — ONDANSETRON HCL 4 MG/2ML IJ SOLN
INTRAMUSCULAR | Status: DC | PRN
  Administered 2024-09-11: 4 mg via INTRAVENOUS

## 2024-09-11 MED ORDER — FENTANYL CITRATE (PF) 100 MCG/2ML IJ SOLN
INTRAMUSCULAR | Status: AC
  Filled 2024-09-11: qty 2

## 2024-09-11 MED ORDER — PHENYLEPHRINE 80 MCG/ML (10ML) SYRINGE FOR IV PUSH (FOR BLOOD PRESSURE SUPPORT)
PREFILLED_SYRINGE | INTRAVENOUS | Status: DC | PRN
  Administered 2024-09-11: 160 ug via INTRAVENOUS

## 2024-09-11 MED ORDER — PROPOFOL 10 MG/ML IV BOLUS
INTRAVENOUS | Status: DC | PRN
  Administered 2024-09-11: 120 mg via INTRAVENOUS

## 2024-09-11 MED ORDER — ROCURONIUM BROMIDE 10 MG/ML (PF) SYRINGE
PREFILLED_SYRINGE | INTRAVENOUS | Status: DC | PRN
  Administered 2024-09-11 (×2): 20 mg via INTRAVENOUS
  Administered 2024-09-11: 50 mg via INTRAVENOUS

## 2024-09-11 MED ORDER — OXYCODONE HCL 5 MG/5ML PO SOLN
5.0000 mg | Freq: Once | ORAL | Status: DC | PRN
  Filled 2024-09-11: qty 5

## 2024-09-11 MED ORDER — LIDOCAINE 2% (20 MG/ML) 5 ML SYRINGE
INTRAMUSCULAR | Status: DC | PRN
  Administered 2024-09-11: 60 mg via INTRAVENOUS

## 2024-09-11 MED ORDER — SUGAMMADEX SODIUM 200 MG/2ML IV SOLN
INTRAVENOUS | Status: DC | PRN
  Administered 2024-09-11: 200 mg via INTRAVENOUS

## 2024-09-11 MED ADMIN — EPINEPHRINE 1:10,000/NS (ENDO) SCLEROTHERAPY INJ: 2 mL | NDC 76329331801

## 2024-09-11 MED FILL — Epinephrine IV Soln Prefilled Syringe 1 MG/10ML (0.1 MG/ML): INTRAVENOUS | Qty: 10 | Status: AC

## 2024-09-11 SURGICAL SUPPLY — 36 items
ADAPTER BRONCHOSCOPE OLYMPUS (ADAPTER) ×2 IMPLANT
ADAPTER VALVE BIOPSY EBUS (MISCELLANEOUS) IMPLANT
BAG COUNTER SPONGE SURGICOUNT (BAG) ×2 IMPLANT
BRUSH CYTOL CELLEBRITY 1.5X140 (MISCELLANEOUS) ×2 IMPLANT
BRUSH SUPERTRAX BIOPSY (INSTRUMENTS) IMPLANT
BRUSH SUPERTRAX NDL-TIP CYTO (INSTRUMENTS) ×2 IMPLANT
CANISTER SUCTION 3000ML PPV (SUCTIONS) ×2 IMPLANT
CNTNR URN SCR LID CUP LEK RST (MISCELLANEOUS) ×2 IMPLANT
COVER BACK TABLE 60X90IN (DRAPES) ×2 IMPLANT
FILTER STRAW FLUID ASPIR (MISCELLANEOUS) IMPLANT
FORCEPS BIOP 1.5 SINGLE USE (MISCELLANEOUS) ×2 IMPLANT
FORCEPS BIOP SUPERTRX PREMAR (INSTRUMENTS) ×2 IMPLANT
GAUZE SPONGE 4X4 12PLY STRL (GAUZE/BANDAGES/DRESSINGS) ×2 IMPLANT
GLOVE BIO SURGEON STRL SZ7.5 (GLOVE) ×4 IMPLANT
GOWN STRL REUS W/ TWL LRG LVL3 (GOWN DISPOSABLE) ×4 IMPLANT
KIT CLEAN ENDO COMPLIANCE (KITS) ×2 IMPLANT
KIT LOCATABLE GUIDE (CANNULA) IMPLANT
KIT MARKER FIDUCIAL DELIVERY (KITS) IMPLANT
KIT TURNOVER KIT B (KITS) ×2 IMPLANT
MARKER SKIN DUAL TIP RULER LAB (MISCELLANEOUS) ×2 IMPLANT
NDL SUPERTRX PREMARK BIOPSY (NEEDLE) ×2 IMPLANT
NEEDLE SUPERTRX PREMARK BIOPSY (NEEDLE) ×2 IMPLANT
OIL SILICONE PENTAX (PARTS (SERVICE/REPAIRS)) ×2 IMPLANT
PAD ARMBOARD POSITIONER FOAM (MISCELLANEOUS) ×4 IMPLANT
PATCHES PATIENT (LABEL) ×6 IMPLANT
SOLN 0.9% NACL POUR BTL 1000ML (IV SOLUTION) ×2 IMPLANT
SOLN STERILE WATER BTL 1000 ML (IV SOLUTION) ×2 IMPLANT
SYR 20ML ECCENTRIC (SYRINGE) ×2 IMPLANT
SYR 20ML LL LF (SYRINGE) ×2 IMPLANT
SYR 50ML SLIP (SYRINGE) ×2 IMPLANT
TOWEL GREEN STERILE FF (TOWEL DISPOSABLE) ×2 IMPLANT
TRAP SPECIMEN MUCUS 40CC (MISCELLANEOUS) IMPLANT
TUBE CONNECTING 20X1/4 (TUBING) ×2 IMPLANT
UNDERPAD 30X36 HEAVY ABSORB (UNDERPADS AND DIAPERS) ×2 IMPLANT
VALVE BIOPSY SINGLE USE (MISCELLANEOUS) ×2 IMPLANT
VALVE SUCTION BRONCHIO DISP (MISCELLANEOUS) ×2 IMPLANT

## 2024-09-11 NOTE — Op Note (Signed)
 Video Bronchoscopy with Robotic Assisted Bronchoscopic Navigation   Date of Operation: 09/11/2024   Pre-op Diagnosis: Lung Mass  Post-op Diagnosis: Lung Mass  Surgeon: Dorn Chill, MD  Assistants: n/a  Anesthesia: General endotracheal anesthesia  Operation: Flexible video fiberoptic bronchoscopy with robotic assistance and biopsies.  Estimated Blood Loss: Minimal  Complications: None  Indications and History: STEPHAN NELIS is a 73 y.o. male with history of right lower lobe mass.  Recommendation made to achieve a tissue diagnosis via robotic assisted navigational bronchoscopy.  The risks, benefits, complications, treatment options and expected outcomes were discussed with the patient.  The possibilities of pneumothorax, pneumonia, reaction to medication, pulmonary aspiration, perforation of a viscus, bleeding, failure to diagnose a condition and creating a complication requiring transfusion or operation were discussed with the patient who freely signed the consent.    Description of Procedure: The patient was seen in the Preoperative Area, was examined and was deemed appropriate to proceed.  The patient was taken to [facility] Endoscopy room 3, identified as Carlin CHRISTELLA Ranger and the procedure verified as Flexible Video Fiberoptic Bronchoscopy.  A Time Out was held and the above information confirmed.   Prior to the date of the procedure a high-resolution CT scan of the chest was performed. Utilizing ION software program a virtual tracheobronchial tree was generated to allow the creation of distinct navigation pathways to the patient's parenchymal abnormalities. After being taken to the operating room general anesthesia was initiated and the patient  was orally intubated. The video fiberoptic bronchoscope was introduced via the endotracheal tube and a general inspection was performed which showed normal right and left lung anatomy. Aspiration of the bilateral mainstems was completed to  remove any remaining secretions. A right lower lobe endobronchial lesion was removed via forcep biopsies. Robotic catheter inserted into patient's endotracheal tube.   Target #1 Right Lower Lobe Mass and Endobronchial Lesion: The distinct navigation pathways prepared prior to this procedure were then utilized to navigate to patient's lesion identified on CT scan. The robotic catheter was secured into place and the vision probe was withdrawn.  Lesion location was approximated using fluoroscopy.  Local registration and targeting was performed using [employer] Cios mobile C-arm three-dimensional imaging. Under fluoroscopic guidance transbronchial needle brushings, transbronchial needle biopsies, and transbronchial forceps biopsies were performed to be sent for cytology and pathology.  Needle-in-lesion was confirmed using Cios mobile C-arm.  A bronchioalveolar lavage was performed in the RLL and sent for cytology and microbiology.  Under fluoroscopic guidance a single fiducial marker was placed adjacent to the nodule.  At the end of the procedure a general airway inspection was performed and there was no evidence of active bleeding. The bronchoscope was removed.  The patient tolerated the procedure well. There was no significant blood loss and there were no obvious complications. A post-procedural chest x-ray is pending.  Samples Target #1: 1. Transbronchial needle brushings from RLL Mass 2. Transbronchial needle biopsies from RLL Mass 3. Transbronchial forceps biopsies from RLL Mass 4. Bronchoalveolar lavage from RLL 5. Endobronchial biopsies from RLL    Plans:  The patient will be discharged from the PACU to home when recovered from anesthesia and after chest x-ray is reviewed. We will review the cytology, pathology and microbiology results with the patient when they become available. Outpatient followup will be with Lauraine Lites, NP.

## 2024-09-11 NOTE — Interval H&P Note (Signed)
 History and Physical Interval Note:  09/11/2024 8:31 AM  Nicholas Camacho  has presented today for surgery, with the diagnosis of Right Lower Lobe Lung Mass.  The various methods of treatment have been discussed with the patient and family. After consideration of risks, benefits and other options for treatment, the patient has consented to  Procedures: VIDEO BRONCHOSCOPY WITH ENDOBRONCHIAL NAVIGATION (Right) as a surgical intervention.  The patient's history has been reviewed, patient examined, no change in status, stable for surgery.  I have reviewed the patient's chart and labs.  Questions were answered to the patient's satisfaction.     Dorn KATHEE Chill

## 2024-09-11 NOTE — Anesthesia Postprocedure Evaluation (Signed)
 Anesthesia Post Note  Patient: Nicholas Camacho  Procedure(s) Performed: VIDEO BRONCHOSCOPY WITH ENDOBRONCHIAL NAVIGATION (Right) BRONCHOSCOPY, WITH BIOPSY BRONCHOSCOPY, WITH BRUSH BIOPSY BRONCHOSCOPY, WITH NEEDLE ASPIRATION BIOPSY     Patient location during evaluation: PACU Anesthesia Type: General Level of consciousness: awake and alert Pain management: pain level controlled Vital Signs Assessment: post-procedure vital signs reviewed and stable Respiratory status: spontaneous breathing, nonlabored ventilation, respiratory function stable and patient connected to nasal cannula oxygen  Cardiovascular status: blood pressure returned to baseline and stable Postop Assessment: no apparent nausea or vomiting Anesthetic complications: no   No notable events documented.  Last Vitals:  Vitals:   09/11/24 1145 09/11/24 1200  BP: (!) 95/52 101/60  Pulse: 66 64  Resp: 19 20  Temp:  36.9 C  SpO2: 94% 93%    Last Pain:  Vitals:   09/11/24 1145  TempSrc:   PainSc: 0-No pain                 Lynwood MARLA Cornea

## 2024-09-11 NOTE — Transfer of Care (Signed)
 Immediate Anesthesia Transfer of Care Note  Patient: Nicholas Camacho  Procedure(s) Performed: VIDEO BRONCHOSCOPY WITH ENDOBRONCHIAL NAVIGATION (Right) BRONCHOSCOPY, WITH BIOPSY BRONCHOSCOPY, WITH BRUSH BIOPSY BRONCHOSCOPY, WITH NEEDLE ASPIRATION BIOPSY  Patient Location: PACU  Anesthesia Type:General  Level of Consciousness: awake and alert   Airway & Oxygen  Therapy: Patient Spontanous Breathing and Patient connected to nasal cannula oxygen   Post-op Assessment: Report given to RN and Post -op Vital signs reviewed and stable  Post vital signs: Reviewed and stable  Last Vitals:  Vitals Value Taken Time  BP 97/74 09/11/24 11:30  Temp 37.2 C 09/11/24 11:16  Pulse 68 09/11/24 11:35  Resp 20 09/11/24 11:35  SpO2 91 % 09/11/24 11:35  Vitals shown include unfiled device data.  Last Pain:  Vitals:   09/11/24 1116  TempSrc:   PainSc: 0-No pain         Complications: No notable events documented.

## 2024-09-11 NOTE — Procedures (Signed)
 Procedure Note  Patient: Nicholas Camacho  [employer] Cios mobile C-arm was utilized to identify and biopsy of RLL mass.   Needle-in-lesion was confirmed using real-time Cios imaging, and images were uploaded to PACS.         Dorn Chill, MD [facility] Office: [phone]   See Amion for personal pager PCCM on call pager [phone] until 7pm. Please call Elink 7p-7a. [phone]

## 2024-09-13 ENCOUNTER — Encounter (HOSPITAL_COMMUNITY): Payer: Self-pay | Admitting: Pulmonary Disease

## 2024-09-13 LAB — ACID FAST SMEAR (AFB, MYCOBACTERIA): Acid Fast Smear: NEGATIVE

## 2024-09-14 ENCOUNTER — Other Ambulatory Visit: Payer: Self-pay

## 2024-09-14 ENCOUNTER — Encounter: Admission: RE | Payer: Self-pay | Attending: Pulmonary Disease

## 2024-09-14 DIAGNOSIS — J4489 Other specified chronic obstructive pulmonary disease: Secondary | ICD-10-CM

## 2024-09-14 DIAGNOSIS — R918 Other nonspecific abnormal finding of lung field: Secondary | ICD-10-CM | POA: Insufficient documentation

## 2024-09-14 LAB — CYTOLOGY - NON PAP

## 2024-09-14 MED ORDER — FASENRA PEN 30 MG/ML ~~LOC~~ SOAJ: 30.0000 mg | SUBCUTANEOUS | 2 refills | Status: AC

## 2024-09-14 NOTE — Progress Notes (Signed)
 Refill sent for FASENRA  to [facility] ([employer]): [phone]  Dose: 30mg  [place] every 8 weeks  Last OV: 09/01/24 Provider: Dr. Kara  Next OV: 09/18/24, 10/22/24  Nicholas Camacho, PharmD, BCPS Clinical Pharmacist   Pulmonary Clinic

## 2024-09-15 LAB — CYTOLOGY - NON PAP

## 2024-09-15 LAB — SURGICAL PATHOLOGY

## 2024-09-16 LAB — AEROBIC/ANAEROBIC CULTURE W GRAM STAIN (SURGICAL/DEEP WOUND)

## 2024-09-18 ENCOUNTER — Ambulatory Visit: Admitting: Acute Care

## 2024-09-18 ENCOUNTER — Encounter: Payer: Self-pay | Admitting: Acute Care

## 2024-09-18 VITALS — BP 107/67 | HR 76 | Temp 98.2°F | Ht 69.0 in | Wt 184.4 lb

## 2024-09-18 DIAGNOSIS — R911 Solitary pulmonary nodule: Secondary | ICD-10-CM | POA: Diagnosis not present

## 2024-09-18 DIAGNOSIS — R942 Abnormal results of pulmonary function studies: Secondary | ICD-10-CM | POA: Diagnosis not present

## 2024-09-18 DIAGNOSIS — C3431 Malignant neoplasm of lower lobe, right bronchus or lung: Secondary | ICD-10-CM

## 2024-09-18 DIAGNOSIS — R9389 Abnormal findings on diagnostic imaging of other specified body structures: Secondary | ICD-10-CM

## 2024-09-18 DIAGNOSIS — Z87891 Personal history of nicotine dependence: Secondary | ICD-10-CM | POA: Diagnosis not present

## 2024-09-18 DIAGNOSIS — Z9889 Other specified postprocedural states: Secondary | ICD-10-CM

## 2024-09-18 DIAGNOSIS — C3490 Malignant neoplasm of unspecified part of unspecified bronchus or lung: Secondary | ICD-10-CM

## 2024-09-18 DIAGNOSIS — A492 Hemophilus influenzae infection, unspecified site: Secondary | ICD-10-CM

## 2024-09-18 MED ORDER — AMOXICILLIN-POT CLAVULANATE 875-125 MG PO TABS: 1.0000 | ORAL_TABLET | Freq: Two times a day (BID) | ORAL | 0 refills | Status: DC

## 2024-09-18 NOTE — Progress Notes (Signed)
 "  History of Present Illness Nicholas Camacho is a 73 y.o. male followed through the lung cancer screening referred for consult after an abnormal screening CT Chest 05/2024.   Synopsis Patient followed through the lung cancer screening program, initially had an abnormal screening scan 05/2024. It was read as a 4 B, however radiology felt it was related to infection or inflammation. Patient had recently been sick, so plan was for a 3 month follow up scan. This was done [id number] and was again read as a 4 x, however, the mass like consolidation in the superior segment right lower lobe measures at least 66.8 mm, increased from 18.2 mm, and appears to cross the right major fissure, into the right middle lobe.This short term growth and morphology was very concerning for malignancy. PET scan was ordered which showed the right infrahilar/right lower lobe lung mass was  hypermetabolic with SUV max of 13.5 and consistent with neoplasm. Pt. Was scheduled for navigational bronchoscopy with biopsies on 09/11/2024. He is here for follow up after the procedure, and to review biopsy results.   09/18/2024 Discussed the use of AI scribe software for clinical note transcription with the patient, who gave verbal consent to proceed.  History of Present Illness Nicholas Camacho is a 72 year old male with a history of  lung nodules who presents for follow-up after a navigational bronchoscopy on 09/11/2024.  He underwent a navigational bronchoscopy on September 11, 2024, and experienced minor bleeding for one to two days post-procedure. He resumed taking Eliquis  two days after the procedure. No fever, discolored secretions, or problems with anesthesia were noted post-procedure.  A nodule in the right lower lobe was initially identified in August 2025 as a 4B on a CT scan. The nodule has shown periods of waxing and waning. 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 Dr. Kara did debulk the area.  He has experienced significant weight loss, approximately 15-20 pounds over the past year, despite maintaining his usual diet.  He has a history of recurrent respiratory infections and has been on azithromycin  for a year. Recent cultures showed rare Haemophilus influenzae, and he is currently prescribed Augmentin , one tablet in the morning and one in the evening for seven days. He is also taking a probiotic with the antibiotic.  He has a past medical history of a stroke four years ago, which occurred after he unintentionally stopped taking his blood thinner. He has a history of COPD and has experienced oxygen  desaturation during sleep, with levels dropping to the 80s. He does not use supplemental oxygen  regularly but monitors his oxygen  levels at home.I suspect he may need an ambulatory saturation walk to ensure he does not need oxygen .   Pt. Has had weight loss, about 20 pounds in 6 months. He is eating normally. No change in diet or exercise. This concerns me . I have asked him to add Fair Life Protein milk  as a supplement. I have asked him to make sure he does not use any protein supplements that had Vitamin K as an additive as   he is on Eliquis . He verbalized understanding.  Pt. Will be referred to radiation oncology and thoracic surgery.I have ordered PFT's and MR Brain to complete staging and to determine eligibility for surgery as an option for treatment.     Test Results: Surgical Pathology 09/11/2024 A. LUNG, RIGHT LOWER LOBE, ENDOBRONCHIAL, BIOPSY:  Squamous cell carcinoma.       See comment.   Cytology 09/11/2024 A. LUNG, RLL ENDOBRONCHIAL LESION , BIOPSY:  - Atypical cells present   B. LUNG, RLL ENDOBRONCHIAL LESION, BRUSHING:  - Atypical cells present   C. LUNG, RLL, FINE NEEDLE ASPIRATION:  - Squamous cell carcinoma.   D. LUNG, RLL, LAVAGE:    FINAL MICROSCOPIC DIAGNOSIS:  - Atypical cells present  Micro  09/11/2024 AFB>>   Negative Fungal >> Negative Aerobic Anaerobic>> Rare H Flu, Beta Lactamase Negative No Anaerobes  Imaging PET scan 09/10/2024 Right infrahilar/right lower lobe lung mass is hypermetabolic with SUV max of 13.5 and consistent with neoplasm. There is significant surrounding airspace process which is likely postobstructive pneumonia. Interstitial spread of tumor is also possible. Obstructed bronchi extending posteriorly could be filled with fluid or tumor. This area has an SUV max of 6.4 and appears to extend right to the pleural surface. 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 There is significant surrounding airspace process which is likely postobstructive pneumonia. Interstitial spread of tumor is also possible. No enlarged or hypermetabolic mediastinal or hilar lymph nodes. No findings for pulmonary metastatic disease or abdominal/pelvic metastatic disease.  08/17/2024 LDCT Follow up Right lower lobe mass crosses the right major fissure and is markedly enlarged from 05/15/2024. Lung-RADS 4X, highly suspicious. Additional imaging evaluation or consultation with Pulmonology or Thoracic Surgery recommended.  2. Obstruction of the superior segmental right lower lobe bronchus with endobronchial debris in the basal segmental bronchi and endobronchial nodularity, findings which may be postobstructive in etiology. Lymphangitic carcinomatosis cannot be definitively excluded. 3. Chronic calcific pancreatitis. 4. Aortic atherosclerosis (ICD10-I70.0). Coronary artery calcification. 5.  Emphysema (ICD10-J43.9).   05/15/2024 LDCT Lung-RADS 4B, suspicious. Additional imaging evaluation or consultation with Pulmonology or Thoracic Surgery recommended. Extensive peribronchovascular and centrilobular nodularity throughout the right lower lobe, substantially worsened from prior chest CT with new dominant 22 mm right lower lobe  nodule. The right lower lobe airways are largely replaced by dense patchy material. Similar chronic mild tree-in-bud opacities in the left upper and left lower lobes. Findings may represent acute worsening of chronic/recurrent infectious bronchiolitis such as due to atypical mycobacterial infection or recurrent aspiration, however primary bronchogenic malignancy cannot be excluded in the right lower lobe. Pulmonology consultation advised. Suggest short-term follow-up noncontrast chest CT in 3 months at a minimum.    Latest Ref Rng & Units 09/03/2024   12:20 PM 01/24/2021   12:43 PM 06/01/2020    6:07 PM  CBC  WBC 3.4 - 10.8 x10E3/uL 8.3  7.7  8.6   Hemoglobin 13.0 - 17.7 g/dL 87.2  86.2  85.7   Hematocrit 37.5 - 51.0 % 40.8  41.9  46.3   Platelets 150 - 450 x10E3/uL 420  251.0  265        Latest Ref Rng & Units 09/03/2024   12:20 PM 04/17/2021   12:01 PM 06/01/2020    6:07 PM  BMP  Glucose 70 - 99 mg/dL 894  842  863   BUN 8 - 27 mg/dL 16  19  12    Creatinine 0.76 - 1.27 mg/dL 9.08  8.96  8.97   BUN/Creat Ratio 10 - 24 18  18     Sodium 134 - 144 mmol/L 140  140  139   Potassium 3.5 - 5.2 mmol/L 4.4  4.3  4.2   Chloride 96 - 106 mmol/L 101  101  104   CO2 20 - 29 mmol/L  22  23  26    Calcium  8.6 - 10.2 mg/dL 9.1  9.7  9.5     BNP    Component Value Date/Time   BNP 54.0 12/22/2015 1042    ProBNP    Component Value Date/Time   PROBNP 70 11/23/2019 1156   PROBNP 55.0 01/04/2016 1204    PFT    Component Value Date/Time   FEV1PRE 1.58 05/04/2022 1030   FEV1POST 1.54 11/18/2019 1452   FVCPRE 3.09 05/04/2022 1030   FVCPOST 2.85 11/18/2019 1452   TLC 7.38 11/18/2019 1452   DLCOUNC 19.63 11/18/2019 1452   PREFEV1FVCRT 51 05/04/2022 1030   PSTFEV1FVCRT 54 11/18/2019 1452    DG Chest Port 1 View Result Date: 09/11/2024 CLINICAL DATA:  Right lung mass.  Status post bronchoscopy. EXAM: PORTABLE CHEST 1 VIEW COMPARISON:  06/08/2020 FINDINGS: Heart size is normal. Right  infrahilar mass noted. Left lung is clear. No evidence of pneumothorax or pleural effusion. IMPRESSION: Right infrahilar mass. No evidence of pneumothorax. Electronically Signed   By: Norleen DELENA Kil M.D.   On: 09/11/2024 12:47   DG C-ARM BRONCHOSCOPY Result Date: 09/11/2024 C-ARM BRONCHOSCOPY: Fluoroscopy was utilized by the requesting physician.  No radiographic interpretation.   DG C-Arm 1-60 Min-No Report Result Date: 09/11/2024 Fluoroscopy was utilized by the requesting physician.  No radiographic interpretation.   DG C-Arm 1-60 Min-No Report Result Date: 09/11/2024 Fluoroscopy was utilized by the requesting physician.  No radiographic interpretation.   NM PET Image Initial (PI) Skull Base To Thigh Result Date: 09/10/2024 CLINICAL DATA:  Initial treatment strategy for right lower lobe lung mass. EXAM: NUCLEAR MEDICINE PET SKULL BASE TO THIGH TECHNIQUE: 9.59 mCi F-18 FDG was injected intravenously. Full-ring PET imaging was performed from the skull base to thigh after the radiotracer. CT data was obtained and used for attenuation correction and anatomic localization. Fasting blood glucose: 108 mg/dl COMPARISON:  Multiple prior chest CTs.  Prior PET-CT 11/12/2022 FINDINGS: Mediastinal blood pool activity: SUV max 2.5 Liver activity: SUV max NA NECK: No hypermetabolic lymph nodes in the neck. Incidental CT findings: Bilateral carotid artery calcifications. 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 There is significant surrounding airspace process which is likely postobstructive pneumonia. Interstitial spread of tumor is also possible. Obstructed bronchi extending posteriorly could be filled with fluid or tumor. This area has an SUV max of 6.4 and appears to extend right to the pleural surface. No enlarged or hypermetabolic mediastinal nodes. No supraclavicular or axillary adenopathy. No pulmonary nodules to suggest pulmonary  metastatic disease. Incidental CT findings: Stable aortic and three-vessel coronary artery calcifications. ABDOMEN/PELVIS: No abnormal hypermetabolic activity within the liver, pancreas, adrenal glands, or spleen. No hypermetabolic lymph nodes in the abdomen or pelvis. Incidental CT findings: Stable cholelithiasis. Extensive calcifications in the pancreatic body and tail regions likely related to prior pancreatitis. Stable advanced atherosclerotic calcifications involving the aorta and iliac arteries. Slight interval enlargement of the abdominal aortic aneurysm. This measures 4.1 x 4.7 cm and previously measured 3.9 x 4.5 cm. Stable severe sigmoid colon diverticulosis. SKELETON: No findings suspicious for osseous metastatic disease. Incidental CT findings: Stable surgical changes at the pubic symphysis with metallic artifact. Evidence of prior healed left sacral fracture. IMPRESSION: 1. 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 There is significant surrounding airspace process which is likely postobstructive pneumonia. Interstitial spread of tumor is also possible. 2. No enlarged or hypermetabolic mediastinal or hilar lymph  nodes. 3. No findings for pulmonary metastatic disease or abdominal/pelvic metastatic disease. 4. Slight interval enlargement of the abdominal aortic aneurysm. Recommend follow-up CT/MR every 6 months and vascular consultation. 5. Stable cholelithiasis. 6. Stable advanced atherosclerotic calcifications involving the aorta and iliac arteries. 7. Aortic atherosclerosis. Aortic Atherosclerosis (ICD10-I70.0). Electronically Signed   By: MYRTIS Stammer M.D.   On: 09/10/2024 19:17     Past medical hx Past Medical History:  Diagnosis Date   Anemia    Hx of anemia x 4 years ago   COPD (chronic obstructive pulmonary disease) (HCC)    Disturbance of skin sensation 06/24/2013   Dysrhythmia    Elevated cholesterol    History of echocardiogram     Echo 4/17: EF 60-65%, no RWMA, Gr 2 DD   Hypertension    Hypothyroidism    Sleep disturbance, unspecified 06/24/2013   Stroke [facility])      Social History[1]  Mr.Fuhrer reports that he quit smoking about 8 years ago. His smoking use included cigarettes. He started smoking about 53 years ago. He has a 67.5 pack-year smoking history. He has never used smokeless tobacco. He reports that he does not drink alcohol and does not use drugs.  Tobacco Cessation: Counseling given: Not Answered Tobacco comments: pt quit smoking!! 11/19/15 Former smoker, quit 2017 with a 67.5 pack year smoking history  Past surgical hx, Family hx, Social hx all reviewed.  Current Outpatient Medications on File Prior to Visit  Medication Sig   atorvastatin  (LIPITOR ) 80 MG tablet Take 1 tablet (80 mg total) by mouth daily.   azithromycin  (ZITHROMAX ) 250 MG tablet TAKE 1 TABLET BY MOUTH DAILY   benralizumab  (FASENRA  PEN) 30 MG/ML prefilled autoinjector Inject 1 mL (30 mg total) into the skin every 8 (eight) weeks.   Cyanocobalamin  1000 MCG CAPS Take 1,000 mcg by mouth 2 (two) times a week.   diltiazem  (CARDIZEM  CD) 180 MG 24 hr capsule TAKE 1 CAPSULE(180 MG) BY MOUTH DAILY   ELIQUIS  5 MG TABS tablet TAKE 1 TABLET BY MOUTH TWICE DAILY   ferrous sulfate 325 (65 FE) MG EC tablet Take 325 mg by mouth daily.   ipratropium-albuterol  (DUONEB) 0.5-2.5 (3) MG/3ML SOLN USE 1 VIAL VIA NEBULIZER THREE TIMES DAILY   levothyroxine (SYNTHROID) 50 MCG tablet Take 50 mcg by mouth every morning.   losartan  (COZAAR ) 50 MG tablet TAKE 1 TABLET(50 MG) BY MOUTH DAILY   montelukast  (SINGULAIR ) 10 MG tablet Take 1 tablet (10 mg total) by mouth at bedtime.   nitroGLYCERIN  (NITROSTAT ) 0.4 MG SL tablet Place 1 tablet under the tongue every 5 (five) minutes as needed for chest pain.   Respiratory Therapy Supplies (FLUTTER) DEVI 1 Device by Does not apply route in the morning and at bedtime.   TRELEGY ELLIPTA  200-62.5-25 MCG/ACT AEPB INHALE 1  PUFF INTO THE LUNGS DAILY   VENTOLIN  HFA 108 (90 Base) MCG/ACT inhaler INHALE 2 PUFFS BY MOUTH EVERY 6 HOURS AS NEEDED FOR WHEEZING OR SHORTNESS OF BREATH   fluticasone  (FLONASE ) 50 MCG/ACT nasal spray SHAKE LIQUID AND USE 2 SPRAYS IN EACH NOSTRIL TWICE DAILY (Patient not taking: Reported on 09/18/2024)   ipratropium (ATROVENT ) 0.03 % nasal spray Place 2 sprays into both nostrils every 12 (twelve) hours. (Patient not taking: Reported on 09/18/2024)   No current facility-administered medications on file prior to visit.     Allergies[2]  Review Of Systems:  Constitutional:   No  weight loss, night sweats,  Fevers, chills, fatigue, or  lassitude.  HEENT:  No headaches,  Difficulty swallowing,  Tooth/dental problems, or  Sore throat,                No sneezing, itching, ear ache, nasal congestion, post nasal drip,   CV:  No chest pain,  Orthopnea, PND, swelling in lower extremities, anasarca, dizziness, palpitations, syncope.   GI  No heartburn, indigestion, abdominal pain, nausea, vomiting, diarrhea, change in bowel habits, loss of appetite, bloody stools.   Resp: + shortness of breath with exertion less  at rest.  No excess mucus, no productive cough,  No non-productive cough,  No coughing up of blood.  No change in color of mucus.  No wheezing.  No chest wall deformity  Skin: no rash or lesions.  GU: no dysuria, change in color of urine, no urgency or frequency.  No flank pain, no hematuria   MS:  No joint pain or swelling.  No decreased range of motion.  No back pain.  Psych:  No change in mood or affect. No depression or anxiety.  No memory loss.   Vital Signs BP 107/67   Pulse 76   Temp 98.2 F (36.8 C) (Oral)   Ht 5' 9 (1.753 m)   Wt 184 lb 6.4 oz (83.6 kg)   SpO2 93%   BMI 27.23 kg/m    Physical Exam: Physical Exam MEASUREMENTS: Weight- <200. GENERAL: No distress, alert and oriented times 3. EARS NOSE THROAT: No sinus tenderness, tympanic membranes clear, pale  nasal mucosa, no oral exudate, no post nasal drip, no lymphadenopathy. CHEST: Lungs clear to auscultation bilaterally. No wheeze, rales, dullness, no accessory muscle use, no nasal flaring, no sternal retractions. CARDIAC: S1, S2, regular rate and rhythm, no murmur. ABDOMINAL: Soft, non tender. ND, BS present. EXTREMITIES: No clubbing, cyanosis, edema. No obvious deformities. NEUROLOGICAL: Normal strength. Alert and oriented x 3, MAE x 4. SKIN: No rashes, warm and dry. No obvious skin lesions. PSYCHIATRIC: Normal mood and behavior.   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 PET scan negative for lymph node involvement.  Possible  interstitial tumor spread.  Discussed treatment options: surgery or radiation therapy. Explained non-small cell lung cancer characteristics and treatment urgency. - Ordered pulmonary function tests to assess surgical candidacy. - Ordered MRI of the brain for complete staging. - Referred to thoracic surgery for consultation. - Referred to radiation oncology for consultation.  Hemophilus influenzae pulmonary infection Culture indicated rare H. influenzae. Azithromycin  resistance noted. - Prescribed Augmentin , one tablet in the morning and one tablet in the evening for 7 days. - Advised taking a probiotic with antibiotics.  Chronic obstructive pulmonary disease Moderately severe obstructive disease noted in previous tests. - Ordered pulmonary function tests to assess current lung function.  Unintentional weight loss Significant weight loss of 15-20 pounds over the past year.  Avoidance of vitamin K supplements due to Eliquis . - Recommended Fairlife protein shakes as a nutritional supplement. - Advised avoiding supplements with vitamin K due to Eliquis  use.  I spent 40 minutes dedicated to the care of this patient on the date of  this encounter to include pre-visit review of records, face-to-face time with the patient discussing conditions above, post visit ordering of testing, clinical documentation with the electronic health record, making appropriate referrals as documented, and communicating necessary information to the patient's healthcare team.      Lauraine JULIANNA Lites, NP 09/18/2024  2:18 PM             [  1]  Social History Tobacco Use   Smoking status: Former    Current packs/day: 0.00    Average packs/day: 1.5 packs/day for 45.0 years (67.5 ttl pk-yrs)    Types: Cigarettes    Start date: 11/18/1970    Quit date: 11/19/2015    Years since quitting: 8.8   Smokeless tobacco: Never   Tobacco comments:    pt quit smoking!! 11/19/15  Vaping Use   Vaping status: Never Used  Substance Use Topics   Alcohol use: No    Alcohol/week: 0.0 standard drinks of alcohol   Drug use: No  [2]  Allergies Allergen Reactions   Morphine Other (See Comments)   Morphine And Codeine     HALLUCINATIONS   "

## 2024-09-18 NOTE — Patient Instructions (Signed)
 It is good to see you today. I am glad you have done well after your biopsy. You did have rare H flu in you cultures, so we will treat with Augmentin . Take 1 tablet in the morning and one on the evening for 7 days. Take probiotic with antibiotic.( Culturelle or Align, or Activia Yogurt) Your biopsy has resulted as squamous cell lung cancer. This is a non small cell lung cancer . I have referred you to both radiation oncology and thoracic surgery. You will get a call to get these consults scheduled. Once you hear about the different options for treatment , you can determine which is the best option for you. I have ordered Pulmonary Function Tests to determine if you are a surgical candidate . We will see if we can get these scheduled today. I have also ordered an MR brain . You will get a call to get this scheduled.  This will complete you staging. If you need medical oncology , you will be referred by one of these doctors.  Please add Fair life Protein drinks to see if we can gain some weight . Make sure your supplements do not have Vitamin K as part of the nutritional additives.  Call if you need us  sooner. Please contact office for sooner follow up if symptoms do not improve or worsen or seek emergency care

## 2024-09-21 ENCOUNTER — Ambulatory Visit (INDEPENDENT_AMBULATORY_CARE_PROVIDER_SITE_OTHER)

## 2024-09-21 DIAGNOSIS — C3431 Malignant neoplasm of lower lobe, right bronchus or lung: Secondary | ICD-10-CM | POA: Diagnosis not present

## 2024-09-21 LAB — PULMONARY FUNCTION TEST
DL/VA % pred: 84 %
DL/VA: 3.43 ml/min/mmHg/L
DLCO cor % pred: 69 %
DLCO cor: 16.82 ml/min/mmHg
DLCO unc % pred: 65 %
DLCO unc: 15.84 ml/min/mmHg
FEF 25-75 Post: 0.8 L/s
FEF 25-75 Pre: 0.89 L/s
FEF2575-%Change-Post: -10 %
FEF2575-%Pred-Post: 36 %
FEF2575-%Pred-Pre: 40 %
FEV1-%Change-Post: -3 %
FEV1-%Pred-Post: 57 %
FEV1-%Pred-Pre: 60 %
FEV1-Post: 1.7 L
FEV1-Pre: 1.76 L
FEV1FVC-%Change-Post: -2 %
FEV1FVC-%Pred-Pre: 75 %
FEV6-%Change-Post: -1 %
FEV6-%Pred-Post: 81 %
FEV6-%Pred-Pre: 82 %
FEV6-Post: 3.08 L
FEV6-Pre: 3.13 L
FEV6FVC-%Change-Post: -1 %
FEV6FVC-%Pred-Post: 103 %
FEV6FVC-%Pred-Pre: 104 %
FVC-%Change-Post: 0 %
FVC-%Pred-Post: 78 %
FVC-%Pred-Pre: 79 %
FVC-Post: 3.17 L
FVC-Pre: 3.2 L
Post FEV1/FVC ratio: 53 %
Post FEV6/FVC ratio: 97 %
Pre FEV1/FVC ratio: 55 %
Pre FEV6/FVC Ratio: 98 %
RV % pred: 257 %
RV: 6.14 L
TLC % pred: 141 %
TLC: 9.38 L

## 2024-09-21 NOTE — Progress Notes (Signed)
 Full pft performed today

## 2024-09-21 NOTE — Patient Instructions (Signed)
 Full pft performed today

## 2024-09-25 ENCOUNTER — Ambulatory Visit (HOSPITAL_COMMUNITY)
Admission: RE | Admit: 2024-09-25 | Discharge: 2024-09-25 | Disposition: A | Discharge status: Home / Self Care | Source: Ambulatory Visit | Attending: Acute Care | Admitting: Acute Care

## 2024-09-25 DIAGNOSIS — C3490 Malignant neoplasm of unspecified part of unspecified bronchus or lung: Secondary | ICD-10-CM | POA: Diagnosis present

## 2024-09-25 MED ORDER — GADOBUTROL 1 MMOL/ML IV SOLN
8.0000 mL | Freq: Once | INTRAVENOUS | Status: AC | PRN
  Administered 2024-09-25: 8 mL via INTRAVENOUS

## 2024-09-30 ENCOUNTER — Ambulatory Visit: Admitting: Acute Care

## 2024-10-01 NOTE — Progress Notes (Signed)
 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 C34.31    HISTORY OF PRESENT ILLNESS: MAXIM BEDEL is a 74 y.o. male seen in consultation for radiation therapy.  He is a prior smoker with a hx of COPD and chronic lung nodules. He has been followed with low dose CT scans for lung cancer screening. He was noted to have a new and enlarging RLL nodule on a screening CT in August 2025. Subsequent imaging demonstrated marked interval progression to a mass-like consolidation in the superior segment of the RLL w/ endobronchial involvement, crossing the major fissure, with associated postobstructive changes. PET on 09/10/24 demonstrated a hypermetabolic R infrahilar/RLL mass (SUV ma 13.5) without evidence of mediastinal, hilar or distant metastatic disease.  He underwent navigational bronchoscopy on 09/11/24 which was notable for minor self-limited post procedural bleeding. He is on long-term anticoagulation with Eliquis . 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 The lesion was partially debulked at the time of bronchoscopy.  Of note, patient has had a 15-20 lb weight loss in the last 6-12 months without change in appetite. He has not had fevers or hemoptysis.   PREVIOUS RADIATION THERAPY: {EXAM; YES/NO:19492::No}  AUTOIMMUNE DISEASE: {EXAM; YES/NO:19492::No}  MEDICAL DEVICES: {EXAM; YES/NO:19492::No}  PREGNANCY: {EXAM; YES/NO:19492::No}   PAST MEDICAL HISTORY:  Past Medical History:  Diagnosis Date   Anemia    Hx of anemia x 4 years ago   COPD (chronic obstructive pulmonary disease) (HCC)    Disturbance of skin sensation 06/24/2013   Dysrhythmia    Elevated  cholesterol    History of echocardiogram    Echo 4/17: EF 60-65%, no RWMA, Gr 2 DD   Hypertension    Hypothyroidism    Sleep disturbance, unspecified 06/24/2013   Stroke [facility])        PAST SURGICAL HISTORY: Past Surgical History:  Procedure Laterality Date   BRONCHIAL BIOPSY  09/11/2024   Procedure: BRONCHOSCOPY, WITH BIOPSY;  Surgeon: Kara Dorn NOVAK, MD;  Location: [facility] ENDOSCOPY;  Service: Pulmonary;;   BRONCHIAL BRUSHINGS  09/11/2024   Procedure: BRONCHOSCOPY, WITH BRUSH BIOPSY;  Surgeon: Kara Dorn NOVAK, MD;  Location: [facility] ENDOSCOPY;  Service: Pulmonary;;   BRONCHIAL NEEDLE ASPIRATION BIOPSY  09/11/2024   Procedure: BRONCHOSCOPY, WITH NEEDLE ASPIRATION BIOPSY;  Surgeon: Kara Dorn NOVAK, MD;  Location: [facility] ENDOSCOPY;  Service: Pulmonary;;   ESOPHAGOGASTRODUODENOSCOPY N/A 01/08/2016   Procedure: ESOPHAGOGASTRODUODENOSCOPY (EGD);  Surgeon: Lynwood Bohr, MD;  Location: [facility] ENDOSCOPY;  Service: Endoscopy;  Laterality: N/A;   fractured pelvis     INGUINAL HERNIA REPAIR Bilateral    LEFT HEART CATH AND CORONARY ANGIOGRAPHY N/A 08/30/2017   Procedure: LEFT HEART CATH AND CORONARY ANGIOGRAPHY;  Surgeon: Verlin Lonni BIRCH, MD;  Location: [facility] INVASIVE CV LAB;  Service: Cardiovascular;  Laterality: N/A;   reconstruct left heel  2002   TONSILLECTOMY     VIDEO BRONCHOSCOPY WITH ENDOBRONCHIAL NAVIGATION Right 09/11/2024   Procedure: VIDEO BRONCHOSCOPY WITH ENDOBRONCHIAL NAVIGATION;  Surgeon: Kara Dorn NOVAK, MD;  Location: [facility] ENDOSCOPY;  Service: Pulmonary;  Laterality: Right;     FAMILY HISTORY:  Family History  Problem Relation Age of Onset   Diabetes Mother  Prostate cancer Father    Kidney failure Father    Alcoholism Sister        history of   Drug abuse Sister        history of   Stroke Neg Hx      SOCIAL HISTORY:  reports that he quit smoking about 8 years ago. His smoking use included cigarettes. He started smoking about 53 years ago. He has a 67.5 pack-year  smoking history. He has never used smokeless tobacco. He reports that he does not drink alcohol and does not use drugs.   ALLERGIES: Morphine and Morphine and codeine   MEDICATIONS:  Current Outpatient Medications  Medication Sig Dispense Refill   amoxicillin -clavulanate (AUGMENTIN ) 875-125 MG tablet Take 1 tablet by mouth 2 (two) times daily. 14 tablet 0   atorvastatin  (LIPITOR ) 80 MG tablet Take 1 tablet (80 mg total) by mouth daily. 90 tablet 3   azithromycin  (ZITHROMAX ) 250 MG tablet TAKE 1 TABLET BY MOUTH DAILY 30 tablet 5   benralizumab  (FASENRA  PEN) 30 MG/ML prefilled autoinjector Inject 1 mL (30 mg total) into the skin every 8 (eight) weeks. 1 mL 2   Cyanocobalamin  1000 MCG CAPS Take 1,000 mcg by mouth 2 (two) times a week.     diltiazem  (CARDIZEM  CD) 180 MG 24 hr capsule TAKE 1 CAPSULE(180 MG) BY MOUTH DAILY 90 capsule 2   ELIQUIS  5 MG TABS tablet TAKE 1 TABLET BY MOUTH TWICE DAILY 60 tablet 5   ferrous sulfate 325 (65 FE) MG EC tablet Take 325 mg by mouth daily.     fluticasone  (FLONASE ) 50 MCG/ACT nasal spray SHAKE LIQUID AND USE 2 SPRAYS IN EACH NOSTRIL TWICE DAILY (Patient not taking: Reported on 09/18/2024) 16 g 3   ipratropium (ATROVENT ) 0.03 % nasal spray Place 2 sprays into both nostrils every 12 (twelve) hours. (Patient not taking: Reported on 09/18/2024) 30 mL 12   ipratropium-albuterol  (DUONEB) 0.5-2.5 (3) MG/3ML SOLN USE 1 VIAL VIA NEBULIZER THREE TIMES DAILY 360 mL 6   levothyroxine (SYNTHROID) 50 MCG tablet Take 50 mcg by mouth every morning.     losartan  (COZAAR ) 50 MG tablet TAKE 1 TABLET(50 MG) BY MOUTH DAILY 90 tablet 2   montelukast  (SINGULAIR ) 10 MG tablet Take 1 tablet (10 mg total) by mouth at bedtime. 30 tablet 11   nitroGLYCERIN  (NITROSTAT ) 0.4 MG SL tablet Place 1 tablet under the tongue every 5 (five) minutes as needed for chest pain.     Respiratory Therapy Supplies (FLUTTER) DEVI 1 Device by Does not apply route in the morning and at bedtime. 1 each 0    TRELEGY ELLIPTA  200-62.5-25 MCG/ACT AEPB INHALE 1 PUFF INTO THE LUNGS DAILY 60 each 6   VENTOLIN  HFA 108 (90 Base) MCG/ACT inhaler INHALE 2 PUFFS BY MOUTH EVERY 6 HOURS AS NEEDED FOR WHEEZING OR SHORTNESS OF BREATH 18 g 1   No current facility-administered medications for this visit.     REVIEW OF SYSTEMS: The patient reports that they are doing well generally. Pertinent ROS per HPI and otherwise negative.    PHYSICAL EXAM:  Wt Readings from Last 3 Encounters:  09/18/24 184 lb 6.4 oz (83.6 kg)  09/11/24 185 lb (83.9 kg)  09/01/24 185 lb (83.9 kg)   Temp Readings from Last 3 Encounters:  09/18/24 98.2 F (36.8 C) (Oral)  09/11/24 98.5 F (36.9 C)  11/16/22 98.1 F (36.7 C) (Oral)   BP Readings from Last 3 Encounters:  09/18/24 107/67  09/11/24 101/60  09/01/24 131/74  Pulse Readings from Last 3 Encounters:  09/18/24 76  09/11/24 64  09/01/24 82    /10   Physical Exam   ECOG = ***   LABORATORY DATA:  Lab Results  Component Value Date   WBC 8.3 09/03/2024   HGB 12.7 (L) 09/03/2024   HCT 40.8 09/03/2024   MCV 86 09/03/2024   PLT 420 09/03/2024   Lab Results  Component Value Date   NA 140 09/03/2024   K 4.4 09/03/2024   CL 101 09/03/2024   CO2 22 09/03/2024   Lab Results  Component Value Date   ALT 21 06/01/2020   AST 19 06/01/2020   ALKPHOS 103 06/01/2020   BILITOT 0.6 06/01/2020      RADIOGRAPHY:   PET 09/10/24:  FINDINGS: Mediastinal blood pool activity: SUV max 2.5   Liver activity: SUV max NA   NECK: No hypermetabolic lymph nodes in the neck.   Incidental CT findings: Bilateral carotid artery calcifications.   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 There is significant surrounding airspace process which is likely postobstructive pneumonia. Interstitial spread of tumor is also possible. Obstructed bronchi extending posteriorly could be filled with fluid  or tumor. This area has an SUV max of 6.4 and appears to extend right to the pleural surface.   No enlarged or hypermetabolic mediastinal nodes. No supraclavicular or axillary adenopathy. No pulmonary nodules to suggest pulmonary metastatic disease.   Incidental CT findings: Stable aortic and three-vessel coronary artery calcifications.   ABDOMEN/PELVIS: No abnormal hypermetabolic activity within the liver, pancreas, adrenal glands, or spleen. No hypermetabolic lymph nodes in the abdomen or pelvis.   Incidental CT findings: Stable cholelithiasis. Extensive calcifications in the pancreatic body and tail regions likely related to prior pancreatitis. Stable advanced atherosclerotic calcifications involving the aorta and iliac arteries. Slight interval enlargement of the abdominal aortic aneurysm. This measures 4.1 x 4.7 cm and previously measured 3.9 x 4.5 cm. Stable severe sigmoid colon diverticulosis.   SKELETON: No findings suspicious for osseous metastatic disease.   Incidental CT findings: Stable surgical changes at the pubic symphysis with metallic artifact. Evidence of prior healed left sacral fracture.   IMPRESSION: 1. 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 There is significant surrounding airspace process which is likely postobstructive pneumonia. Interstitial spread of tumor is also possible. 2. No enlarged or hypermetabolic mediastinal or hilar lymph nodes. 3. No findings for pulmonary metastatic disease or abdominal/pelvic metastatic disease. 4. Slight interval enlargement of the abdominal aortic aneurysm. Recommend follow-up CT/MR every 6 months and vascular consultation. 5. Stable cholelithiasis. 6. Stable advanced atherosclerotic calcifications involving the aorta and iliac arteries. 7. Aortic atherosclerosis.    CT Chest LCS Nodule FU 08/28/24:  FINDINGS: Cardiovascular: Atherosclerotic calcification of  the aorta, aortic valve and coronary arteries. Heart size normal. No pericardial effusion.   Mediastinum/Nodes: No pathologically enlarged mediastinal or axillary lymph nodes. Hilar regions are difficult to definitively evaluate without IV contrast. Bilateral gynecomastia. Esophagus is grossly unremarkable.   Lungs/Pleura: Centrilobular and paraseptal emphysema. Masslike consolidation in the superior segment right lower lobe measures at least 66.8 mm, increased from 18.2 mm, and appears to cross the right major fissure, into the right middle lobe. Debris is seen in the right lower lobe bronchus with endobronchial debris in the basal segmental bronchi. Extensive centrilobular nodularity in the right lower lobe. No pleural fluid. Airway is unremarkable.   Upper Abdomen: Calcifications in  the tail the pancreas. Visualized portions of the liver, adrenal glands, kidneys, spleen, pancreas, stomach and bowel are otherwise grossly unremarkable. No upper abdominal adenopathy.   Musculoskeletal: Degenerative changes in the spine.   IMPRESSION: 1. Right lower lobe mass crosses the right major fissure and is markedly enlarged from 05/15/2024. Lung-RADS 4X, highly suspicious. Additional imaging evaluation or consultation with Pulmonology or Thoracic Surgery recommended. These results will be called to the ordering clinician or representative by the Radiologist Assistant, and communication documented in the PACS or [employer]. 2. Obstruction of the superior segmental right lower lobe bronchus with endobronchial debris in the basal segmental bronchi and endobronchial nodularity, findings which may be postobstructive in etiology. Lymphangitic carcinomatosis cannot be definitively excluded. 3. Chronic calcific pancreatitis. 4. Aortic atherosclerosis (ICD10-I70.0). Coronary artery calcification. 5.  Emphysema (ICD10-J43.9).     CT Chest Lung Cancer Screening  05/30/24:  FINDINGS: Cardiovascular: Normal heart size. No significant pericardial effusion/thickening. Three-vessel coronary atherosclerosis. Atherosclerotic thoracic aorta with dilated 4.0 cm ascending thoracic aorta. Normal caliber pulmonary arteries.   Mediastinum/Nodes: No significant thyroid  nodules. Unremarkable esophagus. No pathologically enlarged axillary, mediastinal or hilar lymph nodes, noting limited sensitivity for the detection of hilar adenopathy on this noncontrast study.   Lungs/Pleura: No pneumothorax. No pleural effusion. Mild paraseptal and centrilobular emphysema with diffuse bronchial wall thickening. No acute consolidative airspace disease or lung masses. Previously visualized scattered pulmonary nodules are stable, largest a 10 mm perifissural nodule along the minor fissure on series 4/image 182. Extensive peribronchovascular and centrilobular nodularity throughout the right lower lobe, substantially worsened from prior chest CT with new dominant 22 mm right lower lobe nodule on image 195. The right lower lobe airways are largely replaced by dense patchy material. Chronic mild patchy tree-in-bud opacity in the anteromedial left upper lobe and medial left lower lobe, unchanged.   Upper abdomen: Cholelithiasis. Chronic calcific pancreatitis is unchanged.   Musculoskeletal: No aggressive appearing focal osseous lesions. Moderate thoracic spondylosis.   IMPRESSION: 1. Lung-RADS 4B, suspicious. Additional imaging evaluation or consultation with Pulmonology or Thoracic Surgery recommended. Extensive peribronchovascular and centrilobular nodularity throughout the right lower lobe, substantially worsened from prior chest CT with new dominant 22 mm right lower lobe nodule. The right lower lobe airways are largely replaced by dense patchy material. Similar chronic mild tree-in-bud opacities in the left upper and left lower lobes. Findings may represent acute  worsening of chronic/recurrent infectious bronchiolitis such as due to atypical mycobacterial infection or recurrent aspiration, however primary bronchogenic malignancy cannot be excluded in the right lower lobe. Pulmonology consultation advised. Suggest short-term follow-up noncontrast chest CT in 3 months at a minimum. 2. Three-vessel coronary atherosclerosis. 3. Cholelithiasis. 4. Chronic calcific pancreatitis. 5. Aortic Atherosclerosis (ICD10-I70.0) and Emphysema (ICD10-J43.9).    CT Chest LCS Nodule FU 05/17/23:  FINDINGS: Cardiovascular: Atherosclerotic calcification of the aorta, aortic valve and coronary arteries. Heart size normal. No pericardial effusion. Ascending aorta measures up to 3.8 cm (6/115).   Mediastinum/Nodes: No pathologically enlarged mediastinal or axillary lymph nodes. Hilar regions are difficult to definitively evaluate without IV contrast. Esophagus is grossly unremarkable.   Lungs/Pleura: Centrilobular and paraseptal emphysema. Peribronchial thickening, scattered mucoid impaction and peribronchovascular nodularity, as before. Pulmonary nodules measure 9.8 mm or less in size, as before. No new pulmonary nodules. No pleural fluid. Debris is seen in the airway.   Upper Abdomen: Visualized portion of the liver is grossly unremarkable. Gallstones. Visualized portions of the adrenal glands, kidneys and spleen are grossly unremarkable. Chronic calcific pancreatitis. Visualized portions of  the stomach and bowel are grossly unremarkable. No upper abdominal adenopathy.   Musculoskeletal: Degenerative changes in the spine.   IMPRESSION: 1. Lung-RADS 2, benign appearance or behavior. Continue annual screening with low-dose chest CT without contrast in 12 months. 2. Cholelithiasis. 3. Chronic calcific pancreatitis. 4. Aortic atherosclerosis (ICD10-I70.0). Coronary artery calcification. 5.  Emphysema (ICD10-J43.9).     PATHOLOGY:    Bronchoscopy with  biopsy 09/11/24:  LUNG, RIGHT LOWER LOBE, ENDOBRONCHIAL, BIOPSY:       Squamous cell carcinoma.   LUNG, RLL ENDOBRONCHIAL LESION , BIOPSY:  - Atypical cells present   LUNG, RLL ENDOBRONCHIAL LESION, BRUSHING:  - Atypical cells present   LUNG, RLL, FINE NEEDLE ASPIRATION:  - Squamous cell carcinoma.    IMPRESSION/PLAN:  Mr. 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  The patient was seen to discuss management of early-stage non-small cell lung cancer (NSCLC). We reviewed that surgical resection is the standard of care for operable patients. However, some patients are not ideal surgical candidates due to limited pulmonary function, significant comorbidities, or personal preference. For these individuals, radiation, preferably stereotactic body radiation therapy (SBRT) provides an effective, non-invasive alternative.  We discussed the logistics of radiation and SBRT in detail. The first step is a simulation CT scan, which includes 4D CT imaging to account for breathing motion. Immobilization devices are used for comfort and reproducibility. The simulation appointment lasts approximately 45 minutes. Treatment planning then takes about one week, during which the radiation oncology team contours the tumor and normal structures and develops a safe, precise treatment plan.  Thoracic radiation courses vary in length. SBRT is typically delivered over 5 treatments. Each visit lasts about 30-45 minutes, though the actual beam delivery time is only several minutes. The patient will be seen weekly by the radiation oncology team to monitor tolerance and address any side effects. In the case of a 5 fraction treatment, the patient is seen once unless the need arises for them to be seen additionally.   Potential side effects were reviewed, including cough, shortness of breath, fatigue, transient chest wall discomfort, or late rib  fracture depending on tumor location. We also discussed the risk of radiation pneumonitis, which may occur weeks to months after treatment and is usually manageable with corticosteroids. Rare risks include injury to the heart, spinal cord, or major airways, depending on proximity to the target. Overall, radiation is generally well tolerated with a low risk of significant toxicity.  Post-treatment follow-up will include a clinic visit and chest CT approximately 3 months after SBRT, then every 3-6 months for the first 2 years, and every 6-12 months thereafter to monitor for recurrence and lung function. Adjuvant systemic therapy may be indicated and will be per medical oncology.  The patient has a consult with surgery next Friday. Final treatment decisions pending this consult. To avoid delay in treatment, will go ahead and schedule for the Monday after his surgical consult in the case that he opts to proceed with radiation. If he decides to proceed with surgery we can cancel this appt.  The patient was provided with contact information for the care team and encouraged to reach out with any questions or concerns.  -Simulation scheduled for *** with plan to start radiation the following week of ***  pending surgical consult  Has already completed staging Brain MRI. Read not complete but no mass lesions per my review.  Total time spent today  in preparation for this visit was *** minutes. This included patient care, imaging and path review, documentation, multidisciplinary discussion and coordination of care and follow up.    Estefana HERO. Maritza, M.D.   "

## 2024-10-02 ENCOUNTER — Ambulatory Visit
Admission: RE | Admit: 2024-10-02 | Discharge: 2024-10-02 | Disposition: A | Discharge status: Home / Self Care | Source: Ambulatory Visit | Attending: Radiation Oncology | Admitting: Radiation Oncology

## 2024-10-02 ENCOUNTER — Encounter: Payer: Self-pay | Admitting: Radiation Oncology

## 2024-10-02 VITALS — BP 120/71 | HR 61 | Temp 97.6°F | Resp 20 | Ht 69.0 in | Wt 183.6 lb

## 2024-10-02 DIAGNOSIS — Z79899 Other long term (current) drug therapy: Secondary | ICD-10-CM | POA: Diagnosis not present

## 2024-10-02 DIAGNOSIS — Z7901 Long term (current) use of anticoagulants: Secondary | ICD-10-CM | POA: Diagnosis not present

## 2024-10-02 DIAGNOSIS — I714 Abdominal aortic aneurysm, without rupture, unspecified: Secondary | ICD-10-CM | POA: Insufficient documentation

## 2024-10-02 DIAGNOSIS — E7800 Pure hypercholesterolemia, unspecified: Secondary | ICD-10-CM | POA: Insufficient documentation

## 2024-10-02 DIAGNOSIS — Z79890 Hormone replacement therapy (postmenopausal): Secondary | ICD-10-CM | POA: Diagnosis not present

## 2024-10-02 DIAGNOSIS — I700 Atherosclerosis of aorta: Secondary | ICD-10-CM | POA: Diagnosis not present

## 2024-10-02 DIAGNOSIS — C3431 Malignant neoplasm of lower lobe, right bronchus or lung: Secondary | ICD-10-CM | POA: Diagnosis present

## 2024-10-02 DIAGNOSIS — Z8673 Personal history of transient ischemic attack (TIA), and cerebral infarction without residual deficits: Secondary | ICD-10-CM | POA: Insufficient documentation

## 2024-10-02 DIAGNOSIS — I10 Essential (primary) hypertension: Secondary | ICD-10-CM | POA: Insufficient documentation

## 2024-10-02 DIAGNOSIS — Z8042 Family history of malignant neoplasm of prostate: Secondary | ICD-10-CM | POA: Insufficient documentation

## 2024-10-02 DIAGNOSIS — G479 Sleep disorder, unspecified: Secondary | ICD-10-CM | POA: Insufficient documentation

## 2024-10-02 DIAGNOSIS — J439 Emphysema, unspecified: Secondary | ICD-10-CM | POA: Diagnosis not present

## 2024-10-02 DIAGNOSIS — R5383 Other fatigue: Secondary | ICD-10-CM | POA: Insufficient documentation

## 2024-10-02 DIAGNOSIS — I2510 Atherosclerotic heart disease of native coronary artery without angina pectoris: Secondary | ICD-10-CM | POA: Insufficient documentation

## 2024-10-02 DIAGNOSIS — K8020 Calculus of gallbladder without cholecystitis without obstruction: Secondary | ICD-10-CM | POA: Diagnosis not present

## 2024-10-02 DIAGNOSIS — E039 Hypothyroidism, unspecified: Secondary | ICD-10-CM | POA: Insufficient documentation

## 2024-10-02 DIAGNOSIS — K861 Other chronic pancreatitis: Secondary | ICD-10-CM | POA: Insufficient documentation

## 2024-10-02 DIAGNOSIS — J449 Chronic obstructive pulmonary disease, unspecified: Secondary | ICD-10-CM | POA: Diagnosis not present

## 2024-10-02 DIAGNOSIS — R634 Abnormal weight loss: Secondary | ICD-10-CM | POA: Diagnosis not present

## 2024-10-02 NOTE — Progress Notes (Signed)
 Location of tumor and Histology per Pathology Report:  Primary malignant neoplasm of right lower lobe of lung  Biopsy:    Anticipated interventions by surgeon, if any:  Right VIDEO BRONCHOSCOPY WITH ENDOBRONCHIAL NAVIGATION with Kara Dorn NOVAK, MD  Past/Anticipated interventions by medical oncology, if any:     Pain issues, if any:  Denies  SAFETY ISSUES: Prior radiation? No Pacemaker/ICD? No Possible current pregnancy:No Is the patient on methotrexate? No  Current Complaints / other details:        BP 120/71 (BP Location: Right Arm, Patient Position: Sitting, Cuff Size: Large)   Pulse 61   Temp 97.6 F (36.4 C)   Resp 20   Ht 5' 9 (1.753 m)   Wt 183 lb 9.6 oz (83.3 kg)   SpO2 93%   BMI 27.11 kg/m

## 2024-10-05 NOTE — Progress Notes (Unsigned)
 "     [address] [place] [address]             [phone]                   Nicholas ENOCHS  [facility] Medical Record #[id number] Date of Birth: 1951-02-02  Referring: Kip Righter, MD Primary Care: Kip Righter, MD Primary Cardiologist: None  Chief Complaint:   No chief complaint on file.   History of Present Illness:    Nicholas Camacho is a 74 y.o. male who presents for surgical evaluation of a ***    Smoking Hx: *** Zubrod Score: At the time of surgery this patients most appropriate activity status/level should be described as: []     0    Normal activity, no symptoms []     1    Restricted in physical strenuous activity but ambulatory, able to do out light work []     2    Ambulatory and capable of self care, unable to do work activities, up and about               >50 % of waking hours                              []     3    Only limited self care, in bed greater than 50% of waking hours []     4    Completely disabled, no self care, confined to bed or chair []     5    Moribund     Past Medical History:  Diagnosis Date   Anemia    Hx of anemia x 4 years ago   COPD (chronic obstructive pulmonary disease) (HCC)    Disturbance of skin sensation 06/24/2013   Dysrhythmia    Elevated cholesterol    History of echocardiogram    Echo 4/17: EF 60-65%, no RWMA, Gr 2 DD   Hypertension    Hypothyroidism    Sleep disturbance, unspecified 06/24/2013   Stroke [facility])     Past Surgical History:  Procedure Laterality Date   BRONCHIAL BIOPSY  09/11/2024   Procedure: BRONCHOSCOPY, WITH BIOPSY;  Surgeon: Kara Dorn NOVAK, MD;  Location: [facility] ENDOSCOPY;  Service: Pulmonary;;   BRONCHIAL BRUSHINGS  09/11/2024   Procedure: BRONCHOSCOPY, WITH BRUSH BIOPSY;  Surgeon: Kara Dorn NOVAK, MD;  Location: [facility] ENDOSCOPY;  Service: Pulmonary;;   BRONCHIAL NEEDLE ASPIRATION BIOPSY  09/11/2024   Procedure: BRONCHOSCOPY, WITH NEEDLE ASPIRATION BIOPSY;   Surgeon: Kara Dorn NOVAK, MD;  Location: [facility] ENDOSCOPY;  Service: Pulmonary;;   ESOPHAGOGASTRODUODENOSCOPY N/A 01/08/2016   Procedure: ESOPHAGOGASTRODUODENOSCOPY (EGD);  Surgeon: Lynwood Bohr, MD;  Location: [facility] ENDOSCOPY;  Service: Endoscopy;  Laterality: N/A;   fractured pelvis     INGUINAL HERNIA REPAIR Bilateral    LEFT HEART CATH AND CORONARY ANGIOGRAPHY N/A 08/30/2017   Procedure: LEFT HEART CATH AND CORONARY ANGIOGRAPHY;  Surgeon: Verlin Lonni BIRCH, MD;  Location: [facility] INVASIVE CV LAB;  Service: Cardiovascular;  Laterality: N/A;   reconstruct left heel  2002   TONSILLECTOMY     VIDEO BRONCHOSCOPY WITH ENDOBRONCHIAL NAVIGATION Right 09/11/2024   Procedure: VIDEO BRONCHOSCOPY WITH ENDOBRONCHIAL NAVIGATION;  Surgeon: Kara Dorn NOVAK, MD;  Location: [facility] ENDOSCOPY;  Service: Pulmonary;  Laterality: Right;    Family History  Problem Relation Age of Onset   Diabetes Mother    Prostate cancer Father  Kidney failure Father    Alcoholism Sister        history of   Drug abuse Sister        history of   Stroke Neg Hx      Tobacco Use History[1]  Social History   Substance and Sexual Activity  Alcohol Use No   Alcohol/week: 0.0 standard drinks of alcohol     Allergies[2]  Current Outpatient Medications  Medication Sig Dispense Refill   amoxicillin -clavulanate (AUGMENTIN ) 875-125 MG tablet Take 1 tablet by mouth 2 (two) times daily. 14 tablet 0   atorvastatin  (LIPITOR ) 80 MG tablet Take 1 tablet (80 mg total) by mouth daily. 90 tablet 3   azithromycin  (ZITHROMAX ) 250 MG tablet TAKE 1 TABLET BY MOUTH DAILY 30 tablet 5   benralizumab  (FASENRA  PEN) 30 MG/ML prefilled autoinjector Inject 1 mL (30 mg total) into the skin every 8 (eight) weeks. 1 mL 2   Cyanocobalamin  1000 MCG CAPS Take 1,000 mcg by mouth 2 (two) times a week.     diltiazem  (CARDIZEM  CD) 180 MG 24 hr capsule TAKE 1 CAPSULE(180 MG) BY MOUTH DAILY 90 capsule 2   ELIQUIS  5 MG TABS tablet TAKE 1 TABLET BY MOUTH  TWICE DAILY 60 tablet 5   ferrous sulfate 325 (65 FE) MG EC tablet Take 325 mg by mouth daily.     fluticasone  (FLONASE ) 50 MCG/ACT nasal spray SHAKE LIQUID AND USE 2 SPRAYS IN EACH NOSTRIL TWICE DAILY (Patient not taking: Reported on 09/18/2024) 16 g 3   ipratropium (ATROVENT ) 0.03 % nasal spray Place 2 sprays into both nostrils every 12 (twelve) hours. (Patient not taking: Reported on 10/02/2024) 30 mL 12   ipratropium-albuterol  (DUONEB) 0.5-2.5 (3) MG/3ML SOLN USE 1 VIAL VIA NEBULIZER THREE TIMES DAILY 360 mL 6   levothyroxine (SYNTHROID) 50 MCG tablet Take 50 mcg by mouth every morning.     losartan  (COZAAR ) 50 MG tablet TAKE 1 TABLET(50 MG) BY MOUTH DAILY 90 tablet 2   montelukast  (SINGULAIR ) 10 MG tablet Take 1 tablet (10 mg total) by mouth at bedtime. 30 tablet 11   nitroGLYCERIN  (NITROSTAT ) 0.4 MG SL tablet Place 1 tablet under the tongue every 5 (five) minutes as needed for chest pain.     Respiratory Therapy Supplies (FLUTTER) DEVI 1 Device by Does not apply route in the morning and at bedtime. 1 each 0   TRELEGY ELLIPTA  200-62.5-25 MCG/ACT AEPB INHALE 1 PUFF INTO THE LUNGS DAILY 60 each 6   VENTOLIN  HFA 108 (90 Base) MCG/ACT inhaler INHALE 2 PUFFS BY MOUTH EVERY 6 HOURS AS NEEDED FOR WHEEZING OR SHORTNESS OF BREATH 18 g 1   No current facility-administered medications for this visit.    ROS   PHYSICAL EXAMINATION: There were no vitals taken for this visit. Physical Exam       I have independently reviewed the above radiology studies  and reviewed the findings with the patient.   Recent Lab Findings: Lab Results  Component Value Date   WBC 8.3 09/03/2024   HGB 12.7 (L) 09/03/2024   HCT 40.8 09/03/2024   PLT 420 09/03/2024   GLUCOSE 105 (H) 09/03/2024   CHOL 88 06/03/2020   TRIG 82 06/03/2020   HDL 24 (L) 06/03/2020   LDLCALC 48 06/03/2020   ALT 21 06/01/2020   AST 19 06/01/2020   NA 140 09/03/2024   K 4.4 09/03/2024   CL 101 09/03/2024   CREATININE 0.91  09/03/2024   BUN 16 09/03/2024   CO2 22  09/03/2024   TSH 3.962 06/02/2020   INR 1.1 06/01/2020   HGBA1C 6.4 (H) 06/03/2020    Diagnostic Studies & Laboratory data:     Recent Radiology Findings:   DG Chest Port 1 View Result Date: 09/11/2024 CLINICAL DATA:  Right lung mass.  Status post bronchoscopy. EXAM: PORTABLE CHEST 1 VIEW COMPARISON:  06/08/2020 FINDINGS: Heart size is normal. Right infrahilar mass noted. Left lung is clear. No evidence of pneumothorax or pleural effusion. IMPRESSION: Right infrahilar mass. No evidence of pneumothorax. Electronically Signed   By: Norleen DELENA Kil M.D.   On: 09/11/2024 12:47   DG C-ARM BRONCHOSCOPY Result Date: 09/11/2024 C-ARM BRONCHOSCOPY: Fluoroscopy was utilized by the requesting physician.  No radiographic interpretation.   DG C-Arm 1-60 Min-No Report Result Date: 09/11/2024 Fluoroscopy was utilized by the requesting physician.  No radiographic interpretation.   DG C-Arm 1-60 Min-No Report Result Date: 09/11/2024 Fluoroscopy was utilized by the requesting physician.  No radiographic interpretation.   NM PET Image Initial (PI) Skull Base To Thigh Result Date: 09/10/2024 CLINICAL DATA:  Initial treatment strategy for right lower lobe lung mass. EXAM: NUCLEAR MEDICINE PET SKULL BASE TO THIGH TECHNIQUE: 9.59 mCi F-18 FDG was injected intravenously. Full-ring PET imaging was performed from the skull base to thigh after the radiotracer. CT data was obtained and used for attenuation correction and anatomic localization. Fasting blood glucose: 108 mg/dl COMPARISON:  Multiple prior chest CTs.  Prior PET-CT 11/12/2022 FINDINGS: Mediastinal blood pool activity: SUV max 2.5 Liver activity: SUV max NA NECK: No hypermetabolic lymph nodes in the neck. Incidental CT findings: Bilateral carotid artery calcifications. 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 There is significant  surrounding airspace process which is likely postobstructive pneumonia. Interstitial spread of tumor is also possible. Obstructed bronchi extending posteriorly could be filled with fluid or tumor. This area has an SUV max of 6.4 and appears to extend right to the pleural surface. No enlarged or hypermetabolic mediastinal nodes. No supraclavicular or axillary adenopathy. No pulmonary nodules to suggest pulmonary metastatic disease. Incidental CT findings: Stable aortic and three-vessel coronary artery calcifications. ABDOMEN/PELVIS: No abnormal hypermetabolic activity within the liver, pancreas, adrenal glands, or spleen. No hypermetabolic lymph nodes in the abdomen or pelvis. Incidental CT findings: Stable cholelithiasis. Extensive calcifications in the pancreatic body and tail regions likely related to prior pancreatitis. Stable advanced atherosclerotic calcifications involving the aorta and iliac arteries. Slight interval enlargement of the abdominal aortic aneurysm. This measures 4.1 x 4.7 cm and previously measured 3.9 x 4.5 cm. Stable severe sigmoid colon diverticulosis. SKELETON: No findings suspicious for osseous metastatic disease. Incidental CT findings: Stable surgical changes at the pubic symphysis with metallic artifact. Evidence of prior healed left sacral fracture. IMPRESSION: 1. 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 There is significant surrounding airspace process which is likely postobstructive pneumonia. Interstitial spread of tumor is also possible. 2. No enlarged or hypermetabolic mediastinal or hilar lymph nodes. 3. No findings for pulmonary metastatic disease or abdominal/pelvic metastatic disease. 4. Slight interval enlargement of the abdominal aortic aneurysm. Recommend follow-up CT/MR every 6 months and vascular consultation. 5. Stable cholelithiasis. 6. Stable advanced atherosclerotic calcifications involving the aorta and iliac  arteries. 7. Aortic atherosclerosis. Aortic Atherosclerosis (ICD10-I70.0). Electronically Signed   By: MYRTIS Stammer M.D.   On: 09/10/2024 19:17     PFTs:  - FVC: ***% - FEV1: ***% -DLCO: ***%    Assessment /  Plan:   74 y.o. male with ***    Good PFTs I  spent {CHL ONC TIME VISIT - DTPQU:[id number]} with  the patient face to face in counseling and coordination of care.    Linnie MALVA Rayas 10/05/2024 8:43 AM           [1]  Social History Tobacco Use  Smoking Status Former   Current packs/day: 0.00   Average packs/day: 1.5 packs/day for 45.0 years (67.5 ttl pk-yrs)   Types: Cigarettes   Start date: 11/18/1970   Quit date: 11/19/2015   Years since quitting: 8.8  Smokeless Tobacco Never  Tobacco Comments   pt quit smoking!! 11/19/15  [2]  Allergies Allergen Reactions   Morphine Other (See Comments)   Morphine And Codeine     HALLUCINATIONS   "

## 2024-10-08 ENCOUNTER — Other Ambulatory Visit: Payer: Self-pay

## 2024-10-09 ENCOUNTER — Encounter: Admitting: Thoracic Surgery (Cardiothoracic Vascular Surgery)

## 2024-10-09 ENCOUNTER — Other Ambulatory Visit (HOSPITAL_COMMUNITY): Payer: Self-pay

## 2024-10-09 ENCOUNTER — Ambulatory Visit: Admitting: Thoracic Surgery (Cardiothoracic Vascular Surgery)

## 2024-10-09 NOTE — Progress Notes (Signed)
 The proposed treatment discussed in conference is for discussion purpose only and is not a binding recommendation.  The patients have not been physically examined, or presented with their treatment options.  Therefore, final treatment plans cannot be decided.

## 2024-10-09 NOTE — Telephone Encounter (Signed)
 Completed [employer]  PAP renewal application and faxed with insurance card, med list and 3 grant closure screenshots to [employer]. Do not currently have access to pa approval letter. Will update when we receive a response.  Phone #: [phone] Fax #: [phone]

## 2024-10-11 LAB — FUNGUS CULTURE WITH STAIN

## 2024-10-11 LAB — FUNGAL ORGANISM REFLEX

## 2024-10-11 LAB — FUNGUS CULTURE RESULT

## 2024-10-12 ENCOUNTER — Encounter: Payer: Self-pay | Admitting: Thoracic Surgery (Cardiothoracic Vascular Surgery)

## 2024-10-12 ENCOUNTER — Ambulatory Visit
Attending: Thoracic Surgery (Cardiothoracic Vascular Surgery) | Admitting: Thoracic Surgery (Cardiothoracic Vascular Surgery)

## 2024-10-12 VITALS — BP 119/67 | HR 61 | Resp 18 | Ht 69.0 in | Wt 192.0 lb

## 2024-10-12 DIAGNOSIS — R911 Solitary pulmonary nodule: Secondary | ICD-10-CM | POA: Diagnosis not present

## 2024-10-12 NOTE — Progress Notes (Signed)
 PCP is Kip Righter, MD Referring Provider is Ruthell Lauraine FALCON, NP  Chief Complaint  Patient presents with   Lung Lesion    HPI: Nicholas Camacho is sent for consultation regarding a squamous cell carcinoma of the right lower lobe.  Nicholas Camacho is a 74 year old man with a past history of tobacco use, COPD, CAD, atrial fibrillation, stroke, hypertension, hyperlipidemia, hypothyroidism, sleep apnea, abdominal aortic aneurysm, 4 cm ascending aortic aneurysm and anemia.  He smoked 1-1/2 packs of cigarettes daily for 45 years prior to quitting in 2017.  Has been followed in the low-dose CT screening program.  He previously been worked up for a nodule.  He had a PET/CT in February 2024 which showed some metabolic activity in the right infrahilar region but no associated mass.  CT in August 2025 showed a right lower lobe mass.  He had a 35-month follow-up in November which showed a question of a right lower lobe mass (up to 7 cm in greatest dimension), possibly crossing the fissure into the middle lobe.  On PET/CT there was a much more discrete mass which was markedly hypermetabolic with an SUV of 13.5.  The mass appeared to have extensive surrounding atelectasis and pneumonitis.  He underwent bronchoscopy by Dr. Kara.  Biopsy showed squamous cell carcinoma.  MRI of the brain was negative for metastatic disease.  He has lost about 20 pounds over the past 3 months.  Appetite and food intake and been normal.  He does get short of breath with exertion.  He can walk up a flight of stairs but is winded when he gets to the top.  He does have a history of coronary disease, but no MI.  Not having any chest pain, pressure, or tightness.  Also has a history of atrial fibrillation.  He is on Eliquis .  Had a mini stroke with slurred speech 4 years ago but had complete recovery and no issues since.  He saw Dr. Maritza from radiation oncology.  He is a candidate for radiation therapy, likely hypofractionated.  Zubrod  Score: At the time of surgery this patients most appropriate activity status/level should be described as: []     0    Normal activity, no symptoms [x]     1    Restricted in physical strenuous activity but ambulatory, able to do out light work []     2    Ambulatory and capable of self care, unable to do work activities, up and about >50 % of waking hours                              []     3    Only limited self care, in bed greater than 50% of waking hours []     4    Completely disabled, no self care, confined to bed or chair []     5    Moribund  Patient Active Problem List   Diagnosis Date Noted   Primary malignant neoplasm of right lower lobe of lung (HCC) 09/01/2024   Acute CVA (cerebrovascular accident) (HCC) 06/03/2020   CVA (cerebral vascular accident) (HCC) 06/02/2020   Class 1 obesity due to excess calories with body mass index (BMI) of 32.0 to 32.9 in adult 06/02/2020   Allergic rhinitis 07/22/2018   CAD (coronary artery disease) 08/23/2017   Pneumonia 08/23/2017   Chest pain 07/25/2017   Coronary artery calcification seen on CT scan 07/25/2017   Leukocytosis 02/26/2017  Chronic respiratory failure with hypoxia (HCC) 02/11/2017   Abdominal aortic aneurysm without rupture 12/25/2016   Dizziness 11/28/2016   Symptomatic anemia 01/06/2016   Microcytic hypochromic anemia 01/06/2016   Cough 12/09/2015   Leg swelling 06/20/2015   Former smoker 12/03/2013   Atrial fibrillation (HCC) 09/29/2013   TIA (transient ischemic attack) 09/17/2013   Essential hypertension 09/17/2013   Hyperlipidemia 09/17/2013   COPD GOLD II B 09/15/2013   Disturbance of skin sensation 06/24/2013   Disturbance in sleep behavior 06/24/2013      Past Surgical History:  Procedure Laterality Date   BRONCHIAL BIOPSY  09/11/2024   Procedure: BRONCHOSCOPY, WITH BIOPSY;  Surgeon: Kara Dorn NOVAK, MD;  Location: [facility] ENDOSCOPY;  Service: Pulmonary;;   BRONCHIAL BRUSHINGS  09/11/2024   Procedure:  BRONCHOSCOPY, WITH BRUSH BIOPSY;  Surgeon: Kara Dorn NOVAK, MD;  Location: [facility] ENDOSCOPY;  Service: Pulmonary;;   BRONCHIAL NEEDLE ASPIRATION BIOPSY  09/11/2024   Procedure: BRONCHOSCOPY, WITH NEEDLE ASPIRATION BIOPSY;  Surgeon: Kara Dorn NOVAK, MD;  Location: [facility] ENDOSCOPY;  Service: Pulmonary;;   ESOPHAGOGASTRODUODENOSCOPY N/A 01/08/2016   Procedure: ESOPHAGOGASTRODUODENOSCOPY (EGD);  Surgeon: Lynwood Bohr, MD;  Location: [facility] ENDOSCOPY;  Service: Endoscopy;  Laterality: N/A;   fractured pelvis     INGUINAL HERNIA REPAIR Bilateral    LEFT HEART CATH AND CORONARY ANGIOGRAPHY N/A 08/30/2017   Procedure: LEFT HEART CATH AND CORONARY ANGIOGRAPHY;  Surgeon: Verlin Lonni BIRCH, MD;  Location: [facility] INVASIVE CV LAB;  Service: Cardiovascular;  Laterality: N/A;   reconstruct left heel  2002   TONSILLECTOMY     VIDEO BRONCHOSCOPY WITH ENDOBRONCHIAL NAVIGATION Right 09/11/2024   Procedure: VIDEO BRONCHOSCOPY WITH ENDOBRONCHIAL NAVIGATION;  Surgeon: Kara Dorn NOVAK, MD;  Location: [facility] ENDOSCOPY;  Service: Pulmonary;  Laterality: Right;    Family History  Problem Relation Age of Onset   Diabetes Mother    Prostate cancer Father    Kidney failure Father    Alcoholism Sister        history of   Drug abuse Sister        history of   Stroke Neg Hx     Social History Social History[1]  Current Outpatient Medications  Medication Sig Dispense Refill   atorvastatin  (LIPITOR ) 80 MG tablet Take 1 tablet (80 mg total) by mouth daily. 90 tablet 3   azithromycin  (ZITHROMAX ) 250 MG tablet TAKE 1 TABLET BY MOUTH DAILY 30 tablet 5   benralizumab  (FASENRA  PEN) 30 MG/ML prefilled autoinjector Inject 1 mL (30 mg total) into the skin every 8 (eight) weeks. 1 mL 2   Cyanocobalamin  1000 MCG CAPS Take 1,000 mcg by mouth 2 (two) times a week.     diltiazem  (CARDIZEM  CD) 180 MG 24 hr capsule TAKE 1 CAPSULE(180 MG) BY MOUTH DAILY 90 capsule 2   ELIQUIS  5 MG TABS tablet TAKE 1 TABLET BY MOUTH TWICE DAILY 60  tablet 5   ferrous sulfate 325 (65 FE) MG EC tablet Take 325 mg by mouth daily.     fluticasone  (FLONASE ) 50 MCG/ACT nasal spray SHAKE LIQUID AND USE 2 SPRAYS IN EACH NOSTRIL TWICE DAILY 16 g 3   ipratropium (ATROVENT ) 0.03 % nasal spray Place 2 sprays into both nostrils every 12 (twelve) hours. 30 mL 12   ipratropium-albuterol  (DUONEB) 0.5-2.5 (3) MG/3ML SOLN USE 1 VIAL VIA NEBULIZER THREE TIMES DAILY 360 mL 6   levothyroxine (SYNTHROID) 50 MCG tablet Take 50 mcg by mouth every morning.     losartan  (COZAAR ) 50 MG tablet TAKE 1 TABLET(50 MG)  BY MOUTH DAILY 90 tablet 2   montelukast  (SINGULAIR ) 10 MG tablet Take 1 tablet (10 mg total) by mouth at bedtime. 30 tablet 11   nitroGLYCERIN  (NITROSTAT ) 0.4 MG SL tablet Place 1 tablet under the tongue every 5 (five) minutes as needed for chest pain.     Respiratory Therapy Supplies (FLUTTER) DEVI 1 Device by Does not apply route in the morning and at bedtime. 1 each 0   TRELEGY ELLIPTA  200-62.5-25 MCG/ACT AEPB INHALE 1 PUFF INTO THE LUNGS DAILY 60 each 6   VENTOLIN  HFA 108 (90 Base) MCG/ACT inhaler INHALE 2 PUFFS BY MOUTH EVERY 6 HOURS AS NEEDED FOR WHEEZING OR SHORTNESS OF BREATH 18 g 1   No current facility-administered medications for this visit.    Allergies[2]  Review of Systems  Constitutional:  Positive for unexpected weight change. Negative for activity change and appetite change.  HENT:  Negative for trouble swallowing and voice change.   Eyes:  Positive for visual disturbance.  Respiratory:  Positive for cough, shortness of breath and wheezing.   Cardiovascular:  Negative for chest pain, palpitations and leg swelling.  Gastrointestinal:  Negative for abdominal distention and abdominal pain.  Genitourinary:  Negative for difficulty urinating and dysuria.  Musculoskeletal:  Negative for arthralgias and gait problem.  Neurological:  Positive for dizziness (None recently) and speech difficulty (With TIA, resolved). Negative for seizures.   Hematological:  Negative for adenopathy. Bruises/bleeds easily (Eliquis ).    BP 119/67 (BP Location: Left Arm)   Pulse 61   Resp 18   Ht 5' 9 (1.753 m)   Wt 192 lb (87.1 kg)   SpO2 92%   BMI 28.35 kg/m  Physical Exam Vitals reviewed.  Constitutional:      General: He is not in acute distress.    Comments: Temporal and thenar wasting  HENT:     Head: Normocephalic and atraumatic.  Eyes:     General: No scleral icterus.    Extraocular Movements: Extraocular movements intact.  Cardiovascular:     Rate and Rhythm: Normal rate and regular rhythm.     Heart sounds: Normal heart sounds. No murmur heard. Pulmonary:     Effort: Pulmonary effort is normal. No respiratory distress.     Breath sounds: Normal breath sounds. No wheezing or rales.  Abdominal:     General: There is no distension.     Palpations: Abdomen is soft.  Musculoskeletal:     Cervical back: Neck supple.  Lymphadenopathy:     Cervical: No cervical adenopathy.  Skin:    General: Skin is warm and dry.  Neurological:     General: No focal deficit present.     Mental Status: He is alert and oriented to person, place, and time.     Cranial Nerves: No cranial nerve deficit.     Motor: No weakness.    Diagnostic Tests: NUCLEAR MEDICINE PET SKULL BASE TO THIGH   TECHNIQUE: 9.59 mCi F-18 FDG was injected intravenously. Full-ring PET imaging was performed from the skull base to thigh after the radiotracer. CT data was obtained and used for attenuation correction and anatomic localization.   Fasting blood glucose: 108 mg/dl   COMPARISON:  Multiple prior chest CTs.  Prior PET-CT 11/12/2022   FINDINGS: Mediastinal blood pool activity: SUV max 2.5   Liver activity: SUV max NA   NECK: No hypermetabolic lymph nodes in the neck.   Incidental CT findings: Bilateral carotid artery calcifications.   CHEST: Right infrahilar/right lower lobe lung mass  is hypermetabolic with SUV max of 13.5 and consistent with  neoplasm. There is significant surrounding airspace process which is likely postobstructive pneumonia. Interstitial spread of tumor is also possible. Obstructed bronchi extending posteriorly could be filled with fluid or tumor. This area has an SUV max of 6.4 and appears to extend right to the pleural surface.   No enlarged or hypermetabolic mediastinal nodes. No supraclavicular or axillary adenopathy. No pulmonary nodules to suggest pulmonary metastatic disease.   Incidental CT findings: Stable aortic and three-vessel coronary artery calcifications.   ABDOMEN/PELVIS: No abnormal hypermetabolic activity within the liver, pancreas, adrenal glands, or spleen. No hypermetabolic lymph nodes in the abdomen or pelvis.   Incidental CT findings: Stable cholelithiasis. Extensive calcifications in the pancreatic body and tail regions likely related to prior pancreatitis. Stable advanced atherosclerotic calcifications involving the aorta and iliac arteries. Slight interval enlargement of the abdominal aortic aneurysm. This measures 4.1 x 4.7 cm and previously measured 3.9 x 4.5 cm. Stable severe sigmoid colon diverticulosis.   SKELETON: No findings suspicious for osseous metastatic disease.   Incidental CT findings: Stable surgical changes at the pubic symphysis with metallic artifact. Evidence of prior healed left sacral fracture.   IMPRESSION: 1. 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 There is significant surrounding airspace process which is likely postobstructive pneumonia. Interstitial spread of tumor is also possible. 2. No enlarged or hypermetabolic mediastinal or hilar lymph nodes. 3. No findings for pulmonary metastatic disease or abdominal/pelvic metastatic disease. 4. Slight interval enlargement of the abdominal aortic aneurysm. Recommend follow-up CT/MR every 6 months and vascular consultation. 5. Stable  cholelithiasis. 6. Stable advanced atherosclerotic calcifications involving the aorta and iliac arteries. 7. Aortic atherosclerosis.   Aortic Atherosclerosis (ICD10-I70.0).     Electronically Signed   By: MYRTIS Stammer M.D.   On: 09/10/2024 19:17  I personally reviewed the CT and PET/CT images.  Right lower lobe mass with postobstructive atelectasis/pneumonitis.  No mediastinal or hilar adenopathy.  No distant metastases.  Three-vessel coronary artery disease, aortic atherosclerosis, ascending and abdominal aortic aneurysms.  Pulmonary function testing 09/21/2024 FVC 3.20 (79%) FEV1 1.76 (60%) No change postbronchodilator TLC 9.38 (141%) RV 6.14 (257%) DLCO 16.82 (69%)  Impression: Nicholas Camacho is a 74 year old man with a past history of tobacco use, COPD, CAD, atrial fibrillation, stroke, hypertension, hyperlipidemia, hypothyroidism, sleep apnea, abdominal aortic aneurysm, 4 cm ascending aortic aneurysm and anemia.  He smoked 1-1/2 packs of cigarettes daily for 45 years prior to quitting in 2017.  Recently diagnosed with squamous cell carcinoma.  He has an endobronchial lesion with obstructive pneumonitis.  Squamous cell carcinoma right lower lobe-postobstructive atelectasis T2a,N0, stage IB.  Treatment options include surgery or radiation possibly with addition of chemotherapy and/or immunotherapy.  We discussed the potential options.  One would be surgical resection.  That would require a bilobectomy.  He does have adequate pulmonary reserve to tolerate the procedure.  It is possible that with neoadjuvant chemoimmunotherapy we might be able to shrink the tumor and give him a better chance of being treated with just a lower lobectomy rather than a bilobectomy.  Another option would be radiation therapy with or without radiation sensitizing chemotherapy.  We discussed the relative advantages and disadvantages of each of these approaches.  He and his family understand that any of these  approaches could potentially be curative but that there is no guarantee with any approach.  In general surgical resection offers the best chance of a cure, at the  expense of upfront morbidity and mortality.  I did inform them of the proposed surgical procedure.  The plan would be to do a robotic assisted right lower lobectomy or more likely bilobectomy.  They understand the general nature of the procedure including the need for general anesthesia, the incision to be used, the use of drains to postoperatively, the expected hospital stay, and the overall recovery.  I informed them of the indications, risks, benefits, and alternatives.  They understand the risks include, but are not limited to death, MI, DVT, PE, bleeding, possible need for transfusion, infection, prolonged air leak, cardiac arrhythmias, as well as the possibility of other unforeseeable complications.  He has met with radiation oncology.  After our discussion in our multidisciplinary group the potential for neoadjuvant chemoimmunotherapy was discussed.  Would like him to see Dr. Sherrod before making a decision on how he would like to proceed.  Will arrange for that consultation.  COPD-has significant since COPD.  Gold class II.  Not a candidate for pneumonectomy but is a candidate for lobectomy, or even bilobectomy.  Suspect he would have significant dyspnea with a bilobectomy.  CAD-has known coronary disease.  Catheterization in 2018 showed multiple moderate stenoses nonhemodynamically significant.  Not having any anginal symptoms at present.  Abdominal aortic aneurysm-no indication for surgery at present but will need vascular surgery consultation in near future.  Tobacco use-quit smoking in 2017  Plan: Consultation with Dr. Sherrod Patient will let us  know how he would like to proceed.  Elspeth JAYSON Millers, MD [facility] [phone]     [1]  Social History Tobacco Use   Smoking status: Former     Current packs/day: 0.00    Average packs/day: 1.5 packs/day for 45.0 years (67.5 ttl pk-yrs)    Types: Cigarettes    Start date: 11/18/1970    Quit date: 11/19/2015    Years since quitting: 8.9   Smokeless tobacco: Never   Tobacco comments:    pt quit smoking!! 11/19/15  Vaping Use   Vaping status: Never Used  Substance Use Topics   Alcohol use: No    Alcohol/week: 0.0 standard drinks of alcohol   Drug use: No  [2]  Allergies Allergen Reactions   Morphine Other (See Comments)   Morphine And Codeine     HALLUCINATIONS

## 2024-10-14 ENCOUNTER — Ambulatory Visit: Admitting: Radiation Oncology

## 2024-10-19 ENCOUNTER — Other Ambulatory Visit: Payer: Self-pay | Admitting: Pulmonary Disease

## 2024-10-20 ENCOUNTER — Other Ambulatory Visit: Payer: Self-pay

## 2024-10-20 DIAGNOSIS — C3431 Malignant neoplasm of lower lobe, right bronchus or lung: Secondary | ICD-10-CM

## 2024-10-21 ENCOUNTER — Inpatient Hospital Stay: Attending: Internal Medicine

## 2024-10-21 ENCOUNTER — Inpatient Hospital Stay: Admitting: Internal Medicine

## 2024-10-21 VITALS — BP 110/70 | HR 54 | Temp 97.6°F | Ht 69.0 in | Wt 189.0 lb

## 2024-10-21 DIAGNOSIS — J9819 Other pulmonary collapse: Secondary | ICD-10-CM | POA: Insufficient documentation

## 2024-10-21 DIAGNOSIS — Z79899 Other long term (current) drug therapy: Secondary | ICD-10-CM | POA: Insufficient documentation

## 2024-10-21 DIAGNOSIS — E7800 Pure hypercholesterolemia, unspecified: Secondary | ICD-10-CM

## 2024-10-21 DIAGNOSIS — Z8673 Personal history of transient ischemic attack (TIA), and cerebral infarction without residual deficits: Secondary | ICD-10-CM | POA: Insufficient documentation

## 2024-10-21 DIAGNOSIS — Z811 Family history of alcohol abuse and dependence: Secondary | ICD-10-CM

## 2024-10-21 DIAGNOSIS — Z7901 Long term (current) use of anticoagulants: Secondary | ICD-10-CM

## 2024-10-21 DIAGNOSIS — E039 Hypothyroidism, unspecified: Secondary | ICD-10-CM | POA: Diagnosis not present

## 2024-10-21 DIAGNOSIS — Z9089 Acquired absence of other organs: Secondary | ICD-10-CM | POA: Insufficient documentation

## 2024-10-21 DIAGNOSIS — I4891 Unspecified atrial fibrillation: Secondary | ICD-10-CM | POA: Insufficient documentation

## 2024-10-21 DIAGNOSIS — C3411 Malignant neoplasm of upper lobe, right bronchus or lung: Secondary | ICD-10-CM | POA: Diagnosis not present

## 2024-10-21 DIAGNOSIS — Z8042 Family history of malignant neoplasm of prostate: Secondary | ICD-10-CM | POA: Diagnosis not present

## 2024-10-21 DIAGNOSIS — Z813 Family history of other psychoactive substance abuse and dependence: Secondary | ICD-10-CM | POA: Diagnosis not present

## 2024-10-21 DIAGNOSIS — J449 Chronic obstructive pulmonary disease, unspecified: Secondary | ICD-10-CM

## 2024-10-21 DIAGNOSIS — C3431 Malignant neoplasm of lower lobe, right bronchus or lung: Secondary | ICD-10-CM

## 2024-10-21 DIAGNOSIS — Z885 Allergy status to narcotic agent status: Secondary | ICD-10-CM

## 2024-10-21 DIAGNOSIS — Z8419 Family history of other disorders of kidney and ureter: Secondary | ICD-10-CM | POA: Diagnosis not present

## 2024-10-21 DIAGNOSIS — R5383 Other fatigue: Secondary | ICD-10-CM | POA: Diagnosis not present

## 2024-10-21 DIAGNOSIS — Z87891 Personal history of nicotine dependence: Secondary | ICD-10-CM

## 2024-10-21 DIAGNOSIS — Z833 Family history of diabetes mellitus: Secondary | ICD-10-CM | POA: Insufficient documentation

## 2024-10-21 DIAGNOSIS — I10 Essential (primary) hypertension: Secondary | ICD-10-CM

## 2024-10-21 LAB — CBC WITH DIFFERENTIAL (CANCER CENTER ONLY)
Abs Immature Granulocytes: 0.03 K/uL (ref 0.00–0.07)
Basophils Absolute: 0 K/uL (ref 0.0–0.1)
Basophils Relative: 0 %
Eosinophils Absolute: 0 K/uL (ref 0.0–0.5)
Eosinophils Relative: 0 %
HCT: 43.6 % (ref 39.0–52.0)
Hemoglobin: 13.6 g/dL (ref 13.0–17.0)
Immature Granulocytes: 0 %
Lymphocytes Relative: 15 %
Lymphs Abs: 1.1 K/uL (ref 0.7–4.0)
MCH: 26.7 pg (ref 26.0–34.0)
MCHC: 31.2 g/dL (ref 30.0–36.0)
MCV: 85.5 fL (ref 80.0–100.0)
Monocytes Absolute: 0.8 K/uL (ref 0.1–1.0)
Monocytes Relative: 11 %
Neutro Abs: 5.4 K/uL (ref 1.7–7.7)
Neutrophils Relative %: 74 %
Platelet Count: 260 K/uL (ref 150–400)
RBC: 5.1 MIL/uL (ref 4.22–5.81)
RDW: 16.3 % — ABNORMAL HIGH (ref 11.5–15.5)
WBC Count: 7.3 K/uL (ref 4.0–10.5)
nRBC: 0 % (ref 0.0–0.2)

## 2024-10-21 LAB — CMP (CANCER CENTER ONLY)
ALT: 24 U/L (ref 0–44)
AST: 25 U/L (ref 15–41)
Albumin: 4.1 g/dL (ref 3.5–5.0)
Alkaline Phosphatase: 147 U/L — ABNORMAL HIGH (ref 38–126)
Anion gap: 10 (ref 5–15)
BUN: 18 mg/dL (ref 8–23)
CO2: 26 mmol/L (ref 22–32)
Calcium: 9.4 mg/dL (ref 8.9–10.3)
Chloride: 104 mmol/L (ref 98–111)
Creatinine: 1.06 mg/dL (ref 0.61–1.24)
GFR, Estimated: >60 mL/min
Glucose, Bld: 112 mg/dL — ABNORMAL HIGH (ref 70–99)
Potassium: 4.8 mmol/L (ref 3.5–5.1)
Sodium: 140 mmol/L (ref 135–145)
Total Bilirubin: 0.4 mg/dL (ref 0.0–1.2)
Total Protein: 7 g/dL (ref 6.5–8.1)

## 2024-10-21 MED ORDER — PROCHLORPERAZINE MALEATE 10 MG PO TABS: 10.0000 mg | ORAL_TABLET | Freq: Four times a day (QID) | ORAL | 1 refills | Status: AC | PRN

## 2024-10-21 MED ORDER — ONDANSETRON HCL 8 MG PO TABS: 8.0000 mg | ORAL_TABLET | Freq: Three times a day (TID) | ORAL | 1 refills | Status: AC | PRN

## 2024-10-21 NOTE — Progress Notes (Signed)
 Non-Small Cell Lung - No Medical Intervention - Off Treatment.  Patient Characteristics: Preoperative or Nonsurgical Candidate (Clinical Staging), Stage IIA or Stage IIB (N0-1 only), Surgical Candidate, EGFR Positive/Unknown or ALK Positive/Unknown, OR Neoadjuvant/Perioperative Chemoimmunotherapy Not Preferred Therapeutic Status: Preoperative or Nonsurgical Candidate (Clinical Staging) Check here if patient was staged using an edition other than AJCC Staging 9th Edition: false AJCC T Category: cT3 AJCC N Category: cN0 AJCC M Category: cM0 AJCC 9 Stage Grouping: IIB EGFR Mutation Status: Did Not Order Test ALK Fusion/Rearrangement Status: Did Not Order Test Preferred Treatment: Neoadjuvant-only Chemoimmunotherapy Preferred

## 2024-10-21 NOTE — Progress Notes (Signed)
 DISCONTINUE ON PATHWAY REGIMEN - Non-Small Cell Lung  No Medical Intervention - Off Treatment.  PRIOR TREATMENT: [id number]: Referral to Surgery  Non-Small Cell Lung - No Medical Intervention - Off Treatment.  Patient Characteristics: Preoperative or Nonsurgical Candidate (Clinical Staging), Stage IIA or Stage IIB (N0-1 only), Surgical Candidate, EGFR Positive/Unknown or ALK Positive/Unknown, OR Neoadjuvant/Perioperative Chemoimmunotherapy Not Preferred Therapeutic Status: Preoperative or Nonsurgical Candidate (Clinical Staging) AJCC T Category: cT3 AJCC N Category: cN0 AJCC M Category: cM0 AJCC 9 Stage Grouping: IIB Check here if patient was staged using an edition other than AJCC Staging 9th Edition: true ALK Fusion/Rearrangement Status: Did Not Order Test Preferred Treatment: Neoadjuvant-only Chemoimmunotherapy Preferred EGFR Mutation Status: Did Not Order Test

## 2024-10-21 NOTE — Progress Notes (Signed)
 "   [facility] [phone]) [phone]   Fax:([phone]  CONSULT NOTE  REFERRING PHYSICIAN: Dr. Vicci Chill  REASON FOR CONSULTATION:  74 years old white male recently diagnosed with lung cancer  HPI Nicholas Camacho is a 74 y.o. male.   HPI  Discussed the use of AI scribe software for clinical note transcription with the patient, who gave verbal consent to proceed.  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  He has undergone annual CT lung cancer screening for five years, with serial imaging demonstrating a persistent right lung nodule. In August 2025, CT revealed a 2.2 cm right lower lobe nodule, which by November 2025 had increased to a 6.7 cm right upper lobe mass crossing the right major fissure. PET scan in December 2025 showed a hypermetabolic mass with post-obstructive inflammation and associated lung collapse. MRI brain was negative for metastatic disease. Bronchoscopy with biopsy confirmed squamous cell carcinoma. There is no evidence of lymph node involvement or extrathoracic spread. He previously received antibiotics for a lung nodule presumed infectious, but the current mass has progressed despite prior interventions. Pulmonary function testing was performed and results were reported as improved compared to prior tests.  He currently feels physically well, with stable respiratory status and decreased cough. He denies chest pain, hemoptysis, nausea, vomiting, diarrhea, headaches, weight loss, fevers, chills, fatigue, shortness of breath, palpitations, abnormal bleeding, bruising, or new neurological deficits. Oxygen  saturation was recently measured at 96%.  Relevant medical history includes chronic obstructive pulmonary disease, atrial fibrillation, hypertension,  hypothyroidism, prior ischemic stroke in 2020-2021, and severe anemia previously attributed to combined use of Xarelto  and aspirin  and iron  deficiency, requiring transfusion. He is currently taking Fasenra  and has a known allergy to morphine, which causes hallucinations.     Past Medical History:  Diagnosis Date   Anemia    Hx of anemia x 4 years ago   COPD (chronic obstructive pulmonary disease) (HCC)    Disturbance of skin sensation 06/24/2013   Dysrhythmia    Elevated cholesterol    History of echocardiogram    Echo 4/17: EF 60-65%, no RWMA, Gr 2 DD   Hypertension    Hypothyroidism    Sleep disturbance, unspecified 06/24/2013   Stroke [facility])       Past Surgical History:  Procedure Laterality Date   BRONCHIAL BIOPSY  09/11/2024   Procedure: BRONCHOSCOPY, WITH BIOPSY;  Surgeon: Chill Dorn NOVAK, MD;  Location: [facility] ENDOSCOPY;  Service: Pulmonary;;   BRONCHIAL BRUSHINGS  09/11/2024   Procedure: BRONCHOSCOPY, WITH BRUSH BIOPSY;  Surgeon: Chill Dorn NOVAK, MD;  Location: [facility] ENDOSCOPY;  Service: Pulmonary;;   BRONCHIAL NEEDLE ASPIRATION BIOPSY  09/11/2024   Procedure: BRONCHOSCOPY, WITH NEEDLE ASPIRATION BIOPSY;  Surgeon: Chill Dorn NOVAK, MD;  Location: [facility] ENDOSCOPY;  Service: Pulmonary;;   ESOPHAGOGASTRODUODENOSCOPY N/A 01/08/2016   Procedure: ESOPHAGOGASTRODUODENOSCOPY (EGD);  Surgeon: Lynwood Bohr, MD;  Location: [facility] ENDOSCOPY;  Service: Endoscopy;  Laterality: N/A;   fractured pelvis     INGUINAL HERNIA REPAIR Bilateral    LEFT HEART CATH AND CORONARY ANGIOGRAPHY N/A 08/30/2017   Procedure: LEFT HEART CATH AND CORONARY ANGIOGRAPHY;  Surgeon: Verlin Lonni BIRCH, MD;  Location: [facility] INVASIVE CV LAB;  Service: Cardiovascular;  Laterality: N/A;   reconstruct left heel  2002   TONSILLECTOMY     VIDEO BRONCHOSCOPY WITH ENDOBRONCHIAL NAVIGATION Right  09/11/2024   Procedure: VIDEO BRONCHOSCOPY WITH ENDOBRONCHIAL NAVIGATION;  Surgeon: Kara Dorn NOVAK, MD;  Location: [facility]  ENDOSCOPY;  Service: Pulmonary;  Laterality: Right;    Family History  Problem Relation Age of Onset   Diabetes Mother    Prostate cancer Father    Kidney failure Father    Alcoholism Sister        history of   Drug abuse Sister        history of   Stroke Neg Hx     Social History Social History[1]  Allergies[2]  Current Outpatient Medications  Medication Sig Dispense Refill   atorvastatin  (LIPITOR ) 80 MG tablet Take 1 tablet (80 mg total) by mouth daily. 90 tablet 3   azithromycin  (ZITHROMAX ) 250 MG tablet TAKE 1 TABLET BY MOUTH DAILY 30 tablet 5   benralizumab  (FASENRA  PEN) 30 MG/ML prefilled autoinjector Inject 1 mL (30 mg total) into the skin every 8 (eight) weeks. 1 mL 2   Cyanocobalamin  1000 MCG CAPS Take 1,000 mcg by mouth 2 (two) times a week.     diltiazem  (CARDIZEM  CD) 180 MG 24 hr capsule TAKE 1 CAPSULE(180 MG) BY MOUTH DAILY 90 capsule 2   ELIQUIS  5 MG TABS tablet TAKE 1 TABLET BY MOUTH TWICE DAILY 60 tablet 5   ferrous sulfate 325 (65 FE) MG EC tablet Take 325 mg by mouth daily.     fluticasone  (FLONASE ) 50 MCG/ACT nasal spray SHAKE LIQUID AND USE 2 SPRAYS IN EACH NOSTRIL TWICE DAILY 16 g 3   ipratropium (ATROVENT ) 0.03 % nasal spray Place 2 sprays into both nostrils every 12 (twelve) hours. 30 mL 12   ipratropium-albuterol  (DUONEB) 0.5-2.5 (3) MG/3ML SOLN USE 1 VIAL VIA NEBULIZER THREE TIMES DAILY 360 mL 6   levothyroxine (SYNTHROID) 50 MCG tablet Take 50 mcg by mouth every morning.     losartan  (COZAAR ) 50 MG tablet TAKE 1 TABLET(50 MG) BY MOUTH DAILY 90 tablet 2   montelukast  (SINGULAIR ) 10 MG tablet Take 1 tablet (10 mg total) by mouth at bedtime. 30 tablet 11   nitroGLYCERIN  (NITROSTAT ) 0.4 MG SL tablet Place 1 tablet under the tongue every 5 (five) minutes as needed for chest pain.     Respiratory Therapy Supplies (FLUTTER) DEVI 1 Device by Does not apply route in the morning and at bedtime. 1 each 0   TRELEGY ELLIPTA  200-62.5-25 MCG/ACT AEPB INHALE 1 PUFF INTO  THE LUNGS DAILY 60 each 6   VENTOLIN  HFA 108 (90 Base) MCG/ACT inhaler INHALE 2 PUFFS BY MOUTH EVERY 6 HOURS AS NEEDED FOR WHEEZING OR SHORTNESS OF BREATH 18 g 1   No current facility-administered medications for this visit.    Review of Systems  Constitutional: positive for fatigue Eyes: negative Ears, nose, mouth, throat, and face: negative Respiratory: positive for cough Cardiovascular: negative Gastrointestinal: negative Genitourinary:negative Integument/breast: negative Hematologic/lymphatic: negative Musculoskeletal:negative Neurological: negative Behavioral/Psych: negative Endocrine: negative Allergic/Immunologic: negative  Physical Exam  MJO:jozmu, healthy, no distress, well nourished, and well developed SKIN: skin color, texture, turgor are normal, no rashes or significant lesions HEAD: Normocephalic, No masses, lesions, tenderness or abnormalities EYES: normal, PERRLA, Conjunctiva are pink and non-injected EARS: External ears normal, Canals clear OROPHARYNX:no exudate, no erythema, and lips, buccal mucosa, and tongue normal  NECK: supple, no adenopathy, no JVD LYMPH:  no palpable lymphadenopathy, no hepatosplenomegaly LUNGS: clear to auscultation , and palpation HEART: regular rate & rhythm, no murmurs, and no gallops ABDOMEN:abdomen soft, non-tender, normal bowel sounds, and no masses or organomegaly BACK: Back  symmetric, no curvature., No CVA tenderness EXTREMITIES:no joint deformities, effusion, or inflammation, no edema  NEURO: alert & oriented x 3 with fluent speech, no focal motor/sensory deficits  PERFORMANCE STATUS: ECOG 1  LABORATORY DATA: Lab Results  Component Value Date   WBC 7.3 10/21/2024   HGB 13.6 10/21/2024   HCT 43.6 10/21/2024   MCV 85.5 10/21/2024   PLT 260 10/21/2024      Chemistry      Component Value Date/Time   NA 140 10/21/2024 1337   NA 140 09/03/2024 1220   K 4.8 10/21/2024 1337   CL 104 10/21/2024 1337   CO2 26  10/21/2024 1337   BUN 18 10/21/2024 1337   BUN 16 09/03/2024 1220   CREATININE 1.06 10/21/2024 1337   CREATININE 1.20 03/30/2016 0951      Component Value Date/Time   CALCIUM  9.4 10/21/2024 1337   ALKPHOS 147 (H) 10/21/2024 1337   AST 25 10/21/2024 1337   ALT 24 10/21/2024 1337   BILITOT 0.4 10/21/2024 1337       RADIOGRAPHIC STUDIES: MR BRAIN W WO CONTRAST Result Date: 10/05/2024 EXAM: MRI BRAIN WITH AND WITHOUT CONTRAST 09/25/2024 01:19:06 PM TECHNIQUE: Multiplanar multisequence MRI of the head/brain was performed with and without the administration of intravenous contrast. CONTRAST: 8 mL of Gadavist . COMPARISON: MR Head 06/02/2020. CLINICAL HISTORY: Non-small cell lung cancer (NSCLC), staging. Malignant neoplasm of unspecified part of unspecified bronchus or lung. FINDINGS: BRAIN AND VENTRICLES: There is no evidence of an acute infarct, intracranial hemorrhage, mass, midline shift, hydrocephalus, or extra-axial fluid collection. Mild cerebral atrophy is within normal limits for age. T2 hyperintensities in the cerebral white matter bilaterally are similar to the prior MRI and are nonspecific but compatible with mild chronic small vessel ischemic disease. There is a tiny chronic cortical infarct in the right precentral gyrus which is unchanged. No abnormal enhancement is identified. Major intracranial vascular flow voids are preserved. ORBITS: No acute abnormality. SINUSES: Left maxillary sinus mucous retention cyst. Clear mastoid air cells. BONES AND SOFT TISSUES: Normal bone marrow signal and enhancement. No acute soft tissue abnormality. IMPRESSION: 1. No evidence of intracranial metastatic disease. 2. Mild chronic small vessel ischemic disease. Electronically signed by: Dasie Hamburg MD 10/05/2024 02:51 PM EST RP Workstation: [id number]    ASSESSMENT: This is a very pleasant 74 years old white male diagnosed with stage IIb (T3, N0, M0) non-small cell lung cancer, squamous cell carcinoma in  December 2025 and presented with right upper lobe lung mass.  The patient is a potentially surgically resectable based on his evaluation by Dr. Kerrin.   PLAN: I had a lengthy discussion with the patient and his wife today about his current disease status, prognosis and treatment options. I personally independently reviewed his imaging studies as well as the pathology report and discussed the result with the patient today. 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 PET scan revealed a hypermetabolic mass with post-obstructive inflammation and adjacent lung collapse, without evidence of lymph node, brain, or extrathoracic metastasis. Pulmonary function is sufficient for surgical consideration, though tumor proximity to the airway may require more extensive resection. The therapeutic goal is curative, with neoadjuvant chemotherapy and immunotherapy planned to facilitate resection. If surgery is not feasible post-neoadjuvant therapy, definitive radiation remains a curative alternative. Risks of chemotherapy and immunotherapy include temporary alopecia,  mild neuropathy, cytopenias, nausea, vomiting, and organ toxicity. The white blood cell growth factor injection may cause flu-like symptoms, manageable with acetaminophen  or loratadine . - Provided chemotherapy education prior to treatment initiation. - Initiate neoadjuvant chemotherapy and immunotherapy with carboplatin, paclitaxel, and nivolumab every three weeks for three cycles (nine weeks). - Monitor blood counts, renal, and hepatic function weekly during therapy. - Provide antiemetic prophylaxis for nausea and vomiting. - Administer white blood cell growth factor injection two days after each chemotherapy cycle; advise acetaminophen  or loratadine   for associated flu-like symptoms. - Postpone CT simulation for radiation therapy at this time and communicate plan change to radiation oncology. - Schedule follow-up imaging after three cycles to assess tumor response. - If tumor reduction allows, refer to thoracic surgery for resection; if not feasible, proceed with definitive radiation therapy. - Plan to initiate therapy next week. He was advised to call immediately if he has any other concerning symptoms in the interval.  The patient voices understanding of current disease status and treatment options and is in agreement with the current care plan.  All questions were answered. The patient knows to call the clinic with any problems, questions or concerns. We can certainly see the patient much sooner if necessary.  Thank you so much for allowing me to participate in the care of Nicholas Camacho. I will continue to follow up the patient with you and assist in his care.  The total time spent in the appointment was 90 minutes including review of chart and various tests results, discussions about plan of care and coordination of care plan .   Disclaimer: This note was dictated with voice recognition software. Similar sounding words can inadvertently be transcribed and may not be corrected upon review.   Nicholas Camacho October 21, 2024, 2:35 PM       [1]  Social History Tobacco Use   Smoking status: Former    Current packs/day: 0.00    Average packs/day: 1.5 packs/day for 45.0 years (67.5 ttl pk-yrs)    Types: Cigarettes    Start date: 11/18/1970    Quit date: 11/19/2015    Years since quitting: 8.9   Smokeless tobacco: Never   Tobacco comments:    pt quit smoking!! 11/19/15  Vaping Use   Vaping status: Never Used  Substance Use Topics   Alcohol use: No    Alcohol/week: 0.0 standard drinks of alcohol   Drug use: No  [2]  Allergies Allergen Reactions   Morphine Other (See Comments)   Morphine And Codeine      HALLUCINATIONS   "

## 2024-10-21 NOTE — Progress Notes (Signed)
 DISCONTINUE ON PATHWAY REGIMEN - Non-Small Cell Lung  No Medical Intervention - Off Treatment.  PRIOR TREATMENT: Off Treatment  START ON PATHWAY REGIMEN - Non-Small Cell Lung     A cycle is every 21 days:     Nivolumab      Paclitaxel      Carboplatin   **Always confirm dose/schedule in your pharmacy ordering system**  Patient Characteristics: Preoperative or Nonsurgical Candidate (Clinical Staging), Stage IIA or Stage IIB (N0-1 only), Surgical Candidate, EGFR Negative and ALK Negative, AND Neoadjuvant-only Chemoimmunotherapy Preferred, Squamous Cell Therapeutic Status: Preoperative or Nonsurgical Candidate (Clinical Staging) Check here if patient was staged using an edition other than AJCC Staging 9th Edition: false AJCC T Category: cT3 AJCC N Category: cN0 AJCC M Category: cM0 AJCC 9 Stage Grouping: IIB EGFR Mutation Status: Negative/Wild Type ALK Fusion/Rearrangement Status: Negative Preferred Treatment: Neoadjuvant-only Chemoimmunotherapy Preferred Histology: Squamous Cell Intent of Therapy: Curative Intent, Discussed with Patient

## 2024-10-22 ENCOUNTER — Other Ambulatory Visit: Payer: Self-pay | Admitting: Pharmacist

## 2024-10-22 ENCOUNTER — Encounter: Payer: Self-pay | Admitting: Pulmonary Disease

## 2024-10-22 ENCOUNTER — Ambulatory Visit: Admitting: Pulmonary Disease

## 2024-10-22 ENCOUNTER — Ambulatory Visit: Admitting: Radiation Oncology

## 2024-10-22 ENCOUNTER — Other Ambulatory Visit: Payer: Self-pay

## 2024-10-22 VITALS — BP 117/60 | HR 62 | Ht 69.0 in | Wt 190.0 lb

## 2024-10-22 DIAGNOSIS — J4489 Other specified chronic obstructive pulmonary disease: Secondary | ICD-10-CM | POA: Diagnosis not present

## 2024-10-22 DIAGNOSIS — Z87891 Personal history of nicotine dependence: Secondary | ICD-10-CM | POA: Diagnosis not present

## 2024-10-22 DIAGNOSIS — J111 Influenza due to unidentified influenza virus with other respiratory manifestations: Secondary | ICD-10-CM

## 2024-10-22 DIAGNOSIS — C3491 Malignant neoplasm of unspecified part of right bronchus or lung: Secondary | ICD-10-CM

## 2024-10-22 DIAGNOSIS — C3490 Malignant neoplasm of unspecified part of unspecified bronchus or lung: Secondary | ICD-10-CM | POA: Diagnosis not present

## 2024-10-22 MED ORDER — VENTOLIN HFA 108 (90 BASE) MCG/ACT IN AERS: INHALATION_SPRAY | RESPIRATORY_TRACT | 11 refills | Status: AC

## 2024-10-22 NOTE — Patient Instructions (Addendum)
 Continue nebulizer treatments followed by flutter valve to help clear out mucous from your airways  Continue trelegy ellipta  1 puff daily  Continue fasenra  injections  Continue azithromycin  daily  Good luck with the start of chemotherapy. Please let us  know if we can help with anything.  Follow up in 4 months

## 2024-10-22 NOTE — Progress Notes (Signed)
 "  Established Patient Pulmonology Office Visit   Subjective:  Patient ID: Nicholas Camacho, male    DOB: 09-20-1951  MRN: [id number]  CC:  Chief Complaint  Patient presents with   Medical Management of Chronic Issues    Pt states no new concerns     Discussed the use of AI scribe software for clinical note transcription with the patient, who gave verbal consent to proceed.  History of Present Illness Nicholas Camacho is a 74 year old male, former smoker with asthma-COPD overlap syndrome, nocturnal hypoxemia  and stage IIIB squamous cell carcinoma of right upper lobe who returns for follow-up.   He is undergoing treatment for a lung tumor with a plan to start infusions next Wednesday, followed by repeat imaging to assess response. He completed a course of Augmentin  for Haemophilus influenzae on bronchoscopy BAL cultures.  He has minimal mucus, occasionally green, with no hemoptysis. His cough is less frequent and less severe than previously. He notes increased fatigue. His weight increased from 185 to 189 pounds.  He is concerned about infection risk once chemotherapy begins and asks about mask use to prevent infections.  He had a recent issue with a Ventolin  refill denial at the pharmacy but still has two inhalers at home.           ROS   Current Medications[1]      Objective:  BP 117/60   Pulse 62   Ht 5' 9 (1.753 m) Comment: per pt  Wt 190 lb (86.2 kg)   SpO2 92%   BMI 28.06 kg/m   Wt Readings from Last 3 Encounters:  10/22/24 190 lb (86.2 kg)  10/21/24 189 lb (85.7 kg)  10/12/24 192 lb (87.1 kg)   BMI Readings from Last 3 Encounters:  10/22/24 28.06 kg/m  10/21/24 27.91 kg/m  10/12/24 28.35 kg/m    Physical Exam Constitutional:      General: He is not in acute distress.    Appearance: Normal appearance.  Eyes:     General: No scleral icterus.    Conjunctiva/sclera: Conjunctivae normal.  Cardiovascular:     Rate and Rhythm: Normal rate and  regular rhythm.  Pulmonary:     Breath sounds: No wheezing, rhonchi or rales.  Musculoskeletal:     Right lower leg: No edema.     Left lower leg: No edema.  Skin:    General: Skin is warm and dry.  Neurological:     General: No focal deficit present.      Diagnostic Review:  Last CBC Lab Results  Component Value Date   WBC 7.3 10/21/2024   HGB 13.6 10/21/2024   HCT 43.6 10/21/2024   MCV 85.5 10/21/2024   MCH 26.7 10/21/2024   RDW 16.3 (H) 10/21/2024   PLT 260 10/21/2024   Last metabolic panel Lab Results  Component Value Date   GLUCOSE 112 (H) 10/21/2024   NA 140 10/21/2024   K 4.8 10/21/2024   CL 104 10/21/2024   CO2 26 10/21/2024   BUN 18 10/21/2024   CREATININE 1.06 10/21/2024   GFRNONAA >60 10/21/2024   CALCIUM  9.4 10/21/2024   PROT 7.0 10/21/2024   ALBUMIN 4.1 10/21/2024   LABGLOB 2.8 11/23/2019   AGRATIO 1.4 11/23/2019   BILITOT 0.4 10/21/2024   ALKPHOS 147 (H) 10/21/2024   AST 25 10/21/2024   ALT 24 10/21/2024   ANIONGAP 10 10/21/2024       Assessment & Plan:   Assessment & Plan Asthma-COPD overlap syndrome (  HCC)     Squamous cell lung cancer, right (HCC)      Assessment and Plan Assessment & Plan Stage IIIB NSCLC  - Start chemotherapy next Wednesday. - Reimage after initial treatments to assess response. - Consider surgical evaluation if tumor size decreases.  Haemophilus influenzae respiratory infection Previously treated with Augmentin . Infection addressed with antibiotics. - Continue to monitor respiratory symptoms.  COPD-Asthma Overlap Syndrome - continue trelegy ellipta  1 puff daily - fasenra  injections - azithromycin  daily - montelukast  - PRN albuterol   Weight loss and nutritional risk due to malignancy Weight stabilized with slight increase. Nutritional intake supported with [employer] Life and collagen supplements. Anticipated decrease in appetite and energy with chemotherapy. - Continue nutritional supplements. -  Encouraged high-calorie and high-protein intake. - Consider protein shakes or whey protein for additional nutrition.      Return in about 4 months (around 02/19/2025) for f/u visit Dr. Kara.   Nicholas KATHEE Kara, MD     [1]  Current Outpatient Medications:    atorvastatin  (LIPITOR ) 80 MG tablet, Take 1 tablet (80 mg total) by mouth daily., Disp: 90 tablet, Rfl: 3   azithromycin  (ZITHROMAX ) 250 MG tablet, TAKE 1 TABLET BY MOUTH DAILY, Disp: 30 tablet, Rfl: 5   benralizumab  (FASENRA  PEN) 30 MG/ML prefilled autoinjector, Inject 1 mL (30 mg total) into the skin every 8 (eight) weeks., Disp: 1 mL, Rfl: 2   Cyanocobalamin  1000 MCG CAPS, Take 1,000 mcg by mouth 2 (two) times a week., Disp: , Rfl:    diltiazem  (CARDIZEM  CD) 180 MG 24 hr capsule, TAKE 1 CAPSULE(180 MG) BY MOUTH DAILY, Disp: 90 capsule, Rfl: 2   ELIQUIS  5 MG TABS tablet, TAKE 1 TABLET BY MOUTH TWICE DAILY, Disp: 60 tablet, Rfl: 5   ferrous sulfate 325 (65 FE) MG EC tablet, Take 325 mg by mouth daily., Disp: , Rfl:    fluticasone  (FLONASE ) 50 MCG/ACT nasal spray, SHAKE LIQUID AND USE 2 SPRAYS IN EACH NOSTRIL TWICE DAILY, Disp: 16 g, Rfl: 3   ipratropium (ATROVENT ) 0.03 % nasal spray, Place 2 sprays into both nostrils every 12 (twelve) hours., Disp: 30 mL, Rfl: 12   ipratropium-albuterol  (DUONEB) 0.5-2.5 (3) MG/3ML SOLN, USE 1 VIAL VIA NEBULIZER THREE TIMES DAILY, Disp: 360 mL, Rfl: 6   levothyroxine (SYNTHROID) 50 MCG tablet, Take 50 mcg by mouth every morning., Disp: , Rfl:    losartan  (COZAAR ) 50 MG tablet, TAKE 1 TABLET(50 MG) BY MOUTH DAILY, Disp: 90 tablet, Rfl: 2   montelukast  (SINGULAIR ) 10 MG tablet, Take 1 tablet (10 mg total) by mouth at bedtime., Disp: 30 tablet, Rfl: 11   nitroGLYCERIN  (NITROSTAT ) 0.4 MG SL tablet, Place 1 tablet under the tongue every 5 (five) minutes as needed for chest pain., Disp: , Rfl:    ondansetron  (ZOFRAN ) 8 MG tablet, Take 1 tablet (8 mg total) by mouth every 8 (eight) hours as needed for  nausea or vomiting. Start on the third day after chemotherapy., Disp: 30 tablet, Rfl: 1   prochlorperazine  (COMPAZINE ) 10 MG tablet, Take 1 tablet (10 mg total) by mouth every 6 (six) hours as needed for nausea or vomiting., Disp: 30 tablet, Rfl: 1   Respiratory Therapy Supplies (FLUTTER) DEVI, 1 Device by Does not apply route in the morning and at bedtime., Disp: 1 each, Rfl: 0   TRELEGY ELLIPTA  200-62.5-25 MCG/ACT AEPB, INHALE 1 PUFF INTO THE LUNGS DAILY, Disp: 60 each, Rfl: 6   VENTOLIN  HFA 108 (90 Base) MCG/ACT inhaler, INHALE 2 PUFFS BY MOUTH  EVERY 6 HOURS AS NEEDED FOR WHEEZING OR SHORTNESS OF BREATH, Disp: 18 g, Rfl: 11  "

## 2024-10-23 ENCOUNTER — Encounter: Payer: Self-pay | Admitting: Internal Medicine

## 2024-10-23 ENCOUNTER — Telehealth: Payer: Self-pay | Admitting: Internal Medicine

## 2024-10-23 ENCOUNTER — Inpatient Hospital Stay

## 2024-10-23 NOTE — Telephone Encounter (Signed)
 Called pt of the resched appt left a vm of the date and time

## 2024-10-23 NOTE — Progress Notes (Signed)
 This NN met with the pt on 1/21 at his consult appt with Dr. Sherrod. Pt was accompanied by his wife, Devere.  NN explained the role of navigation in the cancer care continuum.  The plan for the pt is 3 cycles of neoadjuvant chemoimmunotherapy f/b surgery.  NN provided pt with lung cancer journey book and information about services provided at the cancer center.  There are no barriers to the pt's care plan at this time.  NN provided pt with direct contact information and encouraged pt call with any questions or concerns.  NN escorted pt to scheduling to arrange for chemo education appt. Pt will be contacted by scheduler with date and time of first infusion appt.  Pt and Devere verbalized understanding of the treatment plan and denied questions or concerns at the end of the encounter.

## 2024-10-24 ENCOUNTER — Other Ambulatory Visit: Payer: Self-pay

## 2024-10-27 ENCOUNTER — Other Ambulatory Visit: Payer: Self-pay | Admitting: Pulmonary Disease

## 2024-10-27 DIAGNOSIS — J309 Allergic rhinitis, unspecified: Secondary | ICD-10-CM

## 2024-10-27 LAB — ACID FAST CULTURE WITH REFLEXED SENSITIVITIES (MYCOBACTERIA): Acid Fast Culture: NEGATIVE

## 2024-10-27 NOTE — Progress Notes (Signed)
 Pharmacist Chemotherapy Monitoring - Initial Assessment    Anticipated start date: 11/03/2024   The following has been reviewed per standard work regarding the patient's treatment regimen: The patient's diagnosis, treatment plan and drug doses, and organ/hematologic function Lab orders and baseline tests specific to treatment regimen  The treatment plan start date, drug sequencing, and pre-medications Prior authorization status  Patient's documented medication list, including drug-drug interaction screen and prescriptions for anti-emetics and supportive care specific to the treatment regimen The drug concentrations, fluid compatibility, administration routes, and timing of the medications to be used The patient's access for treatment and lifetime cumulative dose history, if applicable  The patient's medication allergies and previous infusion related reactions, if applicable   Changes made to treatment plan:  N/A  Follow up needed:  N/A   Nicholas Camacho, RPH, 10/27/2024  2:14 PM

## 2024-10-29 ENCOUNTER — Other Ambulatory Visit: Payer: Self-pay

## 2024-10-30 ENCOUNTER — Inpatient Hospital Stay

## 2024-11-02 ENCOUNTER — Inpatient Hospital Stay

## 2024-11-03 ENCOUNTER — Inpatient Hospital Stay

## 2024-11-03 ENCOUNTER — Inpatient Hospital Stay: Attending: Internal Medicine

## 2024-11-03 VITALS — BP 123/77 | HR 63 | Temp 97.6°F | Resp 20 | Wt 193.0 lb

## 2024-11-03 DIAGNOSIS — C3431 Malignant neoplasm of lower lobe, right bronchus or lung: Secondary | ICD-10-CM

## 2024-11-03 LAB — CMP (CANCER CENTER ONLY)
ALT: 27 U/L (ref 0–44)
AST: 29 U/L (ref 15–41)
Albumin: 4 g/dL (ref 3.5–5.0)
Alkaline Phosphatase: 155 U/L — ABNORMAL HIGH (ref 38–126)
Anion gap: 13 (ref 5–15)
BUN: 20 mg/dL (ref 8–23)
CO2: 25 mmol/L (ref 22–32)
Calcium: 9.9 mg/dL (ref 8.9–10.3)
Chloride: 105 mmol/L (ref 98–111)
Creatinine: 1.06 mg/dL (ref 0.61–1.24)
GFR, Estimated: >60 mL/min
Glucose, Bld: 154 mg/dL — ABNORMAL HIGH (ref 70–99)
Potassium: 3.8 mmol/L (ref 3.5–5.1)
Sodium: 143 mmol/L (ref 135–145)
Total Bilirubin: 0.4 mg/dL (ref 0.0–1.2)
Total Protein: 6.8 g/dL (ref 6.5–8.1)

## 2024-11-03 LAB — CBC WITH DIFFERENTIAL (CANCER CENTER ONLY)
Abs Immature Granulocytes: 0.02 10*3/uL (ref 0.00–0.07)
Basophils Absolute: 0 10*3/uL (ref 0.0–0.1)
Basophils Relative: 0 %
Eosinophils Absolute: 0 10*3/uL (ref 0.0–0.5)
Eosinophils Relative: 0 %
HCT: 42.1 % (ref 39.0–52.0)
Hemoglobin: 13.2 g/dL (ref 13.0–17.0)
Immature Granulocytes: 0 %
Lymphocytes Relative: 16 %
Lymphs Abs: 1.2 10*3/uL (ref 0.7–4.0)
MCH: 26.8 pg (ref 26.0–34.0)
MCHC: 31.4 g/dL (ref 30.0–36.0)
MCV: 85.4 fL (ref 80.0–100.0)
Monocytes Absolute: 0.6 10*3/uL (ref 0.1–1.0)
Monocytes Relative: 8 %
Neutro Abs: 5.5 10*3/uL (ref 1.7–7.7)
Neutrophils Relative %: 76 %
Platelet Count: 274 10*3/uL (ref 150–400)
RBC: 4.93 MIL/uL (ref 4.22–5.81)
RDW: 16.6 % — ABNORMAL HIGH (ref 11.5–15.5)
WBC Count: 7.4 10*3/uL (ref 4.0–10.5)
nRBC: 0 % (ref 0.0–0.2)

## 2024-11-03 LAB — T4, FREE: Free T4: 1.09 ng/dL (ref 0.80–2.00)

## 2024-11-03 LAB — TSH: TSH: 6.46 u[IU]/mL — ABNORMAL HIGH (ref 0.350–4.500)

## 2024-11-03 MED ORDER — DEXAMETHASONE SOD PHOSPHATE PF 10 MG/ML IJ SOLN
10.0000 mg | Freq: Once | INTRAMUSCULAR | Status: AC
  Administered 2024-11-03: 10 mg via INTRAVENOUS
  Filled 2024-11-03: qty 1

## 2024-11-03 MED ORDER — SODIUM CHLORIDE 0.9% FLUSH: 10.0000 mL | INTRAVENOUS | Status: DC | PRN

## 2024-11-03 MED ORDER — SODIUM CHLORIDE 0.9 % IV SOLN
360.0000 mg | Freq: Once | INTRAVENOUS | Status: AC
  Administered 2024-11-03: 360 mg via INTRAVENOUS
  Filled 2024-11-03: qty 12

## 2024-11-03 MED ORDER — SODIUM CHLORIDE 0.9 % IV SOLN: INTRAVENOUS | Status: DC

## 2024-11-03 MED ORDER — SODIUM CHLORIDE 0.9 % IV SOLN
150.0000 mg | Freq: Once | INTRAVENOUS | Status: AC
  Administered 2024-11-03: 150 mg via INTRAVENOUS
  Filled 2024-11-03: qty 150

## 2024-11-03 MED ORDER — PALONOSETRON HCL INJECTION 0.25 MG/5ML
0.2500 mg | Freq: Once | INTRAVENOUS | Status: AC
  Administered 2024-11-03: 0.25 mg via INTRAVENOUS
  Filled 2024-11-03: qty 5

## 2024-11-03 MED ORDER — FAMOTIDINE IN NACL 20-0.9 MG/50ML-% IV SOLN
20.0000 mg | Freq: Once | INTRAVENOUS | Status: AC
  Administered 2024-11-03: 20 mg via INTRAVENOUS
  Filled 2024-11-03: qty 50

## 2024-11-03 MED ORDER — SODIUM CHLORIDE 0.9 % IV SOLN
200.0000 mg/m2 | Freq: Once | INTRAVENOUS | Status: AC
  Administered 2024-11-03: 408 mg via INTRAVENOUS
  Filled 2024-11-03: qty 68

## 2024-11-03 MED ORDER — DIPHENHYDRAMINE HCL 50 MG/ML IJ SOLN
50.0000 mg | Freq: Once | INTRAMUSCULAR | Status: AC
  Administered 2024-11-03: 50 mg via INTRAVENOUS
  Filled 2024-11-03: qty 1

## 2024-11-03 MED ORDER — SODIUM CHLORIDE 0.9 % IV SOLN
608.4000 mg | Freq: Once | INTRAVENOUS | Status: AC
  Administered 2024-11-03: 610 mg via INTRAVENOUS
  Filled 2024-11-03: qty 61

## 2024-11-05 ENCOUNTER — Inpatient Hospital Stay

## 2024-11-05 VITALS — BP 127/85 | HR 69 | Temp 97.8°F | Resp 18

## 2024-11-05 DIAGNOSIS — C3431 Malignant neoplasm of lower lobe, right bronchus or lung: Secondary | ICD-10-CM

## 2024-11-05 MED ORDER — PEGFILGRASTIM-JMDB 6 MG/0.6ML ~~LOC~~ SOSY
6.0000 mg | PREFILLED_SYRINGE | Freq: Once | SUBCUTANEOUS | Status: AC
  Administered 2024-11-05: 6 mg via SUBCUTANEOUS
  Filled 2024-11-05: qty 0.6

## 2024-11-06 NOTE — Progress Notes (Signed)
 Nicholas Camacho                                          MRN: [id number]   11/06/2024   The [facility] Quality Team Specialist reviewed this patient medical record for the purposes of chart review for care gap closure. The following were reviewed: chart review for care gap closure-kidney health evaluation for diabetes:eGFR  and uACR.    [facility] Quality Team

## 2024-11-11 ENCOUNTER — Inpatient Hospital Stay: Admitting: Physician Assistant

## 2024-11-11 ENCOUNTER — Inpatient Hospital Stay

## 2024-11-18 ENCOUNTER — Inpatient Hospital Stay

## 2024-11-26 ENCOUNTER — Inpatient Hospital Stay

## 2024-11-26 ENCOUNTER — Inpatient Hospital Stay: Admitting: Physician Assistant

## 2024-11-28 ENCOUNTER — Inpatient Hospital Stay

## 2024-12-02 ENCOUNTER — Inpatient Hospital Stay: Attending: Internal Medicine

## 2024-12-09 ENCOUNTER — Inpatient Hospital Stay

## 2024-12-16 ENCOUNTER — Inpatient Hospital Stay

## 2024-12-16 ENCOUNTER — Inpatient Hospital Stay: Admitting: Internal Medicine

## 2024-12-18 ENCOUNTER — Inpatient Hospital Stay

## 2024-12-23 ENCOUNTER — Inpatient Hospital Stay
